# Patient Record
Sex: Female | Born: 1950 | Race: White | Hispanic: No | Smoking: Former smoker
Health system: Southern US, Community
[De-identification: ages and names within clinical notes are randomized; demographics above are authoritative.]

## PROBLEM LIST (undated history)

## (undated) DIAGNOSIS — B019 Varicella without complication: Secondary | ICD-10-CM

## (undated) DIAGNOSIS — N289 Disorder of kidney and ureter, unspecified: Secondary | ICD-10-CM

## (undated) DIAGNOSIS — F32A Depression, unspecified: Secondary | ICD-10-CM

## (undated) DIAGNOSIS — I480 Paroxysmal atrial fibrillation: Secondary | ICD-10-CM

## (undated) DIAGNOSIS — I10 Essential (primary) hypertension: Secondary | ICD-10-CM

## (undated) DIAGNOSIS — F419 Anxiety disorder, unspecified: Secondary | ICD-10-CM

## (undated) DIAGNOSIS — F319 Bipolar disorder, unspecified: Secondary | ICD-10-CM

## (undated) DIAGNOSIS — Z923 Personal history of irradiation: Secondary | ICD-10-CM

## (undated) HISTORY — DX: Varicella without complication: B01.9

## (undated) HISTORY — DX: Paroxysmal atrial fibrillation: I48.0

## (undated) HISTORY — DX: Essential (primary) hypertension: I10

## (undated) HISTORY — DX: Disorder of kidney and ureter, unspecified: N28.9

## (undated) HISTORY — DX: Bipolar disorder, unspecified: F31.9

## (undated) HISTORY — DX: Depression, unspecified: F32.A

---

## 1973-11-27 HISTORY — PX: TONSILLECTOMY AND ADENOIDECTOMY: SUR1326

## 2000-01-20 ENCOUNTER — Encounter: Payer: Self-pay | Admitting: Family Medicine

## 2000-01-20 ENCOUNTER — Encounter: Admission: RE | Admit: 2000-01-20 | Discharge: 2000-01-20 | Payer: Self-pay | Admitting: Family Medicine

## 2000-06-11 ENCOUNTER — Encounter: Payer: Self-pay | Admitting: Urology

## 2000-06-11 ENCOUNTER — Ambulatory Visit (HOSPITAL_COMMUNITY): Admission: RE | Admit: 2000-06-11 | Discharge: 2000-06-11 | Payer: Self-pay | Admitting: Urology

## 2000-07-06 ENCOUNTER — Other Ambulatory Visit: Admission: RE | Admit: 2000-07-06 | Discharge: 2000-07-06 | Payer: Self-pay | Admitting: Family Medicine

## 2000-07-16 ENCOUNTER — Encounter: Admission: RE | Admit: 2000-07-16 | Discharge: 2000-07-16 | Payer: Self-pay | Admitting: Family Medicine

## 2000-07-16 ENCOUNTER — Encounter: Payer: Self-pay | Admitting: Family Medicine

## 2001-02-11 ENCOUNTER — Encounter: Admission: RE | Admit: 2001-02-11 | Discharge: 2001-02-26 | Payer: Self-pay | Admitting: Occupational Medicine

## 2001-02-17 ENCOUNTER — Ambulatory Visit (HOSPITAL_COMMUNITY): Admission: RE | Admit: 2001-02-17 | Discharge: 2001-02-17 | Payer: Self-pay | Admitting: Neurosurgery

## 2001-02-17 ENCOUNTER — Encounter: Payer: Self-pay | Admitting: Neurosurgery

## 2001-03-11 ENCOUNTER — Encounter: Payer: Self-pay | Admitting: Neurosurgery

## 2001-03-11 ENCOUNTER — Encounter: Admission: RE | Admit: 2001-03-11 | Discharge: 2001-03-11 | Payer: Self-pay | Admitting: Neurosurgery

## 2001-03-27 ENCOUNTER — Encounter: Payer: Self-pay | Admitting: Neurosurgery

## 2001-03-27 ENCOUNTER — Encounter: Admission: RE | Admit: 2001-03-27 | Discharge: 2001-03-27 | Payer: Self-pay | Admitting: Neurosurgery

## 2001-05-21 ENCOUNTER — Encounter: Payer: Self-pay | Admitting: Neurosurgery

## 2001-05-21 ENCOUNTER — Encounter: Admission: RE | Admit: 2001-05-21 | Discharge: 2001-05-21 | Payer: Self-pay | Admitting: Neurosurgery

## 2001-09-20 ENCOUNTER — Other Ambulatory Visit: Admission: RE | Admit: 2001-09-20 | Discharge: 2001-09-20 | Payer: Self-pay | Admitting: Family Medicine

## 2001-10-01 ENCOUNTER — Encounter: Admission: RE | Admit: 2001-10-01 | Discharge: 2001-10-01 | Payer: Self-pay | Admitting: Family Medicine

## 2001-10-01 ENCOUNTER — Encounter: Payer: Self-pay | Admitting: Family Medicine

## 2003-03-03 ENCOUNTER — Encounter: Payer: Self-pay | Admitting: Neurosurgery

## 2003-03-03 ENCOUNTER — Encounter: Admission: RE | Admit: 2003-03-03 | Discharge: 2003-03-03 | Payer: Self-pay | Admitting: Neurosurgery

## 2003-11-18 ENCOUNTER — Encounter: Admission: RE | Admit: 2003-11-18 | Discharge: 2003-11-18 | Payer: Self-pay | Admitting: Neurosurgery

## 2004-05-22 ENCOUNTER — Ambulatory Visit (HOSPITAL_COMMUNITY): Admission: RE | Admit: 2004-05-22 | Discharge: 2004-05-22 | Payer: Self-pay | Admitting: Neurosurgery

## 2004-05-27 ENCOUNTER — Ambulatory Visit (HOSPITAL_COMMUNITY): Admission: RE | Admit: 2004-05-27 | Discharge: 2004-05-27 | Payer: Self-pay | Admitting: Neurosurgery

## 2004-10-31 ENCOUNTER — Ambulatory Visit (HOSPITAL_BASED_OUTPATIENT_CLINIC_OR_DEPARTMENT_OTHER): Admission: RE | Admit: 2004-10-31 | Discharge: 2004-10-31 | Payer: Self-pay | Admitting: Podiatry

## 2005-11-27 HISTORY — PX: BUNIONECTOMY: SHX129

## 2006-06-04 ENCOUNTER — Ambulatory Visit: Payer: Self-pay | Admitting: Internal Medicine

## 2006-06-21 ENCOUNTER — Ambulatory Visit: Payer: Self-pay | Admitting: Internal Medicine

## 2007-03-07 ENCOUNTER — Encounter: Admission: RE | Admit: 2007-03-07 | Discharge: 2007-03-07 | Payer: Self-pay | Admitting: Neurosurgery

## 2013-06-17 ENCOUNTER — Ambulatory Visit (INDEPENDENT_AMBULATORY_CARE_PROVIDER_SITE_OTHER): Payer: BC Managed Care – PPO | Admitting: Family Medicine

## 2013-06-17 ENCOUNTER — Encounter: Payer: Self-pay | Admitting: Family Medicine

## 2013-06-17 VITALS — BP 124/82 | Temp 98.1°F | Ht 63.75 in | Wt 164.0 lb

## 2013-06-17 DIAGNOSIS — Z7689 Persons encountering health services in other specified circumstances: Secondary | ICD-10-CM

## 2013-06-17 DIAGNOSIS — Z7189 Other specified counseling: Secondary | ICD-10-CM

## 2013-06-17 DIAGNOSIS — F319 Bipolar disorder, unspecified: Secondary | ICD-10-CM

## 2013-06-17 DIAGNOSIS — Z23 Encounter for immunization: Secondary | ICD-10-CM

## 2013-06-17 DIAGNOSIS — S83209A Unspecified tear of unspecified meniscus, current injury, unspecified knee, initial encounter: Secondary | ICD-10-CM

## 2013-06-17 DIAGNOSIS — Z1211 Encounter for screening for malignant neoplasm of colon: Secondary | ICD-10-CM

## 2013-06-17 DIAGNOSIS — R1900 Intra-abdominal and pelvic swelling, mass and lump, unspecified site: Secondary | ICD-10-CM

## 2013-06-17 DIAGNOSIS — IMO0002 Reserved for concepts with insufficient information to code with codable children: Secondary | ICD-10-CM

## 2013-06-17 NOTE — Patient Instructions (Signed)
-  We have ordered labs or studies at this visit. It can take up to 1-2 weeks for results and processing. We will contact you with instructions IF your results are abnormal. Normal results will be released to your [facility]. If you have not heard from [place] or can not find your results in [facility] in 2 weeks please contact our office.  -PLEASE SIGN UP FOR MYCHART TODAY   We recommend the following healthy lifestyle measures: - eat a healthy diet consisting of lots of vegetables, fruits, beans, nuts, seeds, healthy meats such as white chicken and fish and whole grains.  - avoid fried foods, fast food, processed foods, sodas, red meet and other fattening foods.  - get a least 150 minutes of aerobic exercise per week.   -We placed a referral for you as discussed for your colonoscopy. It usually takes about 1-2 weeks to process and schedule this referral. If you have not heard from [place] regarding this appointment in 2 weeks please contact our office.  Please call [phone] to schedule your mammogram  Follow up in: next 1-2 months for a physical exam, pap smear, breast exam and fasting labs

## 2013-06-17 NOTE — Progress Notes (Signed)
Chief Complaint  Patient presents with  . Establish Care  . mass in left lower quadrant    HPI:  Margaret Mckenzie is here to establish care. No insurance for several years so lost touch with prior PCP. She has a history of HTN but this resolved with exercise and smoking cessation. Remote hx of kidney stones requiring lithotripsy. Psych issues follow by Dr. Tomasa Rand. Last PCP and physical: 2009   Has the following chronic problems and concerns today:  Bipolar Disorder: -follow by Dr. Tomasa Rand with [facility] whom prescribes all of her pscyh medications and checks her labs -on depakote, lamictal, sonata and xanax - had recent CBC and LFT all ok on 04/11/14 -she has been very stable and is aware of potentially serious interactions regarding her medication - she take sleep medication and xanax rarely -no pscyh hospitalizations or suicidal attempts in the past  R Medial Meniscal tear: -followed by [facility] -considering surgery  Bump in LLQ: -has had this for 4-5 years -small bump in LLQ a few times a week - disappears when pushes on it -has not changed, does not hurt -denies: abd pain, weight loss, fevers, bowel changes, diarrhea, melena, hematochezia, hernia that she is aware of or abd surgeries -she is not worried about it  There are no active problems to display for this patient.  Health Maintenance: -postmenopausal, no bleeding -needs health maintenance exam -had colonoscopy 10 years ago -out of date on pap and mammos -needs tetanus today -she wants to check on shingles cost  ROS: See pertinent positives and negatives per HPI.  Past Medical History  Diagnosis Date  . Chicken pox   . Depression   . Arrhythmia   . Hypertension     high blood pressure readings   . Bipolar disorder   . Kidney disease     kidney stones and saw urologist, Dr. Dillard Cannon for lithotripsy remotely     Family History  Problem Relation Age of Onset  . Osteoporosis Mother    . Stroke Mother     TIA, [place]  . Mental illness Mother   . Hypertension Father   . Heart disease Father 58  . Stroke Father 56  . Mental illness Sister     History   Social History  . Marital Status: Married    Spouse Name: N/A    Number of Children: N/A  . Years of Education: N/A   Social History Main Topics  . Smoking status: Former Smoker    Types: Cigarettes    Quit date: 04/09/2012  . Smokeless tobacco: None  . Alcohol Use: Yes     Comment: 1 glass of wine a night   . Drug Use: None  . Sexually Active: None   Other Topics Concern  . 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 Take 1?2 to 1 tablet by mouth twice daily as needed., Disp: , Rfl: ;  divalproex (DEPAKOTE) 500 MG DR tablet, Take 500 mg by mouth. Take 4 tablets by mouth twice daily., Disp: , Rfl: ;  lamoTRIgine (LAMICTAL) 150 MG tablet, Take 150 mg  by mouth at bedtime., Disp: , Rfl:  zaleplon (SONATA) 10 MG capsule, Take 10 mg by mouth. Take 1/2 to 1 capsule by mouth daily at bedtime., Disp: , Rfl:   EXAM:  Filed Vitals:   06/17/13 1425  BP: 124/82  Temp: 98.1 F (36.7 C)    Body mass index is 28.38 kg/(m^2).  GENERAL: vitals reviewed and listed above, alert, oriented, appears well hydrated and in no acute distress  HEENT: atraumatic, conjunttiva clear, no obvious abnormalities on inspection of external nose and ears  NECK: no obvious masses on inspection  LUNGS: clear to auscultation bilaterally, no wheezes, rales or rhonchi, good air movement  CV: HRRR, no peripheral edema  ABD: BS+, soft, NTTP, no appreciable masses or hernia  MS: moves all extremities without noticeable  abnormality  PSYCH: pleasant and cooperative, no obvious depression or anxiety  ASSESSMENT AND PLAN:  Discussed the following assessment and plan:  Need for prophylactic vaccination with combined diphtheria-tetanus-pertussis (DTP) vaccine - Plan: Tdap vaccine greater than or equal to 7yo IM  Bipolar disorder, unspecified - managed by Dr. Tomasa Rand  meniscal tear - followed by [facility]  Lump in the abdomen  Encounter to establish care  Colon cancer screening - Plan: Ambulatory referral to Gastroenterology  -We reviewed the PMH, PSH, FH, SH, Meds and Allergies. -We provided refills for any medications we will prescribe as needed. -We addressed current concerns per orders and patient instructions. -We have asked for records for pertinent exams, studies, vaccines and notes from previous providers. -We have advised patient to follow up per instructions below. -tdap vaccine given today -reviewed labs and placed in scan box - will do lipids and diabetes screening at physical per pt wishes -discuss lump in abd - sounds like possible hernia but nothing found on exam, advised referral to surgery to evaluate - she prefers to hold off on this for now and will let us know if desires to pursue further, return and emergency precautions discussed -referred for colonoscopy -follow up for physical, pap, labs  45 minutes spent with this patient  -Patient advised to return or notify a doctor immediately if symptoms worsen or persist or new concerns arise.  Patient Instructions  -We have ordered labs or studies at this visit. It can take up to 1-2 weeks for results and processing. We will contact you with instructions IF your results are abnormal. Normal results will be released to your [facility]. If you have not heard from [place] or can not find your results in [facility] in 2 weeks please contact our office.  -PLEASE SIGN UP FOR MYCHART TODAY   We recommend the following healthy lifestyle  measures: - eat a healthy diet consisting of lots of vegetables, fruits, beans, nuts, seeds, healthy meats such as white chicken and fish and whole grains.  - avoid fried foods, fast food, processed foods, sodas, red meet and other fattening foods.  - get a least 150 minutes of aerobic exercise per week.   -We placed a referral for you as discussed for your colonoscopy. It usually takes about 1-2 weeks to process and schedule this referral. If you have not heard from [place] regarding this appointment in 2 weeks please contact our office.  Please call [phone] to schedule your mammogram  Follow up in: next 1-2 months for a physical exam, pap smear, breast exam and fasting labs      KIM, HANNAH R.

## 2013-06-23 ENCOUNTER — Encounter: Payer: Self-pay | Admitting: Family Medicine

## 2013-06-23 NOTE — Progress Notes (Signed)
Some records including normal colonoscopy report from 2007 received. Placed in scan box.

## 2013-07-03 ENCOUNTER — Encounter: Payer: Self-pay | Admitting: Internal Medicine

## 2013-07-09 ENCOUNTER — Other Ambulatory Visit: Payer: Self-pay

## 2013-07-09 DIAGNOSIS — Z1231 Encounter for screening mammogram for malignant neoplasm of breast: Secondary | ICD-10-CM

## 2013-07-30 ENCOUNTER — Ambulatory Visit
Admission: RE | Admit: 2013-07-30 | Discharge: 2013-07-30 | Disposition: A | Discharge status: Home / Self Care | Payer: BC Managed Care – PPO | Source: Ambulatory Visit

## 2013-07-30 DIAGNOSIS — Z1231 Encounter for screening mammogram for malignant neoplasm of breast: Secondary | ICD-10-CM

## 2013-08-04 ENCOUNTER — Other Ambulatory Visit (HOSPITAL_COMMUNITY)
Admission: RE | Admit: 2013-08-04 | Discharge: 2013-08-04 | Disposition: A | Discharge status: Home / Self Care | Payer: BC Managed Care – PPO | Source: Ambulatory Visit | Attending: Family Medicine | Admitting: Family Medicine

## 2013-08-04 ENCOUNTER — Encounter: Payer: Self-pay | Admitting: Family Medicine

## 2013-08-04 ENCOUNTER — Ambulatory Visit (INDEPENDENT_AMBULATORY_CARE_PROVIDER_SITE_OTHER): Payer: BC Managed Care – PPO | Admitting: Family Medicine

## 2013-08-04 ENCOUNTER — Other Ambulatory Visit: Payer: Self-pay | Admitting: Family Medicine

## 2013-08-04 ENCOUNTER — Ambulatory Visit (INDEPENDENT_AMBULATORY_CARE_PROVIDER_SITE_OTHER): Payer: BC Managed Care – PPO

## 2013-08-04 VITALS — BP 120/88 | Temp 98.5°F | Ht 63.75 in | Wt 163.0 lb

## 2013-08-04 DIAGNOSIS — Z1151 Encounter for screening for human papillomavirus (HPV): Secondary | ICD-10-CM | POA: Insufficient documentation

## 2013-08-04 DIAGNOSIS — Z0000 Encounter for general adult medical examination without abnormal findings: Secondary | ICD-10-CM

## 2013-08-04 DIAGNOSIS — M25512 Pain in left shoulder: Secondary | ICD-10-CM

## 2013-08-04 DIAGNOSIS — F319 Bipolar disorder, unspecified: Secondary | ICD-10-CM

## 2013-08-04 DIAGNOSIS — Z01419 Encounter for gynecological examination (general) (routine) without abnormal findings: Secondary | ICD-10-CM | POA: Insufficient documentation

## 2013-08-04 DIAGNOSIS — Z23 Encounter for immunization: Secondary | ICD-10-CM

## 2013-08-04 DIAGNOSIS — M25519 Pain in unspecified shoulder: Secondary | ICD-10-CM

## 2013-08-04 DIAGNOSIS — R928 Other abnormal and inconclusive findings on diagnostic imaging of breast: Secondary | ICD-10-CM

## 2013-08-04 LAB — LDL CHOLESTEROL, DIRECT: Direct LDL: 58.8 mg/dL

## 2013-08-04 LAB — LIPID PANEL
Cholesterol: 168 mg/dL (ref 0–200)
HDL: 70.9 mg/dL (ref >39.00)
Total CHOL/HDL Ratio: 2
Triglycerides: 343 mg/dL — ABNORMAL HIGH (ref 0.0–149.0)
VLDL: 68.6 mg/dL — ABNORMAL HIGH (ref 0.0–40.0)

## 2013-08-04 LAB — HEMOGLOBIN A1C: Hgb A1c MFr Bld: 5.4 % (ref 4.6–6.5)

## 2013-08-04 NOTE — Progress Notes (Signed)
Chief Complaint  Patient presents with  . Annual Exam    HPI:  Here for CPE:  -Concerns today:  1)PSych: bipolar -followed by pscyh - had labs in May-stable  2)lump in abd: -intermittent, likely hernia - advised surgery eval but she decided to hold off on this -has occ then disappears - again refuses referral  3)? Reaction to tetanus shot: -had quite a bit of soreness after shot in shoulder, now improved -pain in acromio-clavicular jt now - denies weakness, numbness, radiation, fevers, chills  -Diet: variety of foods, balance and well rounded, larger portion sizes  -calcium/vitamin D: no  -Exercise: no regular exercise - seeing Dr. Farris Has, doing PT  -Diabetes and Dyslipidemia Screening: fasting today for labs  -Hx of HTN: no  -Vaccines: needs flu vaccine  -pap history: has been years  - will do today, never had abnormal pap  -FDLMP: postmenopausal, no bleeding  -sexual activity: no  -wants STI testing, Hep C screening: no  -FH breast, colon or ovarian ca: see FH -referred for colonoscopy last visit - had in 2007, due in 2017 -had mammo 07/09/13 with birads 0 - recalled for additional imaging - breast center contacted patient and she is set up for additional images per her report  -Alcohol, Tobacco, drug use: see social history  Review of Systems - Review of Systems  Constitutional: Negative for fever, weight loss and malaise/fatigue.  HENT: Negative for hearing loss.   Eyes: Negative for blurred vision.  Respiratory: Negative for cough and shortness of breath.   Cardiovascular: Negative for chest pain, palpitations and leg swelling.  Skin: Negative for rash.  GU: no bleeding or dysuria GI: no change in bowels or melena or blood in stools -PSych: stable, no SI   Past Medical History  Diagnosis Date  . Chicken pox   . Depression   . Arrhythmia   . Hypertension     high blood pressure readings   . Bipolar disorder   . Kidney disease     kidney stones  and saw urologist, Dr. Dillard Cannon for lithotripsy remotely     Past Surgical History  Procedure Laterality Date  . Tonsillectomy and adenoidectomy  1975  . Bunionectomy  2007    Family History  Problem Relation Age of Onset  . Osteoporosis Mother   . Stroke Mother     TIA, [place]  . Mental illness Mother   . Hypertension Father   . Heart disease Father 61  . Stroke Father 35  . Mental illness Sister     History   Social History  . Marital Status: Married    Spouse Name: N/A    Number of Children: N/A  . Years of Education: N/A   Social History Main Topics  . Smoking status: Former Smoker    Types: Cigarettes    Quit date: 04/09/2012  . Smokeless tobacco: None  . Alcohol Use: Yes     Comment: 1 glass of wine a night   . Drug Use: None  . Sexual Activity: None   Other Topics Concern  . 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 Take 1?2 to 1 tablet by mouth twice daily as needed., Disp: , Rfl: ;  divalproex (DEPAKOTE) 500 MG DR tablet, Take 500 mg by mouth. Take 4 tablets by mouth twice daily., Disp: , Rfl: ;  lamoTRIgine (LAMICTAL) 150 MG tablet, Take 150 mg by mouth at bedtime., Disp: , Rfl:  zaleplon (SONATA) 10 MG capsule, Take 10 mg by mouth. Take 1/2 to 1 capsule by mouth daily at bedtime., Disp: , Rfl:   EXAM:  Filed Vitals:   08/04/13 0933  BP: 120/88  Temp: 98.5 F (36.9 C)    GENERAL: vitals reviewed and listed below, alert, oriented, appears well hydrated and in no acute distress  HEENT: head atraumatic, PERRLA, normal appearance of eyes, ears, nose and mouth. moist mucus membranes.  NECK: supple, no masses or  lymphadenopathy  LUNGS: clear to auscultation bilaterally, no rales, rhonchi or wheeze  CV: HRRR, no peripheral edema or cyanosis, normal pedal pulses  BREAST: refused  ABDOMEN: bowel sounds normal, soft, non tender to palpation, no masses, no rebound or guarding  GU: normal appearance of external genitalia - no lesions or masses, normal vaginal mucosa - no abnormal discharge, normal appearance of cervix - no lesions or abnormal discharge, no masses or tenderness on palpation of uterus and ovaries.  RECTAL: refused  SKIN: no rash or abnormal lesions  MS: normal gait, moves all extremities normally - TTP L AC joint, + shawl sign ortherwise normal shoulder exam  NEURO: CN II-XII grossly intact, normal muscle strength and sensation to light touch on extremities  PSYCH: normal affect, pleasant and cooperative  ASSESSMENT AND PLAN:  Discussed the following assessment and plan:  Visit for preventive health examination - Plan: Lipid panel, Hemoglobin A1c  Need for prophylactic vaccination and inoculation against influenza - Plan: Flu Vaccine QUAD 36+ mos PF IM (Fluarix)  Pain in joint, shoulder region, left -offered ortho referral, prefers conservative care since improving and will follow up if persists in 4 weeks  Bipolar disorder, unspecified   -Discussed and advised all [place] preventive services health task force level A and B recommendations for age, sex and risks.  -Advised at least 150 minutes of exercise per week and a healthy diet low in saturated fats and sweets and consisting of fresh fruits and vegetables, lean meats such as fish and white chicken and whole grains.  -labs, studies and vaccines per orders this encounter  Orders Placed This Encounter  Procedures  . Flu Vaccine QUAD 36+ mos PF IM (Fluarix)  . Lipid panel  . Hemoglobin A1c    Patient Instructions  -Vitamin D 1000 IU Vit 3 daily  -1200mg  daily total in diet and supplement  -check on cost of shingles  vaccine and set up nurse visit if you decide to do this  -get repeat mammogram   -gave you the flu vaccine today  -We have ordered labs or studies at this visit. It can take up to 1-2 weeks for results and processing. We will contact you with instructions IF your results are abnormal. Normal results will be released to your [facility]. If you have not heard from [place] or can not find your results in [facility] in 2 weeks please contact our office.  -We recommend the following healthy lifestyle measures: - eat a healthy diet consisting of lots of vegetables, fruits, beans, nuts, seeds, healthy meats such as white chicken and fish and whole grains.  - avoid fried foods, fast food, processed foods, sodas, red meet and  other fattening foods.  - get a least 150 minutes of aerobic exercise per week.   Follow up in 6 months or as needed           Patient advised to return to clinic immediately if symptoms worsen or persist or new concerns.  @LIFEPLAN @  Return in about 6 months (around 02/01/2014).  Kriste Basque R.

## 2013-08-04 NOTE — Patient Instructions (Addendum)
-  Vitamin D 1000 IU Vit 3 daily  -1200mg  daily total in diet and supplement  -check on cost of shingles vaccine and set up nurse visit if you decide to do this  -get repeat mammogram   -gave you the flu vaccine today  -We have ordered labs or studies at this visit. It can take up to 1-2 weeks for results and processing. We will contact you with instructions IF your results are abnormal. Normal results will be released to your [facility]. If you have not heard from [place] or can not find your results in [facility] in 2 weeks please contact our office.  -We recommend the following healthy lifestyle measures: - eat a healthy diet consisting of lots of vegetables, fruits, beans, nuts, seeds, healthy meats such as white chicken and fish and whole grains.  - avoid fried foods, fast food, processed foods, sodas, red meet and other fattening foods.  - get a least 150 minutes of aerobic exercise per week.   Follow up in 6 months or as needed

## 2013-08-04 NOTE — Progress Notes (Signed)
Quick Note:  Called and spoke with pt and pt is aware. ______ 

## 2013-08-04 NOTE — Progress Notes (Signed)
Quick Note:  Left a message for pt to return call. ______ 

## 2013-08-06 NOTE — Progress Notes (Signed)
Quick Note:  Left a message for pt at desiganted cell phone number. ______

## 2013-08-11 ENCOUNTER — Ambulatory Visit
Admission: RE | Admit: 2013-08-11 | Discharge: 2013-08-11 | Disposition: A | Discharge status: Home / Self Care | Payer: BC Managed Care – PPO | Source: Ambulatory Visit | Attending: Family Medicine | Admitting: Family Medicine

## 2013-08-11 DIAGNOSIS — R928 Other abnormal and inconclusive findings on diagnostic imaging of breast: Secondary | ICD-10-CM

## 2013-08-26 NOTE — Progress Notes (Signed)
Quick Note:  Left a message for return call. ______ 

## 2013-08-28 NOTE — Progress Notes (Signed)
Left a message for pt to return call 

## 2013-09-01 NOTE — Progress Notes (Signed)
Quick Note:  Called and spoke with pt and pt states she is set up for both! ______

## 2016-06-27 ENCOUNTER — Encounter: Payer: Self-pay | Admitting: Internal Medicine

## 2017-06-04 DIAGNOSIS — Z79899 Other long term (current) drug therapy: Secondary | ICD-10-CM | POA: Diagnosis not present

## 2017-06-04 LAB — BASIC METABOLIC PANEL
BUN: 15 (ref 4–21)
Creatinine: 0.7 (ref <=1.1)
Glucose: 92
Potassium: 4.4 (ref 3.4–5.3)
Sodium: 141 (ref 137–147)

## 2017-06-04 LAB — LIPID PANEL
Cholesterol: 220 — AB (ref 0–200)
HDL: 112 — AB (ref 35–70)
LDL Cholesterol: 92
Triglycerides: 82 (ref 40–160)

## 2017-06-04 LAB — HEMOGLOBIN A1C: Hemoglobin A1C: 5

## 2017-06-11 ENCOUNTER — Ambulatory Visit: Payer: Self-pay | Admitting: Family Medicine

## 2017-06-11 ENCOUNTER — Emergency Department (HOSPITAL_COMMUNITY)
Admission: EM | Admit: 2017-06-11 | Discharge: 2017-06-11 | Disposition: A | Discharge status: Home / Self Care | Payer: PPO | Attending: Emergency Medicine | Admitting: Emergency Medicine

## 2017-06-11 ENCOUNTER — Ambulatory Visit (INDEPENDENT_AMBULATORY_CARE_PROVIDER_SITE_OTHER): Payer: PPO | Admitting: Family Medicine

## 2017-06-11 ENCOUNTER — Encounter: Payer: Self-pay | Admitting: Family Medicine

## 2017-06-11 VITALS — BP 116/80 | HR 121 | Temp 98.6°F | Ht 64.0 in | Wt 138.1 lb

## 2017-06-11 DIAGNOSIS — R42 Dizziness and giddiness: Secondary | ICD-10-CM | POA: Diagnosis not present

## 2017-06-11 DIAGNOSIS — I10 Essential (primary) hypertension: Secondary | ICD-10-CM | POA: Insufficient documentation

## 2017-06-11 DIAGNOSIS — I4892 Unspecified atrial flutter: Secondary | ICD-10-CM | POA: Insufficient documentation

## 2017-06-11 DIAGNOSIS — R002 Palpitations: Secondary | ICD-10-CM | POA: Diagnosis not present

## 2017-06-11 DIAGNOSIS — Z716 Tobacco abuse counseling: Secondary | ICD-10-CM

## 2017-06-11 DIAGNOSIS — R009 Unspecified abnormalities of heart beat: Secondary | ICD-10-CM | POA: Diagnosis present

## 2017-06-11 DIAGNOSIS — F17210 Nicotine dependence, cigarettes, uncomplicated: Secondary | ICD-10-CM | POA: Diagnosis not present

## 2017-06-11 DIAGNOSIS — Z79899 Other long term (current) drug therapy: Secondary | ICD-10-CM | POA: Insufficient documentation

## 2017-06-11 DIAGNOSIS — I499 Cardiac arrhythmia, unspecified: Secondary | ICD-10-CM

## 2017-06-11 LAB — CBC
HCT: 44.6 % (ref 36.0–46.0)
Hemoglobin: 15.1 g/dL — ABNORMAL HIGH (ref 12.0–15.0)
MCH: 30.8 pg (ref 26.0–34.0)
MCHC: 33.9 g/dL (ref 30.0–36.0)
MCV: 90.8 fL (ref 78.0–100.0)
Platelets: 221 10*3/uL (ref 150–400)
RBC: 4.91 MIL/uL (ref 3.87–5.11)
RDW: 14.1 % (ref 11.5–15.5)
WBC: 7.9 10*3/uL (ref 4.0–10.5)

## 2017-06-11 LAB — BASIC METABOLIC PANEL
Anion gap: 10 (ref 5–15)
BUN: 13 mg/dL (ref 6–20)
CO2: 27 mmol/L (ref 22–32)
Calcium: 10.2 mg/dL (ref 8.9–10.3)
Chloride: 106 mmol/L (ref 101–111)
Creatinine, Ser: 0.73 mg/dL (ref 0.44–1.00)
GFR calc Af Amer: >60 mL/min (ref >60)
GFR calc non Af Amer: >60 mL/min (ref >60)
Glucose, Bld: 89 mg/dL (ref 65–99)
Potassium: 3.9 mmol/L (ref 3.5–5.1)
Sodium: 143 mmol/L (ref 135–145)

## 2017-06-11 LAB — MAGNESIUM: Magnesium: 2.2 mg/dL (ref 1.7–2.4)

## 2017-06-11 MED ORDER — APIXABAN 5 MG PO TABS: 5.0000 mg | ORAL_TABLET | Freq: Two times a day (BID) | ORAL | 0 refills | Status: DC

## 2017-06-11 NOTE — ED Provider Notes (Signed)
[facility] DEPT Provider Note   CSN: [id number] Arrival date & time: 06/11/17  1420     History   Chief Complaint Chief Complaint  Patient presents with  . Irregular Heart Beat    HPI Margaret Mckenzie is a 66 y.o. female.  HPI  66 year old female presents from her doctor's office with a new diagnosis of paroxysmal A. fib versus a flutter. Patient went to her PCP who she had not seen in 4 years for blood work and a checkup. She was noted to be currently in an irregularly irregular heart rate of around 120. Patient states that she can feel herself having these palpitations and this is been going on for about 3 months. Happens about once a week for about an hour at a time. Very rarely she has been dizzy during these episodes and otherwise has no symptoms such as chest pain, chest pressure, or shortness of breath. She otherwise has not been ill and has had no recent vomiting, diarrhea, weight loss, or neck pain or swelling. She is currently a symptomatically feels like she is out of A. Fib. She states she used to have high blood pressure but does not have anymore and has never had a history of CHF, stroke/TIA, diabetes, or vascular problems  Past Medical History:  Diagnosis Date  . Arrhythmia   . Bipolar 1 disorder ([place])   . Bipolar disorder ([place])   . Chicken pox   . Depression   . Hypertension    high blood pressure readings   . Kidney disease    kidney stones and saw urologist, Dr. Edward Qualia for lithotripsy remotely     There are no active problems to display for this patient.   Past Surgical History:  Procedure Laterality Date  . BUNIONECTOMY  2007  . TONSILLECTOMY AND ADENOIDECTOMY  1975    OB History    No data available       Home Medications    Prior to Admission medications   Medication Sig Start Date End Date Taking? Authorizing Provider  ALPRAZolam Duanne Moron) 0.25 MG tablet Take 0.25 mg by mouth daily as needed for anxiety or sleep.    Yes Rosamaria Lints, MD  lamoTRIgine (LAMICTAL) 150 MG tablet Take 300 mg by mouth at bedtime.    Yes Rosamaria Lints, MD  Magnesium 500 MG TABS Take 1 tablet by mouth at bedtime.   Yes [provider]  risperiDONE (RISPERDAL) 3 MG tablet Take 3 mg by mouth at bedtime.   Yes [provider]  zaleplon (SONATA) 10 MG capsule Take 10 mg by mouth at bedtime as needed for sleep.    Yes Rosamaria Lints, MD  apixaban (ELIQUIS) 5 MG TABS tablet Take 1 tablet (5 mg total) by mouth 2 (two) times daily. 06/11/17   Sherwood Gambler, MD    Family History Family History  Problem Relation Age of Onset  . Osteoporosis Mother   . Stroke Mother        TIA, stoke  . Mental retardation Mother   . Hypertension Father   . Heart disease Father 71  . Stroke Father 43  . Mental illness Sister     Social History Social History  Substance Use Topics  . Smoking status: Current Every Day Smoker    Packs/day: 0.50    Types: Cigarettes    Last attempt to quit: 04/09/2012  . Smokeless tobacco: Never Used     Comment: started smoking again 2 years  .  Alcohol use Yes     Comment: 1 glass of wine a night      Allergies   Patient has no known allergies.   Review of Systems Review of Systems  Constitutional: Negative for unexpected weight change.  Respiratory: Negative for shortness of breath.   Cardiovascular: Positive for palpitations. Negative for chest pain.  Gastrointestinal: Negative for diarrhea and vomiting.  Musculoskeletal: Negative for neck pain.  Neurological: Negative for dizziness.  All other systems reviewed and are negative.    Physical Exam Updated Vital Signs BP (!) 145/92 (BP Location: Left Arm)   Pulse 84   Temp 99.2 F (37.3 C) (Oral)   Resp 14   Ht 5\' 4"  (1.626 m)   Wt 62.1 kg (137 lb)   SpO2 97%   BMI 23.52 kg/m   Physical Exam  Constitutional: She is oriented to person, place, and time. She appears well-developed and well-nourished. No distress.  HENT:    Head: Normocephalic and atraumatic.  Right Ear: External ear normal.  Left Ear: External ear normal.  Nose: Nose normal.  Mouth/Throat: Oropharynx is clear and moist.  Eyes: Right eye exhibits no discharge. Left eye exhibits no discharge.  Neck: Neck supple. No thyromegaly present.  Cardiovascular: Normal rate, regular rhythm and normal heart sounds.   No murmur heard. Pulmonary/Chest: Effort normal and breath sounds normal.  Abdominal: Soft. There is no tenderness.  Neurological: She is alert and oriented to person, place, and time.  Skin: Skin is warm and dry. She is not diaphoretic.  Nursing note and vitals reviewed.    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  Procedures Procedures (including critical care time)  Medications Ordered in ED Medications - No data to display   Initial Impression / Assessment and Plan / ED Course  I have reviewed the triage vital signs and the nursing notes.  Pertinent labs & imaging results that were available during my care of the patient were reviewed by me and considered in my medical decision making (see chart for details).     Patient is currently symptomatically, no A. fib or a flutter while here. Her EKG from her PCP appears to be more a flutter. Either way, her  chadsvasc is a 2. I discussed anticoagulation and she agrees and understands risks and benefits. Otherwise she has not had any type of anginal symptom or other concerning signs/symptoms. She will be referred to cardiology and also follow-up with PCP. Discussed return precautions.  This patients CHA2DS2-VASc Score and unadjusted Ischemic Stroke Rate (% per year) is equal to 2.2 % stroke rate/year from a score of 2  Above score calculated as 1 point each if present [CHF, HTN, DM, Vascular=MI/PAD/Aortic Plaque, Age if 65-74, or Female] Above score calculated as  2 points each if present [Age > 75, or Stroke/TIA/TE]  CHA2DS2/VAS Stroke Risk Points     2 >= 2 Points: High Risk  1 - 1.99 Points: Medium Risk  0 Points: Low Risk     This score determines the patient's risk of having a stroke if the  patient has atrial fibrillation.          Final Clinical Impressions(s) / ED Diagnoses   Final diagnoses:  Paroxysmal atrial flutter (HCC)    New Prescriptions New Prescriptions   APIXABAN (ELIQUIS) 5 MG TABS TABLET    Take 1 tablet (5 mg total) by mouth 2 (two) times daily.     Sherwood Gambler, MD 06/11/17 [phone]

## 2017-06-11 NOTE — Progress Notes (Signed)
HPI:  Not seen in 4 years, here to reestablish.  On exam irr and fast heartbeat. Admits intermittent palpitations, chest discomfort and presyncopal symptoms on and off recently. Currently admits to palpitations when asked.  Bipolar disorder: -sees psychiatrist  -sees Donnal Moat, NP - [facility] -had labs last week, good except mildly elevated cholesterol  [facility] - [facility]. Health team advantage.   Due for physical, many preventive measures - she now has insurance and plans to do her colonosocpy and mammogram and agrees to schedule.   ROS: See pertinent positives and negatives per HPI.  Past Medical History:  Diagnosis Date  . Arrhythmia   . Bipolar 1 disorder ([place])   . Bipolar disorder ([place])   . Chicken pox   . Depression   . Hypertension    high blood pressure readings   . Kidney disease    kidney stones and saw urologist, Dr. Edward Qualia for lithotripsy remotely     Past Surgical History:  Procedure Laterality Date  . BUNIONECTOMY  2007  . TONSILLECTOMY AND ADENOIDECTOMY  1975    Family History  Problem Relation Age of Onset  . Osteoporosis Mother   . Stroke Mother        TIA, [place]  . Mental retardation Mother   . Hypertension Father   . Heart disease Father 41  . Stroke Father 48  . Mental illness Sister     Social History   Social History  . Marital status: Married    Spouse name: N/A  . Number of children: N/A  . Years of education: N/A   Social History Main Topics  . Smoking status: Current Every Day Smoker    Packs/day: 0.50    Types: Cigarettes    Last attempt to quit: 04/09/2012  . Smokeless tobacco: Never Used     Comment: started smoking again 2 years  . Alcohol use Yes     Comment: 1 glass of wine a night   . Drug use: Unknown  . Sexual activity: Not Asked   Other Topics Concern  . 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  ALPRAZolam (XANAX) 0.25 MG tablet, Take 0.25 mg by mouth daily as needed for anxiety or sleep. , Disp: , Rfl:  .  lamoTRIgine (LAMICTAL) 150 MG tablet, Take 300 mg by mouth at bedtime. , Disp: , Rfl:  .  risperiDONE (RISPERDAL) 3 MG tablet, Take 3 mg by mouth at bedtime., Disp: , Rfl:  .  zaleplon (SONATA) 10 MG capsule, Take 10 mg by mouth at bedtime as needed for sleep. , Disp: , Rfl:  .  apixaban (ELIQUIS) 5 MG TABS tablet, Take 1 tablet (5 mg total) by mouth 2 (two) times daily., Disp: 60 tablet, Rfl: 0 .  Magnesium 500 MG TABS, Take 1 tablet by mouth at bedtime., Disp: , Rfl:   EXAM:  Vitals:   06/11/17 1314  BP: 116/80  Pulse: (!) 121  Temp: 98.6 F (37 C)    Body mass index is 23.7 kg/m.  GENERAL: vitals reviewed and listed above, alert, oriented, appears well hydrated and in  no acute distress  HEENT: atraumatic, conjunttiva clear, no obvious abnormalities on inspection of external nose and ears  NECK: no obvious masses on inspection  LUNGS: clear to auscultation bilaterally, no wheezes, rales or rhonchi, good air movement  CV: irr irr, tachy, bilat tr LE edema  MS: moves all extremities without noticeable abnormality  PSYCH: pleasant and cooperative, no obvious depression or anxiety  ASSESSMENT AND PLAN:  Discussed the following assessment and plan:  Palpitations - Plan: EKG 12-Lead  Irregular heart beat - Plan: EKG 12-Lead  Dizziness - Plan: EKG 12-Lead  Tobacco abuse counseling  -irr rapid HR on exam -reports feeling palpitations here now, no CP, SOB, dizziness currently - admits to intermittent presyncopal symptoms recently -a. Fib/flutter on EKG, rate in 120-130s, advised eval in ER for underlying causes and management, advised [place]  -she refused EMS transport -  assistant to notify ER -will need CPE/preventive visit once acute issues controlled -tobacco cessation counseling 3-5 minutes, advised to quit, offered help  -Patient advised to return or notify a doctor immediately if symptoms worsen or persist or new concerns arise.  Patient Instructions  BEFORE YOU LEAVE: -prevnar 13 -follow up: CPE/Medicare exam with Dr. Maudie Mercury in 3 months - come fasting, we will plan pap smear as well, 45 minutes or end of morning clinic  Quit smoking! You can do it.   We recommend the following healthy lifestyle for LIFE: 1) Small portions.   Tip: eat off of a salad plate instead of a dinner plate.  Tip: It is ok to feel hungry after a meal - that likely means you ate an appropriate portion.  Tip: if you need more or a snack choose fruits, veggies and/or a handful of nuts or seeds.  2) Eat a healthy clean diet.   TRY TO EAT: -at least 5-7 servings of low sugar vegetables per day (not corn, potatoes or bananas.) -berries are the best choice if you wish to eat fruit.   -lean meets (fish, chicken or [place] breasts) -vegan proteins for some meals - beans or tofu, whole grains, nuts and seeds -Replace bad fats with good fats - good fats include: fish, nuts and seeds, canola oil, olive oil -small amounts of low fat or non fat dairy -small amounts of100 % whole grains - check the lables  AVOID: -SUGAR, sweets, anything with added sugar, corn syrup or sweeteners -if you must have a sweetener, small amounts of stevia may be best -sweetened beverages -simple starches (rice, bread, potatoes, pasta, chips, etc - small amounts of 100% whole grains are ok) -red meat, pork, butter -fried foods, fast food, processed food, excessive dairy, eggs and coconut.  3)Get at least 150 minutes of sweaty aerobic exercise per week.  4)Reduce stress - consider counseling, meditation and relaxation to balance other aspects of your life.      Colin Benton R., DO

## 2017-06-11 NOTE — Patient Instructions (Addendum)
BEFORE YOU LEAVE: -prevnar 13 -follow up: CPE/Medicare exam with Dr. Maudie Mercury in 3 months - come fasting, we will plan pap smear as well, 45 minutes or end of morning clinic  Quit smoking! You can do it.   We recommend the following healthy lifestyle for LIFE: 1) Small portions.   Tip: eat off of a salad plate instead of a dinner plate.  Tip: It is ok to feel hungry after a meal - that likely means you ate an appropriate portion.  Tip: if you need more or a snack choose fruits, veggies and/or a handful of nuts or seeds.  2) Eat a healthy clean diet.   TRY TO EAT: -at least 5-7 servings of low sugar vegetables per day (not corn, potatoes or bananas.) -berries are the best choice if you wish to eat fruit.   -lean meets (fish, chicken or [place] breasts) -vegan proteins for some meals - beans or tofu, whole grains, nuts and seeds -Replace bad fats with good fats - good fats include: fish, nuts and seeds, canola oil, olive oil -small amounts of low fat or non fat dairy -small amounts of100 % whole grains - check the lables  AVOID: -SUGAR, sweets, anything with added sugar, corn syrup or sweeteners -if you must have a sweetener, small amounts of stevia may be best -sweetened beverages -simple starches (rice, bread, potatoes, pasta, chips, etc - small amounts of 100% whole grains are ok) -red meat, pork, butter -fried foods, fast food, processed food, excessive dairy, eggs and coconut.  3)Get at least 150 minutes of sweaty aerobic exercise per week.  4)Reduce stress - consider counseling, meditation and relaxation to balance other aspects of your life.

## 2017-06-11 NOTE — ED Notes (Signed)
ED Provider at bedside. 

## 2017-06-11 NOTE — ED Triage Notes (Signed)
Pt states that x 4-5 months she has episodes where she can feel her heart beating irregular and she was sent in today after her MD found her to be in a-fib. No hx of same. NSR now. Alert and oriented.

## 2017-06-12 ENCOUNTER — Encounter: Payer: Self-pay | Admitting: Family Medicine

## 2017-06-18 ENCOUNTER — Ambulatory Visit (HOSPITAL_COMMUNITY)
Admission: RE | Admit: 2017-06-18 | Discharge: 2017-06-18 | Disposition: A | Discharge status: Home / Self Care | Payer: PPO | Source: Ambulatory Visit | Attending: Nurse Practitioner | Admitting: Nurse Practitioner

## 2017-06-18 ENCOUNTER — Encounter (HOSPITAL_COMMUNITY): Payer: Self-pay | Admitting: Nurse Practitioner

## 2017-06-18 VITALS — BP 142/86 | HR 85 | Ht 64.0 in | Wt 136.4 lb

## 2017-06-18 DIAGNOSIS — F319 Bipolar disorder, unspecified: Secondary | ICD-10-CM | POA: Insufficient documentation

## 2017-06-18 DIAGNOSIS — Z7902 Long term (current) use of antithrombotics/antiplatelets: Secondary | ICD-10-CM | POA: Insufficient documentation

## 2017-06-18 DIAGNOSIS — Z818 Family history of other mental and behavioral disorders: Secondary | ICD-10-CM | POA: Diagnosis not present

## 2017-06-18 DIAGNOSIS — R030 Elevated blood-pressure reading, without diagnosis of hypertension: Secondary | ICD-10-CM | POA: Insufficient documentation

## 2017-06-18 DIAGNOSIS — N289 Disorder of kidney and ureter, unspecified: Secondary | ICD-10-CM | POA: Insufficient documentation

## 2017-06-18 DIAGNOSIS — I429 Cardiomyopathy, unspecified: Secondary | ICD-10-CM | POA: Diagnosis not present

## 2017-06-18 DIAGNOSIS — Z823 Family history of stroke: Secondary | ICD-10-CM | POA: Diagnosis not present

## 2017-06-18 DIAGNOSIS — Z9889 Other specified postprocedural states: Secondary | ICD-10-CM | POA: Insufficient documentation

## 2017-06-18 DIAGNOSIS — Z8262 Family history of osteoporosis: Secondary | ICD-10-CM | POA: Insufficient documentation

## 2017-06-18 DIAGNOSIS — I480 Paroxysmal atrial fibrillation: Secondary | ICD-10-CM | POA: Diagnosis not present

## 2017-06-18 DIAGNOSIS — Z8249 Family history of ischemic heart disease and other diseases of the circulatory system: Secondary | ICD-10-CM | POA: Insufficient documentation

## 2017-06-18 DIAGNOSIS — F17210 Nicotine dependence, cigarettes, uncomplicated: Secondary | ICD-10-CM | POA: Insufficient documentation

## 2017-06-18 DIAGNOSIS — Z8619 Personal history of other infectious and parasitic diseases: Secondary | ICD-10-CM | POA: Insufficient documentation

## 2017-06-18 DIAGNOSIS — I4891 Unspecified atrial fibrillation: Secondary | ICD-10-CM | POA: Diagnosis not present

## 2017-06-18 DIAGNOSIS — Z87442 Personal history of urinary calculi: Secondary | ICD-10-CM | POA: Insufficient documentation

## 2017-06-18 LAB — TSH: TSH: 1.406 u[IU]/mL (ref 0.350–4.500)

## 2017-06-18 MED ORDER — APIXABAN 5 MG PO TABS: 5.0000 mg | ORAL_TABLET | Freq: Two times a day (BID) | ORAL | 6 refills | Status: DC

## 2017-06-18 MED ORDER — DILTIAZEM HCL 30 MG PO TABS: ORAL_TABLET | ORAL | 1 refills | Status: DC

## 2017-06-18 NOTE — Patient Instructions (Signed)
Your physician has recommended you make the following change in your medication:  1)Cardizem 30mg -- take 1 tablet every 4 hours AS NEEDED for AFIB heart rate >100  

## 2017-06-18 NOTE — Progress Notes (Signed)
Primary Care Physician: [name], DO Referring Physician: [facility] ER f/u   Margaret Mckenzie is a 66 y.o. female with a h/o new onset afib diagnosed when she presented at PCP for a physical. She was referred on to the ER and was in [place] there. She is also in SR in the clinic. She was aware of some palpitations, mild shortness of breath and chest discomfort at times in the past. No awareness since ER visit.   She does smoke and has cut down since afib dx. Also drinks a glass of wine at  night. Denies snoring. No excessive caffeine use. Gets some exercise and is not overweight.  Has been placed on eliquis 5 mg bid. No bleeding issues.  Today, she denies symptoms of palpitations, chest pain, shortness of breath, orthopnea, PND, lower extremity edema, dizziness, presyncope, syncope, or neurologic sequela. The patient is tolerating medications without difficulties and is otherwise without complaint today.   Past Medical History:  Diagnosis Date  . Arrhythmia   . Bipolar 1 disorder ([place])   . Bipolar disorder ([place])   . Chicken pox   . Depression   . Hypertension    high blood pressure readings   . Kidney disease    kidney stones and saw urologist, Dr. Edward Qualia for lithotripsy remotely    Past Surgical History:  Procedure Laterality Date  . BUNIONECTOMY  2007  . TONSILLECTOMY AND ADENOIDECTOMY  1975    Current Outpatient Prescriptions  Medication Sig Dispense Refill  . ALPRAZolam (XANAX) 0.25 MG tablet Take 0.25 mg by mouth daily as needed for anxiety or sleep.     Marland Kitchen apixaban (ELIQUIS) 5 MG TABS tablet Take 1 tablet (5 mg total) by mouth 2 (two) times daily. 60 tablet 6  . lamoTRIgine (LAMICTAL) 150 MG tablet Take 300 mg by mouth at bedtime.     . Magnesium 500 MG TABS Take 1 tablet by mouth at bedtime.    . risperiDONE (RISPERDAL) 3 MG tablet Take 3 mg by mouth at bedtime.    . zaleplon (SONATA) 10 MG capsule Take 10 mg by mouth at bedtime as needed for sleep.     Marland Kitchen diltiazem  (CARDIZEM) 30 MG tablet Take 1 tablet every 4 hours AS NEEDED for AFIB heart rate >100 45 tablet 1   No current facility-administered medications for this encounter.     No Known Allergies  Social History   Social History  . Marital status: Married    Spouse name: N/A  . Number of children: N/A  . Years of education: N/A   Occupational History  . Not on file.   Social History Main Topics  . Smoking status: Current Every Day Smoker    Packs/day: 0.50    Types: Cigarettes    Last attempt to quit: 04/09/2012  . Smokeless tobacco: Never Used     Comment: started smoking again 2 years  . Alcohol use Yes     Comment: 1 glass of wine a night   . Drug use: Unknown  . Sexual activity: Not on file   Other Topics Concern  . 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 Osteoporosis Mother   . Stroke Mother        TIA, [place]  . Mental retardation Mother   . Hypertension Father   . Heart disease Father 75  . Stroke Father 28  . Mental illness Sister     ROS- All systems are reviewed and negative except as per the HPI above  Physical Exam: Vitals:   06/18/17 1332  BP: (!) 142/86  Pulse: 85  Weight: 136 lb 6.4 oz (61.9 kg)  Height: 5\' 4"  (1.626 m)   Wt Readings from Last 3 Encounters:  06/18/17 136 lb 6.4 oz (61.9 kg)  06/11/17 137 lb (62.1 kg)  06/11/17 138 lb 1.6 oz (62.6 kg)    Labs: Lab Results  Component Value Date   NA 143 06/11/2017   K 3.9 06/11/2017   CL 106 06/11/2017   CO2 27 06/11/2017   GLUCOSE 89 06/11/2017   BUN 13 06/11/2017   CREATININE 0.73 06/11/2017   CALCIUM 10.2 06/11/2017   MG 2.2 06/11/2017   No results found for: INR Lab Results  Component Value Date     CHOL 220 (A) 06/04/2017   HDL 112 (A) 06/04/2017   LDLCALC 92 06/04/2017   TRIG 82 06/04/2017     GEN- The patient is well appearing, alert and oriented x 3 today.   Head- normocephalic, atraumatic Eyes-  Sclera clear, conjunctiva pink Ears- hearing intact Oropharynx- clear Neck- supple, no JVP Lymph- no cervical lymphadenopathy Lungs- Clear to ausculation bilaterally, normal work of breathing Heart- Regular rate and rhythm, no murmurs, rubs or gallops, PMI not laterally displaced GI- soft, NT, ND, + BS Extremities- no clubbing, cyanosis, or edema MS- no significant deformity or atrophy Skin- no rash or lesion Psych- euthymic mood, full affect Neuro- strength and sensation are intact  EKG- NSR at 85 bpm, Pr int 150 ms, qrs int 92 ms, qtc 437 ms Epic records reviewed    Assessment and Plan: 1. New onset afib General education re afib Bleeding precautions with eliquis also discussed, denies bleeding history Tobacco cessation encouraged Decrease alcohol intake to 2 servings a week Continue eliquis 5 mg bid for a chadsvasc score of 3(female, age, htn) Echo  Will rx cardizem 30 mg as needed for afib since afib seems quiet for now, may have to work into daily rate control if afib burden increases tsh today  F/u in one month  Butch Penny C. Avanell Banwart, [facility] [address] [place],  [address] [phone]

## 2017-06-26 ENCOUNTER — Ambulatory Visit (HOSPITAL_COMMUNITY)
Admission: RE | Admit: 2017-06-26 | Discharge: 2017-06-26 | Disposition: A | Discharge status: Home / Self Care | Payer: PPO | Source: Ambulatory Visit | Attending: Nurse Practitioner | Admitting: Nurse Practitioner

## 2017-06-26 DIAGNOSIS — I480 Paroxysmal atrial fibrillation: Secondary | ICD-10-CM | POA: Insufficient documentation

## 2017-06-26 DIAGNOSIS — F17210 Nicotine dependence, cigarettes, uncomplicated: Secondary | ICD-10-CM | POA: Insufficient documentation

## 2017-06-26 NOTE — Progress Notes (Signed)
  Echocardiogram 2D Echocardiogram has been performed.  Bobbye Charleston 06/26/2017, 3:49 PM

## 2017-07-19 ENCOUNTER — Encounter (HOSPITAL_COMMUNITY): Payer: Self-pay | Admitting: Nurse Practitioner

## 2017-07-19 ENCOUNTER — Ambulatory Visit (HOSPITAL_COMMUNITY)
Admission: RE | Admit: 2017-07-19 | Discharge: 2017-07-19 | Disposition: A | Discharge status: Home / Self Care | Payer: PPO | Source: Ambulatory Visit | Attending: Nurse Practitioner | Admitting: Nurse Practitioner

## 2017-07-19 VITALS — BP 152/90 | HR 69 | Ht 64.0 in | Wt 136.0 lb

## 2017-07-19 DIAGNOSIS — Z823 Family history of stroke: Secondary | ICD-10-CM | POA: Insufficient documentation

## 2017-07-19 DIAGNOSIS — Z87891 Personal history of nicotine dependence: Secondary | ICD-10-CM | POA: Diagnosis not present

## 2017-07-19 DIAGNOSIS — I480 Paroxysmal atrial fibrillation: Secondary | ICD-10-CM | POA: Diagnosis not present

## 2017-07-19 DIAGNOSIS — F319 Bipolar disorder, unspecified: Secondary | ICD-10-CM | POA: Diagnosis not present

## 2017-07-19 DIAGNOSIS — Z7901 Long term (current) use of anticoagulants: Secondary | ICD-10-CM | POA: Diagnosis not present

## 2017-07-19 DIAGNOSIS — I10 Essential (primary) hypertension: Secondary | ICD-10-CM | POA: Diagnosis not present

## 2017-07-19 DIAGNOSIS — Z79899 Other long term (current) drug therapy: Secondary | ICD-10-CM | POA: Insufficient documentation

## 2017-07-19 DIAGNOSIS — Z8249 Family history of ischemic heart disease and other diseases of the circulatory system: Secondary | ICD-10-CM | POA: Diagnosis not present

## 2017-07-19 DIAGNOSIS — I4891 Unspecified atrial fibrillation: Secondary | ICD-10-CM | POA: Diagnosis not present

## 2017-07-19 LAB — CBC
HEMATOCRIT: 40.5 % (ref 36.0–46.0)
Hemoglobin: 13.3 g/dL (ref 12.0–15.0)
MCH: 29.8 pg (ref 26.0–34.0)
MCHC: 32.8 g/dL (ref 30.0–36.0)
MCV: 90.8 fL (ref 78.0–100.0)
Platelets: 204 10*3/uL (ref 150–400)
RBC: 4.46 MIL/uL (ref 3.87–5.11)
RDW: 13.6 % (ref 11.5–15.5)
WBC: 7.4 10*3/uL (ref 4.0–10.5)

## 2017-07-19 NOTE — Progress Notes (Signed)
Primary Care Physician: [name], DO Referring Physician: [facility] ER f/u   Margaret Mckenzie is a 66 y.o. female with a h/o new onset afib diagnosed when she presented at PCP for a physical. She was referred on to the ER and was in [place] there. She is also in SR in the clinic. She was aware of some palpitations, mild shortness of breath and chest discomfort at times in the past. No awareness since ER visit.   She does smoke and has cut down since afib dx. Also drinks a glass of wine at  night. Denies snoring. No excessive caffeine use. Gets some exercise and is not overweight.  Has been placed on eliquis 5 mg bid. No bleeding issues.  F/u in afib clinic 8/23. She has noted a few episodes of irregular heart beat. Nothing sustained or worrisome to her. We did discuss daily BB or CCB if worsens  but she does not want to take daily meds for these episodes yet. No bleeding issues with the eliquis. She has stopped smoking and cut her alcohol way down! Echo showed mod LVH,normal systolic function.  Today, she denies symptoms of palpitations, chest pain, shortness of breath, orthopnea, PND, lower extremity edema, dizziness, presyncope, syncope, or neurologic sequela. The patient is tolerating medications without difficulties and is otherwise without complaint today.   Past Medical History:  Diagnosis Date  . Arrhythmia   . Bipolar 1 disorder ([place])   . Bipolar disorder ([place])   . Chicken pox   . Depression   . Hypertension    high blood pressure readings   . Kidney disease    kidney stones and saw urologist, Dr. Edward Qualia for lithotripsy remotely    Past Surgical History:  Procedure Laterality Date  . BUNIONECTOMY  2007  . TONSILLECTOMY AND ADENOIDECTOMY  1975    Current Outpatient Prescriptions  Medication Sig Dispense Refill  . ALPRAZolam (XANAX) 0.25 MG tablet Take 0.25 mg by mouth daily as needed for anxiety or sleep.     Marland Kitchen apixaban (ELIQUIS) 5 MG TABS tablet Take 1 tablet (5 mg  total) by mouth 2 (two) times daily. 60 tablet 6  . diltiazem (CARDIZEM) 30 MG tablet Take 1 tablet every 4 hours AS NEEDED for AFIB heart rate >100 45 tablet 1  . lamoTRIgine (LAMICTAL) 150 MG tablet Take 300 mg by mouth at bedtime.     . Magnesium 500 MG TABS Take 1 tablet by mouth at bedtime.    . risperiDONE (RISPERDAL) 3 MG tablet Take 3 mg by mouth at bedtime.    . zaleplon (SONATA) 10 MG capsule Take 10 mg by mouth at bedtime as needed for sleep.      No current facility-administered medications for this encounter.     No Known Allergies  Social History   Social History  . Marital status: Divorced    Spouse name: N/A  . Number of children: N/A  . Years of education: N/A   Occupational History  . Not on file.   Social History Main Topics  . Smoking status: Former Smoker    Packs/day: 0.50    Types: Cigarettes    Quit date: 04/09/2012  . Smokeless tobacco: Never Used     Comment: pt has quit smoking  . Alcohol use Yes     Comment: 1 glass of wine a night   . Drug use: Unknown  . Sexual activity: Not on file   Other Topics Concern  . 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 Osteoporosis Mother   . Stroke Mother        TIA, [place]  . Mental retardation Mother   . Hypertension Father   . Heart disease Father 18  . Stroke Father 60  . Mental illness Sister     ROS- All systems are reviewed and negative except as per the HPI above  Physical Exam: Vitals:   07/19/17 1358  BP: (!) 152/90  Pulse: 69  Weight: 136 lb (61.7 kg)  Height: 5\' 4"  (1.626 m)   Wt Readings from Last 3 Encounters:  07/19/17 136 lb (61.7 kg)  06/18/17 136 lb 6.4 oz (61.9 kg)  06/11/17 137 lb (62.1 kg)     Labs: Lab Results  Component Value Date   NA 143 06/11/2017   K 3.9 06/11/2017   CL 106 06/11/2017   CO2 27 06/11/2017   GLUCOSE 89 06/11/2017   BUN 13 06/11/2017   CREATININE 0.73 06/11/2017   CALCIUM 10.2 06/11/2017   MG 2.2 06/11/2017   No results found for: INR Lab Results  Component Value Date   CHOL 220 (A) 06/04/2017   HDL 112 (A) 06/04/2017   LDLCALC 92 06/04/2017   TRIG 82 06/04/2017     GEN- The patient is well appearing, alert and oriented x 3 today.   Head- normocephalic, atraumatic Eyes-  Sclera clear, conjunctiva pink Ears- hearing intact Oropharynx- clear Neck- supple, no JVP Lymph- no cervical lymphadenopathy Lungs- Clear to ausculation bilaterally, normal work of breathing Heart- Regular rate and rhythm, no murmurs, rubs or gallops, PMI not laterally displaced GI- soft, NT, ND, + BS Extremities- no clubbing, cyanosis, or edema MS- no significant deformity or atrophy Skin- no rash or lesion Psych- euthymic mood, full affect Neuro- strength and sensation are intact  EKG- NSR at 69 bpm, Pr int 128 ms, qrs int 98 ms, qtc 441 ms Epic records reviewed Echo-Study Conclusions  - Left ventricle: The cavity size was normal. Wall thickness was   increased in a pattern of moderate LVH. Systolic function was   normal. The estimated ejection fraction was in the range of 55%   to 60%. Wall motion was normal; there were no regional wall   motion abnormalities. Doppler parameters are consistent with   abnormal left ventricular relaxation (grade 1 diastolic   dysfunction). - Mitral valve: Calcified annulus.  Impressions:  - Normal LV systolic function; moderate LVH; mild diastolic   dysfunction; trace MR and TR.   Assessment and Plan: 1. New onset afib Currently with very little burden Bleeding precautions with eliquis  reciewed Continue eliquis 5 mg bid for a chadsvasc score of 3(female, age, htn) CBC today Continue to use cardizem 30 mg as  needed for afib, since afib seems quiet for now, may have to work into daily rate control if afib burden increases    F/u here as needed Establish with general cardiology in 3-4 months PCP as scheduled  Butch Penny C. Alecxis Baltzell, [facility] [address] [place], [place] [address] [phone]

## 2017-08-16 ENCOUNTER — Encounter: Payer: Self-pay | Admitting: Family Medicine

## 2017-08-17 DIAGNOSIS — F3181 Bipolar II disorder: Secondary | ICD-10-CM | POA: Diagnosis not present

## 2017-08-20 ENCOUNTER — Other Ambulatory Visit: Payer: Self-pay | Admitting: Family Medicine

## 2017-08-21 ENCOUNTER — Other Ambulatory Visit: Payer: Self-pay | Admitting: Family Medicine

## 2017-08-21 DIAGNOSIS — N6489 Other specified disorders of breast: Secondary | ICD-10-CM

## 2017-08-23 ENCOUNTER — Ambulatory Visit: Payer: PPO

## 2017-08-23 ENCOUNTER — Ambulatory Visit
Admission: RE | Admit: 2017-08-23 | Discharge: 2017-08-23 | Disposition: A | Discharge status: Home / Self Care | Payer: PPO | Source: Ambulatory Visit | Attending: Family Medicine | Admitting: Family Medicine

## 2017-08-23 DIAGNOSIS — N6489 Other specified disorders of breast: Secondary | ICD-10-CM

## 2017-08-23 DIAGNOSIS — R928 Other abnormal and inconclusive findings on diagnostic imaging of breast: Secondary | ICD-10-CM | POA: Diagnosis not present

## 2017-08-29 ENCOUNTER — Encounter: Payer: Self-pay | Admitting: Cardiology

## 2017-08-29 NOTE — Progress Notes (Signed)
Cardiology Office Note   Date:  08/31/2017   ID:  Margaret, Mckenzie 1951/09/16, MRN [id number]  PCP:  [name], DO  Cardiologist:   Minus Breeding, MD    Chief Complaint  Patient presents with  . Atrial Fibrillation      History of Present Illness: Margaret Mckenzie is a 66 y.o. female who presents for evaluation of atrial fib.  She was noted to be in this in her PCP office in July.  She as been seen a couple of times in the [facility].  She did have an echocardiogram and this demonstrated some LVH and was otherwise unremarkable.    She feels the palps rarely.  The patient denies any new symptoms such as chest discomfort, neck or arm discomfort. There has been no new shortness of breath, PND or orthopnea. There has been no presyncope or syncope.   She says when she gets the palpitations she might have to squat down or she feels a little lightheaded but this is rare. She has not needed any when necessary Cardizem.   Past Medical History:  Diagnosis Date  . Bipolar 1 disorder ([place])   . Chicken pox   . Hypertension    high blood pressure readings   . Kidney disease    kidney stones and saw urologist, Dr. Edward Qualia for lithotripsy remotely   . PAF (paroxysmal atrial fibrillation) ([place])     Past Surgical History:  Procedure Laterality Date  . BUNIONECTOMY  2007  . TONSILLECTOMY AND ADENOIDECTOMY  1975     Current Outpatient Prescriptions  Medication Sig Dispense Refill  . ALPRAZolam (XANAX) 0.25 MG tablet Take 0.25 mg by mouth daily as needed for anxiety or sleep.     Marland Kitchen apixaban (ELIQUIS) 5 MG TABS tablet Take 1 tablet (5 mg total) by mouth 2 (two) times daily. 60 tablet 6  . diltiazem (CARDIZEM) 30 MG tablet Take 1 tablet every 4 hours AS NEEDED for AFIB heart rate >100 45 tablet 1  . lamoTRIgine (LAMICTAL) 150 MG tablet Take 300 mg by mouth at bedtime.     . Magnesium 500 MG TABS Take 1 tablet by mouth at bedtime.    . risperiDONE (RISPERDAL) 3 MG  tablet Take 3 mg by mouth at bedtime.    . zaleplon (SONATA) 10 MG capsule Take 10 mg by mouth at bedtime as needed for sleep.      No current facility-administered medications for this visit.     Allergies:   Patient has no known allergies.    ROS:  Please see the history of present illness.   Otherwise, review of systems are positive for none.   All other systems are reviewed and negative.    PHYSICAL EXAM: VS:  BP (!) 166/86 (BP Location: Right Arm, Patient Position: Sitting, Cuff Size: Normal)   Pulse 70   Ht 5\' 4"  (1.626 m)   Wt 140 lb 6.4 oz (63.7 kg)   BMI 24.10 kg/m  , BMI Body mass index is 24.1 kg/m. GENERAL:  Well appearing NECK:  No jugular venous distention, waveform within normal limits, carotid upstroke brisk and symmetric, no bruits, no thyromegaly LUNGS:  Clear to auscultation bilaterally CHEST:  Unremarkable HEART:  PMI not displaced or sustained,S1 and S2 within normal limits, no S3, no S4, no clicks, no rubs, no murmurs ABD:  Flat, positive bowel sounds normal in frequency in pitch, no bruits, no rebound, no guarding, no midline pulsatile mass, no hepatomegaly,  no splenomegaly EXT:  2 plus pulses throughout, no edema, no cyanosis no clubbing    EKG:  EKG is ordered today. The ekg ordered today demonstrates sinus rhythm, rate 70, incomplete right bundle branch block, poor anterior R wave progression, no acute ST-T wave changes.   Recent Labs: 06/11/2017: BUN 13; Creatinine, Ser 0.73; Magnesium 2.2; Potassium 3.9; Sodium 143 06/18/2017: TSH 1.406 07/19/2017: Hemoglobin 13.3; Platelets 204    Lipid Panel    Component Value Date/Time   CHOL 220 (A) 06/04/2017   TRIG 82 06/04/2017   HDL 112 (A) 06/04/2017   CHOLHDL 2 08/04/2013 1102   VLDL 68.6 (H) 08/04/2013 1102   LDLCALC 92 06/04/2017   LDLDIRECT 58.8 08/04/2013 1102      Wt Readings from Last 3 Encounters:  08/30/17 140 lb 6.4 oz (63.7 kg)  07/19/17 136 lb (61.7 kg)  06/18/17 136 lb 6.4 oz (61.9  kg)      Other studies Reviewed: Additional studies/ records that were reviewed today include: [facility] Records. Review of the above records demonstrates:  Please see elsewhere in the note.     ASSESSMENT AND PLAN:   ATRIAL FIB.  [name] has a CHA2DS2 - VASc score of 3.  She has been treated with Eliquis and PRN Cardizem.   At this point the symptoms are not particularly problematic. No change in therapy is planned.  HTN:  The blood pressure is elevated today  I have asked her to get a BP monitor.  She might need to have med addition based on these readings.    .   Current medicines are reviewed at length with the patient today.  The patient does not have concerns regarding medicines.  The following changes have been made:  no change  Labs/ tests ordered today include:   Orders Placed This Encounter  Procedures  . EKG 12-Lead     Disposition:   FU with [facility] Fib Clinic.      Signed, Minus Breeding, MD  08/31/2017 8:14 AM    [place]

## 2017-08-30 ENCOUNTER — Ambulatory Visit (INDEPENDENT_AMBULATORY_CARE_PROVIDER_SITE_OTHER): Payer: PPO | Admitting: Cardiology

## 2017-08-30 ENCOUNTER — Encounter: Payer: Self-pay | Admitting: Cardiology

## 2017-08-30 VITALS — BP 166/86 | HR 70 | Ht 64.0 in | Wt 140.4 lb

## 2017-08-30 DIAGNOSIS — I480 Paroxysmal atrial fibrillation: Secondary | ICD-10-CM | POA: Diagnosis not present

## 2017-08-30 DIAGNOSIS — I10 Essential (primary) hypertension: Secondary | ICD-10-CM | POA: Diagnosis not present

## 2017-08-30 NOTE — Patient Instructions (Addendum)
Medication Instructions:  Continue current medications  If you need a refill on your cardiac medications before your next appointment, please call your pharmacy.  Labwork: None Ordered   Testing/Procedures: None Ordered  Follow-Up: Your physician wants you to follow-up in: 6 Months with Roderic Palau. You should receive a reminder letter in the mail two months in advance. If you do not receive a letter, please call our office [phone].   Thank you for choosing [facility] at [facility]!!

## 2017-08-31 ENCOUNTER — Encounter: Payer: Self-pay | Admitting: Cardiology

## 2017-08-31 DIAGNOSIS — I480 Paroxysmal atrial fibrillation: Secondary | ICD-10-CM | POA: Insufficient documentation

## 2017-08-31 DIAGNOSIS — I10 Essential (primary) hypertension: Secondary | ICD-10-CM | POA: Insufficient documentation

## 2017-09-02 NOTE — Progress Notes (Signed)
Medicare Annual Preventive Care Visit  (initial annual wellness or annual wellness exam)  Concerns and/or follow up today:  Hx bipolar d/o managed by psych and recent dx PAF (seeing cardiologist) here for CPE and wellness visit. She is doing well today. Her blood pressure is elevated on arrival, but she reports her cardiologist is monitoring this and had told her to watch it at home. She reports she has a long history of elevated blood pressure and was on blood pressure medications in the past. She does not remember which medication she took. She denies any known history of a reaction to any blood pressure medication.She is now on Elavil  for her atrial fibrillation. She was told to take diltiazem as needed. Denies chest pain, shortness of breath, headaches, vision changes, palpitations.  She had a colonoscopy about 11 years ago per her report that was normal. She reports she will call Dr. Carlean Purl and set up her colonoscopy.  See HM section in Epic for other details of completed HM. See scanned documentation under Media Tab for further documentation HPI, health risk assessment. See Media Tab and Care Teams sections in Epic for other providers.  ROS: negative for report of fevers, unintentional weight loss, vision changes, vision loss, hearing loss or change, chest pain, sob, hemoptysis, melena, hematochezia, hematuria, genital discharge or lesions, falls, bleeding or bruising, loc, thoughts of suicide or self harm, memory loss  1.) Patient-completed health risk assessment  - completed and reviewed, see scanned documentation  2.) Review of Medical History: -PMH, PSH, Family History and current specialty and care providers reviewed and updated and listed below  - see scanned in document in chart and below  Past Medical History:  Diagnosis Date  . Bipolar 1 disorder ([place])   . Chicken pox   . Hypertension    high blood pressure readings   . Kidney disease    kidney stones and saw urologist,  Dr. Edward Qualia for lithotripsy remotely   . PAF (paroxysmal atrial fibrillation) ([place])     Past Surgical History:  Procedure Laterality Date  . BUNIONECTOMY  2007  . TONSILLECTOMY AND ADENOIDECTOMY  1975    Social History   Social History  . Marital status: Divorced    Spouse name: N/A  . Number of children: N/A  . Years of education: N/A   Occupational History  . Not on file.   Social History Main Topics  . Smoking status: Former Smoker    Packs/day: 0.50    Types: Cigarettes    Quit date: 04/09/2012  . Smokeless tobacco: Never Used     Comment: pt has quit smoking  . Alcohol use Yes     Comment: 1 glass of wine a night   . Drug use: Unknown  . Sexual activity: Not on file   Other Topics Concern  . Not on file   Social History Narrative   Work or School: Educational psychologist at [employer] Situation: lives alone      Spiritual Beliefs: episcopalian      Lifestyle: had been going to Y but currently dealing with meniscal tear; diet good             Family History  Problem Relation Age of Onset  . Osteoporosis Mother   . Stroke Mother        TIA, [place]  . Mental retardation Mother   . Hypertension Father   . Heart disease Father 28  . Stroke Father 3  .  Mental illness Sister     Current Outpatient Prescriptions on File Prior to Visit  Medication Sig Dispense Refill  . ALPRAZolam (XANAX) 0.25 MG tablet Take 0.25 mg by mouth daily as needed for anxiety or sleep.     Marland Kitchen apixaban (ELIQUIS) 5 MG TABS tablet Take 1 tablet (5 mg total) by mouth 2 (two) times daily. 60 tablet 6  . diltiazem (CARDIZEM) 30 MG tablet Take 1 tablet every 4 hours AS NEEDED for AFIB heart rate >100 45 tablet 1  . lamoTRIgine (LAMICTAL) 150 MG tablet Take 300 mg by mouth at bedtime.     . Magnesium 500 MG TABS Take 1 tablet by mouth at bedtime.    . risperiDONE (RISPERDAL) 3 MG tablet Take 3 mg by mouth at bedtime.    . zaleplon (SONATA) 10 MG capsule Take 10 mg by  mouth at bedtime as needed for sleep.      No current facility-administered medications on file prior to visit.      3.) Review of functional ability and level of safety:  Any difficulty hearing?  See scanned documentation  History of falling?  See scanned documentation  Any trouble with IADLs - using a phone, using transportation, grocery shopping, preparing meals, doing housework, doing laundry, taking medications and managing money?  See scanned documentation  Advance Directives?  Discussed briefly and offered more resources and detailed discussion with our trained staff.   See summary of recommendations in Patient Instructions below.  4.) Physical Exam Vitals:   09/03/17 1213  BP: (!) 160/78  Pulse: 60   Estimated body mass index is 24.1 kg/m as calculated from the following:   Height as of 08/30/17: '5\' 4"'  (1.626 m).   Weight as of 08/30/17: 140 lb 6.4 oz (63.7 kg).  EKG (optional): deferred  General: alert, appear well hydrated and in no acute distress, normal ear canals, no sig abnormalities on inspections of oropharynx  HEENT: visual acuity grossly intact, PERRLA  CV: irr irr  Lungs: CTA bilaterally  ABD: BS+, soft, NTTP  BREAST: patient declined  GU: normal appearance of external genitalia - no lesions or masses appreciated, normal appearing vaginal mucosa - no abnormal discharge, normal appearance of cervix except stenotic - no lesions or abnormal discharge observed  RECTAL: deferred  Psych: pleasant and cooperative, no obvious depression or anxiety  Cognitive function grossly intact  See patient instructions for recommendations.  Education and counseling regarding the above review of health provided with a plan for the following: -see scanned patient completed form for further details -fall prevention strategies discussed  -healthy lifestyle discussed -importance and resources for completing advanced directives discussed -see patient instructions  below for any other recommendations provided  4)The following written screening schedule of preventive measures were reviewed with assessment and plan made per below, orders and patient instructions:      AAA screening done if applicable     Alcohol screening done     Obesity Screening and counseling done     STI screening (Hep C if born [birth year]) offered and per pt wishes     Tobacco Screening done done       Pneumococcal (PPSV23 -one dose after 64, one before if risk factors), influenza yearly and hepatitis B vaccines (if high risk - end stage renal disease, IV drugs, homosexual men, live in home for mentally retarded, hemophilia receiving factors) ASSESSMENT/PLAN: done today      Screening mammograph (yearly if >40) ASSESSMENT/PLAN: utd  Screening Pap smear/pelvic exam (q2 years) ASSESSMENT/PLAN: done today      Colorectal cancer screening (FOBT yearly or flex sig q4y or colonoscopy q10y or barium enema q4y) ASSESSMENT/PLAN: Pt reports done with Carlean Purl 11 years ago and agrees to call to schedule - reports was normal and was told 10 year repeat      Diabetes outpatient self-management training services ASSESSMENT/PLAN: utd or done      Bone mass measurements(covered q2y if indicated - estrogen def, osteoporosis, hyperparathyroid, vertebral abnormalities, osteoporosis or steroids) ASSESSMENT/PLAN: discussed and advised assistant to order      Screening for glaucoma(q1y if high risk - diabetes, FH, AA and > 50 or hispanic and > 65) ASSESSMENT/PLAN: utd or advised      Medical nutritional therapy for individuals with diabetes or renal disease ASSESSMENT/PLAN: see orders      Cardiovascular screening blood tests (lipids q5y) ASSESSMENT/PLAN: see orders and labs      Diabetes screening tests ASSESSMENT/PLAN: see orders and labs   7.) Summary:  Medicare annual wellness visit, subsequent -risk factors and conditions per above assessment were discussed and treatment,  recommendations and referrals were offered per documentation above and orders and patient instructions. -advanced directives with health coach RN Chandra Batch - see separate note)  Encounter for routine history and physical exam in female -preventive measures updated and reviewed per USPSTF -pap obtained  Paroxysmal atrial fibrillation ([place]) -seeing cardiology for management  Essential hypertension -reports elevated on several occassions and on BP meds in the past -opted to start treatment -discussed options and risks - we'll check BMP today and then consider either a diuretic or losartan -She has diltiazem and to use as needed from her cardiologist, however her rate is quite slow today so we are not sure that this would be the best first line treatment for her blood pressure  hepatitis C screening - Plan: Hepatitis C antibody  Estrogen deficiency - Plan: DG Bone Density  Patient Instructions   BEFORE YOU LEAVE: -Advanced directives folder or discussion with [place] visit paperwork -depression screening -Order bone density test at [facility] -Flu shot -follow up: yearly for annual wellness visit with Manuela Schwartz and physical with Dr. Maudie Mercury  Please call your cardiologist today and let them know your blood pressure remains elevated. See if it would be okay with them for you to take her diltiazem daily.  Please call your gastroenterologist today to schedule your colonoscopy.  We ordered her bone density test. Please let us know if you do not hear about this appointment in the next 1 week.  Please complete advanced directives.   Ms. Selke , Thank you for taking time to come for your Medicare Wellness Visit. I appreciate your ongoing commitment to your health goals. Please review the following plan we discussed and let me know if I can assist you in the future.   These are the goals we discussed: Goals    None      This is a list of the screening  recommended for you and due dates:  Health Maintenance  Topic Date Due  . Colon Cancer Screening  Patient to call and schedule  . DEXA scan (bone density measurement)  Referral placed  . HIV Screening  declined  . Pneumonia vaccines (2 of 2 - PPSV23) 09/03/2018  . Mammogram  08/24/2019  . Pap Smear  09/03/2020  . Tetanus Vaccine  06/18/2023  . Flu Shot  Completed today  .  Hepatitis C: One time  screening is recommended by Center for Disease Control  (CDC) for  adults born from [birth year] through 1964.   Completed today  *Topic was postponed. The date shown is not the original due date.    Health Maintenance for Postmenopausal Women Menopause is a normal process in which your reproductive ability comes to an end. This process happens gradually over a span of months to years, usually between the ages of 80 and 33. Menopause is complete when you have missed 12 consecutive menstrual periods. It is important to talk with your health care provider about some of the most common conditions that affect postmenopausal women, such as heart disease, cancer, and bone loss (osteoporosis). Adopting a healthy lifestyle and getting preventive care can help to promote your health and wellness. Those actions can also lower your chances of developing some of these common conditions. What should I know about menopause? During menopause, you may experience a number of symptoms, such as:  Moderate-to-severe hot flashes.  Night sweats.  Decrease in sex drive.  Mood swings.  Headaches.  Tiredness.  Irritability.  Memory problems.  Insomnia.  Choosing to treat or not to treat menopausal changes is an individual decision that you make with your health care provider. What should I know about hormone replacement therapy and supplements? Hormone therapy products are effective for treating symptoms that are associated with menopause, such as hot flashes and night sweats. Hormone replacement carries certain  risks, especially as you become older. If you are thinking about using estrogen or estrogen with progestin treatments, discuss the benefits and risks with your health care provider. What should I know about heart disease and stroke? Heart disease, heart attack, and stroke become more likely as you age. This may be due, in part, to the hormonal changes that your body experiences during menopause. These can affect how your body processes dietary fats, triglycerides, and cholesterol. Heart attack and stroke are both medical emergencies. There are many things that you can do to help prevent heart disease and stroke:  Have your blood pressure checked at least every 1-2 years. High blood pressure causes heart disease and increases the risk of stroke.  If you are 57-72 years old, ask your health care provider if you should take aspirin to prevent a heart attack or a stroke.  Do not use any tobacco products, including cigarettes, chewing tobacco, or electronic cigarettes. If you need help quitting, ask your health care provider.  It is important to eat a healthy diet and maintain a healthy weight. ? Be sure to include plenty of vegetables, fruits, low-fat dairy products, and lean protein. ? Avoid eating foods that are high in solid fats, added sugars, or salt (sodium).  Get regular exercise. This is one of the most important things that you can do for your health. ? Try to exercise for at least 150 minutes each week. The type of exercise that you do should increase your heart rate and make you sweat. This is known as moderate-intensity exercise. ? Try to do strengthening exercises at least twice each week. Do these in addition to the moderate-intensity exercise.  Know your numbers.Ask your health care provider to check your cholesterol and your blood glucose. Continue to have your blood tested as directed by your health care provider.  What should I know about cancer screening? There are several types  of cancer. Take the following steps to reduce your risk and to catch any cancer development as early as possible. Breast Cancer  Practice  breast self-awareness. ? This means understanding how your breasts normally appear and feel. ? It also means doing regular breast self-exams. Let your health care provider know about any changes, no matter how small.  If you are 80 or older, have a clinician do a breast exam (clinical breast exam or CBE) every year. Depending on your age, family history, and medical history, it may be recommended that you also have a yearly breast X-ray (mammogram).  If you have a family history of breast cancer, talk with your health care provider about genetic screening.  If you are at high risk for breast cancer, talk with your health care provider about having an MRI and a mammogram every year.  Breast cancer (BRCA) gene test is recommended for women who have family members with BRCA-related cancers. Results of the assessment will determine the need for genetic counseling and BRCA1 and for BRCA2 testing. BRCA-related cancers include these types: ? Breast. This occurs in males or females. ? Ovarian. ? Tubal. This may also be called fallopian tube cancer. ? Cancer of the abdominal or pelvic lining (peritoneal cancer). ? Prostate. ? Pancreatic.  Cervical, Uterine, and Ovarian Cancer Your health care provider may recommend that you be screened regularly for cancer of the pelvic organs. These include your ovaries, uterus, and vagina. This screening involves a pelvic exam, which includes checking for microscopic changes to the surface of your cervix (Pap test).  For women ages 21-65, health care providers may recommend a pelvic exam and a Pap test every three years. For women ages 53-65, they may recommend the Pap test and pelvic exam, combined with testing for human papilloma virus (HPV), every five years. Some types of HPV increase your risk of cervical cancer. Testing for  HPV may also be done on women of any age who have unclear Pap test results.  Other health care providers may not recommend any screening for nonpregnant women who are considered low risk for pelvic cancer and have no symptoms. Ask your health care provider if a screening pelvic exam is right for you.  If you have had past treatment for cervical cancer or a condition that could lead to cancer, you need Pap tests and screening for cancer for at least 20 years after your treatment. If Pap tests have been discontinued for you, your risk factors (such as having a new sexual partner) need to be reassessed to determine if you should start having screenings again. Some women have medical problems that increase the chance of getting cervical cancer. In these cases, your health care provider may recommend that you have screening and Pap tests more often.  If you have a family history of uterine cancer or ovarian cancer, talk with your health care provider about genetic screening.  If you have vaginal bleeding after reaching menopause, tell your health care provider.  There are currently no reliable tests available to screen for ovarian cancer.  Lung Cancer Lung cancer screening is recommended for adults 58-80 years old who are at high risk for lung cancer because of a history of smoking. A yearly low-dose CT scan of the lungs is recommended if you:  Currently smoke.  Have a history of at least 30 pack-years of smoking and you currently smoke or have quit within the past 15 years. A pack-year is smoking an average of one pack of cigarettes per day for one year.  Yearly screening should:  Continue until it has been 15 years since you quit.  Stop if  you develop a health problem that would prevent you from having lung cancer treatment.  Colorectal Cancer  This type of cancer can be detected and can often be prevented.  Routine colorectal cancer screening usually begins at age 57 and continues through  age 47.  If you have risk factors for colon cancer, your health care provider may recommend that you be screened at an earlier age.  If you have a family history of colorectal cancer, talk with your health care provider about genetic screening.  Your health care provider may also recommend using home test kits to check for hidden blood in your stool.  A small camera at the end of a tube can be used to examine your colon directly (sigmoidoscopy or colonoscopy). This is done to check for the earliest forms of colorectal cancer.  Direct examination of the colon should be repeated every 5-10 years until age 21. However, if early forms of precancerous polyps or small growths are found or if you have a family history or genetic risk for colorectal cancer, you may need to be screened more often.  Skin Cancer  Check your skin from head to toe regularly.  Monitor any moles. Be sure to tell your health care provider: ? About any new moles or changes in moles, especially if there is a change in a mole's shape or color. ? If you have a mole that is larger than the size of a pencil eraser.  If any of your family members has a history of skin cancer, especially at a young age, talk with your health care provider about genetic screening.  Always use sunscreen. Apply sunscreen liberally and repeatedly throughout the day.  Whenever you are outside, protect yourself by wearing long sleeves, pants, a wide-brimmed hat, and sunglasses.  What should I know about osteoporosis? Osteoporosis is a condition in which bone destruction happens more quickly than new bone creation. After menopause, you may be at an increased risk for osteoporosis. To help prevent osteoporosis or the bone fractures that can happen because of osteoporosis, the following is recommended:  If you are 66-26 years old, get at least 1,000 mg of calcium and at least 600 mg of vitamin D per day.  If you are older than age 77 but younger than  age 24, get at least 1,200 mg of calcium and at least 600 mg of vitamin D per day.  If you are older than age 25, get at least 1,200 mg of calcium and at least 800 mg of vitamin D per day.  Smoking and excessive alcohol intake increase the risk of osteoporosis. Eat foods that are rich in calcium and vitamin D, and do weight-bearing exercises several times each week as directed by your health care provider. What should I know about how menopause affects my mental health? Depression may occur at any age, but it is more common as you become older. Common symptoms of depression include:  Low or sad mood.  Changes in sleep patterns.  Changes in appetite or eating patterns.  Feeling an overall lack of motivation or enjoyment of activities that you previously enjoyed.  Frequent crying spells.  Talk with your health care provider if you think that you are experiencing depression. What should I know about immunizations? It is important that you get and maintain your immunizations. These include:  Tetanus, diphtheria, and pertussis (Tdap) booster vaccine.  Influenza every year before the flu season begins.  Pneumonia vaccine.  Shingles vaccine.  Your health care  provider may also recommend other immunizations. This information is not intended to replace advice given to you by your health care provider. Make sure you discuss any questions you have with your health care provider. Document Released: 01/05/2006 Document Revised: 06/02/2016 Document Reviewed: 08/17/2015 Elsevier Interactive Patient Education  2018 [employer]., DO

## 2017-09-03 ENCOUNTER — Other Ambulatory Visit (HOSPITAL_COMMUNITY)
Admission: RE | Admit: 2017-09-03 | Discharge: 2017-09-03 | Disposition: A | Discharge status: Home / Self Care | Payer: PPO | Source: Ambulatory Visit | Attending: Family Medicine | Admitting: Family Medicine

## 2017-09-03 ENCOUNTER — Ambulatory Visit (INDEPENDENT_AMBULATORY_CARE_PROVIDER_SITE_OTHER): Payer: PPO | Admitting: Family Medicine

## 2017-09-03 VITALS — BP 160/78 | HR 60

## 2017-09-03 DIAGNOSIS — I10 Essential (primary) hypertension: Secondary | ICD-10-CM | POA: Diagnosis not present

## 2017-09-03 DIAGNOSIS — Z0001 Encounter for general adult medical examination with abnormal findings: Secondary | ICD-10-CM | POA: Diagnosis not present

## 2017-09-03 DIAGNOSIS — Z0000 Encounter for general adult medical examination without abnormal findings: Secondary | ICD-10-CM | POA: Diagnosis not present

## 2017-09-03 DIAGNOSIS — Z7289 Other problems related to lifestyle: Secondary | ICD-10-CM

## 2017-09-03 DIAGNOSIS — I480 Paroxysmal atrial fibrillation: Secondary | ICD-10-CM

## 2017-09-03 DIAGNOSIS — Z124 Encounter for screening for malignant neoplasm of cervix: Secondary | ICD-10-CM

## 2017-09-03 DIAGNOSIS — Z23 Encounter for immunization: Secondary | ICD-10-CM | POA: Diagnosis not present

## 2017-09-03 DIAGNOSIS — Z01419 Encounter for gynecological examination (general) (routine) without abnormal findings: Secondary | ICD-10-CM | POA: Diagnosis not present

## 2017-09-03 DIAGNOSIS — E2839 Other primary ovarian failure: Secondary | ICD-10-CM

## 2017-09-03 LAB — BASIC METABOLIC PANEL
BUN: 12 mg/dL (ref 6–23)
CALCIUM: 9.7 mg/dL (ref 8.4–10.5)
CO2: 28 meq/L (ref 19–32)
CREATININE: 0.65 mg/dL (ref 0.40–1.20)
Chloride: 102 mEq/L (ref 96–112)
GFR: 97.12 mL/min (ref >60.00)
Glucose, Bld: 84 mg/dL (ref 70–99)
Potassium: 4.1 mEq/L (ref 3.5–5.1)
Sodium: 140 mEq/L (ref 135–145)

## 2017-09-03 NOTE — Addendum Note (Signed)
Addended by: Agnes Lawrence on: 09/03/2017 02:22 PM   Modules accepted: Orders

## 2017-09-03 NOTE — Patient Instructions (Addendum)
BEFORE YOU LEAVE: -Advanced directives folder or discussion with [place] visit paperwork -depression screening -Order bone density test at [place] [facility] -Flu shot -follow up: yearly for annual wellness visit with Margaret Mckenzie and physical with Dr. Maudie Mckenzie  Please call your cardiologist today and let them know your blood pressure remains elevated. See if it would be okay with them for you to take her diltiazem daily.  Please call your gastroenterologist today to schedule your colonoscopy.  We ordered her bone density test. Please let us know if you do not hear about this appointment in the next 1 week.  Please complete advanced directives.   [name] , Thank you for taking time to come for your Medicare Wellness Visit. I appreciate your ongoing commitment to your health goals. Please review the following plan we discussed and let me know if I can assist you in the future.   These are the goals we discussed: Goals    None      This is a list of the screening recommended for you and due dates:  Health Maintenance  Topic Date Due  . Colon Cancer Screening  Patient to call and schedule  . DEXA scan (bone density measurement)  Referral placed  . HIV Screening  declined  . Pneumonia vaccines (2 of 2 - PPSV23) 09/03/2018  . Mammogram  08/24/2019  . Pap Smear  09/03/2020  . Tetanus Vaccine  06/18/2023  . Flu Shot  Completed today  .  Hepatitis C: One time screening is recommended by Center for Disease Control  (CDC) for  adults born from [birth year] through 1964.   Completed today  *Topic was postponed. The date shown is not the original due date.    Health Maintenance for Postmenopausal Women Menopause is a normal process in which your reproductive ability comes to an end. This process happens gradually over a span of months to years, usually between the ages of 29 and 3. Menopause is complete when you have missed 12 consecutive menstrual periods. It is important to talk with  your health care provider about some of the most common conditions that affect postmenopausal women, such as heart disease, cancer, and bone loss (osteoporosis). Adopting a healthy lifestyle and getting preventive care can help to promote your health and wellness. Those actions can also lower your chances of developing some of these common conditions. What should I know about menopause? During menopause, you may experience a number of symptoms, such as:  Moderate-to-severe hot flashes.  Night sweats.  Decrease in sex drive.  Mood swings.  Headaches.  Tiredness.  Irritability.  Memory problems.  Insomnia.  Choosing to treat or not to treat menopausal changes is an individual decision that you make with your health care provider. What should I know about hormone replacement therapy and supplements? Hormone therapy products are effective for treating symptoms that are associated with menopause, such as hot flashes and night sweats. Hormone replacement carries certain risks, especially as you become older. If you are thinking about using estrogen or estrogen with progestin treatments, discuss the benefits and risks with your health care provider. What should I know about heart disease and stroke? Heart disease, heart attack, and stroke become more likely as you age. This may be due, in part, to the hormonal changes that your body experiences during menopause. These can affect how your body processes dietary fats, triglycerides, and cholesterol. Heart attack and stroke are both medical emergencies. There are many things that you can do to  help prevent heart disease and stroke:  Have your blood pressure checked at least every 1-2 years. High blood pressure causes heart disease and increases the risk of stroke.  If you are 38-54 years old, ask your health care provider if you should take aspirin to prevent a heart attack or a stroke.  Do not use any tobacco products, including cigarettes,  chewing tobacco, or electronic cigarettes. If you need help quitting, ask your health care provider.  It is important to eat a healthy diet and maintain a healthy weight. ? Be sure to include plenty of vegetables, fruits, low-fat dairy products, and lean protein. ? Avoid eating foods that are high in solid fats, added sugars, or salt (sodium).  Get regular exercise. This is one of the most important things that you can do for your health. ? Try to exercise for at least 150 minutes each week. The type of exercise that you do should increase your heart rate and make you sweat. This is known as moderate-intensity exercise. ? Try to do strengthening exercises at least twice each week. Do these in addition to the moderate-intensity exercise.  Know your numbers.Ask your health care provider to check your cholesterol and your blood glucose. Continue to have your blood tested as directed by your health care provider.  What should I know about cancer screening? There are several types of cancer. Take the following steps to reduce your risk and to catch any cancer development as early as possible. Breast Cancer  Practice breast self-awareness. ? This means understanding how your breasts normally appear and feel. ? It also means doing regular breast self-exams. Let your health care provider know about any changes, no matter how small.  If you are 82 or older, have a clinician do a breast exam (clinical breast exam or CBE) every year. Depending on your age, family history, and medical history, it may be recommended that you also have a yearly breast X-ray (mammogram).  If you have a family history of breast cancer, talk with your health care provider about genetic screening.  If you are at high risk for breast cancer, talk with your health care provider about having an MRI and a mammogram every year.  Breast cancer (BRCA) gene test is recommended for women who have family members with BRCA-related  cancers. Results of the assessment will determine the need for genetic counseling and BRCA1 and for BRCA2 testing. BRCA-related cancers include these types: ? Breast. This occurs in males or females. ? Ovarian. ? Tubal. This may also be called fallopian tube cancer. ? Cancer of the abdominal or pelvic lining (peritoneal cancer). ? Prostate. ? Pancreatic.  Cervical, Uterine, and Ovarian Cancer Your health care provider may recommend that you be screened regularly for cancer of the pelvic organs. These include your ovaries, uterus, and vagina. This screening involves a pelvic exam, which includes checking for microscopic changes to the surface of your cervix (Pap test).  For women ages 21-65, health care providers may recommend a pelvic exam and a Pap test every three years. For women ages 16-65, they may recommend the Pap test and pelvic exam, combined with testing for human papilloma virus (HPV), every five years. Some types of HPV increase your risk of cervical cancer. Testing for HPV may also be done on women of any age who have unclear Pap test results.  Other health care providers may not recommend any screening for nonpregnant women who are considered low risk for pelvic cancer and have no  symptoms. Ask your health care provider if a screening pelvic exam is right for you.  If you have had past treatment for cervical cancer or a condition that could lead to cancer, you need Pap tests and screening for cancer for at least 20 years after your treatment. If Pap tests have been discontinued for you, your risk factors (such as having a new sexual partner) need to be reassessed to determine if you should start having screenings again. Some women have medical problems that increase the chance of getting cervical cancer. In these cases, your health care provider may recommend that you have screening and Pap tests more often.  If you have a family history of uterine cancer or ovarian cancer, talk with  your health care provider about genetic screening.  If you have vaginal bleeding after reaching menopause, tell your health care provider.  There are currently no reliable tests available to screen for ovarian cancer.  Lung Cancer Lung cancer screening is recommended for adults 66-87 years old who are at high risk for lung cancer because of a history of smoking. A yearly low-dose CT scan of the lungs is recommended if you:  Currently smoke.  Have a history of at least 30 pack-years of smoking and you currently smoke or have quit within the past 15 years. A pack-year is smoking an average of one pack of cigarettes per day for one year.  Yearly screening should:  Continue until it has been 15 years since you quit.  Stop if you develop a health problem that would prevent you from having lung cancer treatment.  Colorectal Cancer  This type of cancer can be detected and can often be prevented.  Routine colorectal cancer screening usually begins at age 60 and continues through age 27.  If you have risk factors for colon cancer, your health care provider may recommend that you be screened at an earlier age.  If you have a family history of colorectal cancer, talk with your health care provider about genetic screening.  Your health care provider may also recommend using home test kits to check for hidden blood in your stool.  A small camera at the end of a tube can be used to examine your colon directly (sigmoidoscopy or colonoscopy). This is done to check for the earliest forms of colorectal cancer.  Direct examination of the colon should be repeated every 5-10 years until age 93. However, if early forms of precancerous polyps or small growths are found or if you have a family history or genetic risk for colorectal cancer, you may need to be screened more often.  Skin Cancer  Check your skin from head to toe regularly.  Monitor any moles. Be sure to tell your health care  provider: ? About any new moles or changes in moles, especially if there is a change in a mole's shape or color. ? If you have a mole that is larger than the size of a pencil eraser.  If any of your family members has a history of skin cancer, especially at a young age, talk with your health care provider about genetic screening.  Always use sunscreen. Apply sunscreen liberally and repeatedly throughout the day.  Whenever you are outside, protect yourself by wearing long sleeves, pants, a wide-brimmed hat, and sunglasses.  What should I know about osteoporosis? Osteoporosis is a condition in which bone destruction happens more quickly than new bone creation. After menopause, you may be at an increased risk for osteoporosis. To  help prevent osteoporosis or the bone fractures that can happen because of osteoporosis, the following is recommended:  If you are 46-54 years old, get at least 1,000 mg of calcium and at least 600 mg of vitamin D per day.  If you are older than age 53 but younger than age 15, get at least 1,200 mg of calcium and at least 600 mg of vitamin D per day.  If you are older than age 36, get at least 1,200 mg of calcium and at least 800 mg of vitamin D per day.  Smoking and excessive alcohol intake increase the risk of osteoporosis. Eat foods that are rich in calcium and vitamin D, and do weight-bearing exercises several times each week as directed by your health care provider. What should I know about how menopause affects my mental health? Depression may occur at any age, but it is more common as you become older. Common symptoms of depression include:  Low or sad mood.  Changes in sleep patterns.  Changes in appetite or eating patterns.  Feeling an overall lack of motivation or enjoyment of activities that you previously enjoyed.  Frequent crying spells.  Talk with your health care provider if you think that you are experiencing depression. What should I know  about immunizations? It is important that you get and maintain your immunizations. These include:  Tetanus, diphtheria, and pertussis (Tdap) booster vaccine.  Influenza every year before the flu season begins.  Pneumonia vaccine.  Shingles vaccine.  Your health care provider may also recommend other immunizations. This information is not intended to replace advice given to you by your health care provider. Make sure you discuss any questions you have with your health care provider. Document Released: 01/05/2006 Document Revised: 06/02/2016 Document Reviewed: 08/17/2015 Elsevier Interactive Patient Education  2018 [employer].

## 2017-09-04 LAB — HEPATITIS C ANTIBODY
HEP C AB: NONREACTIVE
SIGNAL TO CUT-OFF: 0.01 (ref <1.00)

## 2017-09-06 ENCOUNTER — Telehealth: Payer: Self-pay | Admitting: Cardiology

## 2017-09-06 DIAGNOSIS — Z79899 Other long term (current) drug therapy: Secondary | ICD-10-CM

## 2017-09-06 NOTE — Telephone Encounter (Signed)
Returned the call to the patient. She stated that for the last few months her blood pressure has been elevated. She had been advised at her last office visit to get a blood pressure cuff and monitor her blood pressure at home.  Today 140/82 HR 73 Yesterday 154/96 HR 67 Tuesday 146/91 HR 82  Per the patient, her PCP wanted her to contact her cardiologist to see if the patient needed to be on the Cardizem daily instead of as needed (notes in epic) She was concerned that her heart rate may be to low. Her blood pressure and heart rate at the PCP appointment on 10/8 was 160/78 HR 60. The patient is asymptomatic. Will route to the provider for his recommendation.

## 2017-09-06 NOTE — Telephone Encounter (Signed)
New message     Pt c/o BP issue: STAT if pt c/o blurred vision, one-sided weakness or slurred speech  1. What are your last 5 BP readings?  140/82 today 154/96 yesterday 144/96 tuesday  2. Are you having any other symptoms (ex. Dizziness, headache, blurred vision, passed out)? no  3. What is your BP issue?  PCP wants to know if you want to start her on something for her bp

## 2017-09-07 LAB — CYTOLOGY - PAP
Diagnosis: NEGATIVE
HPV (WINDOPATH): NOT DETECTED

## 2017-09-08 NOTE — Telephone Encounter (Signed)
I would suggest lisinopri/HCT 10/12.5 once daily po.  Disp number 30 with 11 refills.  Check BMET in two weeks after starting.

## 2017-09-11 MED ORDER — LISINOPRIL-HYDROCHLOROTHIAZIDE 10-12.5 MG PO TABS: 1.0000 | ORAL_TABLET | Freq: Every day | ORAL | 3 refills | Status: DC

## 2017-09-11 NOTE — Telephone Encounter (Signed)
Spoke with pt letting her know Dr Percival Spanish will like for her to start Lisinopril/HCtz 10/12.5 mg daily, which was send into her [place] and to get blood work in 2 weeks and monitor blood pressure, pt voice understanding Rx has been sent to the pharmacy electronically. Lab slip mail to pt

## 2017-09-12 ENCOUNTER — Encounter: Payer: Self-pay | Admitting: Family Medicine

## 2017-09-13 ENCOUNTER — Other Ambulatory Visit: Payer: Self-pay | Admitting: *Deleted

## 2017-09-13 DIAGNOSIS — E2839 Other primary ovarian failure: Secondary | ICD-10-CM

## 2017-09-17 ENCOUNTER — Ambulatory Visit (INDEPENDENT_AMBULATORY_CARE_PROVIDER_SITE_OTHER)
Admission: RE | Admit: 2017-09-17 | Discharge: 2017-09-17 | Disposition: A | Discharge status: Home / Self Care | Payer: PPO | Source: Ambulatory Visit | Attending: Family Medicine | Admitting: Family Medicine

## 2017-09-17 DIAGNOSIS — E2839 Other primary ovarian failure: Secondary | ICD-10-CM | POA: Diagnosis not present

## 2017-09-21 NOTE — Telephone Encounter (Unsigned)
Copied from [place] [id number]. Topic: Quick Communication - See Telephone Encounter >> Sep 21, 2017  1:33 PM Lennox Solders wrote: CRM for notification. See Telephone encounter for:  09/21/17. >> Sep 21, 2017  1:34 PM Lennox Solders wrote: Pt is returning joann call concerning bone density results

## 2017-09-25 DIAGNOSIS — Z79899 Other long term (current) drug therapy: Secondary | ICD-10-CM | POA: Diagnosis not present

## 2017-10-23 DIAGNOSIS — M2241 Chondromalacia patellae, right knee: Secondary | ICD-10-CM | POA: Diagnosis not present

## 2017-12-06 ENCOUNTER — Encounter: Payer: Self-pay | Admitting: Family Medicine

## 2017-12-17 ENCOUNTER — Encounter: Payer: Self-pay | Admitting: Nurse Practitioner

## 2017-12-31 ENCOUNTER — Telehealth: Payer: Self-pay

## 2017-12-31 ENCOUNTER — Encounter: Payer: Self-pay | Admitting: Nurse Practitioner

## 2017-12-31 ENCOUNTER — Ambulatory Visit (INDEPENDENT_AMBULATORY_CARE_PROVIDER_SITE_OTHER): Payer: PPO | Admitting: Nurse Practitioner

## 2017-12-31 VITALS — BP 114/72 | HR 70 | Ht 64.0 in | Wt 139.4 lb

## 2017-12-31 DIAGNOSIS — Z7901 Long term (current) use of anticoagulants: Secondary | ICD-10-CM | POA: Diagnosis not present

## 2017-12-31 DIAGNOSIS — Z1211 Encounter for screening for malignant neoplasm of colon: Secondary | ICD-10-CM | POA: Diagnosis not present

## 2017-12-31 NOTE — Patient Instructions (Addendum)
If you are age 66 or older, your body mass index should be between 23-30. Your Body mass index is 23.92 kg/m. If this is out of the aforementioned range listed, please consider follow up with your Primary Care Provider.  If you are age 63 or younger, your body mass index should be between 19-25. Your Body mass index is 23.92 kg/m. If this is out of the aformentioned range listed, please consider follow up with your Primary Care Provider.   You have been scheduled for a colonoscopy. Please follow written instructions given to you at your visit today.  Please pick up your prep supplies at the pharmacy within the next 1-3 days. If you use inhalers (even only as needed), please bring them with you on the day of your procedure. Your physician has requested that you go to www.startemmi.com and enter the access code given to you at your visit today. This web site gives a general overview about your procedure. However, you should still follow specific instructions given to you by our office regarding your preparation for the procedure.  You will be contacted by our office prior to your procedure for directions on holding your Eliquis.  If you do not hear from our office 1 week prior to your scheduled procedure, please call [phone] to discuss.   Thank you for choosing me and [facility].   Tye Savoy, NP

## 2017-12-31 NOTE — Telephone Encounter (Signed)
Patient with diagnosis of Afib on Eliquis for anticoagulation.    Procedure: Endoscopy Date of procedure: 02/21/18  CHADS2-VASc score of  3 (CHF, HTN, AGE, DM2, stroke/tia x 2, CAD, AGE, female)  CrCl 44ml/min  Per office protocol, patient can hold Eliquis for 24 hours prior to procedure.

## 2017-12-31 NOTE — Telephone Encounter (Signed)
[facility] [address] [place], [place]  [address] Phone:  [phone]   Fax:  [phone]  12/31/2017   RE:      Margaret Mckenzie DOB:   May 04, 1951 MRN:   [id number]   Dear Ms Oren Section, NP,    We have scheduled the above patient for an endoscopic procedure. Our records show that she is on anticoagulation therapy.   Please advise as to how long the patient may come off her therapy of Eliquis prior to the colonoscopy procedure, which is scheduled for 02/21/18.  Please fax back/ or route the completed form to [facility] at [phone].   Sincerely,    Thurmon Fair, RMA

## 2017-12-31 NOTE — Progress Notes (Addendum)
Chief Complaint:  Colon cancer screening  Referring Provider:      Colin Benton, DO   ASSESSMENT AND PLAN;   34. 67 yo female for colon cancer screening. Last colonoscopy Carlean Purl) was 2007. No GI complaints.  -Patient will be scheduled for a screening colonoscopy with possible polypectomy.  The risks and benefits of the procedure were discussed and the patient agrees to proceed.   2. AFIB, on Eliquis since July.  -Hold Eliquis for 2 days before procedure - will instruct when and how to resume after procedure. Patient understands that there is a low but real risk of cardiovascular event such as heart attack, stroke, embolism, thrombosis or ischemia/infarct while off Eliquis. The patient consents to proceed. Will communicate by phone or EMR with patient's prescribing provider to confirm that holding Eliquis is reasonable in this case.   Agree with Ms. Guenther's assessment and plan. Gatha Mayer, MD, [facility]    HPI:     Patient is 67 year old female known remotely to Dr. Carlean Purl from a colonoscopy in 2007. Patient is here to discuss colon cancer screening. She has no GI complaints. Specifically, no bowel changes, blood in stool, abdominal pain or weight loss. No family history colon cancer  Patient was started on Eliquis in July for AFib. Her AFib is intermittent, usually gives her palpitations. She has not had any recent shortness of breath, chest pains or palpitations. Takes Cardizem as needed.    Past Medical History:  Diagnosis Date  . Bipolar 1 disorder ([place])   . Chicken pox   . Hypertension    high blood pressure readings   . Kidney disease    kidney stones and saw urologist, Dr. Edward Qualia for lithotripsy remotely   . PAF (paroxysmal atrial fibrillation) ([place])      Past Surgical History:  Procedure Laterality Date  . BUNIONECTOMY  2007  . TONSILLECTOMY AND ADENOIDECTOMY  1975   Family History  Problem Relation Age of Onset  . Osteoporosis Mother   . Stroke  Mother        TIA, [place]  . Mental retardation Mother   . Hypertension Father   . Heart disease Father 33  . Stroke Father 24  . Mental illness Sister    Social History   Tobacco Use  . Smoking status: Former Smoker    Packs/day: 0.50    Types: Cigarettes    Last attempt to quit: 04/09/2012    Years since quitting: 5.7  . Smokeless tobacco: Never Used  . Tobacco comment: pt has quit smoking  Substance Use Topics  . Alcohol use: Yes    Comment: 1 glass of wine a night   . Drug use: No   Current Outpatient Medications  Medication Sig Dispense Refill  . ALPRAZolam (XANAX) 0.25 MG tablet Take 0.25 mg by mouth daily as needed for anxiety or sleep.     Marland Kitchen apixaban (ELIQUIS) 5 MG TABS tablet Take 1 tablet (5 mg total) by mouth 2 (two) times daily. 60 tablet 6  . cholecalciferol (VITAMIN D) 1000 units tablet Take 1,000 Units by mouth daily.    Marland Kitchen diltiazem (CARDIZEM) 30 MG tablet Take 1 tablet every 4 hours AS NEEDED for AFIB heart rate >100 45 tablet 1  . lamoTRIgine (LAMICTAL) 150 MG tablet Take 300 mg by mouth at bedtime.     Marland Kitchen lisinopril-hydrochlorothiazide (ZESTORETIC) 10-12.5 MG tablet Take 1 tablet by mouth daily. 90 tablet 3  . Magnesium 500 MG TABS Take 1 tablet  by mouth at bedtime.    . risperiDONE (RISPERDAL) 3 MG tablet Take 3 mg by mouth at bedtime.    . zaleplon (SONATA) 10 MG capsule Take 10 mg by mouth at bedtime as needed for sleep.      No current facility-administered medications for this visit.    No Known Allergies   Review of Systems: All systems reviewed and negative except where noted in HPI.    Physical Exam:    BP 114/72   Pulse 70   Ht 5\' 4"  (1.626 m)   Wt 139 lb 6 oz (63.2 kg)   BMI 23.92 kg/m  Constitutional:  Well-developed, white female in no acute distress. Psychiatric: Normal mood and affect. Behavior is normal. EENT: Pupils normal.  Conjunctivae are normal. No scleral icterus. Neck supple.  Cardiovascular: Normal rate, regular rhythm. No  edema Pulmonary/chest: Effort normal and breath sounds normal. No wheezing, rales or rhonchi. Abdominal: Soft, nondistended. Nontender. Bowel sounds active throughout. There are no masses palpable. No hepatomegaly. Neurological: Alert and oriented to person place and time. Skin: Skin is warm and dry. No rashes noted.  Tye Savoy, NP  12/31/2017, 11:15 AM  Cc: [name], DO

## 2018-01-08 NOTE — Telephone Encounter (Signed)
Spoke with patient his morning regarding holding Eliquis 24 hours prior to procedure Per Eino Farber instructions.  Patient verbalized understanding.

## 2018-01-28 ENCOUNTER — Other Ambulatory Visit (HOSPITAL_COMMUNITY): Payer: Self-pay | Admitting: Nurse Practitioner

## 2018-02-07 ENCOUNTER — Encounter: Payer: Self-pay | Admitting: Internal Medicine

## 2018-02-21 ENCOUNTER — Encounter: Payer: PPO | Admitting: Internal Medicine

## 2018-02-21 ENCOUNTER — Ambulatory Visit (AMBULATORY_SURGERY_CENTER): Payer: PPO | Admitting: Internal Medicine

## 2018-02-21 ENCOUNTER — Other Ambulatory Visit: Payer: Self-pay

## 2018-02-21 ENCOUNTER — Encounter: Payer: Self-pay | Admitting: Internal Medicine

## 2018-02-21 VITALS — BP 141/85 | HR 67 | Temp 98.4°F | Resp 18 | Ht 64.0 in | Wt 139.0 lb

## 2018-02-21 DIAGNOSIS — I10 Essential (primary) hypertension: Secondary | ICD-10-CM | POA: Diagnosis not present

## 2018-02-21 DIAGNOSIS — K621 Rectal polyp: Secondary | ICD-10-CM | POA: Diagnosis not present

## 2018-02-21 DIAGNOSIS — D128 Benign neoplasm of rectum: Secondary | ICD-10-CM

## 2018-02-21 DIAGNOSIS — Z1211 Encounter for screening for malignant neoplasm of colon: Secondary | ICD-10-CM

## 2018-02-21 DIAGNOSIS — F319 Bipolar disorder, unspecified: Secondary | ICD-10-CM | POA: Diagnosis not present

## 2018-02-21 DIAGNOSIS — I4891 Unspecified atrial fibrillation: Secondary | ICD-10-CM | POA: Diagnosis not present

## 2018-02-21 MED ORDER — SODIUM CHLORIDE 0.9 % IV SOLN: 500.0000 mL | Freq: Once | INTRAVENOUS | Status: DC

## 2018-02-21 NOTE — Progress Notes (Signed)
Pt. Reports no change her medical or surgical history since her pre-visit 12/31/2017.

## 2018-02-21 NOTE — Progress Notes (Signed)
Report to PACU, RN, vss, BBS= Clear.  

## 2018-02-21 NOTE — Op Note (Signed)
[place] Patient Name: Margaret Mckenzie Procedure Date: 02/21/2018 1:19 PM MRN: [id number] Endoscopist: Gatha Mayer , MD Age: 67 Referring MD:  Date of Birth: 1951/08/18 Gender: Female Account #: [id number] Procedure:                Colonoscopy Indications:              Screening for colorectal malignant neoplasm, Last                            colonoscopy: 2009 Medicines:                Propofol per Anesthesia, Monitored Anesthesia Care Procedure:                Pre-Anesthesia Assessment:                           - Prior to the procedure, a History and Physical                            was performed, and patient medications and                            allergies were reviewed. The patient's tolerance of                            previous anesthesia was also reviewed. The risks                            and benefits of the procedure and the sedation                            options and risks were discussed with the patient.                            All questions were answered, and informed consent                            was obtained. Prior Anticoagulants: The patient has                            taken no previous anticoagulant or antiplatelet                            agents. ASA Grade Assessment: II - A patient with                            mild systemic disease. After reviewing the risks                            and benefits, the patient was deemed in                            satisfactory condition to undergo the procedure.  After obtaining informed consent, the colonoscope                            was passed under direct vision. Throughout the                            procedure, the patient's blood pressure, pulse, and                            oxygen saturations were monitored continuously. The                            Colonoscope was introduced through the anus and                            advanced to the the  cecum, identified by                            appendiceal orifice and ileocecal valve. The                            colonoscopy was performed without difficulty. The                            patient tolerated the procedure well. The quality                            of the bowel preparation was excellent. The                            ileocecal valve, appendiceal orifice, and rectum                            were photographed. The bowel preparation used was                            Miralax. Scope In: 1:28:08 PM Scope Out: 1:42:54 PM Scope Withdrawal Time: 0 hours 11 minutes 8 seconds  Total Procedure Duration: 0 hours 14 minutes 46 seconds  Findings:                 The perianal and digital rectal examinations were                            normal.                           A 2 mm polyp was found in the rectum. The polyp was                            sessile. The polyp was removed with a cold biopsy                            forceps. Resection and retrieval were complete.  Verification of patient identification for the                            specimen was done. Estimated blood loss was minimal.                           Diverticula were found in the sigmoid colon.                           The exam was otherwise without abnormality on                            direct and retroflexion views. Complications:            No immediate complications. Estimated Blood Loss:     Estimated blood loss was minimal. Impression:               - One 2 mm polyp in the rectum, removed with a cold                            biopsy forceps. Resected and retrieved.                           - Diverticulosis in the sigmoid colon.                           - The examination was otherwise normal on direct                            and retroflexion views. Recommendation:           - Patient has a contact number available for                            emergencies.  The signs and symptoms of potential                            delayed complications were discussed with the                            patient. Return to normal activities tomorrow.                            Written discharge instructions were provided to the                            patient.                           - Resume previous diet.                           - Continue present medications.                           - Repeat colonoscopy is recommended. The  colonoscopy date will be determined after pathology                            results from today's exam become available for                            review. Gatha Mayer, MD 02/21/2018 1:51:44 PM This report has been signed electronically.

## 2018-02-21 NOTE — Progress Notes (Signed)
Called to room to assist during endoscopic procedure.  Patient ID and intended procedure confirmed with present staff. Received instructions for my participation in the procedure from the performing physician.  

## 2018-02-21 NOTE — Patient Instructions (Addendum)
   I found and removed one tiny polyp from the rectum. It looks benign.  You also have a condition called diverticulosis - common and not usually a problem. Please read the handout provided.  I appreciate the opportunity to care for you. Gatha Mayer, MD, [facility]   **Handouts given on polyps and diverticulosis**  *Start Eliquis tonight*  YOU HAD AN ENDOSCOPIC PROCEDURE TODAY: Refer to the procedure report and other information in the discharge instructions given to you for any specific questions about what was found during the examination. If this information does not answer your questions, please call [place] office at [phone] to clarify.   YOU SHOULD EXPECT: Some feelings of bloating in the abdomen. Passage of more gas than usual. Walking can help get rid of the air that was put into your GI tract during the procedure and reduce the bloating. If you had a lower endoscopy (such as a colonoscopy or flexible sigmoidoscopy) you may notice spotting of blood in your stool or on the toilet paper. Some abdominal soreness may be present for a day or two, also.  DIET: Your first meal following the procedure should be a light meal and then it is ok to progress to your normal diet. A half-sandwich or bowl of soup is an example of a good first meal. Heavy or fried foods are harder to digest and may make you feel nauseous or bloated. Drink plenty of fluids but you should avoid alcoholic beverages for 24 hours. If you had a esophageal dilation, please see attached instructions for diet.    ACTIVITY: Your care partner should take you home directly after the procedure. You should plan to take it easy, moving slowly for the rest of the day. You can resume normal activity the day after the procedure however YOU SHOULD NOT DRIVE, use power tools, machinery or perform tasks that involve climbing or major physical exertion for 24 hours (because of the sedation medicines used during the test).   SYMPTOMS  TO REPORT IMMEDIATELY: A gastroenterologist can be reached at any hour. Please call [phone]  for any of the following symptoms:  Following lower endoscopy (colonoscopy, flexible sigmoidoscopy) Excessive amounts of blood in the stool  Significant tenderness, worsening of abdominal pains  Swelling of the abdomen that is new, acute  Fever of 100 or higher    FOLLOW UP:  If any biopsies were taken you will be contacted by phone or by letter within the next 1-3 weeks. Call [phone]  if you have not heard about the biopsies in 3 weeks.  Please also call with any specific questions about appointments or follow up tests.

## 2018-02-22 ENCOUNTER — Telehealth: Payer: Self-pay

## 2018-02-22 NOTE — Telephone Encounter (Signed)
  Follow up Call-  Call back number 02/21/2018  Post procedure Call Back phone  # [phone]  Permission to leave phone message Yes  Some recent data might be hidden     Patient questions:  Do you have a fever, pain , or abdominal swelling? No. Pain Score  0 *  Have you tolerated food without any problems? Yes.    Have you been able to return to your normal activities? Yes.    Do you have any questions about your discharge instructions: Diet   No. Medications  No. Follow up visit  No.  Do you have questions or concerns about your Care? No.  Actions: * If pain score is 4 or above: No action needed, pain <4.

## 2018-03-01 ENCOUNTER — Encounter: Payer: Self-pay | Admitting: Internal Medicine

## 2018-03-01 NOTE — Progress Notes (Signed)
Not precancerous polyp Recall 2029 My Chart

## 2018-04-26 DIAGNOSIS — F3181 Bipolar II disorder: Secondary | ICD-10-CM | POA: Diagnosis not present

## 2018-04-29 ENCOUNTER — Other Ambulatory Visit (HOSPITAL_COMMUNITY): Payer: Self-pay | Admitting: *Deleted

## 2018-04-29 MED ORDER — APIXABAN 5 MG PO TABS: 5.0000 mg | ORAL_TABLET | Freq: Two times a day (BID) | ORAL | 0 refills | Status: DC

## 2018-05-27 ENCOUNTER — Other Ambulatory Visit (HOSPITAL_COMMUNITY): Payer: Self-pay | Admitting: *Deleted

## 2018-05-27 MED ORDER — APIXABAN 5 MG PO TABS: 5.0000 mg | ORAL_TABLET | Freq: Two times a day (BID) | ORAL | 6 refills | Status: DC

## 2018-06-11 ENCOUNTER — Ambulatory Visit (HOSPITAL_COMMUNITY)
Admission: RE | Admit: 2018-06-11 | Discharge: 2018-06-11 | Disposition: A | Discharge status: Home / Self Care | Payer: PPO | Source: Ambulatory Visit | Attending: Nurse Practitioner | Admitting: Nurse Practitioner

## 2018-06-11 ENCOUNTER — Encounter (HOSPITAL_COMMUNITY): Payer: Self-pay | Admitting: Nurse Practitioner

## 2018-06-11 VITALS — BP 132/70 | HR 86 | Ht 64.0 in | Wt 141.0 lb

## 2018-06-11 DIAGNOSIS — F319 Bipolar disorder, unspecified: Secondary | ICD-10-CM | POA: Insufficient documentation

## 2018-06-11 DIAGNOSIS — I10 Essential (primary) hypertension: Secondary | ICD-10-CM

## 2018-06-11 DIAGNOSIS — Z87891 Personal history of nicotine dependence: Secondary | ICD-10-CM | POA: Insufficient documentation

## 2018-06-11 DIAGNOSIS — Z79899 Other long term (current) drug therapy: Secondary | ICD-10-CM | POA: Diagnosis not present

## 2018-06-11 DIAGNOSIS — I480 Paroxysmal atrial fibrillation: Secondary | ICD-10-CM | POA: Diagnosis not present

## 2018-06-11 NOTE — Progress Notes (Signed)
Primary Care Physician: [name], DO Primary Cardiologist: Emilie Rutter Colton Engdahl is a 67 y.o. female with a history of paroxysmal atrial fibrillation who presents for follow up in the [facility].  Since last being seen in clinic, the patient reports doing very well.  She has had 3 episodes of AF over the last year.  Her episodes last less than 30 minute and resolve after taking prn Diltiazem. Today, she denies symptoms of palpitations, chest pain, shortness of breath, orthopnea, PND, lower extremity edema, dizziness, presyncope, syncope, snoring, daytime somnolence, bleeding, or neurologic sequela. The patient is tolerating medications without difficulties and is otherwise without complaint today.    Atrial Fibrillation Risk Factors:  she does not have symptoms or diagnosis of sleep apnea.  she does not have a history of rheumatic fever.  she does have a prior history of alcohol use.    she has a BMI of Body mass index is 24.2 kg/m.Marland Kitchen Filed Weights   06/11/18 1428  Weight: 141 lb (64 kg)    LA size: 22   Atrial Fibrillation Management history:  Previous antiarrhythmic drugs: none  Previous cardioversions: none  Previous ablations: none  CHADS2VASC score: 3  Anticoagulation history: Eliquis   Past Medical History:  Diagnosis Date  . Bipolar 1 disorder ([place])   . Chicken pox   . Hypertension    high blood pressure readings   . Kidney disease    kidney stones and saw urologist, Dr. Edward Qualia for lithotripsy remotely   . PAF (paroxysmal atrial fibrillation) ([place])    Past Surgical History:  Procedure Laterality Date  . BUNIONECTOMY  2007  . TONSILLECTOMY AND ADENOIDECTOMY  1975    Current Outpatient Medications  Medication Sig Dispense Refill  . ALPRAZolam (XANAX) 0.25 MG tablet Take 0.25 mg by mouth daily as needed for anxiety or sleep.     Marland Kitchen apixaban (ELIQUIS) 5 MG TABS tablet Take 1 tablet (5 mg total) by mouth 2  (two) times daily. 60 tablet 6  . cholecalciferol (VITAMIN D) 1000 units tablet Take 1,000 Units by mouth daily.    Marland Kitchen diltiazem (CARDIZEM) 30 MG tablet Take 1 tablet every 4 hours AS NEEDED for AFIB heart rate >100 45 tablet 1  . lamoTRIgine (LAMICTAL) 150 MG tablet Take 300 mg by mouth at bedtime.     Marland Kitchen lisinopril-hydrochlorothiazide (ZESTORETIC) 10-12.5 MG tablet Take 1 tablet by mouth daily. 90 tablet 3  . Magnesium 500 MG TABS Take 1 tablet by mouth at bedtime.    . risperiDONE (RISPERDAL) 3 MG tablet Take 3 mg by mouth at bedtime.    . zaleplon (SONATA) 10 MG capsule Take 10 mg by mouth at bedtime as needed for sleep.      Current Facility-Administered Medications  Medication Dose Route Frequency Provider Last Rate Last Dose  . 0.9 %  sodium chloride infusion  500 mL Intravenous Once Gatha Mayer, MD        No Known Allergies  Social History   Socioeconomic History  . Marital status: Divorced    Spouse name: Not on file  . Number of children: Not on file  . Years of education: Not on file  . Highest education level: Not on file  Occupational History  . Not on file  Social Needs  . Financial resource strain: Not on file  . Food insecurity:    Worry: Not on file    Inability: Not on file  . Transportation  needs:    Medical: Not on file    Non-medical: Not on file  Tobacco Use  . Smoking status: Former Smoker    Packs/day: 0.50    Types: Cigarettes    Last attempt to quit: 07/11/2017    Years since quitting: 0.9  . Smokeless tobacco: Never Used  . Tobacco comment: pt has quit smoking  Substance and Sexual Activity  . Alcohol use: Yes    Comment: 1 glass of wine a night   . Drug use: No  . Sexual activity: Not on file  Lifestyle  . Physical activity:    Days per week: Not on file    Minutes per session: Not on file  . Stress: Not on file  Relationships  . Social connections:    Talks on phone: Not on file    Gets together: Not on file    Attends religious  service: Not on file    Active member of club or organization: Not on file    Attends meetings of clubs or organizations: Not on file    Relationship status: Not on file  . Intimate partner violence:    Fear of current or ex partner: Not on file    Emotionally abused: Not on file    Physically abused: Not on file    Forced sexual activity: Not on file  Other Topics Concern  . 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 Osteoporosis Mother   . Stroke Mother        TIA, [place]  . Mental retardation Mother   . Hypertension Father   . Heart disease Father 67  . Stroke Father 53  . Mental illness Sister     ROS- All systems are reviewed and negative except as per the HPI above.  Physical Exam: Vitals:   06/11/18 1428  BP: 132/70  Pulse: 86  SpO2: 98%  Weight: 141 lb (64 kg)  Height: 5\' 4"  (1.626 m)    GEN- The patient is well appearing, alert and oriented x 3 today.   Head- normocephalic, atraumatic Eyes-  Sclera clear, conjunctiva pink Ears- hearing intact Oropharynx- clear Neck- supple  Lungs- Clear to ausculation bilaterally, normal work of breathing Heart- Regular rate and rhythm  GI- soft, NT, ND, + BS Extremities- no clubbing, cyanosis, or edema MS- no significant deformity or atrophy Skin- no rash or lesion Psych- euthymic mood, full affect Neuro- strength and sensation are intact  Wt Readings from Last 3 Encounters:  06/11/18 141 lb (64 kg)  02/21/18 139 lb (63 kg)  12/31/17 139 lb 6 oz (63.2 kg)    EKG today demonstrates sinus rhythm, rate 86, iRBB Echo 05/2017 demonstrated EF 55-60%, no RWMA, grade 1 diastolic dysfunction  Epic records are reviewed at length  today  Assessment and Plan:  1. Paroxysmal atrial fibrillation Doing well with only rare episodes that last less than 30 minutes. Continue prn Diltiazem Continue Eliquis for CHADS2VASC of 3 PCP following labs  2. HTN Stable No change required today   Follow up with Dr Percival Spanish as scheduled, AF clinic as needed  Chanetta Marshall, NP 06/11/2018 2:36 PM

## 2018-06-11 NOTE — Addendum Note (Signed)
Encounter addended by: Patsey Berthold, NP on: 06/11/2018 2:50 PM  Actions taken: Charge Capture section accepted

## 2018-08-20 ENCOUNTER — Other Ambulatory Visit: Payer: Self-pay | Admitting: Family Medicine

## 2018-08-20 ENCOUNTER — Telehealth: Payer: Self-pay | Admitting: Cardiology

## 2018-08-20 DIAGNOSIS — Z1231 Encounter for screening mammogram for malignant neoplasm of breast: Secondary | ICD-10-CM

## 2018-08-20 MED ORDER — LISINOPRIL-HYDROCHLOROTHIAZIDE 10-12.5 MG PO TABS: 1.0000 | ORAL_TABLET | Freq: Every day | ORAL | 1 refills | Status: DC

## 2018-08-20 NOTE — Telephone Encounter (Signed)
New Message    *STAT* If patient is at the pharmacy, call can be transferred to refill team.   1. Which medications need to be refilled? (please list name of each medication and dose if known) lisinopril-hydrochlorothiazide (ZESTORETIC) 10-12.5 MG tablet  2. Which pharmacy/location (including street and city if local pharmacy) is medication to be sent to? [facility] # [place], [place]  3. Do they need a 30 day or 90 day supply? [place]

## 2018-08-26 ENCOUNTER — Ambulatory Visit
Admission: RE | Admit: 2018-08-26 | Discharge: 2018-08-26 | Disposition: A | Discharge status: Home / Self Care | Payer: PPO | Source: Ambulatory Visit | Attending: Family Medicine | Admitting: Family Medicine

## 2018-08-26 DIAGNOSIS — Z1231 Encounter for screening mammogram for malignant neoplasm of breast: Secondary | ICD-10-CM

## 2018-08-26 DIAGNOSIS — Z79899 Other long term (current) drug therapy: Secondary | ICD-10-CM | POA: Diagnosis not present

## 2018-09-05 NOTE — Progress Notes (Signed)
Cardiology Office Note   Date:  09/06/2018   ID:  Margaret, Mckenzie 1951/10/17, MRN [id number]  PCP:  [name], DO  Cardiologist:   Minus Breeding, MD    Chief Complaint  Patient presents with  . Atrial Fibrillation      History of Present Illness: Margaret Mckenzie is a 67 y.o. female who presents for evaluation of atrial fib.  She was noted to be in this in her PCP office in July of last year.  She as been seen in the Afib Clinic.  She did have an echocardiogram and this demonstrated some LVH and was otherwise unremarkable.    She says she rarely feels the atrial fibrillation may be for 10 minutes at a time.  She rarely needs to Cardizem.  She tolerates the anticoagulation.  She walks routinely and does some other exercises.  She still works.  She is actually been getting some pain in her left foot and gets visibly swollen.  She has an appointment to get this looked at this afternoon.  Past Medical History:  Diagnosis Date  . Bipolar 1 disorder ([place])   . Chicken pox   . Hypertension    high blood pressure readings   . Kidney disease    kidney stones and saw urologist, Dr. Edward Qualia for lithotripsy remotely   . PAF (paroxysmal atrial fibrillation) ([place])     Past Surgical History:  Procedure Laterality Date  . BUNIONECTOMY  2007  . TONSILLECTOMY AND ADENOIDECTOMY  1975     Current Outpatient Medications  Medication Sig Dispense Refill  . ALPRAZolam (XANAX) 0.25 MG tablet Take 0.25 mg by mouth daily as needed for anxiety or sleep.     Marland Kitchen apixaban (ELIQUIS) 5 MG TABS tablet Take 1 tablet (5 mg total) by mouth 2 (two) times daily. 180 tablet 3  . cholecalciferol (VITAMIN D) 1000 units tablet Take 1,000 Units by mouth daily.    Marland Kitchen diltiazem (CARDIZEM) 30 MG tablet Take 1 tablet every 4 hours AS NEEDED for AFIB heart rate >100 45 tablet 1  . lamoTRIgine (LAMICTAL) 150 MG tablet Take 300 mg by mouth at bedtime.     Marland Kitchen lisinopril-hydrochlorothiazide  (ZESTORETIC) 10-12.5 MG tablet Take 1 tablet by mouth daily. 30 tablet 1  . Magnesium 500 MG TABS Take 1 tablet by mouth at bedtime.    . risperiDONE (RISPERDAL) 3 MG tablet Take 3 mg by mouth at bedtime.    . traZODone (DESYREL) 50 MG tablet Take 50 mg by mouth at bedtime.     Current Facility-Administered Medications  Medication Dose Route Frequency Provider Last Rate Last Dose  . 0.9 %  sodium chloride infusion  500 mL Intravenous Once Gatha Mayer, MD        Allergies:   Patient has no known allergies.    ROS:  Please see the history of present illness.   Otherwise, review of systems are positive for none.   All other systems are reviewed and negative.    PHYSICAL EXAM: VS:  BP 132/84   Pulse 84   Ht 5\' 4"  (1.626 m)   Wt 138 lb 12.8 oz (63 kg)   SpO2 99%   BMI 23.82 kg/m  , BMI Body mass index is 23.82 kg/m. GENERAL:  Well appearing NECK:  No jugular venous distention, waveform within normal limits, carotid upstroke brisk and symmetric, no bruits, no thyromegaly LUNGS:  Clear to auscultation bilaterally CHEST:  Unremarkable HEART:  PMI not  displaced or sustained,S1 and S2 within normal limits, no S3, no S4, no clicks, no rubs, no murmurs ABD:  Flat, positive bowel sounds normal in frequency in pitch, no bruits, no rebound, no guarding, no midline pulsatile mass, no hepatomegaly, no splenomegaly EXT:  2 plus pulses throughout, no edema, no cyanosis no clubbing, swelling of the dorsum of her left foot   EKG:  EKG is not ordered today.   Recent Labs: No results found for requested labs within last 8760 hours.    Lipid Panel    Component Value Date/Time   CHOL 220 (A) 06/04/2017   TRIG 82 06/04/2017   HDL 112 (A) 06/04/2017   CHOLHDL 2 08/04/2013 1102   VLDL 68.6 (H) 08/04/2013 1102   LDLCALC 92 06/04/2017   LDLDIRECT 58.8 08/04/2013 1102      Wt Readings from Last 3 Encounters:  09/06/18 138 lb 12.8 oz (63 kg)  06/11/18 141 lb (64 kg)  02/21/18 139 lb (63  kg)      Other studies Reviewed: Additional studies/ records that were reviewed today include:  None Review of the above records demonstrates:  None   ASSESSMENT AND PLAN:   ATRIAL FIB.  Ms. [name] has a CHA2DS2 - VASc score of 3.  No change in therapy.  She is going to have bloodwork to include a CBC next week with [name], DO.    HTN:  The blood pressure is at target. No change in medications is indicated. We will continue with therapeutic lifestyle changes (TLC). .   Current medicines are reviewed at length with the patient today.  The patient does not have concerns regarding medicines.  The following changes have been made:  None  Labs/ tests ordered today include: None  No orders of the defined types were placed in this encounter.    Disposition:   FU with 12 months.    Signed, Minus Breeding, MD  09/06/2018 9:30 AM    [place]

## 2018-09-06 ENCOUNTER — Encounter: Payer: Self-pay | Admitting: Adult Health

## 2018-09-06 ENCOUNTER — Ambulatory Visit (INDEPENDENT_AMBULATORY_CARE_PROVIDER_SITE_OTHER): Payer: PPO | Admitting: Adult Health

## 2018-09-06 ENCOUNTER — Ambulatory Visit (INDEPENDENT_AMBULATORY_CARE_PROVIDER_SITE_OTHER): Payer: PPO | Admitting: Cardiology

## 2018-09-06 ENCOUNTER — Encounter: Payer: Self-pay | Admitting: Cardiology

## 2018-09-06 ENCOUNTER — Ambulatory Visit (INDEPENDENT_AMBULATORY_CARE_PROVIDER_SITE_OTHER)
Admission: RE | Admit: 2018-09-06 | Discharge: 2018-09-06 | Disposition: A | Discharge status: Home / Self Care | Payer: PPO | Source: Ambulatory Visit | Attending: Adult Health | Admitting: Adult Health

## 2018-09-06 VITALS — BP 132/84 | HR 84 | Ht 64.0 in | Wt 138.8 lb

## 2018-09-06 VITALS — BP 148/82 | HR 73 | Temp 98.3°F | Wt 139.8 lb

## 2018-09-06 DIAGNOSIS — M19072 Primary osteoarthritis, left ankle and foot: Secondary | ICD-10-CM | POA: Diagnosis not present

## 2018-09-06 DIAGNOSIS — I10 Essential (primary) hypertension: Secondary | ICD-10-CM

## 2018-09-06 DIAGNOSIS — I480 Paroxysmal atrial fibrillation: Secondary | ICD-10-CM | POA: Diagnosis not present

## 2018-09-06 DIAGNOSIS — M79672 Pain in left foot: Secondary | ICD-10-CM

## 2018-09-06 MED ORDER — DILTIAZEM HCL 30 MG PO TABS: ORAL_TABLET | ORAL | 1 refills | Status: DC

## 2018-09-06 MED ORDER — APIXABAN 5 MG PO TABS: 5.0000 mg | ORAL_TABLET | Freq: Two times a day (BID) | ORAL | 3 refills | Status: DC

## 2018-09-06 NOTE — Progress Notes (Signed)
Subjective:    Patient ID: Margaret Mckenzie, female    DOB: 1951-10-06, 67 y.o.   MRN: [id number]  HPI 67 year old female who  has a past medical history of Bipolar 1 disorder ([place]), Chicken pox, Hypertension, Kidney disease, and PAF (paroxysmal atrial fibrillation) ([place]).  She presents to the office today for the acute complaint of left foot pain x 2 weeks. She does not recall injury. Works as a Educational psychologist. Pain is worse with ambulation and weightbearing. She has noticed swelling across the top of her foot and lateral side. Bruising noted that she first noticed today across toes.   No redness or warmth noted   She has been using Tylenol which helps relieve some of the pain    Review of Systems See HPI   Past Medical History:  Diagnosis Date  . Bipolar 1 disorder ([place])   . Chicken pox   . Hypertension    high blood pressure readings   . Kidney disease    kidney stones and saw urologist, Dr. Edward Qualia for lithotripsy remotely   . PAF (paroxysmal atrial fibrillation) ([facility])     Social History   Socioeconomic History  . Marital status: Divorced    Spouse name: Not on file  . Number of children: Not on file  . Years of education: Not on file  . Highest education level: Not on file  Occupational History  . Not on file  Social Needs  . Financial resource strain: Not on file  . Food insecurity:    Worry: Not on file    Inability: Not on file  . Transportation needs:    Medical: Not on file    Non-medical: Not on file  Tobacco Use  . Smoking status: Former Smoker    Packs/day: 0.50    Types: Cigarettes    Last attempt to quit: 07/11/2017    Years since quitting: 1.1  . Smokeless tobacco: Never Used  . Tobacco comment: pt has quit smoking  Substance and Sexual Activity  . Alcohol use: Yes    Comment: 1 glass of wine a night   . Drug use: No  . Sexual activity: Not on file  Lifestyle  . Physical activity:    Days per week: Not on file    Minutes per session: Not  on file  . Stress: Not on file  Relationships  . Social connections:    Talks on phone: Not on file    Gets together: Not on file    Attends religious service: Not on file    Active member of club or organization: Not on file    Attends meetings of clubs or organizations: Not on file    Relationship status: Not on file  . Intimate partner violence:    Fear of current or ex partner: Not on file    Emotionally abused: Not on file    Physically abused: Not on file    Forced sexual activity: Not on file  Other Topics Concern  . Not on file  Social History Narrative   Work or School: Educational psychologist at [employer] Situation: lives alone      Spiritual Beliefs: episcopalian      Lifestyle: had been going to Y but currently dealing with meniscal tear; diet good             Past Surgical History:  Procedure Laterality Date  . BUNIONECTOMY  2007  . TONSILLECTOMY AND ADENOIDECTOMY  1975    Family History  Problem Relation Age of Onset  . Osteoporosis Mother   . Stroke Mother        TIA, [place]  . Mental retardation Mother   . Hypertension Father   . Heart disease Father 54  . Stroke Father 9  . Mental illness Sister   . Breast cancer Neg Hx     No Known Allergies  Current Outpatient Medications on File Prior to Visit  Medication Sig Dispense Refill  . ALPRAZolam (XANAX) 0.25 MG tablet Take 0.25 mg by mouth daily as needed for anxiety or sleep.     Marland Kitchen apixaban (ELIQUIS) 5 MG TABS tablet Take 1 tablet (5 mg total) by mouth 2 (two) times daily. 180 tablet 3  . cholecalciferol (VITAMIN D) 1000 units tablet Take 1,000 Units by mouth daily.    Marland Kitchen diltiazem (CARDIZEM) 30 MG tablet Take 1 tablet every 4 hours AS NEEDED for AFIB heart rate >100 45 tablet 1  . lamoTRIgine (LAMICTAL) 150 MG tablet Take 300 mg by mouth at bedtime.     Marland Kitchen lisinopril-hydrochlorothiazide (ZESTORETIC) 10-12.5 MG tablet Take 1 tablet by mouth daily. 30 tablet 1  . Magnesium 500 MG  TABS Take 1 tablet by mouth at bedtime.    . risperiDONE (RISPERDAL) 3 MG tablet Take 3 mg by mouth at bedtime.    . traZODone (DESYREL) 50 MG tablet Take 50 mg by mouth at bedtime.     Current Facility-Administered Medications on File Prior to Visit  Medication Dose Route Frequency Provider Last Rate Last Dose  . 0.9 %  sodium chloride infusion  500 mL Intravenous Once Gatha Mayer, MD        BP (!) 148/82 (BP Location: Left Arm, Patient Position: Sitting, Cuff Size: Normal)   Pulse 73   Temp 98.3 F (36.8 C) (Oral)   Wt 139 lb 12.8 oz (63.4 kg)   SpO2 97%   BMI 24.00 kg/m       Objective:   Physical Exam  Constitutional: She is oriented to person, place, and time. She appears well-developed and well-nourished. No distress.  Musculoskeletal: Normal range of motion. She exhibits edema (trace edema noted across top of left foot. ).  Able to move all digits of left foot   Neurological: She is alert and oriented to person, place, and time.  Skin: Skin is warm and dry. She is not diaphoretic.  Bruising noted along metatarsals and proximal phalanges.   Psychiatric: She has a normal mood and affect. Her behavior is normal. Judgment and thought content normal.  Nursing note and vitals reviewed.     Assessment & Plan:  1. Left foot pain - Concern for stress fracture.  - Placed in hard sole surgical shoe - Will get xray.  - Continue with tylenol, ice, and elevation  - DG Foot Complete Left; Future   Dorothyann Peng, NP

## 2018-09-06 NOTE — Patient Instructions (Signed)
Medication Instructions:  Continue current medications  If you need a refill on your cardiac medications before your next appointment, please call your pharmacy.  Labwork: None Ordered   If you have labs (blood work) drawn today and your tests are completely normal, you will receive your results only by: Marland Kitchen MyChart Message (if you have MyChart) OR . A paper copy in the mail If you have any lab test that is abnormal or we need to change your treatment, we will call you to review the results.  Testing/Procedures: None Ordered  Follow-Up: You will need a follow up appointment in 1 Year.  Please call our office 2 months in [phone]) to schedule the appointment.  You may see  DR Percival Spanish or one of the following Advanced Practice Providers on your designated Care Team:   . Rosaria Ferries, PA-C . Jory Sims, DNP, ANP  At [facility], you and your health needs are our priority.  As part of our continuing mission to provide you with exceptional heart care, we have created designated Provider Care Teams.  These Care Teams include your primary Cardiologist (physician) and Advanced Practice Providers (APPs -  Physician Assistants and Nurse Practitioners) who all work together to provide you with the care you need, when you need it.    Thank you for choosing [facility] at [facility]!!

## 2018-09-09 ENCOUNTER — Telehealth: Payer: Self-pay | Admitting: Adult Health

## 2018-09-09 NOTE — Telephone Encounter (Signed)
Patient is calling to get her results of X-ray

## 2018-09-09 NOTE — Telephone Encounter (Signed)
I do not see that Dr. Maudie Mercury has resulted the x-ray.

## 2018-09-09 NOTE — Progress Notes (Signed)
Subjective:   Margaret Mckenzie is a 67 y.o. female who presents for Medicare Annual (Subsequent) preventive examination.  Reports health as very good  A fib under good control  Sees cardiology one time a year  Lives alone   Diet Eat fish and vegetables and occasional  Sweet  Red meat; 3 times a month  Does not eat fruits   Exercise Walk a mile a day Work out on the [employer] Maintenance Due  Topic Date Due  . PNA vac Low Risk Adult (2 of 2 - PPSV23) 09/03/2018   Colonoscopy 01/2018  Mammogram 07/2017 - just had last month;   Dexa 08/2017 -1.9  Mother had osteoporosis   shingrix 04/2017   Will take Flu vaccine , high dose today  will take the PSV 23 today    Cardiac Risk Factors include: advanced age (>25men, >1 women);hypertension;family history of premature cardiovascular disease     Objective:     Vitals: BP 118/80   Pulse 77   Ht 5\' 4"  (1.626 m)   Wt 140 lb (63.5 kg)   SpO2 97%   BMI 24.03 kg/m   Body mass index is 24.03 kg/m.  Advanced Directives 09/10/2018 06/11/2017  Does Patient Have a Medical Advance Directive? No No   Has the paper work currently   Tobacco Social History   Tobacco Use  Smoking Status Former Smoker  . Packs/day: 0.50  . Types: Cigarettes  . Last attempt to quit: 07/11/2017  . Years since quitting: 1.1  Smokeless Tobacco Never Used  Tobacco Comment   pt has quit smoking     Counseling given: Not Answered Comment: pt has quit smoking to note, stated she no longer smoked Would not meet 30 pack hx per information given   Clinical Intake:     Past Medical History:  Diagnosis Date  . Bipolar 1 disorder ([place])   . Chicken pox   . Hypertension    high blood pressure readings   . Kidney disease    kidney stones and saw urologist, Dr. Edward Mckenzie for lithotripsy remotely   . PAF (paroxysmal atrial fibrillation) ([place])    Past Surgical History:  Procedure Laterality Date  . BUNIONECTOMY  2007  .  TONSILLECTOMY AND ADENOIDECTOMY  1975   Family History  Problem Relation Age of Onset  . Osteoporosis Mother   . Stroke Mother        TIA, [place]  . Mental retardation Mother   . Hypertension Father   . Heart disease Father 21  . Stroke Father 61  . Mental illness Sister   . Breast cancer Neg Hx    Social History   Socioeconomic History  . Marital status: Divorced    Spouse name: Not on file  . Number of children: Not on file  . Years of education: Not on file  . Highest education level: Not on file  Occupational History  . Not on file  Social Needs  . Financial resource strain: Not on file  . Food insecurity:    Worry: Not on file    Inability: Not on file  . Transportation needs:    Medical: Not on file    Non-medical: Not on file  Tobacco Use  . Smoking status: Former Smoker    Packs/day: 0.50    Types: Cigarettes    Last attempt to quit: 07/11/2017    Years since quitting: 1.1  . Smokeless tobacco: Never Used  . Tobacco comment: pt has  quit smoking  Substance and Sexual Activity  . Alcohol use: Yes    Comment: 1 glass of wine a night   . Drug use: No  . Sexual activity: Not on file  Lifestyle  . Physical activity:    Days per week: Not on file    Minutes per session: Not on file  . Stress: Not on file  Relationships  . Social connections:    Talks on phone: Not on file    Gets together: Not on file    Attends religious service: Not on file    Active member of club or organization: Not on file    Attends meetings of clubs or organizations: Not on file    Relationship status: Not on file  Other Topics Concern  . 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 ALPRAZolam (XANAX) 0.25 MG tablet Take 0.25 mg by mouth daily as needed for anxiety or sleep.   Marland Kitchen apixaban (ELIQUIS) 5 MG TABS tablet Take 1 tablet (5 mg total) by mouth 2 (two) times daily.  . Calcium Carbonate-Vit D-Min (CALCIUM 1200 PO) Take by mouth.  . cholecalciferol (VITAMIN D) 1000 units tablet Take 1,000 Units by mouth daily.  Marland Kitchen diltiazem (CARDIZEM) 30 MG tablet Take 1 tablet every 4 hours AS NEEDED for AFIB heart rate >100  . lamoTRIgine (LAMICTAL) 150 MG tablet Take 300 mg by mouth at bedtime.   Marland Kitchen lisinopril-hydrochlorothiazide (ZESTORETIC) 10-12.5 MG tablet Take 1 tablet by mouth daily.  . Magnesium 500 MG TABS Take 1 tablet by mouth at bedtime.  . risperiDONE (RISPERDAL) 3 MG tablet Take 3 mg by mouth at bedtime.  . traZODone (DESYREL) 50 MG tablet Take 50 mg by mouth at bedtime.   Facility-Administered Encounter Medications as of 09/10/2018  Medication  . 0.9 %  sodium chloride infusion    Activities of Daily Living In your present state of health, do you have any difficulty performing the following activities: 09/10/2018  Hearing? N  Vision? N  Difficulty concentrating or making decisions? N  Walking or climbing stairs? N  Dressing or bathing? N  Doing errands, shopping? N  Preparing Food and eating ? N  Using the Toilet? N  In the past six months, have you accidently leaked urine? N  Do you have problems with loss of bowel control? N  Managing your Medications? N  Managing your Finances? N  Housekeeping or managing your Housekeeping? N  Some recent data might be hidden    Patient Care Team: Margaret Kern, DO as PCP - General (Family Medicine) Group, [facility] ([place]) Margaret Breeding, MD as Consulting Physician (Cardiology)    Assessment:   This is a routine wellness examination for [name].  Exercise Activities and Dietary recommendations Current Exercise Habits: Structured exercise class, Time (Minutes): 60, Frequency (Times/Week):  4, Weekly Exercise (Minutes/Week): 240, Intensity: Moderate  Goals    . Patient Stated     Will keep working; and keep exercising        Fall Risk Fall Risk  09/10/2018 09/03/2017 08/04/2013  Falls in the past year?  No No No     Depression Screen PHQ 2/9 Scores 09/10/2018 09/03/2017 08/04/2013  PHQ - 2 Score 0 0 0     Cognitive Function MMSE - Mini Mental State Exam 09/10/2018  Not completed: (No Data)   Ad8 score reviewed for issues:  Issues making decisions:  Less interest in hobbies / activities:  Repeats questions, stories (family complaining):  Trouble using ordinary gadgets (microwave, computer, phone):  Forgets the month or year:   Mismanaging finances:   Remembering appts:  Daily problems with thinking and/or memory: Ad8 score is=0          Immunization History  Administered Date(s) Administered  . Influenza, High Dose Seasonal PF 09/03/2017, 09/10/2018  . Influenza,inj,Quad PF,6+ Mos 08/04/2013  . Pneumococcal Conjugate-13 09/03/2017  . Pneumococcal Polysaccharide-23 09/10/2018  . Tdap 06/17/2013  . Zoster 08/14/2017  . Zoster Recombinat (Shingrix) 04/27/2017, 11/27/2017    Screening Tests Health Maintenance  Topic Date Due  . PNA vac Low Risk Adult (2 of 2 - PPSV23) 09/03/2018  . INFLUENZA VACCINE  02/26/2019 (Originally 06/27/2018)  . MAMMOGRAM  08/26/2020  . TETANUS/TDAP  06/18/2023  . COLONOSCOPY  02/22/2028  . DEXA SCAN  Completed  . Hepatitis C Screening  Completed        Plan:      PCP Notes   Health Maintenance Colonoscopy 01/2018  Mammogram 07/2017 - just had last month;   Dexa 08/2017 -1.9  Mother had osteoporosis   shingrix 04/2017   Will take Flu vaccine , high dose today  will take the PSV 23 today     Abnormal Screens  none  Referrals  none  Patient concerns; Recently seen [facility] for pain in left foot , metartarsal. Cory did xray, no fx and wearing boot. Continue to ice and elevate when you can. Work on  her feet x 5 hour 5 days a week. Will make an apt for Thursday afternoon if still hurting or fup evaluation   Nurse Concerns; As noted  Next PCP apt Will make an apt with Dr. Maudie Mercury to evaluate foot. Will schedule her medical / labs fup  Will schedule Dr. Maudie Mercury and AWV 2020 after her visit with Dr. Maudie Mercury this year       I have personally reviewed and noted the following in the patient's chart:   . Medical and social history . Use of alcohol, tobacco or illicit drugs  . Current medications and supplements . Functional ability and status . Nutritional status . Physical activity . Advanced directives . List of other physicians . Hospitalizations, surgeries, and ER visits in previous 12 months . Vitals . Screenings to include cognitive, depression, and falls . Referrals and appointments  In addition, I have reviewed and discussed with patient certain preventive protocols, quality metrics, and best practice recommendations. A written personalized care plan for preventive services as well as general preventive health recommendations were provided to patient.     Wynetta Fines, RN  09/10/2018

## 2018-09-09 NOTE — Telephone Encounter (Signed)
Copied from [place] [id number]. Topic: Quick Communication - See Telephone Encounter >> Sep 09, 2018 12:30 PM Sheran Luz wrote: CRM for notification. See Telephone encounter for: 09/09/18.  Pt called to check status of imaging results from 10/11.

## 2018-09-10 ENCOUNTER — Ambulatory Visit (INDEPENDENT_AMBULATORY_CARE_PROVIDER_SITE_OTHER): Payer: PPO

## 2018-09-10 DIAGNOSIS — Z0000 Encounter for general adult medical examination without abnormal findings: Secondary | ICD-10-CM

## 2018-09-10 DIAGNOSIS — Z23 Encounter for immunization: Secondary | ICD-10-CM

## 2018-09-10 NOTE — Patient Instructions (Addendum)
Margaret Mckenzie , Thank you for taking time to come for your Medicare Wellness Visit. I appreciate your ongoing commitment to your health goals. Please review the following plan we discussed and let me know if I can assist you in the future.   Advised to make an apt on Thursday with Dr Maudie Mercury of foot is still painful or unable to walk.  You will also need to schedule annual exam or medical fup   Remember to schedule your eye exam   You took your last pneumonia vaccine today as well as your high does flu vaccine  Recommendations for Dexa Scan Female over the age of 81 Man age 33 or older If you broke a bone past the age of 47 Women menopausal age with risk factors (thin frame; smoker; hx of fx ) Post menopausal women under the age of 19 with risk factors A man age 51 to 14 with risk factors Other: Spine xray that is showing break of bone loss Back pain with possible break Height loss of 1/2 inch or more within one year Total loss in height of 1.5 inches from your original height  Calcium 1232m with Vit D 800u per day; more as directed by physician Strength building exercises discussed; can include walking; housework; small weights or stretch bands; silver sneakers [facility]  Please visit the osteoporosis foundation.org for up to date recommendations     These are the goals we discussed: Goals    . Patient Stated     Will keep working; and keep exercising        This is a list of the screening recommended for you and due dates:  Health Maintenance  Topic Date Due  . Pneumonia vaccines (2 of 2 - PPSV23) 09/03/2018  . Flu Shot  02/26/2019*  . Mammogram  08/26/2020  . Tetanus Vaccine  06/18/2023  . Colon Cancer Screening  02/22/2028  . DEXA scan (bone density measurement)  Completed  .  Hepatitis C: One time screening is recommended by Center for Disease Control  (CDC) for  adults born from [birth year]through 1964.   Completed  *Topic was postponed. The date shown is not  the original due date.     Bone Densitometry Bone densitometry is an imaging test that uses a special X-ray to measure the amount of calcium and other minerals in your bones (bone density). This test is also known as a bone mineral density test or dual-energy X-ray absorptiometry (DXA). The test can measure bone density at your hip and your spine. It is similar to having a regular X-ray. You may have this test to:  Diagnose a condition that causes weak or thin bones (osteoporosis).  Predict your risk of a broken bone (fracture).  Determine how well osteoporosis treatment is working.  Tell a health care provider about:  Any allergies you have.  All medicines you are taking, including vitamins, herbs, eye drops, creams, and over-the-counter medicines.  Any problems you or family members have had with anesthetic medicines.  Any blood disorders you have.  Any surgeries you have had.  Any medical conditions you have.  Possibility of pregnancy.  Any other medical test you had within the previous 14 days that used contrast material. What are the risks? Generally, this is a safe procedure. However, problems can occur and may include the following:  This test exposes you to a very small amount of radiation.  The risks of radiation exposure may be greater to unborn  children.  What happens before the procedure?  Do not take any calcium supplements for 24 hours before having the test. You can otherwise eat and drink what you usually do.  Take off all metal jewelry, eyeglasses, dental appliances, and any other metal objects. What happens during the procedure?  You may lie on an exam table. There will be an X-ray generator below you and an imaging device above you.  Other devices, such as boxes or braces, may be used to position your body properly for the scan.  You will need to lie still while the machine slowly scans your body.  The images will show up on a computer  monitor. What happens after the procedure? You may need more testing at a later time. This information is not intended to replace advice given to you by your health care provider. Make sure you discuss any questions you have with your health care provider. Document Released: 12/05/2004 Document Revised: 04/20/2016 Document Reviewed: 04/23/2014 Elsevier Interactive Patient Education  2018 [place] in the Home Falls can cause injuries. They can happen to people of all ages. There are many things you can do to make your home safe and to help prevent falls. What can I do on the outside of my home?  Regularly fix the edges of walkways and driveways and fix any cracks.  Remove anything that might make you trip as you walk through a door, such as a raised step or threshold.  Trim any bushes or trees on the path to your home.  Use bright outdoor lighting.  Clear any walking paths of anything that might make someone trip, such as rocks or tools.  Regularly check to see if handrails are loose or broken. Make sure that both sides of any steps have handrails.  Any raised decks and porches should have guardrails on the edges.  Have any leaves, snow, or ice cleared regularly.  Use sand or salt on walking paths during winter.  Clean up any spills in your garage right away. This includes oil or grease spills. What can I do in the bathroom?  Use night lights.  Install grab bars by the toilet and in the tub and shower. Do not use towel bars as grab bars.  Use non-skid mats or decals in the tub or shower.  If you need to sit down in the shower, use a plastic, non-slip stool.  Keep the floor dry. Clean up any water that spills on the floor as soon as it happens.  Remove soap buildup in the tub or shower regularly.  Attach bath mats securely with double-sided non-slip rug tape.  Do not have throw rugs and other things on the floor that can make you trip. What can I  do in the bedroom?  Use night lights.  Make sure that you have a light by your bed that is easy to reach.  Do not use any sheets or blankets that are too big for your bed. They should not hang down onto the floor.  Have a firm chair that has side arms. You can use this for support while you get dressed.  Do not have throw rugs and other things on the floor that can make you trip. What can I do in the kitchen?  Clean up any spills right away.  Avoid walking on wet floors.  Keep items that you use a lot in easy-to-reach places.  If you need to reach something above you, use  a strong step stool that has a grab bar.  Keep electrical cords out of the way.  Do not use floor polish or wax that makes floors slippery. If you must use wax, use non-skid floor wax.  Do not have throw rugs and other things on the floor that can make you trip. What can I do with my stairs?  Do not leave any items on the stairs.  Make sure that there are handrails on both sides of the stairs and use them. Fix handrails that are broken or loose. Make sure that handrails are as long as the stairways.  Check any carpeting to make sure that it is firmly attached to the stairs. Fix any carpet that is loose or worn.  Avoid having throw rugs at the top or bottom of the stairs. If you do have throw rugs, attach them to the floor with carpet tape.  Make sure that you have a light switch at the top of the stairs and the bottom of the stairs. If you do not have them, ask someone to add them for you. What else can I do to help prevent falls?  Wear shoes that: ? Do not have high heels. ? Have rubber bottoms. ? Are comfortable and fit you well. ? Are closed at the toe. Do not wear sandals.  If you use a stepladder: ? Make sure that it is fully opened. Do not climb a closed stepladder. ? Make sure that both sides of the stepladder are locked into place. ? Ask someone to hold it for you, if possible.  Clearly mark  and make sure that you can see: ? Any grab bars or handrails. ? First and last steps. ? Where the edge of each step is.  Use tools that help you move around (mobility aids) if they are needed. These include: ? Canes. ? Walkers. ? Scooters. ? Crutches.  Turn on the lights when you go into a dark area. Replace any light bulbs as soon as they burn out.  Set up your furniture so you have a clear path. Avoid moving your furniture around.  If any of your floors are uneven, fix them.  If there are any pets around you, be aware of where they are.  Review your medicines with your doctor. Some medicines can make you feel dizzy. This can increase your chance of falling. Ask your doctor what other things that you can do to help prevent falls. This information is not intended to replace advice given to you by your health care provider. Make sure you discuss any questions you have with your health care provider. Document Released: 09/09/2009 Document Revised: 04/20/2016 Document Reviewed: 12/18/2014 Elsevier Interactive Patient Education  2018 [place] Maintenance, Female Adopting a healthy lifestyle and getting preventive care can go a long way to promote health and wellness. Talk with your health care provider about what schedule of regular examinations is right for you. This is a good chance for you to check in with your provider about disease prevention and staying healthy. In between checkups, there are plenty of things you can do on your own. Experts have done a lot of research about which lifestyle changes and preventive measures are most likely to keep you healthy. Ask your health care provider for more information. Weight and diet Eat a healthy diet  Be sure to include plenty of vegetables, fruits, low-fat dairy products, and lean protein.  Do not eat a lot of foods high in  solid fats, added sugars, or salt.  Get regular exercise. This is one of the most important things  you can do for your health. ? Most adults should exercise for at least 150 minutes each week. The exercise should increase your heart rate and make you sweat (moderate-intensity exercise). ? Most adults should also do strengthening exercises at least twice a week. This is in addition to the moderate-intensity exercise.  Maintain a healthy weight  Body mass index (BMI) is a measurement that can be used to identify possible weight problems. It estimates body fat based on height and weight. Your health care provider can help determine your BMI and help you achieve or maintain a healthy weight.  For females 65 years of age and older: ? A BMI below 18.5 is considered underweight. ? A BMI of 18.5 to 24.9 is normal. ? A BMI of 25 to 29.9 is considered overweight. ? A BMI of 30 and above is considered obese.  Watch levels of cholesterol and blood lipids  You should start having your blood tested for lipids and cholesterol at 66 years of age, then have this test every 5 years.  You may need to have your cholesterol levels checked more often if: ? Your lipid or cholesterol levels are high. ? You are older than 66 years of age. ? You are at high risk for heart disease.  Cancer screening Lung Cancer  Lung cancer screening is recommended for adults 65-67 years old who are at high risk for lung cancer because of a history of smoking.  A yearly low-dose CT scan of the lungs is recommended for people who: ? Currently smoke. ? Have quit within the past 15 years. ? Have at least a 30-pack-year history of smoking. A pack year is smoking an average of one pack of cigarettes a day for 1 year.  Yearly screening should continue until it has been 15 years since you quit.  Yearly screening should stop if you develop a health problem that would prevent you from having lung cancer treatment.  Breast Cancer  Practice breast self-awareness. This means understanding how your breasts normally appear and  feel.  It also means doing regular breast self-exams. Let your health care provider know about any changes, no matter how small.  If you are in your 20s or 30s, you should have a clinical breast exam (CBE) by a health care provider every 1-3 years as part of a regular health exam.  If you are 25 or older, have a CBE every year. Also consider having a breast X-ray (mammogram) every year.  If you have a family history of breast cancer, talk to your health care provider about genetic screening.  If you are at high risk for breast cancer, talk to your health care provider about having an MRI and a mammogram every year.  Breast cancer gene (BRCA) assessment is recommended for women who have family members with BRCA-related cancers. BRCA-related cancers include: ? Breast. ? Ovarian. ? Tubal. ? Peritoneal cancers.  Results of the assessment will determine the need for genetic counseling and BRCA1 and BRCA2 testing.  Cervical Cancer Your health care provider may recommend that you be screened regularly for cancer of the pelvic organs (ovaries, uterus, and vagina). This screening involves a pelvic examination, including checking for microscopic changes to the surface of your cervix (Pap test). You may be encouraged to have this screening done every 3 years, beginning at age 34.  For women ages 60-65, health care  providers may recommend pelvic exams and Pap testing every 3 years, or they may recommend the Pap and pelvic exam, combined with testing for human papilloma virus (HPV), every 5 years. Some types of HPV increase your risk of cervical cancer. Testing for HPV may also be done on women of any age with unclear Pap test results.  Other health care providers may not recommend any screening for nonpregnant women who are considered low risk for pelvic cancer and who do not have symptoms. Ask your health care provider if a screening pelvic exam is right for you.  If you have had past treatment for  cervical cancer or a condition that could lead to cancer, you need Pap tests and screening for cancer for at least 20 years after your treatment. If Pap tests have been discontinued, your risk factors (such as having a new sexual partner) need to be reassessed to determine if screening should resume. Some women have medical problems that increase the chance of getting cervical cancer. In these cases, your health care provider may recommend more frequent screening and Pap tests.  Colorectal Cancer  This type of cancer can be detected and often prevented.  Routine colorectal cancer screening usually begins at 66 years of age and continues through 66 years of age.  Your health care provider may recommend screening at an earlier age if you have risk factors for colon cancer.  Your health care provider may also recommend using home test kits to check for hidden blood in the stool.  A small camera at the end of a tube can be used to examine your colon directly (sigmoidoscopy or colonoscopy). This is done to check for the earliest forms of colorectal cancer.  Routine screening usually begins at age 88.  Direct examination of the colon should be repeated every 5-10 years through 66 years of age. However, you may need to be screened more often if early forms of precancerous polyps or small growths are found.  Skin Cancer  Check your skin from head to toe regularly.  Tell your health care provider about any new moles or changes in moles, especially if there is a change in a mole's shape or color.  Also tell your health care provider if you have a mole that is larger than the size of a pencil eraser.  Always use sunscreen. Apply sunscreen liberally and repeatedly throughout the day.  Protect yourself by wearing long sleeves, pants, a wide-brimmed hat, and sunglasses whenever you are outside.  Heart disease, diabetes, and high blood pressure  High blood pressure causes heart disease and increases  the risk of stroke. High blood pressure is more likely to develop in: ? People who have blood pressure in the high end of the normal range (130-139/85-89 mm Hg). ? People who are overweight or obese. ? People who are African American.  If you are 27-48 years of age, have your blood pressure checked every 3-5 years. If you are 28 years of age or older, have your blood pressure checked every year. You should have your blood pressure measured twice-once when you are at a hospital or clinic, and once when you are not at a hospital or clinic. Record the average of the two measurements. To check your blood pressure when you are not at a hospital or clinic, you can use: ? An automated blood pressure machine at a pharmacy. ? A home blood pressure monitor.  If you are between 42 years and 63 years old, ask your  health care provider if you should take aspirin to prevent strokes.  Have regular diabetes screenings. This involves taking a blood sample to check your fasting blood sugar level. ? If you are at a normal weight and have a low risk for diabetes, have this test once every three years after 66 years of age. ? If you are overweight and have a high risk for diabetes, consider being tested at a younger age or more often. Preventing infection Hepatitis B  If you have a higher risk for hepatitis B, you should be screened for this virus. You are considered at high risk for hepatitis B if: ? You were born in a country where hepatitis B is common. Ask your health care provider which countries are considered high risk. ? Your parents were born in a high-risk country, and you have not been immunized against hepatitis B (hepatitis B vaccine). ? You have HIV or AIDS. ? You use needles to inject street drugs. ? You live with someone who has hepatitis B. ? You have had sex with someone who has hepatitis B. ? You get hemodialysis treatment. ? You take certain medicines for conditions, including cancer, organ  transplantation, and autoimmune conditions.  Hepatitis C  Blood testing is recommended for: ? Everyone born from [birth year] through 1964. ? Anyone with known risk factors for hepatitis C.  Sexually transmitted infections (STIs)  You should be screened for sexually transmitted infections (STIs) including gonorrhea and chlamydia if: ? You are sexually active and are younger than 66 years of age. ? You are older than 66 years of age and your health care provider tells you that you are at risk for this type of infection. ? Your sexual activity has changed since you were last screened and you are at an increased risk for chlamydia or gonorrhea. Ask your health care provider if you are at risk.  If you do not have HIV, but are at risk, it may be recommended that you take a prescription medicine daily to prevent HIV infection. This is called pre-exposure prophylaxis (PrEP). You are considered at risk if: ? You are sexually active and do not regularly use condoms or know the HIV status of your partner(s). ? You take drugs by injection. ? You are sexually active with a partner who has HIV.  Talk with your health care provider about whether you are at high risk of being infected with HIV. If you choose to begin PrEP, you should first be tested for HIV. You should then be tested every 3 months for as long as you are taking PrEP. Pregnancy  If you are premenopausal and you may become pregnant, ask your health care provider about preconception counseling.  If you may become pregnant, take 400 to 800 micrograms (mcg) of folic acid every day.  If you want to prevent pregnancy, talk to your health care provider about birth control (contraception). Osteoporosis and menopause  Osteoporosis is a disease in which the bones lose minerals and strength with aging. This can result in serious bone fractures. Your risk for osteoporosis can be identified using a bone density scan.  If you are 60 years of age or  older, or if you are at risk for osteoporosis and fractures, ask your health care provider if you should be screened.  Ask your health care provider whether you should take a calcium or vitamin D supplement to lower your risk for osteoporosis.  Menopause may have certain physical symptoms and risks.  Hormone replacement  therapy may reduce some of these symptoms and risks. Talk to your health care provider about whether hormone replacement therapy is right for you. Follow these instructions at home:  Schedule regular health, dental, and eye exams.  Stay current with your immunizations.  Do not use any tobacco products including cigarettes, chewing tobacco, or electronic cigarettes.  If you are pregnant, do not drink alcohol.  If you are breastfeeding, limit how much and how often you drink alcohol.  Limit alcohol intake to no more than 1 drink per day for nonpregnant women. One drink equals 12 ounces of beer, 5 ounces of wine, or 1 ounces of hard liquor.  Do not use street drugs.  Do not share needles.  Ask your health care provider for help if you need support or information about quitting drugs.  Tell your health care provider if you often feel depressed.  Tell your health care provider if you have ever been abused or do not feel safe at home. This information is not intended to replace advice given to you by your health care provider. Make sure you discuss any questions you have with your health care provider. Document Released: 05/29/2011 Document Revised: 04/20/2016 Document Reviewed: 08/17/2015 Elsevier Interactive Patient Education  [employer].

## 2018-09-10 NOTE — Progress Notes (Signed)
Kaityln Kallstrom R Yeiren Whitecotton, DO  

## 2018-09-12 ENCOUNTER — Ambulatory Visit: Payer: PPO | Admitting: Family Medicine

## 2018-10-28 ENCOUNTER — Other Ambulatory Visit: Payer: Self-pay | Admitting: Cardiology

## 2018-11-19 ENCOUNTER — Encounter (HOSPITAL_COMMUNITY): Payer: Self-pay | Admitting: Emergency Medicine

## 2018-11-19 ENCOUNTER — Emergency Department (HOSPITAL_COMMUNITY)
Admission: EM | Admit: 2018-11-19 | Discharge: 2018-11-19 | Disposition: A | Discharge status: Home / Self Care | Payer: PPO | Attending: Emergency Medicine | Admitting: Emergency Medicine

## 2018-11-19 ENCOUNTER — Emergency Department (HOSPITAL_COMMUNITY): Payer: PPO

## 2018-11-19 DIAGNOSIS — R000 Tachycardia, unspecified: Secondary | ICD-10-CM | POA: Diagnosis not present

## 2018-11-19 DIAGNOSIS — R002 Palpitations: Secondary | ICD-10-CM | POA: Diagnosis not present

## 2018-11-19 DIAGNOSIS — Z87891 Personal history of nicotine dependence: Secondary | ICD-10-CM | POA: Diagnosis not present

## 2018-11-19 DIAGNOSIS — I10 Essential (primary) hypertension: Secondary | ICD-10-CM | POA: Diagnosis not present

## 2018-11-19 DIAGNOSIS — Z79899 Other long term (current) drug therapy: Secondary | ICD-10-CM | POA: Diagnosis not present

## 2018-11-19 DIAGNOSIS — Z7901 Long term (current) use of anticoagulants: Secondary | ICD-10-CM | POA: Diagnosis not present

## 2018-11-19 LAB — BASIC METABOLIC PANEL
Anion gap: 13 (ref 5–15)
BUN: 11 mg/dL (ref 8–23)
CHLORIDE: 92 mmol/L — AB (ref 98–111)
CO2: 25 mmol/L (ref 22–32)
Calcium: 9.6 mg/dL (ref 8.9–10.3)
Creatinine, Ser: 0.84 mg/dL (ref 0.44–1.00)
GFR calc Af Amer: >60 mL/min (ref >60)
Glucose, Bld: 144 mg/dL — ABNORMAL HIGH (ref 70–99)
POTASSIUM: 3.9 mmol/L (ref 3.5–5.1)
SODIUM: 130 mmol/L — AB (ref 135–145)

## 2018-11-19 LAB — CBC
HEMATOCRIT: 40 % (ref 36.0–46.0)
Hemoglobin: 13 g/dL (ref 12.0–15.0)
MCH: 30 pg (ref 26.0–34.0)
MCHC: 32.5 g/dL (ref 30.0–36.0)
MCV: 92.4 fL (ref 80.0–100.0)
Platelets: 282 10*3/uL (ref 150–400)
RBC: 4.33 MIL/uL (ref 3.87–5.11)
RDW: 12.2 % (ref 11.5–15.5)
WBC: 7.2 10*3/uL (ref 4.0–10.5)
nRBC: 0 % (ref 0.0–0.2)

## 2018-11-19 LAB — I-STAT TROPONIN, ED: Troponin i, poc: 0.01 ng/mL (ref 0.00–0.08)

## 2018-11-19 NOTE — ED Provider Notes (Signed)
[facility] DEPT Provider Note   CSN: [id number] Arrival date & time: 11/19/18  0700     History   Chief Complaint Chief Complaint  Patient presents with  . Tachycardia    HPI Margaret Mckenzie is a 67 y.o. female.  The history is provided by the patient.  Illness  This is a recurrent problem. The current episode started 3 to 5 hours ago. The problem occurs constantly. The problem has not changed since onset.Pertinent negatives include no chest pain, no abdominal pain, no headaches and no shortness of breath. Nothing aggravates the symptoms. Relieved by: diltazem. She has tried rest (diltazem) for the symptoms. The treatment provided significant relief.    Past Medical History:  Diagnosis Date  . Bipolar 1 disorder ([place])   . Chicken pox   . Hypertension    high blood pressure readings   . Kidney disease    kidney stones and saw urologist, Dr. Edward Qualia for lithotripsy remotely   . PAF (paroxysmal atrial fibrillation) [facility])     Patient Active Problem List   Diagnosis Date Noted  . Paroxysmal atrial fibrillation ([place]) 08/31/2017  . Essential hypertension 08/31/2017    Past Surgical History:  Procedure Laterality Date  . BUNIONECTOMY  2007  . TONSILLECTOMY AND ADENOIDECTOMY  1975     OB History   No obstetric history on file.      Home Medications    Prior to Admission medications   Medication Sig Start Date End Date Taking? Authorizing Provider  ALPRAZolam Duanne Moron) 0.25 MG tablet Take 0.25 mg by mouth daily as needed for anxiety or sleep.    Yes Rosamaria Lints, MD  apixaban (ELIQUIS) 5 MG TABS tablet Take 1 tablet (5 mg total) by mouth 2 (two) times daily. 09/06/18  Yes Minus Breeding, MD  Calcium Carbonate-Vit D-Min (CALCIUM 1200 PO) Take by mouth.   Yes [provider]  cholecalciferol (VITAMIN D) 1000 units tablet Take 1,000 Units by mouth daily.   Yes [provider]  diltiazem (CARDIZEM) 30 MG  tablet Take 1 tablet every 4 hours AS NEEDED for AFIB heart rate >100 09/06/18  Yes Minus Breeding, MD  lamoTRIgine (LAMICTAL) 150 MG tablet Take 300 mg by mouth at bedtime.    Yes Rosamaria Lints, MD  lisinopril-hydrochlorothiazide (PRINZIDE,ZESTORETIC) 10-12.5 MG tablet TAKE ONE TABLET BY MOUTH ONE TIME DAILY  10/28/18  Yes Minus Breeding, MD  Magnesium 500 MG TABS Take 1 tablet by mouth at bedtime.   Yes [provider]  risperiDONE (RISPERDAL) 3 MG tablet Take 3 mg by mouth at bedtime.   Yes [provider]  traZODone (DESYREL) 50 MG tablet Take 50 mg by mouth at bedtime.   Yes [provider]    Family History Family History  Problem Relation Age of Onset  . Osteoporosis Mother   . Stroke Mother        TIA, [place]  . Mental retardation Mother   . Hypertension Father   . Heart disease Father 45  . Stroke Father 52  . Mental illness Sister   . Breast cancer Neg Hx     Social History Social History   Tobacco Use  . Smoking status: Former Smoker    Packs/day: 0.50    Types: Cigarettes    Last attempt to quit: 07/11/2017    Years since quitting: 1.3  . Smokeless tobacco: Never Used  . Tobacco comment: pt has quit smoking  Substance Use Topics  . Alcohol  use: Yes    Comment: 1 glass of wine a night   . Drug use: No     Allergies   Patient has no known allergies.   Review of Systems Review of Systems  Constitutional: Negative for chills and fever.  HENT: Negative for ear pain and sore throat.   Eyes: Negative for pain and visual disturbance.  Respiratory: Negative for cough and shortness of breath.   Cardiovascular: Positive for palpitations. Negative for chest pain.  Gastrointestinal: Negative for abdominal pain and vomiting.  Genitourinary: Negative for dysuria and hematuria.  Musculoskeletal: Negative for arthralgias and back pain.  Skin: Negative for color change and rash.  Neurological: Negative for seizures, syncope and  headaches.  All other systems reviewed and are negative.    Physical Exam Updated Vital Signs  ED Triage Vitals [11/19/18 0718]  Enc Vitals Group     BP 125/87     Pulse Rate (!) 145     Resp (!) 22     Temp (!) 97.4 F (36.3 C)     Temp Source Oral     SpO2 100 %     Weight      Height      Head Circumference      Peak Flow      Pain Score 0     Pain Loc      Pain Edu?      Excl. in [place]?     Physical Exam Vitals signs and nursing note reviewed.  Constitutional:      General: She is not in acute distress.    Appearance: She is well-developed.  HENT:     Head: Normocephalic and atraumatic.     Nose: Nose normal.     Mouth/Throat:     Mouth: Mucous membranes are moist.  Eyes:     Extraocular Movements: Extraocular movements intact.     Conjunctiva/sclera: Conjunctivae normal.     Pupils: Pupils are equal, round, and reactive to light.  Neck:     Musculoskeletal: Normal range of motion and neck supple.  Cardiovascular:     Rate and Rhythm: Normal rate and regular rhythm.     Pulses: Normal pulses.     Heart sounds: Normal heart sounds. No murmur.  Pulmonary:     Effort: Pulmonary effort is normal. No respiratory distress.     Breath sounds: Normal breath sounds.  Abdominal:     Palpations: Abdomen is soft.     Tenderness: There is no abdominal tenderness.  Musculoskeletal: Normal range of motion.  Skin:    General: Skin is warm and dry.     Capillary Refill: Capillary refill takes less than 2 seconds.  Neurological:     General: No focal deficit present.     Mental Status: She is alert.  Psychiatric:        Mood and Affect: Mood normal.      ED Treatments / Results  Labs (all labs ordered are listed, but only abnormal results are displayed) Labs Reviewed  BASIC METABOLIC PANEL - Abnormal; Notable for the following components:      Result Value   Sodium 130 (*)    Chloride 92 (*)    Glucose, Bld 144 (*)    All other components within normal limits   CBC  I-STAT TROPONIN, ED    EKG EKG Interpretation  Date/Time:  Tuesday November 19 2018 09:03:06 EST Ventricular Rate:  83 PR Interval:    QRS Duration: 98 QT Interval:  356 QTC Calculation: 419 R Axis:   -15 Text Interpretation:  Sinus rhythm Borderline left axis deviation Confirmed by Lennice Sites [phone]) on 11/19/2018 9:14:12 AM   Radiology Dg Chest 2 View  Result Date: 11/19/2018 CLINICAL DATA:  Tachycardia EXAM: CHEST - 2 VIEW COMPARISON:  None. FINDINGS: Heart and mediastinal contours are within normal limits. No focal opacities or effusions. No acute bony abnormality. IMPRESSION: No active cardiopulmonary disease. Electronically Signed   By: Rolm Baptise M.D.   On: 11/19/2018 08:16    Procedures Procedures (including critical care time)  Medications Ordered in ED Medications - No data to display   Initial Impression / Assessment and Plan / ED Course  I have reviewed the triage vital signs and the nursing notes.  Pertinent labs & imaging results that were available during my care of the patient were reviewed by me and considered in my medical decision making (see chart for details).     Kersten Salmons is a 67 year old female with history of paroxysmal atrial fibrillation who presents to the ED with tachycardia.  Patient with atrial fibrillation upon arrival, otherwise normal vitals.  Patient took her diltiazem just prior to arrival.  Patient has history of paroxysmal atrial fibrillation.  She is on blood thinner.  She is supposed to take 30 mg of diltiazem when her heart rate gets over 100.  We will get basic labs and reevaluate.  Labs overall unremarkable.  No significant anemia, electrolyte abnormality.  Upon recheck patient appears to be in normal sinus rhythm.  EKG confirmed normal sinus rhythm at 80.  Recommend that she continue diltiazem as needed and recommend follow-up with cardiology if she continues to need increasing doses.  Discharged in good  condition.  Understands return precautions.  This chart was dictated using voice recognition software.  Despite best efforts to proofread,  errors can occur which can change the documentation meaning.   Final Clinical Impressions(s) / ED Diagnoses   Final diagnoses:  Tachycardia    ED Discharge Orders    None       Lennice Sites, DO 11/19/18 1004

## 2018-11-19 NOTE — ED Triage Notes (Signed)
Pt reports hx a fib around 545am started feeling heart racing. Pt reports that she took Cardizem that she has PRN but didn't seem to help.

## 2018-12-01 NOTE — Progress Notes (Signed)
Cardiology Office Note   Date:  12/02/2018   ID:  Margaret, Mckenzie 02-17-1951, MRN [id number]  PCP:  [name], DO  Cardiologist:  [facility]   Chief Complaint  Patient presents with  . Palpitations  . Hypertension     History of Present Illness: Margaret Mckenzie is a 68 y.o. female who presents for ongoing assessment and management of paroxysmal atrial fib on diltiazem and Eliquis with a CHADS VASC Score of 3. She is unaware of irregular HR except for rare occasions. Other history includes HTN, BiPolar 1 disorder.   She comes today reporting two occasions when she experienced atrial fibrillation. On episode led her to seek medical treatment in the ER.  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 She took 30 mg of diltiazem and waited about an hour. The rapid HR did not subside. She presented to the ER. In ER, telemetry confirmed rapid atrial fib but follow up EKG did not record this. She states she was in the ER for about 3 hours going in and out of atrial fib. They did not treat her with any additional diltiazem. Labs were normal. She was released and asked to follow up with cardiology.   On New Years Eve when she went to bed, she again felt rapid irregular HR. She took diltiazem 30 mg and went to sleep. She states that when she awoke in the am, she was feeling normal. She has not had any recurrent episodes of this since then.   Past Medical History:  Diagnosis Date  . Bipolar 1 disorder ([place])   . Chicken pox   . Hypertension    high blood pressure readings   . Kidney disease    kidney stones and saw urologist, Dr. Edward Qualia for lithotripsy remotely   . PAF (paroxysmal atrial fibrillation) ([place])     Past Surgical History:  Procedure Laterality Date  . BUNIONECTOMY  2007  . TONSILLECTOMY AND ADENOIDECTOMY  1975     Current Outpatient Medications    Medication Sig Dispense Refill  . ALPRAZolam (XANAX) 0.25 MG tablet Take 0.25 mg by mouth daily as needed for anxiety or sleep.     Marland Kitchen apixaban (ELIQUIS) 5 MG TABS tablet Take 1 tablet (5 mg total) by mouth 2 (two) times daily. 180 tablet 3  . Calcium Carbonate-Vit D-Min (CALCIUM 1200 PO) Take by mouth.    . cholecalciferol (VITAMIN D) 1000 units tablet Take 1,000 Units by mouth daily.    Marland Kitchen diltiazem (CARDIZEM) 30 MG tablet Take 1 tablet every 4 hours AS NEEDED for AFIB heart rate >100 45 tablet 1  . lamoTRIgine (LAMICTAL) 150 MG tablet Take 300 mg by mouth at bedtime.     Marland Kitchen lisinopril-hydrochlorothiazide (PRINZIDE,ZESTORETIC) 10-12.5 MG tablet TAKE ONE TABLET BY MOUTH ONE TIME DAILY  90 tablet 3  . Magnesium 500 MG TABS Take 1 tablet by mouth at bedtime.    . risperiDONE (RISPERDAL) 3 MG tablet Take 3 mg by mouth at bedtime.    . traZODone (DESYREL) 50 MG tablet Take 50 mg by mouth at bedtime.     Current Facility-Administered Medications  Medication Dose Route Frequency Provider Last Rate Last Dose  . 0.9 %  sodium chloride infusion  500 mL Intravenous Once Gatha Mayer, MD        Allergies:   Patient has  no known allergies.    Social History:  The patient  reports that she quit smoking about 16 months ago. Her smoking use included cigarettes. She smoked 0.50 packs per day. She has never used smokeless tobacco. She reports current alcohol use. She reports that she does not use drugs.   Family History:  The patient's family history includes Heart disease (age of onset: 65) in her father; Hypertension in her father; Mental illness in her sister; Mental retardation in her mother; Osteoporosis in her mother; Stroke in her mother; Stroke (age of onset: 42) in her father.    ROS: All other systems are reviewed and negative. Unless otherwise mentioned in H&P    PHYSICAL EXAM: VS:  BP (!) 146/86   Pulse 64   Ht 5\' 4"  (1.626 m)   Wt 141 lb (64 kg)   BMI 24.20 kg/m  , BMI Body mass  index is 24.2 kg/m. GEN: Well nourished, well developed, in no acute distress HEENT: normal Neck: no JVD, carotid bruits, or masses Cardiac: RRR; no murmurs, rubs, or gallops,no edema  Respiratory:  Clear to auscultation bilaterally, normal work of breathing GI: soft, nontender, nondistended, + BS MS: no deformity or atrophy Skin: warm and dry, no rash Neuro:  Strength and sensation are intact Psych: euthymic mood, full affect   EKG:  NSR rate of 64 bpm. Incomplete RBBB.   Recent Labs: 11/19/2018: BUN 11; Creatinine, Ser 0.84; Hemoglobin 13.0; Platelets 282; Potassium 3.9; Sodium 130    Lipid Panel    Component Value Date/Time   CHOL 220 (A) 06/04/2017   TRIG 82 06/04/2017   HDL 112 (A) 06/04/2017   CHOLHDL 2 08/04/2013 1102   VLDL 68.6 (H) 08/04/2013 1102   LDLCALC 92 06/04/2017   LDLDIRECT 58.8 08/04/2013 1102      Wt Readings from Last 3 Encounters:  12/02/18 141 lb (64 kg)  09/10/18 140 lb (63.5 kg)  09/06/18 139 lb 12.8 oz (63.4 kg)      Other studies Reviewed:   Echocardiogram 06/26/2017 Left ventricle: The cavity size was normal. Wall thickness was   increased in a pattern of moderate LVH. Systolic function was   normal. The estimated ejection fraction was in the range of 55%   to 60%. Wall motion was normal; there were no regional wall   motion abnormalities. Doppler parameters are consistent with   abnormal left ventricular relaxation (grade 1 diastolic   dysfunction). - Mitral valve: Calcified annulus.  Impressions:  - Normal LV systolic function; moderate LVH; mild diastolic   dysfunction; trace MR and TR.   ASSESSMENT AND PLAN:  1.  PAF: She reports two episodes of rapid irregular HR that required her to take 30 mg of diltiazem. Once she was seen in the ER.   Due to separation of time between these events, I will not change her to daily dose of diltiazem at this time, instead, she is to continue to use diltiazem prn. However, if she has daily  occurrence of atrial fib with rapid rate she is to call us. At that time will consider adding daily dose of diltiazem. For now watch and wait.  I have explained this to her. She verbalizes understanding. She is to continue to take Eliquis as directed.   2. Tobacco abuse: She has quit smoking.   Current medicines are reviewed at length with the patient today.    Labs/ tests ordered today include: None    Phill Myron. West Pugh, ANP, AACC  12/02/2018 2:41 PM    [address] Office [phone] Fax [phone]

## 2018-12-02 ENCOUNTER — Ambulatory Visit (INDEPENDENT_AMBULATORY_CARE_PROVIDER_SITE_OTHER): Payer: PPO | Admitting: Adult Health

## 2018-12-02 ENCOUNTER — Encounter: Payer: Self-pay | Admitting: Adult Health

## 2018-12-02 VITALS — BP 146/86 | HR 64 | Ht 64.0 in | Wt 141.0 lb

## 2018-12-02 DIAGNOSIS — I480 Paroxysmal atrial fibrillation: Secondary | ICD-10-CM

## 2018-12-02 NOTE — Patient Instructions (Signed)
MAKE SURE THAT YOU CALL AND SCHEDULE A SOONER APPT IF AFIB IS HAPPENING MORE FREQUENTLY.  Follow-Up: You will need a follow up appointment in 9 months.  Please call our office 2 months in advance (AUGUST 2020)  to schedule this appointment (OCT 2020).  You may see Minus Breeding, MD Jory Sims, DNP, [place]  or one of the following Advanced Practice Providers on your designated Care Team:      Jory Sims, DNP, AACC  Rosaria Ferries, PA-C   Medication Instructions:  NO CHANGES- Your physician recommends that you continue on your current medications as directed. Please refer to the Current Medication list given to you today. If you need a refill on your cardiac medications before your next appointment, please call your pharmacy.  Labwork: When you have labs (blood work) and your tests are completely normal, you will receive your results ONLY by [place] (if you have MyChart) -OR- A paper copy in the mail.  At [facility], you and your health needs are our priority.  As part of our continuing mission to provide you with exceptional heart care, we have created designated Provider Care Teams.  These Care Teams include your primary Cardiologist (physician) and Advanced Practice Providers (APPs -  Physician Assistants and Nurse Practitioners) who all work together to provide you with the care you need, when you need it.  Thank you for choosing [facility] at [facility]!!

## 2018-12-11 ENCOUNTER — Telehealth: Payer: Self-pay | Admitting: Physician Assistant

## 2018-12-11 NOTE — Telephone Encounter (Signed)
Need paper chart to verify medication, not seen in epic

## 2018-12-11 NOTE — Telephone Encounter (Signed)
Pt called for refill of trazadone 50 mg 1 q hs #60 at [facility].  appt 01/10/19

## 2018-12-12 NOTE — Telephone Encounter (Signed)
Called into her pharmacy  

## 2019-01-10 ENCOUNTER — Ambulatory Visit (INDEPENDENT_AMBULATORY_CARE_PROVIDER_SITE_OTHER): Payer: PPO | Admitting: Physician Assistant

## 2019-01-10 ENCOUNTER — Encounter: Payer: Self-pay | Admitting: Physician Assistant

## 2019-01-10 DIAGNOSIS — F319 Bipolar disorder, unspecified: Secondary | ICD-10-CM

## 2019-01-10 DIAGNOSIS — Z79899 Other long term (current) drug therapy: Secondary | ICD-10-CM

## 2019-01-10 DIAGNOSIS — G4700 Insomnia, unspecified: Secondary | ICD-10-CM

## 2019-01-10 MED ORDER — ALPRAZOLAM 0.25 MG PO TABS: 0.2500 mg | ORAL_TABLET | Freq: Two times a day (BID) | ORAL | 5 refills | Status: DC | PRN

## 2019-01-10 MED ORDER — TRAZODONE HCL 50 MG PO TABS: 50.0000 mg | ORAL_TABLET | Freq: Every evening | ORAL | 8 refills | Status: DC | PRN

## 2019-01-10 MED ORDER — LAMOTRIGINE 150 MG PO TABS: 300.0000 mg | ORAL_TABLET | Freq: Every day | ORAL | 8 refills | Status: DC

## 2019-01-10 MED ORDER — RISPERIDONE 3 MG PO TABS: 3.0000 mg | ORAL_TABLET | Freq: Every day | ORAL | 8 refills | Status: DC

## 2019-01-10 NOTE — Progress Notes (Signed)
[facility] Med Check  Patient ID: Margaret Mckenzie,  MRN: [id number]  PCP: [name], DO  Date of Evaluation: 01/10/2019 Time spent:15 minutes  Chief Complaint:  Chief Complaint    Follow-up      HISTORY/CURRENT STATUS: HPI here for routine med check.  States she is doing great!  "My meds are working well.  I feel like a normal person."  She continues to work at the [employer] and enjoys working there.  She sleeps well.  The trazodone is really helped with that.  She does not feel any hangover the next morning.  Anxiety is well controlled.  Patient denies loss of interest in usual activities and is able to enjoy things.  Denies decreased energy or motivation.  Appetite has not changed.  No extreme sadness, tearfulness, or feelings of hopelessness.  Denies any changes in concentration, making decisions or remembering things.  Denies suicidal or homicidal thoughts.  Patient denies increased energy with decreased need for sleep, no increased talkativeness, no racing thoughts, no impulsivity or risky behaviors, no increased spending, no increased libido, no grandiosity.  Denies muscle or joint pain, stiffness, or dystonia.  Denies dizziness, syncope, seizures, numbness, tingling, tremor, tics, unsteady gait, slurred speech, confusion.   Individual Medical History/ Review of Systems: Changes? :Yes  She had to go to the emergency room on Christmas eve because of an atrial fibrillation flare.  They were able to get it under control and she was allowed to go home.  She has been feeling well since then and has had no further problems.  Past medications for mental health diagnoses include: Depakote, Lamictal, Prozac, Lexapro, questionable Ambien, Xanax, Sonata, Risperdal  Allergies: Patient has no known allergies.  Current Medications:  Current Outpatient Medications:  .  ALPRAZolam (XANAX) 0.25 MG tablet, Take 1 tablet (0.25 mg total) by mouth 2 (two) times daily as  needed for anxiety or sleep., Disp: 60 tablet, Rfl: 5 .  apixaban (ELIQUIS) 5 MG TABS tablet, Take 1 tablet (5 mg total) by mouth 2 (two) times daily., Disp: 180 tablet, Rfl: 3 .  Calcium Carbonate-Vit D-Min (CALCIUM 1200 PO), Take by mouth., Disp: , Rfl:  .  cholecalciferol (VITAMIN D) 1000 units tablet, Take 1,000 Units by mouth daily., Disp: , Rfl:  .  diltiazem (CARDIZEM) 30 MG tablet, Take 1 tablet every 4 hours AS NEEDED for AFIB heart rate >100, Disp: 45 tablet, Rfl: 1 .  lamoTRIgine (LAMICTAL) 150 MG tablet, Take 2 tablets (300 mg total) by mouth at bedtime., Disp: 60 tablet, Rfl: 8 .  lisinopril-hydrochlorothiazide (PRINZIDE,ZESTORETIC) 10-12.5 MG tablet, TAKE ONE TABLET BY MOUTH ONE TIME DAILY , Disp: 90 tablet, Rfl: 3 .  Magnesium 500 MG TABS, Take 1 tablet by mouth at bedtime., Disp: , Rfl:  .  risperiDONE (RISPERDAL) 3 MG tablet, Take 1 tablet (3 mg total) by mouth at bedtime., Disp: 30 tablet, Rfl: 8 .  traZODone (DESYREL) 50 MG tablet, Take 1-2 tablets (50-100 mg total) by mouth at bedtime as needed for sleep., Disp: 60 tablet, Rfl: 8  Current Facility-Administered Medications:  .  0.9 %  sodium chloride infusion, 500 mL, Intravenous, Once, Carlean Purl Ofilia Neas, MD Medication Side Effects: none  Family Medical/ Social History: Changes? No  MENTAL HEALTH EXAM:  There were no vitals taken for this visit.There is no height or weight on file to calculate BMI.  General Appearance: Casual and Well Groomed  Eye Contact:  Good  Speech:  Clear and Coherent  Volume:  Normal  Mood:  Euthymic  Affect:  Appropriate  Thought Process:  Goal Directed  Orientation:  Full (Time, Place, and Person)  Thought Content: Logical   Suicidal Thoughts:  No  Homicidal Thoughts:  No  Memory:  WNL  Judgement:  Good  Insight:  Good  Psychomotor Activity:  Normal  Concentration:  Concentration: Good  Recall:  Good  Fund of Knowledge: Good  Language: Good  Assets:  Desire for Improvement  ADL's:   Intact  Cognition: WNL  Prognosis:  Good  Labs 08/26/2018 show glucose of 87, hemoglobin A1c is 5.2 lipid panel completely normal except total cholesterol 202  DIAGNOSES:    ICD-10-CM   1. Bipolar I disorder ([place]) F31.9   2. Insomnia, unspecified type G47.00   3. Encounter for long-term (current) use of medications Z79.899 Hemoglobin A1c    Lipid panel    Basic metabolic panel    Receiving Psychotherapy: No    RECOMMENDATIONS: Continue Xanax 0.25 mg twice daily as needed. Continue Lamictal 300 mg nightly. Continue Risperdal 3 mg nightly. Continue trazodone 50 mg 1-2 nightly as needed. I have given her a lab order for BMP, hemoglobin A1c, and fasting lipid profile to be done sometime in October before our appointment. Return in 9 months.  Donnal Moat, PA-C

## 2019-02-13 ENCOUNTER — Telehealth: Payer: Self-pay | Admitting: Physician Assistant

## 2019-02-13 NOTE — Telephone Encounter (Signed)
Last fill 01/10/2019 Has 5 refills left okay to transfer those?

## 2019-02-13 NOTE — Telephone Encounter (Signed)
Patient in the process of having all meds transferred to [facility] on [address] at the corner of [address] patient stated script for the xanax was not transferred please move the xanax to the [facility] on [address]

## 2019-02-14 ENCOUNTER — Other Ambulatory Visit: Payer: Self-pay

## 2019-02-14 MED ORDER — ALPRAZOLAM 0.25 MG PO TABS: 0.2500 mg | ORAL_TABLET | Freq: Two times a day (BID) | ORAL | 5 refills | Status: DC | PRN

## 2019-02-14 NOTE — Telephone Encounter (Signed)
Yes. Thanks 

## 2019-02-14 NOTE — Progress Notes (Signed)
Pt asking to transfer her Alprazolam to [place] from [place], provider agreed and rx called into [facility] per request. Rx's at [facility] canceled.

## 2019-02-14 NOTE — Telephone Encounter (Signed)
rx's at [facility] canceled and called into [place]

## 2019-03-30 ENCOUNTER — Other Ambulatory Visit: Payer: Self-pay | Admitting: Physician Assistant

## 2019-07-22 ENCOUNTER — Telehealth: Payer: PPO | Admitting: Family Medicine

## 2019-07-24 ENCOUNTER — Telehealth: Payer: PPO | Admitting: Family Medicine

## 2019-07-28 ENCOUNTER — Other Ambulatory Visit: Payer: Self-pay

## 2019-07-28 ENCOUNTER — Other Ambulatory Visit: Payer: Self-pay | Admitting: Pharmacist

## 2019-07-28 MED ORDER — FOSINOPRIL SODIUM-HCTZ 10-12.5 MG PO TABS: 1.0000 | ORAL_TABLET | Freq: Every day | ORAL | 1 refills | Status: DC

## 2019-07-28 NOTE — Telephone Encounter (Signed)
Refill

## 2019-07-29 MED ORDER — LISINOPRIL-HYDROCHLOROTHIAZIDE 20-25 MG PO TABS: 0.5000 | ORAL_TABLET | Freq: Every day | ORAL | 1 refills | Status: DC

## 2019-08-03 ENCOUNTER — Encounter: Payer: Self-pay | Admitting: Family Medicine

## 2019-08-05 ENCOUNTER — Encounter: Payer: Self-pay | Admitting: Family Medicine

## 2019-08-05 ENCOUNTER — Other Ambulatory Visit: Payer: Self-pay

## 2019-08-05 ENCOUNTER — Telehealth (INDEPENDENT_AMBULATORY_CARE_PROVIDER_SITE_OTHER): Payer: PPO | Admitting: Family Medicine

## 2019-08-05 ENCOUNTER — Telehealth: Payer: Self-pay | Admitting: *Deleted

## 2019-08-05 VITALS — Temp 97.4°F

## 2019-08-05 DIAGNOSIS — R21 Rash and other nonspecific skin eruption: Secondary | ICD-10-CM | POA: Diagnosis not present

## 2019-08-05 MED ORDER — TRIAMCINOLONE ACETONIDE 0.5 % EX OINT: 1.0000 "application " | TOPICAL_OINTMENT | Freq: Two times a day (BID) | CUTANEOUS | 0 refills | Status: DC

## 2019-08-05 MED ORDER — CEPHALEXIN 500 MG PO CAPS: 500.0000 mg | ORAL_CAPSULE | Freq: Two times a day (BID) | ORAL | 0 refills | Status: DC

## 2019-08-05 NOTE — Patient Instructions (Signed)
-  I sent the medication(s) we discussed to your pharmacy:  Meds ordered this encounter  Medications  . cephALEXin (KEFLEX) 500 MG capsule    Sig: Take 1 capsule (500 mg total) by mouth 2 (two) times daily.    Dispense:  10 capsule    Refill:  0  . triamcinolone ointment (KENALOG) 0.5 %    Sig: Apply 1 application topically 2 (two) times daily.    Dispense:  30 g    Refill:  0   Also use Aquaphor twice daily and after each handwashing  Use hypoallergenic gentle soap only and avoid excessive tie in water, dirt, etc  Avoid latex gloves  Protect hands when cleaning or in water  Please let us know if you have any questions or concerns regarding this prescription.  Follow up in 1 - 2 weeks. Call our office for an appointment if the front desk does not contact you in the next 2 days.  I hope you are feeling better soon! Seek care promptly if your symptoms worsen, new concerns arise or you are not improving with treatment.

## 2019-08-05 NOTE — Telephone Encounter (Signed)
Copied from [place] [id number]. Topic: Appointment Scheduling - Scheduling Inquiry for Clinic >> Aug 05, 2019  2:30 PM Scherrie Gerlach wrote: Reason for CRM: pt called to schedule the virtual with Dr Maudie Mercury this afternoon. Just checking if anything you need to do.  Thank you joanne!

## 2019-08-05 NOTE — Telephone Encounter (Signed)
Appt was scheduled by the [facility].

## 2019-08-05 NOTE — Progress Notes (Signed)
Virtual Visit via Video Note  I connected with Margaret Mckenzie  on 08/05/19 at  3:20 PM EDT by a video enabled telemedicine application and verified that I am speaking with the correct person using two identifiers.  Location patient: home Location provider:work or home office Persons participating in the virtual visit: patient, provider  I discussed the limitations of evaluation and management by telemedicine and the availability of in person appointments. The patient expressed understanding and agreed to proceed.   HPI:  Acute visit for rash on the hands: -she has been using a lot of handwashing and alcohol sanitizer -she had some dry skin, but has gotten a lot worse the last few weeks -itchy, peeling, weeping at times -using vinyl gloves -no new exposures that she is aware of -the rash is only on the hands -no purulent discharge, malaise, fevers, rash elsewhere -she has tried O'keefes   ROS: See pertinent positives and negatives per HPI.  Past Medical History:  Diagnosis Date  . Bipolar 1 disorder ([place])   . Chicken pox   . Hypertension    high blood pressure readings   . Kidney disease    kidney stones and saw urologist, Dr. Edward Qualia for lithotripsy remotely   . PAF (paroxysmal atrial fibrillation) ([place])     Past Surgical History:  Procedure Laterality Date  . BUNIONECTOMY  2007  . TONSILLECTOMY AND ADENOIDECTOMY  1975    Family History  Problem Relation Age of Onset  . Osteoporosis Mother   . Stroke Mother        TIA, [place]  . Mental retardation Mother   . Hypertension Father   . Heart disease Father 91  . Stroke Father 73  . Mental illness Sister   . Breast cancer Neg Hx     SOCIAL HX: see hpi   Current Outpatient Medications:  .  ALPRAZolam (XANAX) 0.25 MG tablet, Take 1 tablet (0.25 mg total) by mouth 2 (two) times daily as needed for anxiety or sleep., Disp: 60 tablet, Rfl: 5 .  apixaban (ELIQUIS) 5 MG TABS tablet, Take 1 tablet (5 mg total) by mouth 2 (two)  times daily., Disp: 180 tablet, Rfl: 3 .  Calcium Carbonate-Vit D-Min (CALCIUM 1200 PO), Take by mouth., Disp: , Rfl:  .  cholecalciferol (VITAMIN D) 1000 units tablet, Take 1,000 Units by mouth daily., Disp: , Rfl:  .  diltiazem (CARDIZEM) 30 MG tablet, Take 1 tablet every 4 hours AS NEEDED for AFIB heart rate >100, Disp: 45 tablet, Rfl: 1 .  lamoTRIgine (LAMICTAL) 150 MG tablet, Take 2 tablets (300 mg total) by mouth at bedtime., Disp: 60 tablet, Rfl: 8 .  lisinopril-hydrochlorothiazide (ZESTORETIC) 20-25 MG tablet, Take 0.5 tablets by mouth daily., Disp: 45 tablet, Rfl: 1 .  Magnesium 500 MG TABS, Take 1 tablet by mouth at bedtime., Disp: , Rfl:  .  risperiDONE (RISPERDAL) 3 MG tablet, TAKE 1 TABLET BY MOUTH EVERY NIGHT AT BEDTIME, Disp: 30 tablet, Rfl: 8 .  traZODone (DESYREL) 50 MG tablet, Take 1-2 tablets (50-100 mg total) by mouth at bedtime as needed for sleep., Disp: 60 tablet, Rfl: 8 .  cephALEXin (KEFLEX) 500 MG capsule, Take 1 capsule (500 mg total) by mouth 2 (two) times daily., Disp: 10 capsule, Rfl: 0 .  triamcinolone ointment (KENALOG) 0.5 %, Apply 1 application topically 2 (two) times daily., Disp: 30 g, Rfl: 0  Current Facility-Administered Medications:  .  0.9 %  sodium chloride infusion, 500 mL, Intravenous, Once, Carlean Purl Ofilia Neas, MD  EXAM:  VITALS per patient if applicable:  GENERAL: alert, oriented, appears well and in no acute distress  HEENT: atraumatic, conjunttiva clear, no obvious abnormalities on inspection of external nose and ears  NECK: normal movements of the head and neck  LUNGS: on inspection no signs of respiratory distress, breathing rate appears normal, no obvious gross SOB, gasping or wheezing  CV: no obvious cyanosis  MS: moves all visible extremities without noticeable abnormality  SKIN: patches of erythema and pealing skin with ? Mild edema in some locations on both hands  PSYCH/NEURO: pleasant and cooperative, no obvious depression or  anxiety, speech and thought processing grossly intact  ASSESSMENT AND PLAN:  Discussed the following assessment and plan:  Rash  -we discussed possible serious and likely etiologies, options for evaluation and workup, limitations of telemedicine visit vs in person visit, treatment, treatment risks and precautions. Pt prefers to treat via telemedicine empirically rather then risking or undertaking an in person visit at this moment. Patient agrees to seek prompt in person care if worsening, new symptoms arise, or if is not improving with treatment. -query hand eczema and she wants to try treatment with the following with close follow up: a good emollient (Aquaphor bid and after handwashing), gentle hypoallergenic soap, topical steroid - triam oint sent to pharmacy, keflex 500 bid x 5 days for possible 2ndary infection R hand.  -advised follow up in 1 week, needs new PCP as well in office - sent message to admin staffe   I discussed the assessment and treatment plan with the patient. The patient was provided an opportunity to ask questions and all were answered. The patient agreed with the plan and demonstrated an understanding of the instructions.   The patient was advised to call back or seek an in-person evaluation if the symptoms worsen or if the condition fails to improve as anticipated.   [name], DO   Patient Instructions   -I sent the medication(s) we discussed to your pharmacy:  Meds ordered this encounter  Medications  . cephALEXin (KEFLEX) 500 MG capsule    Sig: Take 1 capsule (500 mg total) by mouth 2 (two) times daily.    Dispense:  10 capsule    Refill:  0  . triamcinolone ointment (KENALOG) 0.5 %    Sig: Apply 1 application topically 2 (two) times daily.    Dispense:  30 g    Refill:  0   Also use Aquaphor twice daily and after each handwashing  Use hypoallergenic gentle soap only and avoid excessive tie in water, dirt, etc  Avoid latex gloves  Protect hands  when cleaning or in water  Please let us know if you have any questions or concerns regarding this prescription.  Follow up in 1 - 2 weeks. Call our office for an appointment if the front desk does not contact you in the next 2 days.  I hope you are feeling better soon! Seek care promptly if your symptoms worsen, new concerns arise or you are not improving with treatment.

## 2019-08-12 ENCOUNTER — Other Ambulatory Visit: Payer: Self-pay

## 2019-08-12 ENCOUNTER — Encounter: Payer: Self-pay | Admitting: Family Medicine

## 2019-08-12 ENCOUNTER — Telehealth (INDEPENDENT_AMBULATORY_CARE_PROVIDER_SITE_OTHER): Payer: PPO | Admitting: Family Medicine

## 2019-08-12 VITALS — Temp 98.0°F | Wt 140.0 lb

## 2019-08-12 DIAGNOSIS — L309 Dermatitis, unspecified: Secondary | ICD-10-CM

## 2019-08-12 NOTE — Progress Notes (Signed)
Virtual Visit via Video Note  I connected with Margaret Mckenzie  on 08/12/19 at  3:20 PM EDT by a video enabled telemedicine application and verified that I am speaking with the correct person using two identifiers.  Location patient: home Location provider:work or home office Persons participating in the virtual visit: patient, provider  I discussed the limitations of evaluation and management by telemedicine and the availability of in person appointments. The patient expressed understanding and agreed to proceed.   HPI:  Follow up Rsh on hands: -presumed eczema w/ possible secondary bacterial infection treated with keflex, topical steroid, emollient and avoidance irritants -today reports doing "great" with resolution of rash -she is using the triam oint still, the Aquaphor, Aveeno soap at work and home and is following all instructions -no pain, itchy or rash remains  ROS: See pertinent positives and negatives per HPI.  Past Medical History:  Diagnosis Date  . Bipolar 1 disorder ([place])   . Chicken pox   . Hypertension    high blood pressure readings   . Kidney disease    kidney stones and saw urologist, Dr. Edward Qualia for lithotripsy remotely   . PAF (paroxysmal atrial fibrillation) ([place])     Past Surgical History:  Procedure Laterality Date  . BUNIONECTOMY  2007  . TONSILLECTOMY AND ADENOIDECTOMY  1975    Family History  Problem Relation Age of Onset  . Osteoporosis Mother   . Stroke Mother        TIA, [place]  . Mental retardation Mother   . Hypertension Father   . Heart disease Father 36  . Stroke Father 19  . Mental illness Sister   . Breast cancer Neg Hx     SOCIAL HX: see hpi   Current Outpatient Medications:  .  ALPRAZolam (XANAX) 0.25 MG tablet, Take 1 tablet (0.25 mg total) by mouth 2 (two) times daily as needed for anxiety or sleep., Disp: 60 tablet, Rfl: 5 .  apixaban (ELIQUIS) 5 MG TABS tablet, Take 1 tablet (5 mg total) by mouth 2 (two) times daily., Disp: 180  tablet, Rfl: 3 .  Ascorbic Acid (VITAMIN C) 1000 MG tablet, Take 1,000 mg by mouth daily., Disp: , Rfl:  .  Calcium Carbonate-Vit D-Min (CALCIUM 1200 PO), Take by mouth., Disp: , Rfl:  .  cholecalciferol (VITAMIN D) 1000 units tablet, Take 1,000 Units by mouth daily., Disp: , Rfl:  .  diltiazem (CARDIZEM) 30 MG tablet, Take 1 tablet every 4 hours AS NEEDED for AFIB heart rate >100, Disp: 45 tablet, Rfl: 1 .  lamoTRIgine (LAMICTAL) 150 MG tablet, Take 2 tablets (300 mg total) by mouth at bedtime., Disp: 60 tablet, Rfl: 8 .  lisinopril-hydrochlorothiazide (ZESTORETIC) 20-25 MG tablet, Take 0.5 tablets by mouth daily., Disp: 45 tablet, Rfl: 1 .  Magnesium 500 MG TABS, Take 1 tablet by mouth at bedtime., Disp: , Rfl:  .  risperiDONE (RISPERDAL) 3 MG tablet, TAKE 1 TABLET BY MOUTH EVERY NIGHT AT BEDTIME, Disp: 30 tablet, Rfl: 8 .  traZODone (DESYREL) 50 MG tablet, Take 1-2 tablets (50-100 mg total) by mouth at bedtime as needed for sleep., Disp: 60 tablet, Rfl: 8 .  triamcinolone ointment (KENALOG) 0.5 %, Apply 1 application topically 2 (two) times daily., Disp: 30 g, Rfl: 0  Current Facility-Administered Medications:  .  0.9 %  sodium chloride infusion, 500 mL, Intravenous, Once, Carlean Purl Ofilia Neas, MD  EXAM:  VITALS per patient if applicable:none  GENERAL: alert, oriented, appears well and in no acute distress  HEENT: atraumatic, conjunttiva clear, no obvious abnormalities on inspection of external nose and ears  NECK: normal movements of the head and neck  LUNGS: on inspection no signs of respiratory distress, breathing rate appears normal, no obvious gross SOB, gasping or wheezing  CV: no obvious cyanosis  SKIN: on video exam of hands, remarkable resolution of rash with very mild hyperpigmentation of skin where lesions were present at last visit, o/w normal  MS: moves all visible extremities without noticeable abnormality  PSYCH/NEURO: pleasant and cooperative, no obvious depression or  anxiety, speech and thought processing grossly intact  ASSESSMENT AND PLAN:  Discussed the following assessment and plan:  Hand eczema  -so pleased with the resolution of symptoms, she is grateful -discussed prevention of recurrence with hypoallergenic soaps, gentle skin care and continuation of good emollient after hand washing and at least twice daily -advised can stop the steroid and use again if recurs -follow up as needed for this is recurs  I discussed the assessment and treatment plan with the patient. The patient was provided an opportunity to ask questions and all were answered. The patient agreed with the plan and demonstrated an understanding of the instructions.   The patient was advised to call back or seek an in-person evaluation if the symptoms worsen or if the condition fails to improve as anticipated.   Margaret Kern, DO

## 2019-08-28 NOTE — Progress Notes (Signed)
Cardiology Office Note   Date:  08/29/2019   ID:  Margaret, Mckenzie 02/15/1951, MRN [id number]  PCP:  Margaret Kern, DO  Cardiologist:   Minus Breeding, MD    Chief Complaint  Patient presents with  . Atrial Fibrillation      History of Present Illness: Margaret Mckenzie is a 68 y.o. female who presents for evaluation of atrial fib.  She was noted to be in this in her PCP office in July of 2018.  She as been seen in the Afib Clinic.  She did have an echocardiogram and this demonstrated some LVH and was otherwise unremarkable.  She was in the ED in Dec for PAF.    The patient has had a few episodes of tachypalpitations since she was in the emergency room in December probably taking about 3 Cardizem last 10 months.  The episode in December was lasting a couple of hours which is unusual.  Is otherwise been okay.  She is anxious today which is why she thinks her blood pressure is elevated.  She denies any presyncope or syncope.  She has had no chest pressure, neck or arm discomfort.  He has had no weight gain or edema.  He remains active walking.   Past Medical History:  Diagnosis Date  . Bipolar 1 disorder ([place])   . Chicken pox   . Hypertension    high blood pressure readings   . Kidney disease    kidney stones and saw urologist, Dr. Edward Mckenzie for lithotripsy remotely   . PAF (paroxysmal atrial fibrillation) ([place])     Past Surgical History:  Procedure Laterality Date  . BUNIONECTOMY  2007  . TONSILLECTOMY AND ADENOIDECTOMY  1975     Current Outpatient Medications  Medication Sig Dispense Refill  . ALPRAZolam (XANAX) 0.25 MG tablet Take 1 tablet (0.25 mg total) by mouth 2 (two) times daily as needed for anxiety or sleep. 60 tablet 5  . apixaban (ELIQUIS) 5 MG TABS tablet Take 1 tablet (5 mg total) by mouth 2 (two) times daily. 180 tablet 3  . Ascorbic Acid (VITAMIN C) 1000 MG tablet Take 1,000 mg by mouth daily.    . Calcium Carbonate-Vit D-Min (CALCIUM 1200  PO) Take by mouth.    . cholecalciferol (VITAMIN D) 1000 units tablet Take 1,000 Units by mouth daily.    Marland Kitchen diltiazem (CARDIZEM) 30 MG tablet Take 1 tablet every 4 hours AS NEEDED for AFIB heart rate >100 45 tablet 1  . lamoTRIgine (LAMICTAL) 150 MG tablet Take 2 tablets (300 mg total) by mouth at bedtime. 60 tablet 8  . lisinopril-hydrochlorothiazide (ZESTORETIC) 20-25 MG tablet Take 1 tablet by mouth daily. 90 tablet 3  . Magnesium 500 MG TABS Take 1 tablet by mouth at bedtime.    . risperiDONE (RISPERDAL) 3 MG tablet TAKE 1 TABLET BY MOUTH EVERY NIGHT AT BEDTIME 30 tablet 8  . traZODone (DESYREL) 50 MG tablet Take 1-2 tablets (50-100 mg total) by mouth at bedtime as needed for sleep. 60 tablet 8  . triamcinolone ointment (KENALOG) 0.5 % Apply 1 application topically 2 (two) times daily. 30 g 0   Current Facility-Administered Medications  Medication Dose Route Frequency Provider Last Rate Last Dose  . 0.9 %  sodium chloride infusion  500 mL Intravenous Once Gatha Mayer, MD        Allergies:   Patient has no known allergies.    ROS:  Please see the history of present illness.  Otherwise, review of systems are positive for none.   All other systems are reviewed and negative.    PHYSICAL EXAM: VS:  BP (!) 156/87   Pulse 68   Temp (!) 96.9 F (36.1 C)   Ht 5\' 4"  (1.626 m)   Wt 138 lb (62.6 kg)   SpO2 98%   BMI 23.69 kg/m  , BMI Body mass index is 23.69 kg/m. GENERAL:  Well appearing NECK:  No jugular venous distention, waveform within normal limits, carotid upstroke brisk and symmetric, no bruits, no thyromegaly LUNGS:  Clear to auscultation bilaterally CHEST:  Unremarkable HEART:  PMI not displaced or sustained,S1 and S2 within normal limits, no S3, no S4, no clicks, no rubs, no murmurs ABD:  Flat, positive bowel sounds normal in frequency in pitch, no bruits, no rebound, no guarding, no midline pulsatile mass, no hepatomegaly, no splenomegaly EXT:  2 plus pulses throughout,  no edema, no cyanosis no clubbing   EKG:  EKG is not ordered today.   Recent Labs: 11/19/2018: BUN 11; Creatinine, Ser 0.84; Hemoglobin 13.0; Platelets 282; Potassium 3.9; Sodium 130    Lipid Panel    Component Value Date/Time   CHOL 220 (A) 06/04/2017   TRIG 82 06/04/2017   HDL 112 (A) 06/04/2017   CHOLHDL 2 08/04/2013 1102   VLDL 68.6 (H) 08/04/2013 1102   LDLCALC 92 06/04/2017   LDLDIRECT 58.8 08/04/2013 1102      Wt Readings from Last 3 Encounters:  08/29/19 138 lb (62.6 kg)  08/12/19 140 lb (63.5 kg)  12/02/18 141 lb (64 kg)      Other studies Reviewed: Additional studies/ records that were reviewed today include:  ED records Review of the above records demonstrates:  See above   ASSESSMENT AND PLAN:   ATRIAL FIB.  [name] has a CHA2DS2 - VASc score of 3.  She says she is not particularly symptomatic with her atrial fibrillation and so she prefers not to change therapy at this point.  She will continue with Cardizem pill in pocket.  HTN:  The blood pressure is now at target.  Again and increase the lisinopril HCT to 1 full pill daily.   Current medicines are reviewed at length with the patient today.  The patient does not have concerns regarding medicines.  The following changes have been made:  As above  Labs/ tests ordered today include:   No orders of the defined types were placed in this encounter.    Disposition:   FU with 12 months.    Signed, Minus Breeding, MD  08/29/2019 3:19 PM    [facility]

## 2019-08-29 ENCOUNTER — Other Ambulatory Visit: Payer: Self-pay

## 2019-08-29 ENCOUNTER — Encounter: Payer: Self-pay | Admitting: Cardiology

## 2019-08-29 ENCOUNTER — Ambulatory Visit: Payer: PPO | Admitting: Cardiology

## 2019-08-29 VITALS — BP 156/87 | HR 68 | Temp 96.9°F | Ht 64.0 in | Wt 138.0 lb

## 2019-08-29 DIAGNOSIS — I10 Essential (primary) hypertension: Secondary | ICD-10-CM | POA: Diagnosis not present

## 2019-08-29 DIAGNOSIS — I480 Paroxysmal atrial fibrillation: Secondary | ICD-10-CM

## 2019-08-29 MED ORDER — APIXABAN 5 MG PO TABS: 5.0000 mg | ORAL_TABLET | Freq: Two times a day (BID) | ORAL | 3 refills | Status: DC

## 2019-08-29 MED ORDER — LISINOPRIL-HYDROCHLOROTHIAZIDE 20-25 MG PO TABS: 1.0000 | ORAL_TABLET | Freq: Every day | ORAL | 3 refills | Status: DC

## 2019-08-29 NOTE — Patient Instructions (Signed)
Medication Instructions:  Take 1 whole tablet of Lisinopril.  If you need a refill on your cardiac medications before your next appointment, please call your pharmacy.   Lab work: BMET and Primary Care Providers labs in one week. If you have labs (blood work) drawn today and your tests are completely normal, you will receive your results only by: [place] (if you have MyChart) OR A paper copy in the mail If you have any lab test that is abnormal or we need to change your treatment, we will call you to review the results.  Testing/Procedures: NONE  Follow-Up: At [facility], you and your health needs are our priority.  As part of our continuing mission to provide you with exceptional heart care, we have created designated Provider Care Teams.  These Care Teams include your primary Cardiologist (physician) and Advanced Practice Providers (APPs -  Physician Assistants and Nurse Practitioners) who all work together to provide you with the care you need, when you need it. You will need a follow up appointment in 12 months.  Please call our office 2 months in advance to schedule this appointment.  You may see Minus Breeding, MD or one of the following Advanced Practice Providers on your designated Care Team:   Rosaria Ferries, PA-C Jory Sims, DNP, ANP  Any Other Special Instructions Will Be Listed Below (If Applicable). Come back in one week and get labs drawn.

## 2019-09-08 ENCOUNTER — Telehealth: Payer: Self-pay | Admitting: Physician Assistant

## 2019-09-08 ENCOUNTER — Other Ambulatory Visit: Payer: Self-pay

## 2019-09-08 DIAGNOSIS — Z79899 Other long term (current) drug therapy: Secondary | ICD-10-CM | POA: Diagnosis not present

## 2019-09-08 LAB — LIPID PANEL
Chol/HDL Ratio: 1.6 ratio (ref 0.0–4.4)
Cholesterol, Total: 226 mg/dL — ABNORMAL HIGH (ref 100–199)
HDL: 145 mg/dL (ref >39)
LDL Chol Calc (NIH): 71 mg/dL (ref 0–99)
Triglycerides: 58 mg/dL (ref 0–149)
VLDL Cholesterol Cal: 10 mg/dL (ref 5–40)

## 2019-09-08 LAB — BASIC METABOLIC PANEL
BUN/Creatinine Ratio: 16 (ref 12–28)
BUN: 12 mg/dL (ref 8–27)
CO2: 27 mmol/L (ref 20–29)
Calcium: 9.6 mg/dL (ref 8.7–10.3)
Chloride: 95 mmol/L — ABNORMAL LOW (ref 96–106)
Creatinine, Ser: 0.73 mg/dL (ref 0.57–1.00)
GFR calc Af Amer: 99 mL/min/{1.73_m2} (ref >59)
GFR calc non Af Amer: 85 mL/min/{1.73_m2} (ref >59)
Glucose: 80 mg/dL (ref 65–99)
Potassium: 5.1 mmol/L (ref 3.5–5.2)
Sodium: 136 mmol/L (ref 134–144)

## 2019-09-08 LAB — HEMOGLOBIN A1C
Est. average glucose Bld gHb Est-mCnc: 100 mg/dL
Hgb A1c MFr Bld: 5.1 % (ref 4.8–5.6)

## 2019-09-08 NOTE — Telephone Encounter (Signed)
Pt called need new Rx for Xanax sent to [facility] on [address]. Don't use [facility] anymore. Need 30 day only appt in Nov.

## 2019-09-08 NOTE — Telephone Encounter (Signed)
Refill pended for approval, last refill 07/15/2019

## 2019-09-09 MED ORDER — ALPRAZOLAM 0.25 MG PO TABS: 0.2500 mg | ORAL_TABLET | Freq: Two times a day (BID) | ORAL | 5 refills | Status: DC | PRN

## 2019-09-09 NOTE — Progress Notes (Signed)
Pt. Made aware and verbalized understanding.

## 2019-09-09 NOTE — Progress Notes (Signed)
Please let her know the total chol is a little high but the rest of the lipid panel is great.  HgbA1C is good.  Kidney function is good. No change in meds.

## 2019-10-09 ENCOUNTER — Ambulatory Visit: Payer: PPO | Admitting: Physician Assistant

## 2019-10-24 ENCOUNTER — Other Ambulatory Visit: Payer: Self-pay | Admitting: Physician Assistant

## 2019-11-06 ENCOUNTER — Ambulatory Visit (INDEPENDENT_AMBULATORY_CARE_PROVIDER_SITE_OTHER): Payer: PPO | Admitting: Physician Assistant

## 2019-11-06 ENCOUNTER — Encounter: Payer: Self-pay | Admitting: Physician Assistant

## 2019-11-06 ENCOUNTER — Other Ambulatory Visit: Payer: Self-pay

## 2019-11-06 DIAGNOSIS — F319 Bipolar disorder, unspecified: Secondary | ICD-10-CM

## 2019-11-06 DIAGNOSIS — G4700 Insomnia, unspecified: Secondary | ICD-10-CM | POA: Diagnosis not present

## 2019-11-06 MED ORDER — LAMOTRIGINE 150 MG PO TABS: ORAL_TABLET | ORAL | 2 refills | Status: DC

## 2019-11-06 MED ORDER — TRAZODONE HCL 50 MG PO TABS: 50.0000 mg | ORAL_TABLET | Freq: Every evening | ORAL | 2 refills | Status: DC | PRN

## 2019-11-06 MED ORDER — RISPERIDONE 3 MG PO TABS: 3.0000 mg | ORAL_TABLET | Freq: Every day | ORAL | 2 refills | Status: DC

## 2019-11-06 NOTE — Progress Notes (Signed)
[facility] Med Check  Patient ID: Margaret Mckenzie,  MRN: [id number]  PCP: [name], DO  Date of Evaluation: 11/06/2019 Time spent:15 minutes  Chief Complaint:  Chief Complaint    Medication Refill; Follow-up      HISTORY/CURRENT STATUS: HPI For routine med check.  Doing really well.  Due to the coronavirus pandemic, she was out of work for 3 months.  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  They had closed the hotel.  She was able to manage financially but it was difficult.  She is now back at work and enjoying that a lot.  She sleeps well.  She feels like her meds are working well. Patient denies loss of interest in usual activities and is able to enjoy things.  Denies decreased energy or motivation.  Appetite has not changed.  No extreme sadness, tearfulness, or feelings of hopelessness.  Denies any changes in concentration, making decisions or remembering things.  Denies suicidal or homicidal thoughts.  Patient denies increased energy with decreased need for sleep, no increased talkativeness, no racing thoughts, no impulsivity or risky behaviors, no increased spending, no increased libido, no grandiosity.  Anxiety is well controlled with the Xanax.  Her other medications help to prevent the anxiety.  No panic attacks are reported.  Denies dizziness, syncope, seizures, numbness, tingling, tremor, tics, unsteady gait, slurred speech, confusion. Denies muscle or joint pain, stiffness, or dystonia.  Individual Medical History/ Review of Systems: Changes? :No   Allergies: Patient has no known allergies.  Current Medications:  Current Outpatient Medications:  .  ALPRAZolam (XANAX) 0.25 MG tablet, Take 1 tablet (0.25 mg total) by mouth 2 (two) times daily as needed for anxiety or sleep., Disp: 60 tablet, Rfl: 5 .  apixaban (ELIQUIS) 5 MG TABS tablet, Take 1 tablet (5 mg total) by mouth 2 (two) times daily., Disp: 180 tablet, Rfl: 3 .  Ascorbic  Acid (VITAMIN C) 1000 MG tablet, Take 1,000 mg by mouth daily., Disp: , Rfl:  .  Calcium Carbonate-Vit D-Min (CALCIUM 1200 PO), Take by mouth., Disp: , Rfl:  .  cholecalciferol (VITAMIN D) 1000 units tablet, Take 1,000 Units by mouth daily., Disp: , Rfl:  .  diltiazem (CARDIZEM) 30 MG tablet, Take 1 tablet every 4 hours AS NEEDED for AFIB heart rate >100, Disp: 45 tablet, Rfl: 1 .  lamoTRIgine (LAMICTAL) 150 MG tablet, TAKE 2 TABLETS BY MOUTH EVERY DAY AT BEDTIME, Disp: 180 tablet, Rfl: 2 .  lisinopril-hydrochlorothiazide (ZESTORETIC) 20-25 MG tablet, Take 1 tablet by mouth daily., Disp: 90 tablet, Rfl: 3 .  Magnesium 500 MG TABS, Take 1 tablet by mouth at bedtime., Disp: , Rfl:  .  risperiDONE (RISPERDAL) 3 MG tablet, Take 1 tablet (3 mg total) by mouth at bedtime., Disp: 90 tablet, Rfl: 2 .  traZODone (DESYREL) 50 MG tablet, Take 1-2 tablets (50-100 mg total) by mouth at bedtime as needed for sleep., Disp: 180 tablet, Rfl: 2 .  triamcinolone ointment (KENALOG) 0.5 %, Apply 1 application topically 2 (two) times daily. (Patient not taking: Reported on 11/06/2019), Disp: 30 g, Rfl: 0  Current Facility-Administered Medications:  .  0.9 %  sodium chloride infusion, 500 mL, Intravenous, Once, Carlean Purl Ofilia Neas, MD Medication Side Effects: none  Family Medical/ Social History: Changes? No  MENTAL HEALTH EXAM:  There were no vitals taken for this visit.There is no height or weight on file to calculate BMI.  General Appearance: Unable to assess  Eye  Contact:  Unable to assess  Speech:  Clear and Coherent  Volume:  Normal  Mood:  Euthymic  Affect:  Unable to assess  Thought Process:  Goal Directed and Descriptions of Associations: Intact  Orientation:  Full (Time, Place, and Person)  Thought Content: Logical   Suicidal Thoughts:  No  Homicidal Thoughts:  No  Memory:  WNL  Judgement:  Good  Insight:  Good  Psychomotor Activity:  Unable to assess  Concentration:  Concentration: Good  Recall:   Good  Fund of Knowledge: Good  Language: Good  Assets:  Desire for Improvement  ADL's:  Intact  Cognition: WNL  Prognosis:  Good  09/08/2019 hemoglobin A1c was 5.1, total cholesterol 226, triglycerides 58, HDL 145, LDL 71, BMP was completely normal  DIAGNOSES:    ICD-10-CM   1. Bipolar I disorder ([place])  F31.9   2. Insomnia, unspecified type  G47.00     Receiving Psychotherapy: No    RECOMMENDATIONS:  PDMP was reviewed. I am glad to see her doing so well! Continue Xanax 0.25 mg 1 twice daily as needed. Continue Lamictal 150 mg 2 nightly. Continue Risperdal 3 mg nightly. Continue trazodone 50 mg, 1-2 nightly as needed sleep. Return in 9 months.  Donnal Moat, PA-C

## 2020-01-12 ENCOUNTER — Other Ambulatory Visit: Payer: Self-pay

## 2020-01-13 ENCOUNTER — Other Ambulatory Visit: Payer: Self-pay

## 2020-01-13 ENCOUNTER — Ambulatory Visit (INDEPENDENT_AMBULATORY_CARE_PROVIDER_SITE_OTHER): Payer: PPO | Admitting: Family Medicine

## 2020-01-13 ENCOUNTER — Encounter: Payer: Self-pay | Admitting: Family Medicine

## 2020-01-13 VITALS — BP 120/84 | HR 74 | Temp 97.3°F | Ht 64.0 in | Wt 140.3 lb

## 2020-01-13 DIAGNOSIS — M7631 Iliotibial band syndrome, right leg: Secondary | ICD-10-CM

## 2020-01-13 MED ORDER — DICLOFENAC SODIUM 1 % EX GEL: 2.0000 g | Freq: Four times a day (QID) | CUTANEOUS | 1 refills | Status: DC

## 2020-01-13 NOTE — Progress Notes (Signed)
Acute Office Visit  Subjective:    Patient ID: Margaret Mckenzie, female    DOB: [birth year], 69 y.o.   MRN: [id number]  Chief Complaint  Patient presents with  . Knee Pain    back and side of right knee, pain radiates up into back of thigh    HPI Patient is in today for pain in lateral right leg below the knee x 2 weeks, worse since Friday. Denies recent injury or history of pain in this area. States hx of right knee pain treated with steroid injection but that pain is always medial. States she was active at time her pain became most severe but did not lose balance. Pain radiates up to the posterior region of hamstring and down her calf approximately 3 inches. Admits leg feels weak when she wakes up and gets out of bed, some tingling. Denies temperature change or loss of sensation. Taking Tylenol regularly with some relief. Denies regular exercise. 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  Past Medical History:  Diagnosis Date  . Bipolar 1 disorder ([place])   . Chicken pox   . Hypertension    high blood pressure readings   . Kidney disease    kidney stones and saw urologist, Dr. Edward Qualia for lithotripsy remotely   . PAF (paroxysmal atrial fibrillation) ([place])     Past Surgical History:  Procedure Laterality Date  . BUNIONECTOMY  2007  . TONSILLECTOMY AND ADENOIDECTOMY  1975    Family History  Problem Relation Age of Onset  . Osteoporosis Mother   . Stroke Mother        TIA, [place]  . Mental retardation Mother   . Hypertension Father   . Heart disease Father 48  . Stroke Father 45  . Mental illness Sister   . Breast cancer Neg Hx     Social History   Socioeconomic History  . Marital status: Divorced    Spouse name: Not on file  . Number of children: Not on file  . Years of education: Not on file  . Highest education level: Not on file  Occupational History  . Not on file  Tobacco Use  . Smoking status: Former Smoker    Packs/day: 0.50      Types: Cigarettes    Quit date: 07/11/2017    Years since quitting: 2.5  . Smokeless tobacco: Never Used  . Tobacco comment: pt has quit smoking  Substance and Sexual Activity  . Alcohol use: Yes    Comment: 1 glass of wine a night   . Drug use: No  . Sexual activity: Not on file  Other Topics Concern  . Not on file  Social History Narrative   Work or School: Educational psychologist at [employer] Situation: lives alone      Spiritual Beliefs: episcopalian      Lifestyle: had been going to Y but currently dealing with meniscal tear; diet good            Social Determinants of Health   Financial Resource Strain:   . Difficulty of Paying Living Expenses: Not on file  Food Insecurity:   . Worried About Charity fundraiser in the Last Year: Not on file  . Ran Out of Food in the Last Year: Not on file  Transportation Needs:   . Lack of Transportation (Medical): Not on file  . Lack of Transportation (Non-Medical): Not on file  Physical Activity:   . Days of Exercise per Week: Not on file  . Minutes of Exercise per Session: Not on file  Stress:   . Feeling of Stress : Not on file  Social Connections:   . Frequency of Communication with Friends and Family: Not on file  . Frequency of Social Gatherings with Friends and Family: Not on file  . Attends Religious Services: Not on file  . Active Member of Clubs or Organizations: Not on file  . Attends Archivist Meetings: Not on file  . Marital Status: Not on file  Intimate Partner Violence:   . Fear of Current or Ex-Partner: Not on file  . Emotionally Abused: Not on file  . Physically Abused: Not on file  . Sexually Abused: Not on file    Outpatient Medications Prior to Visit  Medication Sig Dispense Refill  . ALPRAZolam (XANAX) 0.25 MG tablet Take 1 tablet (0.25 mg total) by mouth 2 (two) times daily as needed for anxiety or sleep. 60 tablet 5  . apixaban (ELIQUIS) 5 MG TABS tablet Take 1 tablet  (5 mg total) by mouth 2 (two) times daily. 180 tablet 3  . Ascorbic Acid (VITAMIN C) 1000 MG tablet Take 1,000 mg by mouth daily.    . Calcium Carbonate-Vit D-Min (CALCIUM 1200 PO) Take by mouth.    . cholecalciferol (VITAMIN D) 1000 units tablet Take 1,000 Units by mouth daily.    Marland Kitchen diltiazem (CARDIZEM) 30 MG tablet Take 1 tablet every 4 hours AS NEEDED for AFIB heart rate >100 45 tablet 1  . lamoTRIgine (LAMICTAL) 150 MG tablet TAKE 2 TABLETS BY MOUTH EVERY DAY AT BEDTIME 180 tablet 2  . lisinopril-hydrochlorothiazide (ZESTORETIC) 20-25 MG tablet Take 1 tablet by mouth daily. 90 tablet 3  . Magnesium 500 MG TABS Take 1 tablet by mouth at bedtime.    . risperiDONE (RISPERDAL) 3 MG tablet Take 1 tablet (3 mg total) by mouth at bedtime. 90 tablet 2  . traZODone (DESYREL) 50 MG tablet Take 1-2 tablets (50-100 mg total) by mouth at bedtime as needed for sleep. 180 tablet 2  . triamcinolone ointment (KENALOG) 0.5 % Apply 1 application topically 2 (two) times daily. 30 g 0   Facility-Administered Medications Prior to Visit  Medication Dose Route Frequency Provider Last Rate Last Admin  . 0.9 %  sodium chloride infusion  500 mL Intravenous Once Gatha Mayer, MD        No Known Allergies  Review of Systems  GEN: Well nourished, well developed, in no acute distress, Alert and interactive HEENT: normocephalic, atraumatic. PERRL, EOMI, good conjugate gaze. Nares symmetric, patent. Moist mucous membranes Neck: Supple, no JVD or masses. Normal ROM Cardiac: rate irregular @ 70 bpm, hx a fib; no murmurs, rubs, or gallops, no edema  Respiratory:  clear to auscultation bilaterally, normal work of breathing GI: soft, nontender, nondistended MS: Gait normal. Normal tone. Normal ROM bilaterally. Strength and sensation intact and equal bilaterally, cap refill <2sec, 2+ distal pulses bilaterally, no deformity or atrophy. Small bruise in advanced stage of healing noted near the area patient identifies as  most painful but appeared prior to onset of pain. Denies hearing a clicking noise with ambulation.  Skin: Intact, warm and dry, no rashes, no lesions, no erythema Neuro:  Alert and Oriented x 3, Cranial nerves II to XII intact, Reflex symmetric, Sensation intact, follows commands, gait normal Psych: euthymic mood, full affect     Objective:    Physical  Exam  BP 120/84   Pulse 74   Temp (!) 97.3 F (36.3 C) (Temporal)   Ht 5\' 4"  (1.626 m)   Wt 140 lb 4.8 oz (63.6 kg)   SpO2 99%   BMI 24.08 kg/m  Wt Readings from Last 3 Encounters:  01/13/20 140 lb 4.8 oz (63.6 kg)  08/29/19 138 lb (62.6 kg)  08/12/19 140 lb (63.5 kg)    There are no preventive care reminders to display for this patient.  There are no preventive care reminders to display for this patient.   Lab Results  Component Value Date   TSH 1.406 06/18/2017   Lab Results  Component Value Date   WBC 7.2 11/19/2018   HGB 13.0 11/19/2018   HCT 40.0 11/19/2018   MCV 92.4 11/19/2018   PLT 282 11/19/2018   Lab Results  Component Value Date   NA 136 09/08/2019   K 5.1 09/08/2019   CO2 27 09/08/2019   GLUCOSE 80 09/08/2019   BUN 12 09/08/2019   CREATININE 0.73 09/08/2019   CALCIUM 9.6 09/08/2019   ANIONGAP 13 11/19/2018   GFR 97.12 09/03/2017   Lab Results  Component Value Date   CHOL 226 (H) 09/08/2019   Lab Results  Component Value Date   HDL 145 09/08/2019   Lab Results  Component Value Date   LDLCALC 71 09/08/2019   Lab Results  Component Value Date   TRIG 58 09/08/2019   Lab Results  Component Value Date   CHOLHDL 1.6 09/08/2019   Lab Results  Component Value Date   HGBA1C 5.1 09/08/2019       Assessment & Plan:   Problem List Items Addressed This Visit    None     ASSESSMENT: 1. Right lateral knee pain. Physical exam with focused knee exam did not reveal any acute abnormalities. No deformity seen. Tenderness at distal insertion site of the IT band at the proximal fibula.  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    PLAN: 1. Information given on using ice at the point of tenderness for 15-20 minutes up to 3 times per day. Advised her to use Velcro bands for pain relief especially when working or on feet for long periods. Will prescribe Diclofenac gel for topical application. Encouraged patient to call back if symptoms do not improve in 2 weeks.    No orders of the defined types were placed in this encounter.    Emmaline Life, RN  AGPCNP Student, [employer]  Agree with above.  Suspect right ITB inflammation at distal insertion to proximal fibula.  She will try the icing and topical Dicofenac gel and velcro band and be in touch if  Not improving over next 2-3 weeks  Eulas Post MD [facility]

## 2020-01-13 NOTE — Patient Instructions (Signed)
Diclofenac skin gel What is this medicine? DICLOFENAC (dye KLOE fen ak) is a non-steroidal anti-inflammatory drug (NSAID). The 1% skin gel is used to treat osteoarthritis of the hands or knees. The 3% skin gel is used to treat actinic keratosis. This medicine may be used for other purposes; ask your health care provider or pharmacist if you have questions. COMMON BRAND NAME(S): DSG Pak, Omeca, Solaravix, Solaraze, Voltaren Arthritis, Voltaren Gel What should I tell my health care provider before I take this medicine? They need to know if you have any of these conditions:  asthma  bleeding problems  coronary artery bypass graft (CABG) surgery within the past 2 weeks  heart disease  high blood pressure  if you frequently drink alcohol containing drinks  kidney disease  liver disease  open or infected skin  stomach problems  an unusual or allergic reaction to diclofenac, aspirin, other NSAIDs, other medicines, benzyl alcohol (3% gel only), foods, dyes, or preservatives  pregnant or trying to get pregnant  breast-feeding How should I use this medicine? This medicine is for external use only. Follow the directions on the prescription label. Wash hands before and after use. Do not get this medicine in your eyes. If you do, rinse out with plenty of cool tap water. Use your doses at regular intervals. Do not use your medicine more often than directed. A special MedGuide will be given to you by the pharmacist with each prescription and refill of the 1% gel. Be sure to read this information carefully each time. Talk to your pediatrician regarding the use of this medicine in children. Special care may be needed. The 3% gel is not approved for use in children. Overdosage: If you think you have taken too much of this medicine contact a poison control center or emergency room at once. NOTE: This medicine is only for you. Do not share this medicine with others. What if I miss a dose? If you  miss a dose, use it as soon as you can. If it is almost time for your next dose, use only that dose. Do not use double or extra doses. What may interact with this medicine?  aspirin  NSAIDs, medicines for pain and inflammation, like ibuprofen or naproxen Do not use any other skin products without telling your doctor or health care professional. This list may not describe all possible interactions. Give your health care provider a list of all the medicines, herbs, non-prescription drugs, or dietary supplements you use. Also tell them if you smoke, drink alcohol, or use illegal drugs. Some items may interact with your medicine. What should I watch for while using this medicine? Tell your doctor or healthcare provider if your symptoms do not start to get better or if they get worse. You will need to follow up with your healthcare provider to monitor your progress. You may need to be treated for up to 3 months if you are using the 3% gel, but the full effect may not occur until 1 month after stopping treatment. If you develop a severe skin reaction, contact your doctor or healthcare provider immediately. This medicine may cause serious skin reactions. They can happen weeks to months after starting the medicine. Contact your healthcare provider right away if you notice fevers or flu-like symptoms with a rash. The rash may be red or purple and then turn into blisters or peeling of the skin. Or, you might notice a red rash with swelling of the face, lips or lymph nodes in  your neck or under your arms. This medicine can make you more sensitive to the sun. Keep out of the sun. If you cannot avoid being in the sun, wear protective clothing and use sunscreen. Do not use sun lamps or tanning beds/booths. Do not take medicines such as ibuprofen and naproxen with this medicine. Side effects such as stomach upset, nausea, or ulcers may be more likely to occur. Many medicines available without a prescription should not  be taken with this medicine. This medicine does not prevent heart attack or stroke. In fact, this medicine may increase the chance of a heart attack or stroke. The chance may increase with longer use of this medicine and in people who have heart disease. If you take aspirin to prevent heart attack or stroke, talk with your doctor or healthcare provider. This medicine can cause ulcers and bleeding in the stomach and intestines at any time during treatment. Do not smoke cigarettes or drink alcohol. These increase irritation to your stomach and can make it more susceptible to damage from this medicine. Ulcers and bleeding can happen without warning symptoms and can cause death. You may get drowsy or dizzy. Do not drive, use machinery, or do anything that needs mental alertness until you know how this medicine affects you. Do not stand or sit up quickly, especially if you are an older patient. This reduces the risk of dizzy or fainting spells. This medicine can cause you to bleed more easily. Try to avoid damage to your teeth and gums when you brush or floss your teeth. What side effects may I notice from receiving this medicine? Side effects that you should report to your doctor or health care professional as soon as possible:  allergic reactions like skin rash, itching or hives, swelling of the face, lips, or tongue  black or bloody stools, blood in the urine or vomit  blurred vision  chest pain  difficulty breathing or wheezing  nausea or vomiting  rash, fever, and swollen lymph nodes  redness, blistering, peeling or loosening of the skin, including inside the mouth  slurred speech or weakness on one side of the body  trouble passing urine or change in the amount of urine  unexplained weight gain or swelling  unusually weak or tired  yellowing of eyes or skin Side effects that usually do not require medical attention (report to your doctor or health care professional if they continue  or are bothersome):  dizziness  dry skin  headache  heartburn  increased sensitivity to the sun  stomach pain  tingling at the application site This list may not describe all possible side effects. Call your doctor for medical advice about side effects. You may report side effects to FDA at 1-800-FDA-1088. Where should I keep my medicine? Keep out of the reach of children. Store the 1% gel at room temperature between 15 and 30 degrees C (59 and 86 degrees F). Store the 3% gel at room temperature between 20 and 25 degrees C (68 and 77 degrees F). Protect from light. Throw away any unused medicine after the expiration date. NOTE: This sheet is a summary. It may not cover all possible information. If you have questions about this medicine, talk to your doctor, pharmacist, or health care provider.  2020 Elsevier/Gold Standard (2019-01-29 13:05:18)   Iliotibial Band Syndrome  Iliotibial band syndrome (ITBS) is a condition that often causes knee pain. It can also cause pain in the outside of your hip, thigh, and knee. The  iliotibial band is a strip of tissue that runs from the outside of your hip and down your thigh to the outside of your knee. Repeatedly bending and straightening your knee can irritate the iliotibial band. What are the causes? This condition is caused by inflammation and irritation from the friction of the iliotibial band moving over the thigh bone (femur) when you repeatedly bend and straighten your knee. What increases the risk? This condition is more likely to develop in people who:  Frequently change elevation during their workouts.  Run very long distances.  Recently increased the length or intensity of their workouts.  Run downhill often, or just started running downhill.  Ride a bike very far or often. You may also be at greater risk if you start a new workout routine without first warming up or if you have a job that requires you to bend, squat, or climb  frequently. What are the signs or symptoms? Symptoms of this condition include:  Pain along the outside of your knee that may be worse with activity, especially running or going up and down stairs.  A "snapping" sensation over your knee.  Swelling on the outside of your knee.  Pain or a feeling of tightness in your hip. How is this diagnosed? This condition is diagnosed based on your symptoms, medical history, and physical exam. You may also see a health care provider who specializes in reducing pain and increasing mobility (physical therapist). A physical therapist may do an exam to check your balance, movement, and way of walking or running (gait) to see whether the way you move could contribute to your injury. You may also have tests to measure your strength, flexibility, and range of motion. How is this treated? Treatment for this condition includes:  Resting and limiting exercise.  Returning to activities gradually.  Doing range-of-motion and strengthening exercises (physical therapy) as told by your health care provider.  Including low-impact activities, such as swimming, in your exercise routine. Follow these instructions at home:  If directed, apply ice to the injured area. ? Put ice in a plastic bag. ? Place a towel between your skin and the bag. ? Leave the ice on for 20 minutes, 2-3 times per day.  Return to your normal activities as told by your health care provider. Ask your health care provider what activities are safe for you.  Keep all follow-up visits with your health care provider. This is important. Contact a health care provider if:  Your pain does not improve or gets worse despite treatment. This information is not intended to replace advice given to you by your health care provider. Make sure you discuss any questions you have with your health care provider. Document Revised: 10/26/2017 Document Reviewed: 12/15/2016 Elsevier Patient Education  [place].

## 2020-01-19 ENCOUNTER — Other Ambulatory Visit: Payer: Self-pay | Admitting: Cardiology

## 2020-01-20 ENCOUNTER — Telehealth: Payer: Self-pay

## 2020-01-20 NOTE — Telephone Encounter (Signed)
   [facility] Pre-operative Risk Assessment    Request for surgical clearance:  1. What type of surgery is being performed? EXTRACTION TOOTH #19 (LOWER LEFT MOLAR)D/T ABCESS  2. When is this surgery scheduled? TBD (ASAP)   3. What type of clearance is required (medical clearance vs. Pharmacy clearance to hold med vs. Both)? MEDICATION  4. Are there any medications that need to be held prior to surgery and how long?  ELIQUIS   5. Practice name and name of physician performing surgery? Meliton Rattan, DDS   6. What is your office phone number  [phone]    7.   What is your office fax number  [phone]  8.   Anesthesia type (None, local, MAC, general) ?  NOT LISTED  PER NOTE FROM DDS: ROUTINE EXTRACTION USING AN ELEVATOR AND FORCEPS, DO NOT ANTICIPATE MUCH BLEEDING, SOFT TISSUE WILL BE MANIPULATED. COULD/SHOULD SHE BE OFF ELIQUIS?

## 2020-01-20 NOTE — Telephone Encounter (Signed)
For single tooth extraction we do not recommend stopping Eliquis.    Pt does not need antibiotics from cardiac perspective.

## 2020-02-18 ENCOUNTER — Ambulatory Visit: Payer: PPO | Admitting: Physician Assistant

## 2020-03-02 ENCOUNTER — Encounter: Payer: Self-pay | Admitting: Family Medicine

## 2020-03-02 ENCOUNTER — Other Ambulatory Visit: Payer: Self-pay

## 2020-03-02 ENCOUNTER — Ambulatory Visit (INDEPENDENT_AMBULATORY_CARE_PROVIDER_SITE_OTHER): Payer: PPO | Admitting: Family Medicine

## 2020-03-02 VITALS — BP 120/76 | HR 84 | Temp 97.9°F | Resp 16 | Ht 64.0 in | Wt 136.0 lb

## 2020-03-02 DIAGNOSIS — H9312 Tinnitus, left ear: Secondary | ICD-10-CM | POA: Diagnosis not present

## 2020-03-02 DIAGNOSIS — R251 Tremor, unspecified: Secondary | ICD-10-CM | POA: Diagnosis not present

## 2020-03-02 DIAGNOSIS — R42 Dizziness and giddiness: Secondary | ICD-10-CM | POA: Diagnosis not present

## 2020-03-02 LAB — CBC
HCT: 38.2 % (ref 36.0–46.0)
Hemoglobin: 12.7 g/dL (ref 12.0–15.0)
MCHC: 33.3 g/dL (ref 30.0–36.0)
MCV: 91.2 fl (ref 78.0–100.0)
Platelets: 244 10*3/uL (ref 150.0–400.0)
RBC: 4.18 Mil/uL (ref 3.87–5.11)
RDW: 13 % (ref 11.5–15.5)
WBC: 4.4 10*3/uL (ref 4.0–10.5)

## 2020-03-02 LAB — COMPREHENSIVE METABOLIC PANEL
ALT: 11 U/L (ref 0–35)
AST: 16 U/L (ref 0–37)
Albumin: 4.4 g/dL (ref 3.5–5.2)
Alkaline Phosphatase: 57 U/L (ref 39–117)
BUN: 8 mg/dL (ref 6–23)
CO2: 30 mEq/L (ref 19–32)
Calcium: 9.5 mg/dL (ref 8.4–10.5)
Chloride: 94 mEq/L — ABNORMAL LOW (ref 96–112)
Creatinine, Ser: 0.69 mg/dL (ref 0.40–1.20)
GFR: 84.65 mL/min (ref >60.00)
Glucose, Bld: 91 mg/dL (ref 70–99)
Potassium: 4.1 mEq/L (ref 3.5–5.1)
Sodium: 133 mEq/L — ABNORMAL LOW (ref 135–145)
Total Bilirubin: 0.3 mg/dL (ref 0.2–1.2)
Total Protein: 6.8 g/dL (ref 6.0–8.3)

## 2020-03-02 MED ORDER — MECLIZINE HCL 25 MG PO TABS: 25.0000 mg | ORAL_TABLET | Freq: Two times a day (BID) | ORAL | 0 refills | Status: DC | PRN

## 2020-03-02 NOTE — Progress Notes (Signed)
ACUTE VISIT  Chief Complaint  Patient presents with  . Dizziness    started yesterday   HPI: Margaret Mckenzie is a 69 y.o. female with history of A. fib on chronic anticoagulation, bipolar disorder, and hypertension who is here today with her friend complaining of sudden new onset of dizziness yesterday afternoon.    The day before dizziness started she noticed some "roaring" like noice in left ear and constant decreased hearing. Lightheaded and sometimes spinning-like sensation that has happened while she is in bed, when getting up, or been seated. Intermittent episodes the last several minutes. She feels her eyes "rolling around."   Alleviated by being still.  Negative for fever, chills, changes in appetite, visual changes, sore throat, dysphagia, CP, palpitation, dyspnea, N/V, unusual headache, or focal weakness.  She denies any prior history of vertigo.  Negative for recent URI or travel. She has not tried OTC medications. It seems to be stable.  She drinks wine daily, around 6 ounces. Noted on examination mild head and hand tremor, she is reporting this is a new problem that she did not notice before today. She has not noted focal deficit.  Review of Systems  Constitutional: Positive for activity change and fatigue.  HENT: Negative for ear discharge, ear pain, mouth sores, nosebleeds and trouble swallowing.   Respiratory: Negative for cough and wheezing.   Cardiovascular: Negative for leg swelling.  Gastrointestinal: Negative for abdominal pain.       Negative for changes in bowel habits.  Endocrine: Negative for polydipsia and polyphagia.  Genitourinary: Negative for decreased urine volume, dysuria and hematuria.  Musculoskeletal: Positive for gait problem. Negative for myalgias.  Skin: Negative for pallor and rash.  Neurological: Negative for syncope and facial asymmetry.  Psychiatric/Behavioral: Negative for confusion.  Rest see pertinent positives  and negatives per HPI.  Current Outpatient Medications on File Prior to Visit  Medication Sig Dispense Refill  . ALPRAZolam (XANAX) 0.25 MG tablet Take 1 tablet (0.25 mg total) by mouth 2 (two) times daily as needed for anxiety or sleep. 60 tablet 5  . apixaban (ELIQUIS) 5 MG TABS tablet Take 1 tablet (5 mg total) by mouth 2 (two) times daily. 180 tablet 3  . Ascorbic Acid (VITAMIN C) 1000 MG tablet Take 1,000 mg by mouth daily.    . Calcium Carbonate-Vit D-Min (CALCIUM 1200 PO) Take by mouth.    . cholecalciferol (VITAMIN D) 1000 units tablet Take 1,000 Units by mouth daily.    Marland Kitchen diltiazem (CARDIZEM) 30 MG tablet Take 1 tablet every 4 hours AS NEEDED for AFIB heart rate >100 45 tablet 1  . lamoTRIgine (LAMICTAL) 150 MG tablet TAKE 2 TABLETS BY MOUTH EVERY DAY AT BEDTIME 180 tablet 2  . lisinopril-hydrochlorothiazide (ZESTORETIC) 20-25 MG tablet TAKE 1/2 TABLET BY MOUTH EVERY DAY 45 tablet 1  . Magnesium 500 MG TABS Take 1 tablet by mouth at bedtime.    . risperiDONE (RISPERDAL) 3 MG tablet Take 1 tablet (3 mg total) by mouth at bedtime. 90 tablet 2  . traZODone (DESYREL) 50 MG tablet Take 1-2 tablets (50-100 mg total) by mouth at bedtime as needed for sleep. 180 tablet 2   Current Facility-Administered Medications on File Prior to Visit  Medication Dose Route Frequency Provider Last Rate Last Admin  . 0.9 %  sodium chloride infusion  500 mL Intravenous Once Gatha Mayer, MD       Past Medical History:  Diagnosis Date  . Bipolar 1  disorder ([place])   . Chicken pox   . Hypertension    high blood pressure readings   . Kidney disease    kidney stones and saw urologist, Dr. Edward Qualia for lithotripsy remotely   . PAF (paroxysmal atrial fibrillation) ([facility])    No Known Allergies  Social History   Socioeconomic History  . Marital status: Divorced    Spouse name: Not on file  . Number of children: Not on file  . Years of education: Not on file  . Highest education level: Not on file   Occupational History  . Not on file  Tobacco Use  . Smoking status: Former Smoker    Packs/day: 0.50    Types: Cigarettes    Quit date: 07/11/2017    Years since quitting: 2.6  . Smokeless tobacco: Never Used  . Tobacco comment: pt has quit smoking  Substance and Sexual Activity  . Alcohol use: Yes    Comment: 1 glass of wine a night   . Drug use: No  . Sexual activity: Not on file  Other Topics Concern  . Not on file  Social History Narrative   Work or School: Educational psychologist at [employer] Situation: lives alone      Spiritual Beliefs: episcopalian      Lifestyle: had been going to Y but currently dealing with meniscal tear; diet good            Social Determinants of Health   Financial Resource Strain:   . Difficulty of Paying Living Expenses:   Food Insecurity:   . Worried About Charity fundraiser in the Last Year:   . Arboriculturist in the Last Year:   Transportation Needs:   . Film/video editor (Medical):   Marland Kitchen Lack of Transportation (Non-Medical):   Physical Activity:   . Days of Exercise per Week:   . Minutes of Exercise per Session:   Stress:   . Feeling of Stress :   Social Connections:   . Frequency of Communication with Friends and Family:   . Frequency of Social Gatherings with Friends and Family:   . Attends Religious Services:   . Active Member of Clubs or Organizations:   . Attends Archivist Meetings:   Marland Kitchen Marital Status:     Vitals:   03/02/20 0705  BP: 120/76  Pulse: 84  Resp: 16  Temp: 97.9 F (36.6 C)  SpO2: 97%   Body mass index is 23.34 kg/m.   Physical Exam  Nursing note and vitals reviewed. Constitutional: She is oriented to person, place, and time. She appears well-developed and well-nourished. She does not appear ill. No distress.  HENT:  Head: Normocephalic and atraumatic.  Right Ear: Hearing, tympanic membrane, external ear and ear canal normal.  Left Ear: Hearing, external ear and  ear canal normal. Tympanic membrane is scarred.  Mouth/Throat: Oropharynx is clear and moist and mucous membranes are normal.  Apley maneuver elicit dizziness, R>L  Eyes: Pupils are equal, round, and reactive to light. Conjunctivae are normal. Right eye exhibits nystagmus. Left eye exhibits nystagmus. Pupils are equal.  Neck: Carotid bruit is not present.  Cardiovascular: Normal rate and regular rhythm.  No murmur heard. Respiratory: Effort normal and breath sounds normal. No respiratory distress.  GI: Soft. She exhibits no mass. There is no hepatomegaly. There is no abdominal tenderness.  Musculoskeletal:        General: No tenderness or edema.  Lymphadenopathy:  She has no cervical adenopathy.  Neurological: She is alert and oriented to person, place, and time. She has normal strength. She displays tremor (Mainly head and milder in hands.). No cranial nerve deficit. She displays a negative Romberg sign. Gait (wide base and short steps at the time) abnormal. Coordination normal.  Pronator drift negative. Unstable gait. DTR's (biceps and patellar) 1+ to 2+ bilateral, symmetric.  Skin: Skin is warm. No rash noted. No erythema.  Psychiatric: She has a normal mood and affect.  Appropriately groomed, good eye contact.   ASSESSMENT AND PLAN:  Margaret Mckenzie was seen today for dizziness.  Diagnoses and all orders for this visit:  Orders Placed This Encounter  Procedures  . MR Brain W Wo Contrast  . CBC  . Comprehensive metabolic panel   Lab Results  Component Value Date   CREATININE 0.69 03/02/2020   BUN 8 03/02/2020   NA 133 (L) 03/02/2020   K 4.1 03/02/2020   CL 94 (L) 03/02/2020   CO2 30 03/02/2020   Lab Results  Component Value Date   ALT 11 03/02/2020   AST 16 03/02/2020   ALKPHOS 57 03/02/2020   BILITOT 0.3 03/02/2020   Lab Results  Component Value Date   WBC 4.4 03/02/2020   HGB 12.7 03/02/2020   HCT 38.2 03/02/2020   MCV 91.2 03/02/2020   PLT 244.0 03/02/2020     Tremor Noted during examination and reported by pt as a new problem. We discussed possible etiologies. Essential tremor,parkinson's, cerebellar syndrome, and medications among some to consider.  Dizziness We discussed possible causes, including more serious process as CVA given the sudden onset of symptoms. Apley maneuver elicited dizziness.  Nystagmus noted during examination but more noticeable with maneuver. Part of the hx suggest positional vertigo/peripheral vertigo but the associated tremor and gait abnormality warrant brain imaging. For now we will hold on vestibular exercises. Meclizine 25 mg twice daily as needed may help, some side effects discussed. Fall precautions.  Further recommendation will be given according to lab/imaging results She was clearly instructed about warning signs.  Tinnitus, left ear New onset. With associated dizziness and mild hearing loss, beginning of meniere disease and vestibulitis are some to consider. Gross hearing left ear intact, decreased when compared with right ear. Rinner test bone conduction > air conduction left ear, weber test lateralized to right ear.   Return in about 1 week (around 03/09/2020).    Laycee Fitzsimmons G. [place], MD  [facility]. [place] office.    A few things to remember from today's visit:   Vertigo Vertigo is the feeling that you or the things around you are moving when they are not. This feeling can come and go at any time. Vertigo often goes away on its own. This condition can be dangerous if it happens when you are doing activities like driving or working with machines. Your doctor will do tests to find the cause of your vertigo. These tests will also help your doctor decide on the best treatment for you. Follow these instructions at home: Eating and drinking      Drink enough fluid to keep your pee (urine) pale yellow.  Do not drink alcohol. Activity  Return to your normal activities as told by  your doctor. Ask your doctor what activities are safe for you.  In the morning, first sit up on the side of the bed. When you feel okay, stand slowly while you hold onto something until you know that your balance is  fine.  Move slowly. Avoid sudden body or head movements or certain positions, as told by your doctor.  Use a cane if you have trouble standing or walking.  Sit down right away if you feel dizzy.  Avoid doing any tasks or activities that can cause danger to you or others if you get dizzy.  Avoid bending down if you feel dizzy. Place items in your home so that they are easy for you to reach without leaning over.  Do not drive or use heavy machinery if you feel dizzy. General instructions  Take over-the-counter and prescription medicines only as told by your doctor.  Keep all follow-up visits as told by your doctor. This is important. Contact a doctor if:  Your medicine does not help your vertigo.  You have a fever.  Your problems get worse or you have new symptoms.  Your family or friends see changes in your behavior.  The feeling of being sick to your stomach gets worse.  Your vomiting gets worse.  You lose feeling (have numbness) in part of your body.  You feel prickling and tingling in a part of your body. Get help right away if:  You have trouble moving or talking.  You are always dizzy.  You pass out (faint).  You get very bad headaches.  You feel weak in your hands, arms, or legs.  You have changes in your hearing.  You have changes in how you see (vision).  You get a stiff neck.  Bright light starts to bother you. Summary  Vertigo is the feeling that you or the things around you are moving when they are not.  Your doctor will do tests to find the cause of your vertigo.  You may be told to avoid some tasks, positions, or movements.  Contact a doctor if your medicine is not helping, or if you have a fever, new symptoms, or a change in  behavior.  Get help right away if you get very bad headaches, or if you have changes in how you speak, hear, or see. This information is not intended to replace advice given to you by your health care provider. Make sure you discuss any questions you have with your health care provider. Document Revised: 10/07/2018 Document Reviewed: 10/07/2018 Elsevier Patient Education  [place].   Please be sure medication list is accurate. If a new problem present, please set up appointment sooner than planned today.

## 2020-03-02 NOTE — Patient Instructions (Signed)
A few things to remember from today's visit:   Vertigo Vertigo is the feeling that you or the things around you are moving when they are not. This feeling can come and go at any time. Vertigo often goes away on its own. This condition can be dangerous if it happens when you are doing activities like driving or working with machines. Your doctor will do tests to find the cause of your vertigo. These tests will also help your doctor decide on the best treatment for you. Follow these instructions at home: Eating and drinking      Drink enough fluid to keep your pee (urine) pale yellow.  Do not drink alcohol. Activity  Return to your normal activities as told by your doctor. Ask your doctor what activities are safe for you.  In the morning, first sit up on the side of the bed. When you feel okay, stand slowly while you hold onto something until you know that your balance is fine.  Move slowly. Avoid sudden body or head movements or certain positions, as told by your doctor.  Use a cane if you have trouble standing or walking.  Sit down right away if you feel dizzy.  Avoid doing any tasks or activities that can cause danger to you or others if you get dizzy.  Avoid bending down if you feel dizzy. Place items in your home so that they are easy for you to reach without leaning over.  Do not drive or use heavy machinery if you feel dizzy. General instructions  Take over-the-counter and prescription medicines only as told by your doctor.  Keep all follow-up visits as told by your doctor. This is important. Contact a doctor if:  Your medicine does not help your vertigo.  You have a fever.  Your problems get worse or you have new symptoms.  Your family or friends see changes in your behavior.  The feeling of being sick to your stomach gets worse.  Your vomiting gets worse.  You lose feeling (have numbness) in part of your body.  You feel prickling and tingling in a part of  your body. Get help right away if:  You have trouble moving or talking.  You are always dizzy.  You pass out (faint).  You get very bad headaches.  You feel weak in your hands, arms, or legs.  You have changes in your hearing.  You have changes in how you see (vision).  You get a stiff neck.  Bright light starts to bother you. Summary  Vertigo is the feeling that you or the things around you are moving when they are not.  Your doctor will do tests to find the cause of your vertigo.  You may be told to avoid some tasks, positions, or movements.  Contact a doctor if your medicine is not helping, or if you have a fever, new symptoms, or a change in behavior.  Get help right away if you get very bad headaches, or if you have changes in how you speak, hear, or see. This information is not intended to replace advice given to you by your health care provider. Make sure you discuss any questions you have with your health care provider. Document Revised: 10/07/2018 Document Reviewed: 10/07/2018 Elsevier Patient Education  [place].   Please be sure medication list is accurate. If a new problem present, please set up appointment sooner than planned today.

## 2020-03-03 ENCOUNTER — Ambulatory Visit (INDEPENDENT_AMBULATORY_CARE_PROVIDER_SITE_OTHER): Payer: PPO | Admitting: Physician Assistant

## 2020-03-03 ENCOUNTER — Encounter: Payer: Self-pay | Admitting: Physician Assistant

## 2020-03-03 DIAGNOSIS — F410 Panic disorder [episodic paroxysmal anxiety] without agoraphobia: Secondary | ICD-10-CM | POA: Diagnosis not present

## 2020-03-03 DIAGNOSIS — G4700 Insomnia, unspecified: Secondary | ICD-10-CM | POA: Diagnosis not present

## 2020-03-03 DIAGNOSIS — F319 Bipolar disorder, unspecified: Secondary | ICD-10-CM | POA: Diagnosis not present

## 2020-03-03 MED ORDER — BUSPIRONE HCL 15 MG PO TABS: ORAL_TABLET | ORAL | 1 refills | Status: DC

## 2020-03-03 MED ORDER — ALPRAZOLAM 0.5 MG PO TABS: 0.5000 mg | ORAL_TABLET | Freq: Three times a day (TID) | ORAL | 1 refills | Status: DC | PRN

## 2020-03-03 NOTE — Progress Notes (Signed)
[facility] Check  Patient ID: Margaret Mckenzie,  MRN: [id number]  PCP: [name], DO  Date of Evaluation: 03/03/2020 Time spent:20 minutes  Chief Complaint:  Chief Complaint    Anxiety; Depression; Insomnia     Virtual Visit via Telephone Note  I connected with patient by a video enabled telemedicine application or telephone, with their informed consent, and verified patient privacy and that I am speaking with the correct person using two identifiers.  I am private, in my office and the patient is home.  I discussed the limitations, risks, security and privacy concerns of performing an evaluation and management service by telephone and the availability of in person appointments. I also discussed with the patient that there may be a patient responsible charge related to this service. The patient expressed understanding and agreed to proceed.   I discussed the assessment and treatment plan with the patient. The patient was provided an opportunity to ask questions and all were answered. The patient agreed with the plan and demonstrated an understanding of the instructions.   The patient was advised to call back or seek an in-person evaluation if the symptoms worsen or if the condition fails to improve as anticipated.  I provided 20 minutes of non-face-to-face time during this encounter.  HISTORY/CURRENT STATUS: HPI Not doing well.  More anxious. Xanax doesn't help. Has gotten worse over past 6 months since a lot has gone on. Had her roof replaced, needed a new car b/c her old one was 68 years old, refinanced her home, her position at work has changed several times in the past 9 months or so, plus all the restrictions that have occurred due to [place].  Has some panic attacks out of the blue.  She does have some shortness of breath and palpitations and sweaty palms at times if the anxiety is too bad.  Has racing thoughts in the evening.  When she wakes up in the middle the  night to go to the restroom, she has trouble falling back asleep because of the racing thoughts.  The Xanax will help her get drowsy to doze off back to sleep.  She does have vivid dreams and sometimes nightmares but they are no worse than normal.  It is just not effective during the day.  Patient denies loss of interest in usual activities and is able to enjoy things.  Denies decreased energy or motivation.  Appetite has not changed.  No extreme sadness, tearfulness, or feelings of hopelessness.  Denies any changes in concentration, making decisions or remembering things.  Denies suicidal or homicidal thoughts.  Patient denies increased energy with decreased need for sleep, no increased talkativeness, no racing thoughts, no impulsivity or risky behaviors, no increased spending, no increased libido, no grandiosity, no increased irritability or anger, and no hallucinations.  Denies dizziness, syncope, seizures, numbness, tingling, tremor, tics, unsteady gait, slurred speech, confusion. Denies muscle or joint pain, stiffness, or dystonia.  Individual Medical History/ Review of Systems: Changes? :No    Past medications for mental health diagnoses include: Depakote, Lamictal, Prozac, Lexapro, questionable Ambien, Xanax, Sonata, Risperdal  Allergies: Patient has no known allergies.  Current Medications:  Current Outpatient Medications:  .  apixaban (ELIQUIS) 5 MG TABS tablet, Take 1 tablet (5 mg total) by mouth 2 (two) times daily., Disp: 180 tablet, Rfl: 3 .  Ascorbic Acid (VITAMIN C) 1000 MG tablet, Take 1,000 mg by mouth daily., Disp: , Rfl:  .  Calcium Carbonate-Vit D-Min (CALCIUM 1200 PO), Take  by mouth., Disp: , Rfl:  .  cholecalciferol (VITAMIN D) 1000 units tablet, Take 1,000 Units by mouth daily., Disp: , Rfl:  .  diltiazem (CARDIZEM) 30 MG tablet, Take 1 tablet every 4 hours AS NEEDED for AFIB heart rate >100, Disp: 45 tablet, Rfl: 1 .  lamoTRIgine (LAMICTAL) 150 MG tablet, TAKE 2 TABLETS  BY MOUTH EVERY DAY AT BEDTIME, Disp: 180 tablet, Rfl: 2 .  lisinopril-hydrochlorothiazide (ZESTORETIC) 20-25 MG tablet, TAKE 1/2 TABLET BY MOUTH EVERY DAY, Disp: 45 tablet, Rfl: 1 .  Magnesium 500 MG TABS, Take 1 tablet by mouth at bedtime., Disp: , Rfl:  .  meclizine (ANTIVERT) 25 MG tablet, Take 1 tablet (25 mg total) by mouth 2 (two) times daily as needed for dizziness., Disp: 30 tablet, Rfl: 0 .  risperiDONE (RISPERDAL) 3 MG tablet, Take 1 tablet (3 mg total) by mouth at bedtime., Disp: 90 tablet, Rfl: 2 .  traZODone (DESYREL) 50 MG tablet, Take 1-2 tablets (50-100 mg total) by mouth at bedtime as needed for sleep., Disp: 180 tablet, Rfl: 2 .  ALPRAZolam (XANAX) 0.5 MG tablet, Take 1 tablet (0.5 mg total) by mouth 3 (three) times daily as needed for anxiety., Disp: 90 tablet, Rfl: 1 .  busPIRone (BUSPAR) 15 MG tablet, 1/3 po bid for 1 week, then 2/3 po bid for 1 week, then 1 po bid, Disp: 60 tablet, Rfl: 1  Current Facility-Administered Medications:  .  0.9 %  sodium chloride infusion, 500 mL, Intravenous, Once, Carlean Purl Ofilia Neas, MD Medication Side Effects: none  Family Medical/ Social History: Changes? No  MENTAL HEALTH EXAM:  There were no vitals taken for this visit.There is no height or weight on file to calculate BMI.  General Appearance: Unable to assess  Eye Contact:  Unable to assess  Speech:  Clear and Coherent and Normal Rate  Volume:  Normal  Mood:  Anxious  Affect:  Unable to assess  Thought Process:  Goal Directed and Descriptions of Associations: Intact  Orientation:  Full (Time, Place, and Person)  Thought Content: Logical   Suicidal Thoughts:  No  Homicidal Thoughts:  No  Memory:  WNL  Judgement:  Good  Insight:  Good  Psychomotor Activity:  Unable to assess  Concentration:  Concentration: Good  Recall:  Good  Fund of Knowledge: Good  Language: Good  Assets:  Desire for Improvement  ADL's:  Intact  Cognition: WNL  Prognosis:  Good    DIAGNOSES:     ICD-10-CM   1. Panic disorder  F41.0   2. Bipolar I disorder ([place])  F31.9   3. Insomnia, unspecified type  G47.00     Receiving Psychotherapy: No    RECOMMENDATIONS:  PDMP reviewed. I spent 30 minutes with her. We went over a few options.  One would be to increase the Risperdal which can help with the nightmares, sleep and anxiety.  Another option would be to add BuSpar.  And we could increase the Xanax for a rescue medicine.  After discussing the pros and cons of all these options we agreed to add BuSpar and increase the Xanax. Benefits, risks, side effects of BuSpar were given and she accepts. Start BuSpar 15 mg 1/3 tablet twice daily for 1 week, then increase to 2/3 tablet twice daily for 1 week, then increase to 1 tablet twice daily for anxiety. Increase Xanax to 0.5 mg, 1 p.o. 3 times daily as needed. Continue Lamictal 150 mg, 2 p.o. nightly. Continue Risperdal 3 mg, 1 p.o. nightly.  Continue trazodone 50 mg, 1-2 nightly as needed sleep. Return in 4 to 6 weeks.  Donnal Moat, PA-C

## 2020-03-09 ENCOUNTER — Encounter: Payer: Self-pay | Admitting: Family Medicine

## 2020-03-09 ENCOUNTER — Telehealth (INDEPENDENT_AMBULATORY_CARE_PROVIDER_SITE_OTHER): Payer: PPO | Admitting: Family Medicine

## 2020-03-09 VITALS — Temp 98.7°F | Wt 136.0 lb

## 2020-03-09 DIAGNOSIS — H9192 Unspecified hearing loss, left ear: Secondary | ICD-10-CM | POA: Diagnosis not present

## 2020-03-09 DIAGNOSIS — H9312 Tinnitus, left ear: Secondary | ICD-10-CM

## 2020-03-09 DIAGNOSIS — R42 Dizziness and giddiness: Secondary | ICD-10-CM | POA: Diagnosis not present

## 2020-03-09 NOTE — Progress Notes (Signed)
Virtual Visit via Video Note  I connected with Margaret Mckenzie  on 03/09/20 at  3:00 PM EDT by a video enabled telemedicine application and verified that I am speaking with the correct person using two identifiers.  Location patient: home, [place] Location provider:work or home office Persons participating in the virtual visit: patient, provider, sister Rod Holler  I discussed the limitations of evaluation and management by telemedicine and the availability of in person appointments. The patient expressed understanding and agreed to proceed.   HPI:  Acute visit for hearing issues: -this started last week -symptoms: L ear w/ muffled hearing - a little clearer today -also has had some dizziness and balance issues, better than last week -she saw Dr. Martinique last week, meclizine helped a little -she had CBC and CMP and has an MRI scheduled for next week -denies HA, fevers, vision changes or any other neuroodeficits    ROS: See pertinent positives and negatives per HPI.  Past Medical History:  Diagnosis Date  . Bipolar 1 disorder ([place])   . Chicken pox   . Hypertension    high blood pressure readings   . Kidney disease    kidney stones and saw urologist, Dr. Edward Qualia for lithotripsy remotely   . PAF (paroxysmal atrial fibrillation) ([place])     Past Surgical History:  Procedure Laterality Date  . BUNIONECTOMY  2007  . TONSILLECTOMY AND ADENOIDECTOMY  1975    Family History  Problem Relation Age of Onset  . Osteoporosis Mother   . Stroke Mother        TIA, [place]  . Mental retardation Mother   . Hypertension Father   . Heart disease Father 34  . Stroke Father 34  . Mental illness Sister   . Breast cancer Neg Hx     SOCIAL HX: see hpi   Current Outpatient Medications:  .  ALPRAZolam (XANAX) 0.5 MG tablet, Take 1 tablet (0.5 mg total) by mouth 3 (three) times daily as needed for anxiety., Disp: 90 tablet, Rfl: 1 .  apixaban (ELIQUIS) 5 MG TABS tablet, Take 1 tablet (5 mg total) by mouth 2  (two) times daily., Disp: 180 tablet, Rfl: 3 .  Ascorbic Acid (VITAMIN C) 1000 MG tablet, Take 1,000 mg by mouth daily., Disp: , Rfl:  .  busPIRone (BUSPAR) 15 MG tablet, 1/3 po bid for 1 week, then 2/3 po bid for 1 week, then 1 po bid, Disp: 60 tablet, Rfl: 1 .  Calcium Carbonate-Vit D-Min (CALCIUM 1200 PO), Take by mouth., Disp: , Rfl:  .  cholecalciferol (VITAMIN D) 1000 units tablet, Take 1,000 Units by mouth daily., Disp: , Rfl:  .  diltiazem (CARDIZEM) 30 MG tablet, Take 1 tablet every 4 hours AS NEEDED for AFIB heart rate >100, Disp: 45 tablet, Rfl: 1 .  lamoTRIgine (LAMICTAL) 150 MG tablet, TAKE 2 TABLETS BY MOUTH EVERY DAY AT BEDTIME, Disp: 180 tablet, Rfl: 2 .  lisinopril-hydrochlorothiazide (ZESTORETIC) 20-25 MG tablet, TAKE 1/2 TABLET BY MOUTH EVERY DAY, Disp: 45 tablet, Rfl: 1 .  Magnesium 500 MG TABS, Take 1 tablet by mouth at bedtime., Disp: , Rfl:  .  meclizine (ANTIVERT) 25 MG tablet, Take 1 tablet (25 mg total) by mouth 2 (two) times daily as needed for dizziness., Disp: 30 tablet, Rfl: 0 .  risperiDONE (RISPERDAL) 3 MG tablet, Take 1 tablet (3 mg total) by mouth at bedtime., Disp: 90 tablet, Rfl: 2 .  traZODone (DESYREL) 50 MG tablet, Take 1-2 tablets (50-100 mg total) by mouth at  bedtime as needed for sleep., Disp: 180 tablet, Rfl: 2  Current Facility-Administered Medications:  .  0.9 %  sodium chloride infusion, 500 mL, Intravenous, Once, Gatha Mayer, MD  EXAM:  VITALS per patient if applicable:  GENERAL: alert, oriented, appears well and in no acute distress  HEENT: atraumatic, conjunttiva clear, no obvious abnormalities on inspection of external nose and ears  NECK: normal movements of the head and neck  LUNGS: on inspection no signs of respiratory distress, breathing rate appears normal, no obvious gross SOB, gasping or wheezing  CV: no obvious cyanosis  MS: moves all visible extremities without noticeable abnormality  PSYCH/NEURO: pleasant and cooperative,  no obvious depression or anxiety, speech and thought processing grossly intact  ASSESSMENT AND PLAN:  Discussed the following assessment and plan:  Hearing loss of left ear, unspecified hearing loss type  Tinnitus of left ear  Vertigo  -we discussed possible serious and likely etiologies, options for evaluation and workup, limitations of telemedicine visit vs in person visit, treatment, treatment risks and precautions. Pt prefers to treat via telemedicine empirically rather then risking or undertaking an in person visit at this moment. Meniere's or vestibulitis high on differential, and placed urgent referral to ENT. Also, discussed more ominous potential etiologies (vascular, mass, etc), and options for further workup. She could go to the ER and certainly would advise if any worsening or not continuing to improve for imaging and neuro vs ENT consult. I will send message to schedulers to see if they can bump her MRI that Dr. Martinique ordered up in case she can not be seem promptly by ENT. No driving in interim. Patient agrees to seek prompt in person care if worsening, new symptoms arise, or if is not improving with treatment. She needs PCP and has appt scheduled in a few weeks for her TOC visit.   I discussed the assessment and treatment plan with the patient. The patient was provided an opportunity to ask questions and all were answered. The patient agreed with the plan and demonstrated an understanding of the instructions.   The patient was advised to call back or seek an in-person evaluation if the symptoms worsen or if the condition fails to improve as anticipated.   [name], DO

## 2020-03-09 NOTE — Patient Instructions (Signed)
Please call the Ear, Nose and Throat offices as we discussed. I also sent an urgent referral.  I sent a message to schedulers to see if they can bump your MRI up to a sooner date.  Do not drive with vertigo.  Seek emergency care if any worsening, new symptoms or severe symptoms or you are not continuing to improve.  Keep your scheduled appointment with Dr. Jerilee Hoh.

## 2020-03-17 ENCOUNTER — Telehealth: Payer: Self-pay | Admitting: Physician Assistant

## 2020-03-17 NOTE — Telephone Encounter (Signed)
Margaret Mckenzie called to report that she is not taking the Buspar.  She has vertigo and the Buspar makes it worse.  Should she try something else or what advise do you have.  Her next appt is 5/19

## 2020-03-18 NOTE — Telephone Encounter (Signed)
Patient called back and said that the xanax is helping

## 2020-03-18 NOTE — Telephone Encounter (Signed)
I'd like to see her before adding any other med. Has the increased Xanax helped?  If so, keep it up, but if not, make an appt w/ me sooner.

## 2020-03-18 NOTE — Telephone Encounter (Signed)
Left message for her to call back

## 2020-03-18 NOTE — Telephone Encounter (Signed)
Noted thank you she can continue until follow up

## 2020-03-26 ENCOUNTER — Other Ambulatory Visit: Payer: Self-pay

## 2020-03-26 ENCOUNTER — Ambulatory Visit
Admission: RE | Admit: 2020-03-26 | Discharge: 2020-03-26 | Disposition: A | Discharge status: Home / Self Care | Payer: PPO | Source: Ambulatory Visit | Attending: Family Medicine | Admitting: Family Medicine

## 2020-03-26 ENCOUNTER — Other Ambulatory Visit: Payer: Self-pay | Admitting: Physician Assistant

## 2020-03-26 DIAGNOSIS — R251 Tremor, unspecified: Secondary | ICD-10-CM

## 2020-03-26 DIAGNOSIS — R42 Dizziness and giddiness: Secondary | ICD-10-CM | POA: Diagnosis not present

## 2020-03-26 MED ORDER — GADOBENATE DIMEGLUMINE 529 MG/ML IV SOLN
13.0000 mL | Freq: Once | INTRAVENOUS | Status: AC | PRN
  Administered 2020-03-26: 13 mL via INTRAVENOUS

## 2020-03-31 ENCOUNTER — Other Ambulatory Visit: Payer: Self-pay

## 2020-04-01 ENCOUNTER — Encounter: Payer: Self-pay | Admitting: Internal Medicine

## 2020-04-01 ENCOUNTER — Ambulatory Visit (INDEPENDENT_AMBULATORY_CARE_PROVIDER_SITE_OTHER): Payer: PPO | Admitting: Internal Medicine

## 2020-04-01 VITALS — BP 130/83 | HR 74 | Temp 97.4°F | Ht 64.0 in | Wt 140.5 lb

## 2020-04-01 DIAGNOSIS — I10 Essential (primary) hypertension: Secondary | ICD-10-CM | POA: Diagnosis not present

## 2020-04-01 DIAGNOSIS — I480 Paroxysmal atrial fibrillation: Secondary | ICD-10-CM | POA: Diagnosis not present

## 2020-04-01 DIAGNOSIS — R42 Dizziness and giddiness: Secondary | ICD-10-CM

## 2020-04-01 DIAGNOSIS — F319 Bipolar disorder, unspecified: Secondary | ICD-10-CM | POA: Diagnosis not present

## 2020-04-01 NOTE — Progress Notes (Signed)
Established Patient Office Visit     This visit occurred during the SARS-CoV-2 public health emergency.  Safety protocols were in place, including screening questions prior to the visit, additional usage of staff PPE, and extensive cleaning of exam room while observing appropriate contact time as indicated for disinfecting solutions.    CC/Reason for Visit: Establish care, discuss chronic conditions  HPI: Margaret Mckenzie is a 69 y.o. female who is coming in today for the above mentioned reasons. Past Medical History is significant for: Paroxysmal atrial fibrillation followed by Dr. Percival Spanish anticoagulated on Eliquis and on as needed diltiazem, history of hypertension followed by cardiology and history of bipolar disorder followed by psychiatry on multiple psychotropic medications.  She has been dealing with some dizziness, she had a negative MRI, meclizine only helps intermittently.  She has a follow-up scheduled with ENT soon.  She has had both of her Covid vaccines.  She was a previous smoker but quit 3 years ago, used to smoke 1.5 packs/day, she drinks 2 glasses of wine every night.  She has no known drug allergies no prior surgical history.   Past Medical/Surgical History: Past Medical History:  Diagnosis Date  . Bipolar 1 disorder ([place])   . Chicken pox   . Hypertension    high blood pressure readings   . Kidney disease    kidney stones and saw urologist, Dr. Edward Qualia for lithotripsy remotely   . PAF (paroxysmal atrial fibrillation) ([place])     Past Surgical History:  Procedure Laterality Date  . BUNIONECTOMY  2007  . TONSILLECTOMY AND ADENOIDECTOMY  1975    Social History:  reports that she quit smoking about 2 years ago. Her smoking use included cigarettes. She smoked 0.50 packs per day. She has never used smokeless tobacco. She reports previous alcohol use. She reports that she does not use drugs.  Allergies: No Known Allergies  Family History:  Family  History  Problem Relation Age of Onset  . Osteoporosis Mother   . Stroke Mother        TIA, [place]  . Mental retardation Mother   . Hypertension Father   . Heart disease Father 29  . Stroke Father 64  . Mental illness Sister   . Breast cancer Neg Hx      Current Outpatient Medications:  .  ALPRAZolam (XANAX) 0.5 MG tablet, Take 1 tablet (0.5 mg total) by mouth 3 (three) times daily as needed for anxiety., Disp: 90 tablet, Rfl: 1 .  apixaban (ELIQUIS) 5 MG TABS tablet, Take 1 tablet (5 mg total) by mouth 2 (two) times daily., Disp: 180 tablet, Rfl: 3 .  Ascorbic Acid (VITAMIN C) 1000 MG tablet, Take 1,000 mg by mouth daily., Disp: , Rfl:  .  Calcium Carbonate-Vit D-Min (CALCIUM 1200 PO), Take by mouth., Disp: , Rfl:  .  cholecalciferol (VITAMIN D) 1000 units tablet, Take 1,000 Units by mouth daily., Disp: , Rfl:  .  diltiazem (CARDIZEM) 30 MG tablet, Take 1 tablet every 4 hours AS NEEDED for AFIB heart rate >100, Disp: 45 tablet, Rfl: 1 .  lamoTRIgine (LAMICTAL) 150 MG tablet, TAKE 2 TABLETS BY MOUTH EVERY DAY AT BEDTIME, Disp: 180 tablet, Rfl: 2 .  lisinopril-hydrochlorothiazide (ZESTORETIC) 20-25 MG tablet, TAKE 1/2 TABLET BY MOUTH EVERY DAY, Disp: 45 tablet, Rfl: 1 .  Magnesium 500 MG TABS, Take 1 tablet by mouth at bedtime., Disp: , Rfl:  .  risperiDONE (RISPERDAL) 3 MG tablet, Take 1 tablet (3 mg total)  by mouth at bedtime., Disp: 90 tablet, Rfl: 2 .  traZODone (DESYREL) 50 MG tablet, Take 1-2 tablets (50-100 mg total) by mouth at bedtime as needed for sleep., Disp: 180 tablet, Rfl: 2 .  busPIRone (BUSPAR) 15 MG tablet, TAKE 1/3 TABLET BY MOUTH TWICE A DAY FOR 1 WEEK, THEN 2/3 TWICE A DAY FOR 1 WEEK, THEN 1 TABLET DAILY THEREAFTER (Patient not taking: Reported on 04/01/2020), Disp: 180 tablet, Rfl: 0  Current Facility-Administered Medications:  .  0.9 %  sodium chloride infusion, 500 mL, Intravenous, Once, Gatha Mayer, MD  Review of Systems:  Constitutional: Denies fever, chills,  diaphoresis, appetite change and fatigue.  HEENT: Denies photophobia, eye pain, redness, hearing loss, ear pain, congestion, sore throat, rhinorrhea, sneezing, mouth sores, trouble swallowing, neck pain, neck stiffness and tinnitus.   Respiratory: Denies SOB, DOE, cough, chest tightness,  and wheezing.   Cardiovascular: Denies chest pain, palpitations and leg swelling.  Gastrointestinal: Denies nausea, vomiting, abdominal pain, diarrhea, constipation, blood in stool and abdominal distention.  Genitourinary: Denies dysuria, urgency, frequency, hematuria, flank pain and difficulty urinating.  Endocrine: Denies: hot or cold intolerance, sweats, changes in hair or nails, polyuria, polydipsia. Musculoskeletal: Denies myalgias, back pain, joint swelling, arthralgias and gait problem.  Skin: Denies pallor, rash and wound.  Neurological: Denies dizziness, seizures, syncope, weakness, light-headedness, numbness and headaches.  Hematological: Denies adenopathy. Easy bruising, personal or family bleeding history  Psychiatric/Behavioral: Denies suicidal ideation, mood changes, confusion, nervousness, sleep disturbance and agitation    Physical Exam: Vitals:   04/01/20 1458  BP: 130/83  Pulse: 74  Temp: (!) 97.4 F (36.3 C)  TempSrc: Temporal  SpO2: 97%  Weight: 140 lb 8 oz (63.7 kg)  Height: 5\' 4"  (1.626 m)    Body mass index is 24.12 kg/m.   Constitutional: NAD, calm, comfortable Eyes: PERRL, lids and conjunctivae normal, wears corrective lenses ENMT: Mucous membranes are moist. Respiratory: clear to auscultation bilaterally, no wheezing, no crackles. Normal respiratory effort. No accessory muscle use.  Cardiovascular: Regular rate and rhythm, no murmurs / rubs / gallops. No extremity edema. Neurologic: Grossly intact and nonfocal Psychiatric: Normal judgment and insight. Alert and oriented x 3. Normal mood.    Impression and Plan:  Paroxysmal atrial fibrillation ([facility]) -Appears to be  in sinus rhythm today, followed by cardiology.  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 -She has follow-up with ENT soon. -I believe she would benefit from vestibular PT, will order today.    Patient Instructions  -Nice seeing you today!!  -Schedule follow up at your convenience for a physical. Come in fasting that day.  -We will schedule you for vestibular PT.     [name], MD [facility]

## 2020-04-01 NOTE — Patient Instructions (Signed)
-  Nice seeing you today!!  -Schedule follow up at your convenience for a physical. Come in fasting that day.  -We will schedule you for vestibular PT.

## 2020-04-12 DIAGNOSIS — H8302 Labyrinthitis, left ear: Secondary | ICD-10-CM | POA: Diagnosis not present

## 2020-04-12 DIAGNOSIS — H9120 Sudden idiopathic hearing loss, unspecified ear: Secondary | ICD-10-CM | POA: Diagnosis not present

## 2020-04-12 DIAGNOSIS — H9042 Sensorineural hearing loss, unilateral, left ear, with unrestricted hearing on the contralateral side: Secondary | ICD-10-CM | POA: Diagnosis not present

## 2020-04-12 DIAGNOSIS — R42 Dizziness and giddiness: Secondary | ICD-10-CM | POA: Diagnosis not present

## 2020-04-14 ENCOUNTER — Telehealth: Payer: PPO | Admitting: Physician Assistant

## 2020-04-19 DIAGNOSIS — R42 Dizziness and giddiness: Secondary | ICD-10-CM | POA: Diagnosis not present

## 2020-04-19 DIAGNOSIS — H8302 Labyrinthitis, left ear: Secondary | ICD-10-CM | POA: Diagnosis not present

## 2020-04-22 ENCOUNTER — Other Ambulatory Visit: Payer: Self-pay

## 2020-04-23 ENCOUNTER — Encounter: Payer: Self-pay | Admitting: Internal Medicine

## 2020-04-23 ENCOUNTER — Ambulatory Visit (INDEPENDENT_AMBULATORY_CARE_PROVIDER_SITE_OTHER): Payer: PPO | Admitting: Internal Medicine

## 2020-04-23 VITALS — BP 110/70 | HR 85 | Temp 97.3°F | Ht 64.5 in | Wt 141.4 lb

## 2020-04-23 DIAGNOSIS — E538 Deficiency of other specified B group vitamins: Secondary | ICD-10-CM | POA: Insufficient documentation

## 2020-04-23 DIAGNOSIS — I480 Paroxysmal atrial fibrillation: Secondary | ICD-10-CM

## 2020-04-23 DIAGNOSIS — Z0000 Encounter for general adult medical examination without abnormal findings: Secondary | ICD-10-CM | POA: Diagnosis not present

## 2020-04-23 DIAGNOSIS — I10 Essential (primary) hypertension: Secondary | ICD-10-CM

## 2020-04-23 DIAGNOSIS — Z1239 Encounter for other screening for malignant neoplasm of breast: Secondary | ICD-10-CM | POA: Diagnosis not present

## 2020-04-23 DIAGNOSIS — H8309 Labyrinthitis, unspecified ear: Secondary | ICD-10-CM

## 2020-04-23 LAB — COMPREHENSIVE METABOLIC PANEL
ALT: 14 U/L (ref 0–35)
AST: 14 U/L (ref 0–37)
Albumin: 4.4 g/dL (ref 3.5–5.2)
Alkaline Phosphatase: 45 U/L (ref 39–117)
BUN: 11 mg/dL (ref 6–23)
CO2: 32 mEq/L (ref 19–32)
Calcium: 9.8 mg/dL (ref 8.4–10.5)
Chloride: 92 mEq/L — ABNORMAL LOW (ref 96–112)
Creatinine, Ser: 0.76 mg/dL (ref 0.40–1.20)
GFR: 75.69 mL/min (ref >60.00)
Glucose, Bld: 79 mg/dL (ref 70–99)
Potassium: 3.9 mEq/L (ref 3.5–5.1)
Sodium: 131 mEq/L — ABNORMAL LOW (ref 135–145)
Total Bilirubin: 0.7 mg/dL (ref 0.2–1.2)
Total Protein: 6.6 g/dL (ref 6.0–8.3)

## 2020-04-23 LAB — CBC WITH DIFFERENTIAL/PLATELET
Basophils Absolute: 0 10*3/uL (ref 0.0–0.1)
Basophils Relative: 0.6 % (ref 0.0–3.0)
Eosinophils Absolute: 0.1 10*3/uL (ref 0.0–0.7)
Eosinophils Relative: 1.3 % (ref 0.0–5.0)
HCT: 37.1 % (ref 36.0–46.0)
Hemoglobin: 12.4 g/dL (ref 12.0–15.0)
Lymphocytes Relative: 37.4 % (ref 12.0–46.0)
Lymphs Abs: 2.7 10*3/uL (ref 0.7–4.0)
MCHC: 33.3 g/dL (ref 30.0–36.0)
MCV: 90.9 fl (ref 78.0–100.0)
Monocytes Absolute: 0.6 10*3/uL (ref 0.1–1.0)
Monocytes Relative: 8.5 % (ref 3.0–12.0)
Neutro Abs: 3.8 10*3/uL (ref 1.4–7.7)
Neutrophils Relative %: 52.2 % (ref 43.0–77.0)
Platelets: 284 10*3/uL (ref 150.0–400.0)
RBC: 4.08 Mil/uL (ref 3.87–5.11)
RDW: 13.4 % (ref 11.5–15.5)
WBC: 7.3 10*3/uL (ref 4.0–10.5)

## 2020-04-23 LAB — LIPID PANEL
Cholesterol: 279 mg/dL — ABNORMAL HIGH (ref 0–200)
HDL: 165.1 mg/dL (ref >39.00)
LDL Cholesterol: 102 mg/dL — ABNORMAL HIGH (ref 0–99)
NonHDL: 113.95
Total CHOL/HDL Ratio: 2
Triglycerides: 62 mg/dL (ref 0.0–149.0)
VLDL: 12.4 mg/dL (ref 0.0–40.0)

## 2020-04-23 LAB — HEMOGLOBIN A1C: Hgb A1c MFr Bld: 5.2 % (ref 4.6–6.5)

## 2020-04-23 LAB — TSH: TSH: 1.67 u[IU]/mL (ref 0.35–4.50)

## 2020-04-23 LAB — VITAMIN B12: Vitamin B-12: 167 pg/mL — ABNORMAL LOW (ref 211–911)

## 2020-04-23 LAB — VITAMIN D 25 HYDROXY (VIT D DEFICIENCY, FRACTURES): VITD: 54.57 ng/mL (ref 30.00–100.00)

## 2020-04-23 NOTE — Patient Instructions (Signed)
-Nice seeing you today!!  -Lab work today; will notify you once results are available.  -Mammogram and DEXA scan will be requested today.  -See you back in 6 months.   Preventive Care 68 Years and Older, Female Preventive care refers to lifestyle choices and visits with your health care provider that can promote health and wellness. This includes:  A yearly physical exam. This is also called an annual well check.  Regular dental and eye exams.  Immunizations.  Screening for certain conditions.  Healthy lifestyle choices, such as diet and exercise. What can I expect for my preventive care visit? Physical exam Your health care provider will check:  Height and weight. These may be used to calculate body mass index (BMI), which is a measurement that tells if you are at a healthy weight.  Heart rate and blood pressure.  Your skin for abnormal spots. Counseling Your health care provider may ask you questions about:  Alcohol, tobacco, and drug use.  Emotional well-being.  Home and relationship well-being.  Sexual activity.  Eating habits.  History of falls.  Memory and ability to understand (cognition).  Work and work Statistician.  Pregnancy and menstrual history. What immunizations do I need?  Influenza (flu) vaccine  This is recommended every year. Tetanus, diphtheria, and pertussis (Tdap) vaccine  You may need a Td booster every 10 years. Varicella (chickenpox) vaccine  You may need this vaccine if you have not already been vaccinated. Zoster (shingles) vaccine  You may need this after age 70. Pneumococcal conjugate (PCV13) vaccine  One dose is recommended after age 68. Pneumococcal polysaccharide (PPSV23) vaccine  One dose is recommended after age 64. Measles, mumps, and rubella (MMR) vaccine  You may need at least one dose of MMR if you were born in 1957 or later. You may also need a second dose. Meningococcal conjugate (MenACWY) vaccine  You  may need this if you have certain conditions. Hepatitis A vaccine  You may need this if you have certain conditions or if you travel or work in places where you may be exposed to hepatitis A. Hepatitis B vaccine  You may need this if you have certain conditions or if you travel or work in places where you may be exposed to hepatitis B. Haemophilus influenzae type b (Hib) vaccine  You may need this if you have certain conditions. You may receive vaccines as individual doses or as more than one vaccine together in one shot (combination vaccines). Talk with your health care provider about the risks and benefits of combination vaccines. What tests do I need? Blood tests  Lipid and cholesterol levels. These may be checked every 5 years, or more frequently depending on your overall health.  Hepatitis C test.  Hepatitis B test. Screening  Lung cancer screening. You may have this screening every year starting at age 68 if you have a 30-pack-year history of smoking and currently smoke or have quit within the past 15 years.  Colorectal cancer screening. All adults should have this screening starting at age 68 and continuing until age 3. Your health care provider may recommend screening at age 68 if you are at increased risk. You will have tests every 1-10 years, depending on your results and the type of screening test.  Diabetes screening. This is done by checking your blood sugar (glucose) after you have not eaten for a while (fasting). You may have this done every 1-3 years.  Mammogram. This may be done every 1-2 years. Talk with  your health care provider about how often you should have regular mammograms.  BRCA-related cancer screening. This may be done if you have a family history of breast, ovarian, tubal, or peritoneal cancers. Other tests  Sexually transmitted disease (STD) testing.  Bone density scan. This is done to screen for osteoporosis. You may have this done starting at age  68. Follow these instructions at home: Eating and drinking  Eat a diet that includes fresh fruits and vegetables, whole grains, lean protein, and low-fat dairy products. Limit your intake of foods with high amounts of sugar, saturated fats, and salt.  Take vitamin and mineral supplements as recommended by your health care provider.  Do not drink alcohol if your health care provider tells you not to drink.  If you drink alcohol: ? Limit how much you have to 0-1 drink a day. ? Be aware of how much alcohol is in your drink. In the [place] one drink equals one 12 oz bottle of beer (355 mL), one 5 oz glass of wine (148 mL), or one 1 oz glass of hard liquor (44 mL). Lifestyle  Take daily care of your teeth and gums.  Stay active. Exercise for at least 30 minutes on 5 or more days each week.  Do not use any products that contain nicotine or tobacco, such as cigarettes, e-cigarettes, and chewing tobacco. If you need help quitting, ask your health care provider.  If you are sexually active, practice safe sex. Use a condom or other form of protection in order to prevent STIs (sexually transmitted infections).  Talk with your health care provider about taking a low-dose aspirin or statin. What's next?  Go to your health care provider once a year for a well check visit.  Ask your health care provider how often you should have your eyes and teeth checked.  Stay up to date on all vaccines. This information is not intended to replace advice given to you by your health care provider. Make sure you discuss any questions you have with your health care provider. Document Revised: 11/07/2018 Document Reviewed: 11/07/2018 Elsevier Patient Education  2020 [employer].

## 2020-04-23 NOTE — Progress Notes (Signed)
Established Patient Office Visit     This visit occurred during the SARS-CoV-2 public health emergency.  Safety protocols were in place, including screening questions prior to the visit, additional usage of staff PPE, and extensive cleaning of exam room while observing appropriate contact time as indicated for disinfecting solutions.    CC/Reason for Visit: Annual preventive exam and subsequent Medicare wellness visit  HPI: Margaret Mckenzie is a 69 y.o. female who is coming in today for the above mentioned reasons. Past Medical History is significant for: Paroxysmal atrial fibrillation followed by cardiology anticoagulated on Eliquis and on diltiazem for rate controlled, she also has a history of hypertension that has been well controlled and a history of bipolar disorder followed by psychiatry on multiple psychotropic medications.  She is doing well and has no acute complaints today.  Since I last saw her she has been dealing with some dizziness, saw ENT and was diagnosed with labyrinthitis and is currently on a steroid taper and undergoing vestibular therapy.  She has routine eye and dental care, she walks every day.  She is overdue for mammogram and DEXA scan, she had a colonoscopy in 2019.  She had a Pap smear in 2019.   Past Medical/Surgical History: Past Medical History:  Diagnosis Date  . Bipolar 1 disorder ([place])   . Chicken pox   . Hypertension    high blood pressure readings   . Kidney disease    kidney stones and saw urologist, Dr. Edward Qualia for lithotripsy remotely   . PAF (paroxysmal atrial fibrillation) ([place])     Past Surgical History:  Procedure Laterality Date  . BUNIONECTOMY  2007  . TONSILLECTOMY AND ADENOIDECTOMY  1975    Social History:  reports that she quit smoking about 2 years ago. Her smoking use included cigarettes. She smoked 0.50 packs per day. She has never used smokeless tobacco. She reports previous alcohol use. She reports that she does not  use drugs.  Allergies: No Known Allergies  Family History:  Family History  Problem Relation Age of Onset  . Osteoporosis Mother   . Stroke Mother        TIA, [place]  . Mental retardation Mother   . Hypertension Father   . Heart disease Father 37  . Stroke Father 61  . Mental illness Sister   . Breast cancer Neg Hx      Current Outpatient Medications:  .  ALPRAZolam (XANAX) 0.5 MG tablet, Take 1 tablet (0.5 mg total) by mouth 3 (three) times daily as needed for anxiety., Disp: 90 tablet, Rfl: 1 .  apixaban (ELIQUIS) 5 MG TABS tablet, Take 1 tablet (5 mg total) by mouth 2 (two) times daily., Disp: 180 tablet, Rfl: 3 .  Ascorbic Acid (VITAMIN C) 1000 MG tablet, Take 1,000 mg by mouth daily., Disp: , Rfl:  .  busPIRone (BUSPAR) 15 MG tablet, TAKE 1/3 TABLET BY MOUTH TWICE A DAY FOR 1 WEEK, THEN 2/3 TWICE A DAY FOR 1 WEEK, THEN 1 TABLET DAILY THEREAFTER, Disp: 180 tablet, Rfl: 0 .  Calcium Carbonate-Vit D-Min (CALCIUM 1200 PO), Take by mouth., Disp: , Rfl:  .  cholecalciferol (VITAMIN D) 1000 units tablet, Take 1,000 Units by mouth daily., Disp: , Rfl:  .  diltiazem (CARDIZEM) 30 MG tablet, Take 1 tablet every 4 hours AS NEEDED for AFIB heart rate >100, Disp: 45 tablet, Rfl: 1 .  lamoTRIgine (LAMICTAL) 150 MG tablet, TAKE 2 TABLETS BY MOUTH EVERY DAY AT BEDTIME, Disp:  180 tablet, Rfl: 2 .  lisinopril-hydrochlorothiazide (ZESTORETIC) 20-25 MG tablet, TAKE 1/2 TABLET BY MOUTH EVERY DAY, Disp: 45 tablet, Rfl: 1 .  Magnesium 500 MG TABS, Take 1 tablet by mouth at bedtime., Disp: , Rfl:  .  risperiDONE (RISPERDAL) 3 MG tablet, Take 1 tablet (3 mg total) by mouth at bedtime., Disp: 90 tablet, Rfl: 2 .  traZODone (DESYREL) 50 MG tablet, Take 1-2 tablets (50-100 mg total) by mouth at bedtime as needed for sleep., Disp: 180 tablet, Rfl: 2 .  predniSONE (DELTASONE) 10 MG tablet, Take 40 mg by mouth daily., Disp: , Rfl:   Current Facility-Administered Medications:  .  0.9 %  sodium chloride  infusion, 500 mL, Intravenous, Once, Gatha Mayer, MD  Review of Systems:  Constitutional: Denies fever, chills, diaphoresis, appetite change and fatigue.  HEENT: Denies photophobia, eye pain, redness, hearing loss, ear pain, congestion, sore throat, rhinorrhea, sneezing, mouth sores, trouble swallowing, neck pain, neck stiffness and tinnitus.   Respiratory: Denies SOB, DOE, cough, chest tightness,  and wheezing.   Cardiovascular: Denies chest pain, palpitations and leg swelling.  Gastrointestinal: Denies nausea, vomiting, abdominal pain, diarrhea, constipation, blood in stool and abdominal distention.  Genitourinary: Denies dysuria, urgency, frequency, hematuria, flank pain and difficulty urinating.  Endocrine: Denies: hot or cold intolerance, sweats, changes in hair or nails, polyuria, polydipsia. Musculoskeletal: Denies myalgias, back pain, joint swelling, arthralgias and gait problem.  Skin: Denies pallor, rash and wound.  Neurological: Denies dizziness, seizures, syncope, weakness, light-headedness, numbness and headaches.  Hematological: Denies adenopathy. Easy bruising, personal or family bleeding history  Psychiatric/Behavioral: Denies suicidal ideation, mood changes, confusion, nervousness, sleep disturbance and agitation    Physical Exam: Vitals:   04/23/20 0758  BP: 110/70  Pulse: 85  Temp: (!) 97.3 F (36.3 C)  TempSrc: Temporal  SpO2: 97%  Weight: 141 lb 6.4 oz (64.1 kg)  Height: 5' 4.5" (1.638 m)    Body mass index is 23.9 kg/m.   Constitutional: NAD, calm, comfortable Eyes: PERRL, lids and conjunctivae normal, wears corrective lenses ENMT: Mucous membranes are moist.Tympanic membrane is pearly white, no erythema or bulging. Neck: normal, supple, no masses, no thyromegaly Respiratory: clear to auscultation bilaterally, no wheezing, no crackles. Normal respiratory effort. No accessory muscle use.  Cardiovascular: Regular rate and rhythm, no murmurs / rubs /  gallops. No extremity edema. 2+ pedal pulses. No carotid bruits.  Abdomen: no tenderness, no masses palpated. No hepatosplenomegaly. Bowel sounds positive.  Musculoskeletal: no clubbing / cyanosis. No joint deformity upper and lower extremities. Good ROM, no contractures. Normal muscle tone.  Skin: no rashes, lesions, ulcers. No induration Neurologic: CN 2-12 grossly intact. Sensation intact, DTR normal. Strength 5/5 in all 4.  Psychiatric: Normal judgment and insight. Alert and oriented x 3. Normal mood.    Subsequent Medicare wellness visit   1. Risk factors, based on past  M,S,F -cardiovascular disease risk factors include age, history of hypertension   2.  Physical activities: She walks for 30 minutes every day   3.  Depression/mood:  Stable, not depressed   4.  Hearing:  No perceived issues   5.  ADL's: Independent in all ADLs   6.  Fall risk:  Low fall risk   7.  Home safety: No problems identified   8.  Height weight, and visual acuity: Height and weight as above, visual acuity is 20/32 with the right eye, 20/25 with the left eye, 20/25 with eyes together   9.  Counseling:  Advised to  obtain mammogram and DEXA scan   10. Lab orders based on risk factors: Laboratory update will be reviewed   11. Referral :  None today   12. Care plan:  Follow-up with me in 6 months   13. Cognitive assessment:  No cognitive impairment   14. Screening: Patient provided with a written and personalized 5-10 year screening schedule in the AVS.   he has   15. Provider List Update:   PCP, cardiology, psychiatry  16. Advance Directives: Full code     Office Visit from 04/23/2020 in [place] at [facility]  PHQ-9 Total Score  0      Fall Risk  04/01/2020 09/10/2018 09/03/2017 08/04/2013  Falls in the past year? 0 No No No  Number falls in past yr: 0 - - -  Injury with Fall? 0 - - -     Impression and Plan:  Encounter for preventive health examination  -She has routine eye and  dental care. -Immunizations are up-to-date including Covid and shingles. -Screening labs today. -Healthy lifestyle discussed in detail. -She had a colonoscopy in 2019 and is a 10-year callback. -Her last mammogram was in 2019, we have ordered another one for this year. -She had a normal Pap smear in 2019, repeat in 2022. -DEXA scan today.  Screening breast examination  - Plan: MM Digital Screening  Essential hypertension -Well-controlled on current regimen.  Paroxysmal atrial fibrillation (HCC) -Rate controlled, anticoagulated on Eliquis, followed by cardiology.  Labyrinthitis, unspecified laterality -Currently under the care of ENT going through prednisone taper and vestibular therapy.   Patient Instructions  -Nice seeing you today!!  -Lab work today; will notify you once results are available.  -Mammogram and DEXA scan will be requested today.  -See you back in 6 months.   Preventive Care 58 Years and Older, Female Preventive care refers to lifestyle choices and visits with your health care provider that can promote health and wellness. This includes:  A yearly physical exam. This is also called an annual well check.  Regular dental and eye exams.  Immunizations.  Screening for certain conditions.  Healthy lifestyle choices, such as diet and exercise. What can I expect for my preventive care visit? Physical exam Your health care provider will check:  Height and weight. These may be used to calculate body mass index (BMI), which is a measurement that tells if you are at a healthy weight.  Heart rate and blood pressure.  Your skin for abnormal spots. Counseling Your health care provider may ask you questions about:  Alcohol, tobacco, and drug use.  Emotional well-being.  Home and relationship well-being.  Sexual activity.  Eating habits.  History of falls.  Memory and ability to understand (cognition).  Work and work Statistician.  Pregnancy and  menstrual history. What immunizations do I need?  Influenza (flu) vaccine  This is recommended every year. Tetanus, diphtheria, and pertussis (Tdap) vaccine  You may need a Td booster every 10 years. Varicella (chickenpox) vaccine  You may need this vaccine if you have not already been vaccinated. Zoster (shingles) vaccine  You may need this after age 59. Pneumococcal conjugate (PCV13) vaccine  One dose is recommended after age 52. Pneumococcal polysaccharide (PPSV23) vaccine  One dose is recommended after age 66. Measles, mumps, and rubella (MMR) vaccine  You may need at least one dose of MMR if you were born in 1957 or later. You may also need a second dose. Meningococcal conjugate (MenACWY) vaccine  You may need this  if you have certain conditions. Hepatitis A vaccine  You may need this if you have certain conditions or if you travel or work in places where you may be exposed to hepatitis A. Hepatitis B vaccine  You may need this if you have certain conditions or if you travel or work in places where you may be exposed to hepatitis B. Haemophilus influenzae type b (Hib) vaccine  You may need this if you have certain conditions. You may receive vaccines as individual doses or as more than one vaccine together in one shot (combination vaccines). Talk with your health care provider about the risks and benefits of combination vaccines. What tests do I need? Blood tests  Lipid and cholesterol levels. These may be checked every 5 years, or more frequently depending on your overall health.  Hepatitis C test.  Hepatitis B test. Screening  Lung cancer screening. You may have this screening every year starting at age 13 if you have a 30-pack-year history of smoking and currently smoke or have quit within the past 15 years.  Colorectal cancer screening. All adults should have this screening starting at age 77 and continuing until age 107. Your health care provider may  recommend screening at age 45 if you are at increased risk. You will have tests every 1-10 years, depending on your results and the type of screening test.  Diabetes screening. This is done by checking your blood sugar (glucose) after you have not eaten for a while (fasting). You may have this done every 1-3 years.  Mammogram. This may be done every 1-2 years. Talk with your health care provider about how often you should have regular mammograms.  BRCA-related cancer screening. This may be done if you have a family history of breast, ovarian, tubal, or peritoneal cancers. Other tests  Sexually transmitted disease (STD) testing.  Bone density scan. This is done to screen for osteoporosis. You may have this done starting at age 47. Follow these instructions at home: Eating and drinking  Eat a diet that includes fresh fruits and vegetables, whole grains, lean protein, and low-fat dairy products. Limit your intake of foods with high amounts of sugar, saturated fats, and salt.  Take vitamin and mineral supplements as recommended by your health care provider.  Do not drink alcohol if your health care provider tells you not to drink.  If you drink alcohol: ? Limit how much you have to 0-1 drink a day. ? Be aware of how much alcohol is in your drink. In the [place] one drink equals one 12 oz bottle of beer (355 mL), one 5 oz glass of wine (148 mL), or one 1 oz glass of hard liquor (44 mL). Lifestyle  Take daily care of your teeth and gums.  Stay active. Exercise for at least 30 minutes on 5 or more days each week.  Do not use any products that contain nicotine or tobacco, such as cigarettes, e-cigarettes, and chewing tobacco. If you need help quitting, ask your health care provider.  If you are sexually active, practice safe sex. Use a condom or other form of protection in order to prevent STIs (sexually transmitted infections).  Talk with your health care provider about taking a low-dose  aspirin or statin. What's next?  Go to your health care provider once a year for a well check visit.  Ask your health care provider how often you should have your eyes and teeth checked.  Stay up to date on all vaccines. This information  is not intended to replace advice given to you by your health care provider. Make sure you discuss any questions you have with your health care provider. Document Revised: 11/07/2018 Document Reviewed: 11/07/2018 Elsevier Patient Education  2020 [place], MD [facility]

## 2020-04-27 MED ORDER — "BD ECLIPSE SYRINGE 25G X 5/8"" 3 ML MISC": 0 refills | Status: DC

## 2020-04-27 MED ORDER — CYANOCOBALAMIN 1000 MCG/ML IJ SOLN
INTRAMUSCULAR | 0 refills | Status: DC
Start: 2020-04-27 — End: 2020-05-19

## 2020-04-27 NOTE — Addendum Note (Signed)
Addended by: Agnes Lawrence on: 04/27/2020 08:18 AM   Modules accepted: Orders

## 2020-04-28 DIAGNOSIS — H8302 Labyrinthitis, left ear: Secondary | ICD-10-CM | POA: Diagnosis not present

## 2020-04-28 DIAGNOSIS — R42 Dizziness and giddiness: Secondary | ICD-10-CM | POA: Diagnosis not present

## 2020-05-03 DIAGNOSIS — R42 Dizziness and giddiness: Secondary | ICD-10-CM | POA: Diagnosis not present

## 2020-05-03 DIAGNOSIS — H8302 Labyrinthitis, left ear: Secondary | ICD-10-CM | POA: Diagnosis not present

## 2020-05-05 DIAGNOSIS — R42 Dizziness and giddiness: Secondary | ICD-10-CM | POA: Diagnosis not present

## 2020-05-05 DIAGNOSIS — H8302 Labyrinthitis, left ear: Secondary | ICD-10-CM | POA: Diagnosis not present

## 2020-05-06 ENCOUNTER — Telehealth (INDEPENDENT_AMBULATORY_CARE_PROVIDER_SITE_OTHER): Payer: PPO | Admitting: Physician Assistant

## 2020-05-06 ENCOUNTER — Encounter: Payer: Self-pay | Admitting: Physician Assistant

## 2020-05-06 DIAGNOSIS — F319 Bipolar disorder, unspecified: Secondary | ICD-10-CM

## 2020-05-06 DIAGNOSIS — F410 Panic disorder [episodic paroxysmal anxiety] without agoraphobia: Secondary | ICD-10-CM | POA: Diagnosis not present

## 2020-05-06 MED ORDER — ALPRAZOLAM 0.5 MG PO TABS: 0.5000 mg | ORAL_TABLET | Freq: Three times a day (TID) | ORAL | 2 refills | Status: DC | PRN

## 2020-05-06 NOTE — Progress Notes (Signed)
[facility] Med Check  Patient ID: Margaret Mckenzie,  MRN: [id number]  PCP: Isaac Bliss, Rayford Halsted, MD  Date of Evaluation: 05/06/2020 Time spent:20 minutes  Chief Complaint:  Chief Complaint    Anxiety     Virtual Visit via Telephone or Video Note  I connected with patient by a video enabled telemedicine application or telephone, with their informed consent, and verified patient privacy and that I am speaking with the correct person using two identifiers.  I am private, in my office and the patient is home work.  I discussed the limitations, risks, security and privacy concerns of performing an evaluation and management service by  video and the availability of in person appointments. I also discussed with the patient that there may be a patient responsible charge related to this service. The patient expressed understanding and agreed to proceed.   I discussed the assessment and treatment plan with the patient. The patient was provided an opportunity to ask questions and all were answered. The patient agreed with the plan and demonstrated an understanding of the instructions.   The patient was advised to call back or seek an in-person evaluation if the symptoms worsen or if the condition fails to improve as anticipated.  I provided 20  minutes of non-face-to-face time during this encounter.    HISTORY/CURRENT STATUS: HPI for routine med check after starting BuSpar.  Margaret Mckenzie states she is feeling much much better.  She is not nearly as anxious as she was.  And when she does have heightened anxiety is much more manageable now than it was.  The Xanax is still very helpful in those situations.  She has only been taking 1 pill of the BuSpar not 1 twice daily.  See review of systems below.  Being sick has been difficult on her.  She was unable to drive for a while.  She has had relearn how to walk.  She is still out of work.  She is in physical therapy 2 days a week and does  exercises at home every day.  "I am a lot better than I was."  Patient denies loss of interest in usual activities and is able to enjoy things.  Denies decreased energy or motivation.  Appetite has not changed.  No extreme sadness, tearfulness, or feelings of hopelessness.  Denies any changes in concentration, making decisions or remembering things.  Denies suicidal or homicidal thoughts.  Patient denies increased energy with decreased need for sleep, no increased talkativeness, no racing thoughts, no impulsivity or risky behaviors, no increased spending, no increased libido, no grandiosity, no increased irritability or anger, and no hallucinations.  Denies dizziness, syncope, seizures, numbness, tingling, tremor, tics, unsteady gait, slurred speech, confusion. Denies muscle or joint pain, stiffness, or dystonia.  Individual Medical History/ Review of Systems: Changes? :Yes acute labyrynthitis, out of work since 4//5/21 Is  Has new dx of B12 def.  Past medications for mental health diagnoses include: Depakote, Lamictal, Prozac, Lexapro, questionable Ambien, Xanax, Sonata, Risperdal  Allergies: Patient has no known allergies.  Current Medications:  Current Outpatient Medications:  .  ALPRAZolam (XANAX) 0.5 MG tablet, Take 1 tablet (0.5 mg total) by mouth 3 (three) times daily as needed for anxiety., Disp: 90 tablet, Rfl: 2 .  apixaban (ELIQUIS) 5 MG TABS tablet, Take 1 tablet (5 mg total) by mouth 2 (two) times daily., Disp: 180 tablet, Rfl: 3 .  Ascorbic Acid (VITAMIN C) 1000 MG tablet, Take 1,000 mg by mouth daily., Disp: , Rfl:  .  busPIRone (BUSPAR) 15 MG tablet, TAKE 1/3 TABLET BY MOUTH TWICE A DAY FOR 1 WEEK, THEN 2/3 TWICE A DAY FOR 1 WEEK, THEN 1 TABLET DAILY THEREAFTER (Patient taking differently: 15 mg daily. TAKE 1/3 TABLET BY MOUTH TWICE A DAY FOR 1 WEEK, THEN 2/3 TWICE A DAY FOR 1 WEEK, THEN 1 TABLET DAILY THEREAFTER), Disp: 180 tablet, Rfl: 0 .  Calcium Carbonate-Vit D-Min (CALCIUM  1200 PO), Take by mouth., Disp: , Rfl:  .  cholecalciferol (VITAMIN D) 1000 units tablet, Take 1,000 Units by mouth daily., Disp: , Rfl:  .  cyanocobalamin (,VITAMIN B-12,) 1000 MCG/ML injection, Inject 19ml intramuscularly once a week for 4 weeks, then once a month thereafter, Disp: 10 mL, Rfl: 0 .  diltiazem (CARDIZEM) 30 MG tablet, Take 1 tablet every 4 hours AS NEEDED for AFIB heart rate >100, Disp: 45 tablet, Rfl: 1 .  lamoTRIgine (LAMICTAL) 150 MG tablet, TAKE 2 TABLETS BY MOUTH EVERY DAY AT BEDTIME, Disp: 180 tablet, Rfl: 2 .  lisinopril-hydrochlorothiazide (ZESTORETIC) 20-25 MG tablet, TAKE 1/2 TABLET BY MOUTH EVERY DAY, Disp: 45 tablet, Rfl: 1 .  Magnesium 500 MG TABS, Take 1 tablet by mouth at bedtime., Disp: , Rfl:  .  risperiDONE (RISPERDAL) 3 MG tablet, Take 1 tablet (3 mg total) by mouth at bedtime., Disp: 90 tablet, Rfl: 2 .  SYRINGE-NEEDLE, DISP, 3 ML (BD ECLIPSE SYRINGE) 25G X 5/8" 3 ML MISC, Use as directed, Disp: 50 each, Rfl: 0 .  traZODone (DESYREL) 50 MG tablet, Take 1-2 tablets (50-100 mg total) by mouth at bedtime as needed for sleep., Disp: 180 tablet, Rfl: 2 .  predniSONE (DELTASONE) 10 MG tablet, Take 40 mg by mouth daily. (Patient not taking: Reported on 05/06/2020), Disp: , Rfl:   Current Facility-Administered Medications:  .  0.9 %  sodium chloride infusion, 500 mL, Intravenous, Once, Carlean Purl Ofilia Neas, MD Medication Side Effects: none  Family Medical/ Social History: Changes? No  MENTAL HEALTH EXAM:  There were no vitals taken for this visit.There is no height or weight on file to calculate BMI.  General Appearance: Casual, Neat and Well Groomed  Eye Contact:  Good  Speech:  Clear and Coherent and Normal Rate  Volume:  Normal  Mood:  Euthymic  Affect:  Appropriate  Thought Process:  Goal Directed and Descriptions of Associations: Intact  Orientation:  Full (Time, Place, and Person)  Thought Content: Logical   Suicidal Thoughts:  No  Homicidal Thoughts:  No   Memory:  WNL  Judgement:  Good  Insight:  Good  Psychomotor Activity:  From the shoulders up she appears normal  Concentration:  Concentration: Good  Recall:  Good  Fund of Knowledge: Good  Language: Good  Assets:  Desire for Improvement  ADL's:  Intact  Cognition: WNL  Prognosis:  Good   Pertinent labs reviewed.  04/23/2020 CBC with differential was normal.  CMP glucose was normal, hemoglobin A1c was 5.2, total cholesterol was 279, LDL was 102, the rest of the lipid panel was normal.  DIAGNOSES:    ICD-10-CM   1. Panic disorder  F41.0   2. Bipolar I disorder ([place])  F31.9     Receiving Psychotherapy: No    RECOMMENDATIONS:  PDMP reviewed. I am glad to see her doing so much better with the anxiety.  I am sorry she is having to deal with the labyrinthitis but I am glad she is improving with that as well. Continue BuSpar 15 mg, 1 p.o. daily.  Since she  is doing so well, there is no reason to increase the dose.  However, if the anxiety recurs, she can increase the dose to 1-1/3 pill daily for a week and then increase to 1-2/3 pill daily for a week and then increase to 1 p.o. twice daily.  She verbalizes understanding.  Continue Xanax to 0.5 mg, 1 p.o. 3 times daily as needed. Continue Lamictal 150 mg, 2 p.o. nightly. Continue Risperdal 3 mg, 1 p.o. nightly. Continue trazodone 50 mg, 1-2 nightly as needed sleep. Return in 3 months.  Donnal Moat, PA-C

## 2020-05-11 DIAGNOSIS — H8302 Labyrinthitis, left ear: Secondary | ICD-10-CM | POA: Diagnosis not present

## 2020-05-11 DIAGNOSIS — R42 Dizziness and giddiness: Secondary | ICD-10-CM | POA: Diagnosis not present

## 2020-05-13 DIAGNOSIS — R42 Dizziness and giddiness: Secondary | ICD-10-CM | POA: Diagnosis not present

## 2020-05-13 DIAGNOSIS — H8302 Labyrinthitis, left ear: Secondary | ICD-10-CM | POA: Diagnosis not present

## 2020-05-14 DIAGNOSIS — H9122 Sudden idiopathic hearing loss, left ear: Secondary | ICD-10-CM | POA: Diagnosis not present

## 2020-05-14 DIAGNOSIS — H8302 Labyrinthitis, left ear: Secondary | ICD-10-CM | POA: Diagnosis not present

## 2020-05-14 DIAGNOSIS — H9042 Sensorineural hearing loss, unilateral, left ear, with unrestricted hearing on the contralateral side: Secondary | ICD-10-CM | POA: Diagnosis not present

## 2020-05-14 DIAGNOSIS — R42 Dizziness and giddiness: Secondary | ICD-10-CM | POA: Diagnosis not present

## 2020-05-17 ENCOUNTER — Other Ambulatory Visit: Payer: Self-pay | Admitting: Internal Medicine

## 2020-05-17 DIAGNOSIS — Z1239 Encounter for other screening for malignant neoplasm of breast: Secondary | ICD-10-CM

## 2020-05-19 ENCOUNTER — Other Ambulatory Visit: Payer: Self-pay

## 2020-05-19 ENCOUNTER — Other Ambulatory Visit: Payer: Self-pay | Admitting: Internal Medicine

## 2020-05-19 ENCOUNTER — Ambulatory Visit
Admission: RE | Admit: 2020-05-19 | Discharge: 2020-05-19 | Disposition: A | Discharge status: Home / Self Care | Payer: PPO | Source: Ambulatory Visit | Attending: Internal Medicine | Admitting: Internal Medicine

## 2020-05-19 DIAGNOSIS — R42 Dizziness and giddiness: Secondary | ICD-10-CM | POA: Diagnosis not present

## 2020-05-19 DIAGNOSIS — Z1239 Encounter for other screening for malignant neoplasm of breast: Secondary | ICD-10-CM

## 2020-05-19 DIAGNOSIS — H8302 Labyrinthitis, left ear: Secondary | ICD-10-CM | POA: Diagnosis not present

## 2020-05-19 DIAGNOSIS — Z1231 Encounter for screening mammogram for malignant neoplasm of breast: Secondary | ICD-10-CM | POA: Diagnosis not present

## 2020-05-26 DIAGNOSIS — H8302 Labyrinthitis, left ear: Secondary | ICD-10-CM | POA: Diagnosis not present

## 2020-05-26 DIAGNOSIS — R42 Dizziness and giddiness: Secondary | ICD-10-CM | POA: Diagnosis not present

## 2020-05-28 DIAGNOSIS — H8302 Labyrinthitis, left ear: Secondary | ICD-10-CM | POA: Diagnosis not present

## 2020-05-28 DIAGNOSIS — R42 Dizziness and giddiness: Secondary | ICD-10-CM | POA: Diagnosis not present

## 2020-06-01 DIAGNOSIS — H8302 Labyrinthitis, left ear: Secondary | ICD-10-CM | POA: Diagnosis not present

## 2020-06-01 DIAGNOSIS — R42 Dizziness and giddiness: Secondary | ICD-10-CM | POA: Diagnosis not present

## 2020-06-03 DIAGNOSIS — H8302 Labyrinthitis, left ear: Secondary | ICD-10-CM | POA: Diagnosis not present

## 2020-06-03 DIAGNOSIS — R42 Dizziness and giddiness: Secondary | ICD-10-CM | POA: Diagnosis not present

## 2020-06-07 DIAGNOSIS — H8302 Labyrinthitis, left ear: Secondary | ICD-10-CM | POA: Diagnosis not present

## 2020-06-07 DIAGNOSIS — R42 Dizziness and giddiness: Secondary | ICD-10-CM | POA: Diagnosis not present

## 2020-06-15 DIAGNOSIS — H8302 Labyrinthitis, left ear: Secondary | ICD-10-CM | POA: Diagnosis not present

## 2020-06-15 DIAGNOSIS — R42 Dizziness and giddiness: Secondary | ICD-10-CM | POA: Diagnosis not present

## 2020-07-11 ENCOUNTER — Ambulatory Visit (HOSPITAL_COMMUNITY)
Admission: EM | Admit: 2020-07-11 | Discharge: 2020-07-11 | Disposition: A | Discharge status: Home / Self Care | Payer: PPO | Attending: Physician Assistant | Admitting: Physician Assistant

## 2020-07-11 ENCOUNTER — Encounter (HOSPITAL_COMMUNITY): Payer: Self-pay | Admitting: Emergency Medicine

## 2020-07-11 ENCOUNTER — Other Ambulatory Visit: Payer: Self-pay

## 2020-07-11 ENCOUNTER — Ambulatory Visit (INDEPENDENT_AMBULATORY_CARE_PROVIDER_SITE_OTHER): Payer: PPO

## 2020-07-11 DIAGNOSIS — S62311A Displaced fracture of base of second metacarpal bone. left hand, initial encounter for closed fracture: Secondary | ICD-10-CM | POA: Diagnosis not present

## 2020-07-11 DIAGNOSIS — S6722XA Crushing injury of left hand, initial encounter: Secondary | ICD-10-CM | POA: Diagnosis not present

## 2020-07-11 DIAGNOSIS — Z7901 Long term (current) use of anticoagulants: Secondary | ICD-10-CM | POA: Diagnosis not present

## 2020-07-11 DIAGNOSIS — S62331A Displaced fracture of neck of second metacarpal bone, left hand, initial encounter for closed fracture: Secondary | ICD-10-CM | POA: Diagnosis not present

## 2020-07-11 MED ORDER — ACETAMINOPHEN 500 MG PO TABS
1000.0000 mg | ORAL_TABLET | Freq: Three times a day (TID) | ORAL | 0 refills | Status: DC | PRN
Start: 2020-07-11 — End: 2021-07-16

## 2020-07-11 NOTE — Progress Notes (Signed)
Orthopedic Tech Progress Note Patient Details:  Christella App 1950/12/16 [id number]  Ortho Devices Type of Ortho Device: Volar splint, Arm sling Ortho Device/Splint Location: ULE Ortho Device/Splint Interventions: Application, Ordered   Post Interventions Patient Tolerated: Well Instructions Provided: Care of device, Poper ambulation with device   Jaevion Goto A Easten Maceachern 07/11/2020, 3:33 PM

## 2020-07-11 NOTE — ED Notes (Signed)
Notified ortho tech, coming

## 2020-07-11 NOTE — ED Provider Notes (Signed)
[place]    CSN: [id number] Arrival date & time: 07/11/20  1016      History   Chief Complaint Chief Complaint  Patient presents with   Hand Injury    HPI Margaret Mckenzie is a 69 y.o. female.   Patient reports for left hand injury.  Patient is on Eliquis therapy for approximately fibrillation.  She reports yesterday evening she slammed her hand in a car door.  She reports lots of pain and swelling since then.  She tried compression bandage and elevating the hand however there is been continued swelling.  She reports pain has been well controlled with Tylenol.  Reports she can move all her digits and feel them well.  Denies numbness or tingling.  Denies other injuries.     Past Medical History:  Diagnosis Date   Bipolar 1 disorder ([place])    Chicken pox    Hypertension    high blood pressure readings    Kidney disease    kidney stones and saw urologist, Dr. Edward Qualia for lithotripsy remotely    PAF (paroxysmal atrial fibrillation) [facility])     Patient Active Problem List   Diagnosis Date Noted   Vitamin B12 deficiency 04/23/2020   Paroxysmal atrial fibrillation ([place]) 08/31/2017   Essential hypertension 08/31/2017    Past Surgical History:  Procedure Laterality Date   BUNIONECTOMY  2007   TONSILLECTOMY AND ADENOIDECTOMY  1975    OB History   No obstetric history on file.      Home Medications    Prior to Admission medications   Medication Sig Start Date End Date Taking? Authorizing Provider  ALPRAZolam Duanne Moron) 0.5 MG tablet Take 1 tablet (0.5 mg total) by mouth 3 (three) times daily as needed for anxiety. 05/06/20  Yes Donnal Moat T, PA-C  apixaban (ELIQUIS) 5 MG TABS tablet Take 1 tablet (5 mg total) by mouth 2 (two) times daily. 08/29/19  Yes Hochrein, Jeneen Rinks, MD  busPIRone (BUSPAR) 15 MG tablet TAKE 1/3 TABLET BY MOUTH TWICE A DAY FOR 1 WEEK, THEN 2/3 TWICE A DAY FOR 1 WEEK, THEN 1 TABLET DAILY THEREAFTER Patient taking  differently: 15 mg daily. TAKE 1/3 TABLET BY MOUTH TWICE A DAY FOR 1 WEEK, THEN 2/3 TWICE A DAY FOR 1 WEEK, THEN 1 TABLET DAILY THEREAFTER 03/26/20  Yes Hurst, Teresa T, PA-C  Calcium Carbonate-Vit D-Min (CALCIUM 1200 PO) Take by mouth.   Yes [provider]  cholecalciferol (VITAMIN D) 1000 units tablet Take 1,000 Units by mouth daily.   Yes [provider]  diltiazem (CARDIZEM) 30 MG tablet Take 1 tablet every 4 hours AS NEEDED for AFIB heart rate >100 09/06/18  Yes Hochrein, Jeneen Rinks, MD  lamoTRIgine (LAMICTAL) 150 MG tablet TAKE 2 TABLETS BY MOUTH EVERY DAY AT BEDTIME 11/06/19  Yes Hurst, Teresa T, PA-C  lisinopril-hydrochlorothiazide (ZESTORETIC) 20-25 MG tablet TAKE 1/2 TABLET BY MOUTH EVERY DAY 01/20/20  Yes Minus Breeding, MD  Magnesium 500 MG TABS Take 1 tablet by mouth at bedtime.   Yes [provider]  risperiDONE (RISPERDAL) 3 MG tablet Take 1 tablet (3 mg total) by mouth at bedtime. 11/06/19  Yes Donnal Moat T, PA-C  traZODone (DESYREL) 50 MG tablet Take 1-2 tablets (50-100 mg total) by mouth at bedtime as needed for sleep. 11/06/19  Yes Hurst, Teresa T, PA-C  acetaminophen (TYLENOL) 500 MG tablet Take 2 tablets (1,000 mg total) by mouth every 8 (eight) hours as needed. 07/11/20   Bianca Raneri, Marguerita Beards, PA-C  Ascorbic Acid (VITAMIN C) 1000 MG tablet Take 1,000 mg by mouth daily.    [provider]  cyanocobalamin (,VITAMIN B-12,) 1000 MCG/ML injection Inject 1 mL (1,000 mcg total) into the muscle every 30 (thirty) days. 05/19/20   Isaac Bliss, Rayford Halsted, MD  predniSONE (DELTASONE) 10 MG tablet Take 40 mg by mouth daily. Patient not taking: Reported on 05/06/2020 04/12/20   [provider]  SYRINGE-NEEDLE, DISP, 3 ML (BD ECLIPSE SYRINGE) 25G X 5/8" 3 ML MISC Use as directed 04/27/20   Isaac Bliss, Rayford Halsted, MD    Family History Family History  Problem Relation Age of Onset   Osteoporosis Mother    Stroke Mother        TIA, stoke   Mental  retardation Mother    Hypertension Father    Heart disease Father 73   Stroke Father 35   Mental illness Sister    Breast cancer Neg Hx     Social History Social History   Tobacco Use   Smoking status: Former Smoker    Packs/day: 0.50    Types: Cigarettes    Quit date: 07/11/2017    Years since quitting: 3.0   Smokeless tobacco: Never Used   Tobacco comment: pt has quit smoking  Vaping Use   Vaping Use: Every day   Substances: Nicotine  Substance Use Topics   Alcohol use: Yes   Drug use: No     Allergies   Patient has no known allergies.   Review of Systems Review of Systems   Physical Exam Triage Vital Signs ED Triage Vitals  Enc Vitals Group     BP 07/11/20 1127 (!) 159/85     Pulse Rate 07/11/20 1127 71     Resp 07/11/20 1127 16     Temp 07/11/20 1127 97.8 F (36.6 C)     Temp Source 07/11/20 1127 Oral     SpO2 07/11/20 1127 98 %     Weight --      Height --      Head Circumference --      Peak Flow --      Pain Score 07/11/20 1122 8     Pain Loc --      Pain Edu? --      Excl. in [place]? --    No data found.  Updated Vital Signs BP (!) 159/85    Pulse 71    Temp 97.8 F (36.6 C) (Oral)    Resp 16    SpO2 98%   Visual Acuity Right Eye Distance:   Left Eye Distance:   Bilateral Distance:    Right Eye Near:   Left Eye Near:    Bilateral Near:     Physical Exam Vitals and nursing note reviewed.  Constitutional:      Appearance: Normal appearance.  Cardiovascular:     Rate and Rhythm: Normal rate and regular rhythm.  Musculoskeletal:     Comments: Left hand with significant dorsal and volar swelling.  Ecchymosis present.  Tenderness to palpation over the second metacarpal area.  Patient is able to move digits of the left hand without issue.  Cap refill less than 2 seconds, sensation intact in all digits.  Good radial pulse.  Neurological:     Mental Status: She is alert.          UC Treatments / Results  Labs (all labs  ordered are listed, but only abnormal results are displayed) Labs Reviewed - No data to  display  EKG   Radiology DG Hand Complete Left  Addendum Date: 07/11/2020   ADDENDUM REPORT: 07/11/2020 12:32 ADDENDUM: Addendum for correction of findings and impression. The comminuted impacted displaced fracture is of the distal second metacarpal. There is no fracture involving the first metacarpal. Electronically Signed   By: Lovey Newcomer M.D.   On: 07/11/2020 12:32   Result Date: 07/11/2020 CLINICAL DATA:  Slammed hand into car door. EXAM: LEFT HAND - COMPLETE 3+ VIEW COMPARISON:  None. FINDINGS: There is a comminuted impacted displaced fracture of the distal first metacarpal. No evidence for associated acute fractures. Marked swelling over the dorsum of the hand. IMPRESSION: Comminuted impacted displaced fracture of the distal first metacarpal. Electronically Signed: By: Lovey Newcomer M.D. On: 07/11/2020 12:09    Procedures Procedures (including critical care time)  Medications Ordered in UC Medications - No data to display  Initial Impression / Assessment and Plan / UC Course  I have reviewed the triage vital signs and the nursing notes.  Pertinent labs & imaging results that were available during my care of the patient were reviewed by me and considered in my medical decision making (see chart for details).     #Closed comminuted displaced fracture of the second metacarpal neck. Patient is a 69 year old on Eliquis therapy for approximately atrial fibrillation presenting with comminuted displaced fracture of the second metacarpal neck.  Neurovascularly intact.  Called and discussed with Dr. Doreatha Martin, recommend being seen in the next 1 to 2 days.  Also recommended stopping Eliquis.  Consulted with Dr. Harrell Gave the on-call cardiologist for patient's cardiology group [facility], she agrees it would be okay to hold Eliquis until she has orthopedic follow-up as patient has a very low risk of  stroke.  Will place patient in volar resting splint and sling per Dr. Doreatha Martin.  Will hold Eliquis.  Patient does not tolerate opiate pain medicines and states she is doing well with just Tylenol.  Will recommend continued elevation and icing.  Strict emergency department precautions were discussed.  Instructed patient to call Dr. Sherle Poe office in the morning.  Patient verbalized understanding plan of care. Final Clinical Impressions(s) / UC Diagnoses   Final diagnoses:  Closed displaced fracture of neck of second metacarpal bone of left hand, initial encounter     Discharge Instructions     I have spoken with Dr. Doreatha Martin about your hand, Call his office tomorrow and request to be seen tomorrow  I have also spoken with Dr. Harrell Gave the on call Cardiologist with your Cardiology group, she is OK with hold your Eliquis until you see the orthopedist - hold this  Take tylenol every 8 hours for pain  Wear the splint, keep the hand above your heart, apply ice over the top of splint  If you develop worsening swelling to the point you lose feeling in your fingers, you fingers turn completely white, have severely worsening pain, go to the Emergency Department        ED Prescriptions    Medication Sig Dispense Auth. Provider   acetaminophen (TYLENOL) 500 MG tablet Take 2 tablets (1,000 mg total) by mouth every 8 (eight) hours as needed. 30 tablet Aryaan Persichetti, Marguerita Beards, PA-C     PDMP not reviewed this encounter.   Purnell Shoemaker, PA-C 07/11/20 1316

## 2020-07-11 NOTE — ED Triage Notes (Addendum)
Pt is on blood thinners, ice bag given to pt.

## 2020-07-11 NOTE — ED Triage Notes (Signed)
Pt c/o of left hand pain after slamming it into the car door last night. Obvious swelling and bruising noted to left hand. Pt able to move all digits, and can feel this CMA touch her finger tips. Capillary refill present.   Pt used compression dressing throughout the night as well as ice and elevation.

## 2020-07-11 NOTE — Discharge Instructions (Addendum)
I have spoken with Dr. Doreatha Martin about your hand, Call his office tomorrow and request to be seen tomorrow  I have also spoken with Dr. Harrell Gave the on call Cardiologist with your Cardiology group, she is OK with hold your Eliquis until you see the orthopedist - hold this  Take tylenol every 8 hours for pain  Wear the splint, keep the hand above your heart, apply ice over the top of splint  If you develop worsening swelling to the point you lose feeling in your fingers, you fingers turn completely white, have severely worsening pain, go to the Emergency Department

## 2020-07-12 DIAGNOSIS — S62301A Unspecified fracture of second metacarpal bone, left hand, initial encounter for closed fracture: Secondary | ICD-10-CM | POA: Diagnosis not present

## 2020-07-19 DIAGNOSIS — S62301A Unspecified fracture of second metacarpal bone, left hand, initial encounter for closed fracture: Secondary | ICD-10-CM | POA: Diagnosis not present

## 2020-07-27 ENCOUNTER — Other Ambulatory Visit: Payer: Self-pay | Admitting: Cardiology

## 2020-08-03 DIAGNOSIS — S62301A Unspecified fracture of second metacarpal bone, left hand, initial encounter for closed fracture: Secondary | ICD-10-CM | POA: Diagnosis not present

## 2020-08-06 ENCOUNTER — Telehealth: Payer: PPO | Admitting: Physician Assistant

## 2020-08-10 ENCOUNTER — Other Ambulatory Visit: Payer: Self-pay | Admitting: Physician Assistant

## 2020-08-10 NOTE — Telephone Encounter (Signed)
Please review

## 2020-08-12 ENCOUNTER — Encounter: Payer: Self-pay | Admitting: Internal Medicine

## 2020-08-30 ENCOUNTER — Other Ambulatory Visit: Payer: Self-pay | Admitting: Physician Assistant

## 2020-08-31 NOTE — Telephone Encounter (Signed)
Apt 10/18

## 2020-09-02 DIAGNOSIS — S62301D Unspecified fracture of second metacarpal bone, left hand, subsequent encounter for fracture with routine healing: Secondary | ICD-10-CM | POA: Diagnosis not present

## 2020-09-08 DIAGNOSIS — S62391D Other fracture of second metacarpal bone, left hand, subsequent encounter for fracture with routine healing: Secondary | ICD-10-CM | POA: Diagnosis not present

## 2020-09-08 DIAGNOSIS — M79642 Pain in left hand: Secondary | ICD-10-CM | POA: Diagnosis not present

## 2020-09-08 DIAGNOSIS — M6281 Muscle weakness (generalized): Secondary | ICD-10-CM | POA: Diagnosis not present

## 2020-09-08 DIAGNOSIS — M25642 Stiffness of left hand, not elsewhere classified: Secondary | ICD-10-CM | POA: Diagnosis not present

## 2020-09-13 ENCOUNTER — Encounter: Payer: Self-pay | Admitting: Physician Assistant

## 2020-09-13 ENCOUNTER — Telehealth (INDEPENDENT_AMBULATORY_CARE_PROVIDER_SITE_OTHER): Payer: PPO | Admitting: Physician Assistant

## 2020-09-13 ENCOUNTER — Other Ambulatory Visit: Payer: Self-pay | Admitting: Physician Assistant

## 2020-09-13 ENCOUNTER — Telehealth: Payer: Self-pay | Admitting: Physician Assistant

## 2020-09-13 DIAGNOSIS — G4700 Insomnia, unspecified: Secondary | ICD-10-CM | POA: Diagnosis not present

## 2020-09-13 DIAGNOSIS — F411 Generalized anxiety disorder: Secondary | ICD-10-CM | POA: Diagnosis not present

## 2020-09-13 DIAGNOSIS — F319 Bipolar disorder, unspecified: Secondary | ICD-10-CM

## 2020-09-13 MED ORDER — ALPRAZOLAM 1 MG PO TABS
1.0000 mg | ORAL_TABLET | Freq: Two times a day (BID) | ORAL | 1 refills | Status: DC | PRN
Start: 2020-09-13 — End: 2020-10-25

## 2020-09-13 MED ORDER — LAMOTRIGINE 150 MG PO TABS: ORAL_TABLET | ORAL | 2 refills | Status: DC

## 2020-09-13 NOTE — Telephone Encounter (Signed)
[name], alderfer are scheduled for a virtual visit with your provider today.    Just as we do with appointments in the office, we must obtain your consent to participate.  Your consent will be active for this visit and any virtual visit you may have with one of our providers in the next 365 days.    If you have a MyChart account, I can also send a copy of this consent to you electronically.  All virtual visits are billed to your insurance company just like a traditional visit in the office.  As this is a virtual visit, video technology does not allow for your provider to perform a traditional examination.  This may limit your provider's ability to fully assess your condition.  If your provider identifies any concerns that need to be evaluated in person or the need to arrange testing such as labs, EKG, etc, we will make arrangements to do so.    Although advances in technology are sophisticated, we cannot ensure that it will always work on either your end or our end.  If the connection with a video visit is poor, we may have to switch to a telephone visit.  With either a video or telephone visit, we are not always able to ensure that we have a secure connection.   I need to obtain your verbal consent now.   Are you willing to proceed with your visit today?   Margaret Mckenzie has provided verbal consent on 09/13/2020 for a virtual visit (video or telephone).   Donnal Moat, PA-C 09/13/2020  2:57 PM

## 2020-09-13 NOTE — Telephone Encounter (Signed)
Please review

## 2020-09-13 NOTE — Progress Notes (Signed)
[facility]  Patient ID: Margaret Mckenzie,  MRN: [id number]  PCP: Isaac Bliss, Rayford Halsted, MD  Date of Evaluation: 09/13/2020 Time spent:20 minutes  Chief Complaint:  Chief Complaint    Anxiety; Depression; Insomnia     Virtual Visit via Telehealth  I connected with patient by a video enabled telemedicine application with their informed consent, and verified patient privacy and that I am speaking with the correct person using two identifiers.  I am private, in my office and the patient is at home.  I discussed the limitations, risks, security and privacy concerns of performing an evaluation and management service by video and the availability of in person appointments. I also discussed with the patient that there may be a patient responsible charge related to this service. The patient expressed understanding and agreed to proceed.   I discussed the assessment and treatment plan with the patient. The patient was provided an opportunity to ask questions and all were answered. The patient agreed with the plan and demonstrated an understanding of the instructions.   The patient was advised to call back or seek an in-person evaluation if the symptoms worsen or if the condition fails to improve as anticipated.  I provided 20 minutes of non-face-to-face time during this encounter.  HISTORY/CURRENT STATUS: HPI for routine med check   Since our last visit, Norine broke her left wrist. Was out of work 2 months with that, but finally back at work. It's healed well. Has been out of work for a total of 6 months this year d/t illness.  "It's hard. My job has been great about it all." Works at [employer].   The anxiety does seem to be a bit worse and the Xanax is not really helping as much as it used to, or at least last as long.  She is taking a total of 1.5 mg/day.  Since she is back at work now and the pace at [employer] is really quick right now due to the  [employer] in [place], she gets more anxious.  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  She is wondering if counseling could help that.  Patient denies loss of interest in usual activities and is able to enjoy things when she has the opportunity.  Due to her illnesses this year, she has not been able to do much.  Denies decreased energy or motivation.  Appetite has not changed.  No extreme sadness, tearfulness, or feelings of hopelessness.  Denies any changes in concentration, making decisions or remembering things.  Sleeps well with the trazodone.  Denies suicidal or homicidal thoughts.  Patient denies increased energy with decreased need for sleep, no increased talkativeness, no racing thoughts, no impulsivity or risky behaviors, no increased spending, no increased libido, no grandiosity, no increased irritability or anger, no paranoia, and no hallucinations.  Denies dizziness, syncope, seizures, numbness, tingling, tremor, tics, unsteady gait, slurred speech, confusion. Denies muscle or joint pain, stiffness, or dystonia.  Individual Medical History/ Review of Systems: Changes? :Yes Fractured left wrist.  Almost completely healed now.  Past medications for mental health diagnoses include: Depakote, Lamictal, Prozac, Lexapro, questionable Ambien, Xanax, Sonata, Risperdal  Allergies: Patient has no known allergies.  Current Medications:  Current Outpatient Medications:  .  acetaminophen (TYLENOL) 500 MG tablet, Take 2 tablets (1,000 mg total) by mouth every 8 (eight) hours as needed., Disp:  30 tablet, Rfl: 0 .  apixaban (ELIQUIS) 5 MG TABS tablet, Take 1 tablet (5 mg total) by mouth 2 (two) times daily., Disp: 180 tablet, Rfl: 3 .  busPIRone (BUSPAR) 15 MG tablet, TAKE 1/3 TABLET BY MOUTH TWICE A DAY FOR 1 WEEK, THEN 2/3 TWICE A DAY FOR 1 WEEK, THEN 1 TABLET DAILY THEREAFTER (Patient taking differently:  15 mg daily. TAKE 1/3 TABLET BY MOUTH TWICE A DAY FOR 1 WEEK, THEN 2/3 TWICE A DAY FOR 1 WEEK, THEN 1 TABLET DAILY THEREAFTER), Disp: 180 tablet, Rfl: 0 .  Calcium Carbonate-Vit D-Min (CALCIUM 1200 PO), Take by mouth., Disp: , Rfl:  .  cholecalciferol (VITAMIN D) 1000 units tablet, Take 1,000 Units by mouth daily., Disp: , Rfl:  .  cyanocobalamin (,VITAMIN B-12,) 1000 MCG/ML injection, Inject 1 mL (1,000 mcg total) into the muscle every 30 (thirty) days., Disp: 4 mL, Rfl: 2 .  diltiazem (CARDIZEM) 30 MG tablet, Take 1 tablet every 4 hours AS NEEDED for AFIB heart rate >100, Disp: 45 tablet, Rfl: 1 .  lamoTRIgine (LAMICTAL) 150 MG tablet, TAKE 2 TABLETS BY MOUTH EVERY DAY AT BEDTIME, Disp: 180 tablet, Rfl: 2 .  lisinopril-hydrochlorothiazide (ZESTORETIC) 20-25 MG tablet, TAKE 1 TABLET BY MOUTH EVERY DAY, Disp: 90 tablet, Rfl: 1 .  Magnesium 500 MG TABS, Take 1 tablet by mouth at bedtime., Disp: , Rfl:  .  risperiDONE (RISPERDAL) 3 MG tablet, TAKE 1 TABLET BY MOUTH AT BEDTIME, Disp: 90 tablet, Rfl: 2 .  SYRINGE-NEEDLE, DISP, 3 ML (BD ECLIPSE SYRINGE) 25G X 5/8" 3 ML MISC, Use as directed, Disp: 50 each, Rfl: 0 .  traZODone (DESYREL) 50 MG tablet, TAKE 1-2 TABLETS BY MOUTH AT BEDTIME AS NEEDED FOR SLEEP, Disp: 180 tablet, Rfl: 2 .  ALPRAZolam (XANAX) 1 MG tablet, Take 1 tablet (1 mg total) by mouth 2 (two) times daily as needed for anxiety., Disp: 60 tablet, Rfl: 1 .  Ascorbic Acid (VITAMIN C) 1000 MG tablet, Take 1,000 mg by mouth daily. (Patient not taking: Reported on 09/13/2020), Disp: , Rfl:   Current Facility-Administered Medications:  .  0.9 %  sodium chloride infusion, 500 mL, Intravenous, Once, Carlean Purl Ofilia Neas, MD Medication Side Effects: none  Family Medical/ Social History: Changes? No  MENTAL HEALTH EXAM:  There were no vitals taken for this visit.There is no height or weight on file to calculate BMI.  General Appearance: Casual, Neat and Well Groomed  Eye Contact:  Good  Speech:   Clear and Coherent and Normal Rate  Volume:  Normal  Mood:  Euthymic  Affect:  Appropriate  Thought Process:  Goal Directed and Descriptions of Associations: Intact  Orientation:  Full (Time, Place, and Person)  Thought Content: Logical   Suicidal Thoughts:  No  Homicidal Thoughts:  No  Memory:  WNL  Judgement:  Good  Insight:  Good  Psychomotor Activity:  From the shoulders up she appears normal  Concentration:  Concentration: Good  Recall:  Good  Fund of Knowledge: Good  Language: Good  Assets:  Desire for Improvement  ADL's:  Intact  Cognition: WNL  Prognosis:  Good   Most recent labs 04/23/2020 CBC with differential was normal.  CMP glucose was normal, hemoglobin A1c was 5.2, total cholesterol was 279, LDL was 102, the rest of the lipid panel was normal.  DIAGNOSES:    ICD-10-CM   1. Generalized anxiety disorder  F41.1   2. Bipolar I disorder ([place])  F31.9   3. Insomnia, unspecified type  G47.00     Receiving Psychotherapy: No    RECOMMENDATIONS:  PDMP reviewed. I provided 20 minutes of nonface-to-face time during this encounter. We discussed the anxiety.  I think it would help to increase the Xanax to 1 mg and she can take that for a total of twice a day.  Or she can take one half a pill if she feels like that will be enough to get her through.  So we are increasing the dose from 1.5 mg/day to 2 mg/day.  If the anxiety continues we may need to increase the BuSpar. Increase  Xanax to to 1 mg, 1 p.o. twice daily as needed.   Continue BuSpar 15 mg, 1 p.o. daily. Continue Lamictal 150 mg, 2 p.o. nightly. Continue Risperdal 3 mg, 1 p.o. nightly. Continue trazodone 50 mg, 1-2 nightly as needed sleep. Referred to [facility] for counseling. Return in 6 weeks.  Donnal Moat, PA-C

## 2020-09-14 DIAGNOSIS — M25562 Pain in left knee: Secondary | ICD-10-CM | POA: Diagnosis not present

## 2020-09-14 DIAGNOSIS — M6281 Muscle weakness (generalized): Secondary | ICD-10-CM | POA: Diagnosis not present

## 2020-09-14 DIAGNOSIS — M25662 Stiffness of left knee, not elsewhere classified: Secondary | ICD-10-CM | POA: Diagnosis not present

## 2020-09-14 DIAGNOSIS — S83015D Lateral dislocation of left patella, subsequent encounter: Secondary | ICD-10-CM | POA: Diagnosis not present

## 2020-09-16 DIAGNOSIS — M6281 Muscle weakness (generalized): Secondary | ICD-10-CM | POA: Diagnosis not present

## 2020-09-16 DIAGNOSIS — M25642 Stiffness of left hand, not elsewhere classified: Secondary | ICD-10-CM | POA: Diagnosis not present

## 2020-09-16 DIAGNOSIS — M79642 Pain in left hand: Secondary | ICD-10-CM | POA: Diagnosis not present

## 2020-09-16 DIAGNOSIS — S62391D Other fracture of second metacarpal bone, left hand, subsequent encounter for fracture with routine healing: Secondary | ICD-10-CM | POA: Diagnosis not present

## 2020-09-21 DIAGNOSIS — S62391D Other fracture of second metacarpal bone, left hand, subsequent encounter for fracture with routine healing: Secondary | ICD-10-CM | POA: Diagnosis not present

## 2020-09-21 DIAGNOSIS — M79642 Pain in left hand: Secondary | ICD-10-CM | POA: Diagnosis not present

## 2020-09-21 DIAGNOSIS — M25642 Stiffness of left hand, not elsewhere classified: Secondary | ICD-10-CM | POA: Diagnosis not present

## 2020-09-21 DIAGNOSIS — M6281 Muscle weakness (generalized): Secondary | ICD-10-CM | POA: Diagnosis not present

## 2020-09-22 ENCOUNTER — Ambulatory Visit (INDEPENDENT_AMBULATORY_CARE_PROVIDER_SITE_OTHER): Payer: PPO | Admitting: Psychologist

## 2020-09-22 DIAGNOSIS — F411 Generalized anxiety disorder: Secondary | ICD-10-CM | POA: Diagnosis not present

## 2020-09-23 DIAGNOSIS — M79642 Pain in left hand: Secondary | ICD-10-CM | POA: Diagnosis not present

## 2020-09-23 DIAGNOSIS — S62391D Other fracture of second metacarpal bone, left hand, subsequent encounter for fracture with routine healing: Secondary | ICD-10-CM | POA: Diagnosis not present

## 2020-09-23 DIAGNOSIS — M6281 Muscle weakness (generalized): Secondary | ICD-10-CM | POA: Diagnosis not present

## 2020-09-23 DIAGNOSIS — M25642 Stiffness of left hand, not elsewhere classified: Secondary | ICD-10-CM | POA: Diagnosis not present

## 2020-09-28 DIAGNOSIS — S62391D Other fracture of second metacarpal bone, left hand, subsequent encounter for fracture with routine healing: Secondary | ICD-10-CM | POA: Diagnosis not present

## 2020-09-28 DIAGNOSIS — M79642 Pain in left hand: Secondary | ICD-10-CM | POA: Diagnosis not present

## 2020-09-28 DIAGNOSIS — M25642 Stiffness of left hand, not elsewhere classified: Secondary | ICD-10-CM | POA: Diagnosis not present

## 2020-09-28 DIAGNOSIS — M6281 Muscle weakness (generalized): Secondary | ICD-10-CM | POA: Diagnosis not present

## 2020-10-04 DIAGNOSIS — S62391D Other fracture of second metacarpal bone, left hand, subsequent encounter for fracture with routine healing: Secondary | ICD-10-CM | POA: Diagnosis not present

## 2020-10-04 DIAGNOSIS — M25642 Stiffness of left hand, not elsewhere classified: Secondary | ICD-10-CM | POA: Diagnosis not present

## 2020-10-04 DIAGNOSIS — M79642 Pain in left hand: Secondary | ICD-10-CM | POA: Diagnosis not present

## 2020-10-04 DIAGNOSIS — M6281 Muscle weakness (generalized): Secondary | ICD-10-CM | POA: Diagnosis not present

## 2020-10-05 ENCOUNTER — Ambulatory Visit (INDEPENDENT_AMBULATORY_CARE_PROVIDER_SITE_OTHER): Payer: PPO | Admitting: Psychologist

## 2020-10-05 DIAGNOSIS — F411 Generalized anxiety disorder: Secondary | ICD-10-CM | POA: Diagnosis not present

## 2020-10-10 NOTE — Progress Notes (Signed)
Cardiology Office Note   Date:  10/12/2020   ID:  Margaret Mckenzie, Margaret Mckenzie [birth year], MRN [id number]  PCP:  Margaret Mckenzie, Margaret Halsted, MD  Cardiologist:   Margaret Breeding, MD    Chief Complaint  Patient presents with  . Atrial Fibrillation      History of Present Illness: Margaret Mckenzie is a 69 y.o. female who presents for evaluation of atrial fib.  She was noted to be in this in her PCP office in July of 2018.  She as been seen in the Afib Clinic.  She did have an echocardiogram and this demonstrated some LVH and was otherwise unremarkable.  She was in the ED in Dec 2019 for PAF.    Since I last saw her she has done well.  She had a severe bout of labyrinthitis that she aches is related to [employer] vaccine.  However, she otherwise has done well. The patient denies any new symptoms such as chest discomfort, neck or arm discomfort. There has been no new shortness of breath, PND or orthopnea. There have been no reported palpitations, presyncope or syncope.   Of note she does get some episodes and she cannot tell whether it is panic.  Because of these she is taken Cardizem about 3 times in the past year.  She feels anxious and her heart is beating fast.   Past Medical History:  Diagnosis Date  . Bipolar 1 disorder ([place])   . Chicken pox   . Hypertension    high blood pressure readings   . Kidney disease    kidney stones and saw urologist, Dr. Edward Mckenzie for lithotripsy remotely   . PAF (paroxysmal atrial fibrillation) ([place])     Past Surgical History:  Procedure Laterality Date  . BUNIONECTOMY  2007  . TONSILLECTOMY AND ADENOIDECTOMY  1975     Current Outpatient Medications  Medication Sig Dispense Refill  . acetaminophen (TYLENOL) 500 MG tablet Take 2 tablets (1,000 mg total) by mouth every 8 (eight) hours as needed. 30 tablet 0  . ALPRAZolam (XANAX) 1 MG tablet Take 1 tablet (1 mg total) by mouth 2 (two) times daily as needed for anxiety. 60 tablet 1  .  apixaban (ELIQUIS) 5 MG TABS tablet Take 1 tablet (5 mg total) by mouth 2 (two) times daily. 180 tablet 3  . Ascorbic Acid (VITAMIN C) 1000 MG tablet Take 1,000 mg by mouth daily.     . busPIRone (BUSPAR) 15 MG tablet Take 1 tablet (15 mg) by mouth daily 90 tablet 0  . Calcium Carbonate-Vit D-Min (CALCIUM 1200 PO) Take by mouth.    . cholecalciferol (VITAMIN D) 1000 units tablet Take 1,000 Units by mouth daily.    . cyanocobalamin (,VITAMIN B-12,) 1000 MCG/ML injection Inject 1 mL (1,000 mcg total) into the muscle every 30 (thirty) days. 4 mL 2  . diltiazem (CARDIZEM) 30 MG tablet Take 1 tablet every 4 hours AS NEEDED for AFIB heart rate >100 45 tablet 1  . lamoTRIgine (LAMICTAL) 150 MG tablet TAKE 2 TABLETS BY MOUTH EVERY DAY AT BEDTIME 180 tablet 2  . lisinopril-hydrochlorothiazide (ZESTORETIC) 20-25 MG tablet TAKE 1 TABLET BY MOUTH EVERY DAY 90 tablet 1  . Magnesium 500 MG TABS Take 1 tablet by mouth at bedtime.    . risperiDONE (RISPERDAL) 3 MG tablet TAKE 1 TABLET BY MOUTH AT BEDTIME 90 tablet 2  . SYRINGE-NEEDLE, DISP, 3 ML (BD ECLIPSE SYRINGE) 25G X 5/8" 3 ML MISC Use as directed 50  each 0  . traZODone (DESYREL) 50 MG tablet TAKE 1-2 TABLETS BY MOUTH AT BEDTIME AS NEEDED FOR SLEEP 180 tablet 2   Current Facility-Administered Medications  Medication Dose Route Frequency Provider Last Rate Last Admin  . 0.9 %  sodium chloride infusion  500 mL Intravenous Once Margaret Mayer, MD        Allergies:   Patient has no known allergies.    ROS:  Please see the history of present illness.   Otherwise, review of systems are positive for none.   All other systems are reviewed and negative.    PHYSICAL EXAM: VS:  BP 132/78   Pulse 82   Temp 97.9 F (36.6 C)   Ht 5\' 4"  (1.626 m)   Wt 155 lb (70.3 kg)   SpO2 98%   BMI 26.61 kg/m  , BMI Body mass index is 26.61 kg/m.  GENERAL:  Well appearing NECK:  No jugular venous distention, waveform within normal limits, carotid upstroke brisk and  symmetric, no bruits, no thyromegaly LUNGS:  Clear to auscultation bilaterally CHEST:  Unremarkable HEART:  PMI not displaced or sustained,S1 and S2 within normal limits, no S3, no S4, no clicks, no rubs, no murmurs ABD:  Flat, positive bowel sounds normal in frequency in pitch, no bruits, no rebound, no guarding, no midline pulsatile mass, no hepatomegaly, no splenomegaly EXT:  2 plus pulses throughout, no edema, no cyanosis no clubbing   EKG:  EKG is  ordered today. Sinus rhythm, rate 82, axis within normal limits, intervals within normal limits, poor anterior R wave progression, premature ventricular contraction.  No acute ST-T wave changes.  Recent Labs: 04/23/2020: ALT 14; BUN 11; Creatinine, Ser 0.76; Hemoglobin 12.4; Platelets 284.0; Potassium 3.9; Sodium 131; TSH 1.67    Lipid Panel    Component Value Date/Time   CHOL 279 (H) 04/23/2020 0824   CHOL 226 (H) 09/08/2019 1118   TRIG 62.0 04/23/2020 0824   HDL 165.10 04/23/2020 0824   HDL 145 09/08/2019 1118   CHOLHDL 2 04/23/2020 0824   VLDL 12.4 04/23/2020 0824   LDLCALC 102 (H) 04/23/2020 0824   LDLCALC 71 09/08/2019 1118   LDLDIRECT 58.8 08/04/2013 1102      Wt Readings from Last 3 Encounters:  10/12/20 155 lb (70.3 kg)  04/23/20 141 lb 6.4 oz (64.1 kg)  04/01/20 140 lb 8 oz (63.7 kg)      Other studies Reviewed: Additional studies/ records that were reviewed today include:  Labs Review of the above records demonstrates:   See elsewhere     ASSESSMENT AND PLAN:   ATRIAL FIB.  [name] has a CHA2DS2 - VASc score of 3.   She tolerates anticoagulation.  She has had follow-up CBC which was normal earlier this year.  No change in therapy.  She has had no symptomatic paroxysms.   Not that it would change management but we did talk about getting an Alive Cor to see if her infrequent "panic" episodes are actually atrial fibrillation.  HTN:  The blood pressure is at target.  No change in therapy.    Current medicines are reviewed at length with the patient today.  The patient does not have concerns regarding medicines.  The following changes have been made: None  Labs/ tests ordered today include: None  Orders Placed This Encounter  Procedures  . EKG 12-Lead     Disposition:   FU with 12 months.    Signed, Margaret Breeding, MD  10/12/2020  2:05 PM    [facility]

## 2020-10-12 ENCOUNTER — Ambulatory Visit: Payer: PPO | Admitting: Cardiology

## 2020-10-12 ENCOUNTER — Other Ambulatory Visit: Payer: Self-pay

## 2020-10-12 ENCOUNTER — Encounter: Payer: Self-pay | Admitting: Cardiology

## 2020-10-12 VITALS — BP 132/78 | HR 82 | Temp 97.9°F | Ht 64.0 in | Wt 155.0 lb

## 2020-10-12 DIAGNOSIS — I10 Essential (primary) hypertension: Secondary | ICD-10-CM | POA: Diagnosis not present

## 2020-10-12 DIAGNOSIS — I480 Paroxysmal atrial fibrillation: Secondary | ICD-10-CM | POA: Diagnosis not present

## 2020-10-12 MED ORDER — APIXABAN 5 MG PO TABS: 5.0000 mg | ORAL_TABLET | Freq: Two times a day (BID) | ORAL | 3 refills | Status: DC

## 2020-10-12 NOTE — Patient Instructions (Signed)
Medication Instructions:  No changes *If you need a refill on your cardiac medications before your next appointment, please call your pharmacy*   Lab Work: None ordered If you have labs (blood work) drawn today and your tests are completely normal, you will receive your results only by: Marland Kitchen MyChart Message (if you have MyChart) OR . A paper copy in the mail If you have any lab test that is abnormal or we need to change your treatment, we will call you to review the results.   Testing/Procedures: None ordered   Follow-Up: At [facility], you and your health needs are our priority.  As part of our continuing mission to provide you with exceptional heart care, we have created designated Provider Care Teams.  These Care Teams include your primary Cardiologist (physician) and Advanced Practice Providers (APPs -  Physician Assistants and Nurse Practitioners) who all work together to provide you with the care you need, when you need it.  We recommend signing up for the patient portal called "MyChart".  Sign up information is provided on this After Visit Summary.  MyChart is used to connect with patients for Virtual Visits (Telemedicine).  Patients are able to view lab/test results, encounter notes, upcoming appointments, etc.  Non-urgent messages can be sent to your provider as well.   To learn more about what you can do with MyChart, go to [email or web address].    Your next appointment:   12 month(s)  The format for your next appointment:   In Person  Provider:   Minus Breeding, MD   Other Instructions AliveCor by Jodelle Red is the name of a home EKG monitoring device that Dr. Percival Spanish recommends.

## 2020-10-18 DIAGNOSIS — M25642 Stiffness of left hand, not elsewhere classified: Secondary | ICD-10-CM | POA: Diagnosis not present

## 2020-10-18 DIAGNOSIS — M79642 Pain in left hand: Secondary | ICD-10-CM | POA: Diagnosis not present

## 2020-10-18 DIAGNOSIS — M6281 Muscle weakness (generalized): Secondary | ICD-10-CM | POA: Diagnosis not present

## 2020-10-18 DIAGNOSIS — S62391D Other fracture of second metacarpal bone, left hand, subsequent encounter for fracture with routine healing: Secondary | ICD-10-CM | POA: Diagnosis not present

## 2020-10-25 ENCOUNTER — Encounter: Payer: Self-pay | Admitting: Physician Assistant

## 2020-10-25 ENCOUNTER — Telehealth (INDEPENDENT_AMBULATORY_CARE_PROVIDER_SITE_OTHER): Payer: PPO | Admitting: Physician Assistant

## 2020-10-25 DIAGNOSIS — F411 Generalized anxiety disorder: Secondary | ICD-10-CM | POA: Insufficient documentation

## 2020-10-25 DIAGNOSIS — F410 Panic disorder [episodic paroxysmal anxiety] without agoraphobia: Secondary | ICD-10-CM

## 2020-10-25 DIAGNOSIS — G4700 Insomnia, unspecified: Secondary | ICD-10-CM

## 2020-10-25 DIAGNOSIS — F319 Bipolar disorder, unspecified: Secondary | ICD-10-CM

## 2020-10-25 MED ORDER — ALPRAZOLAM 1 MG PO TABS: 1.0000 mg | ORAL_TABLET | Freq: Two times a day (BID) | ORAL | 5 refills | Status: DC | PRN

## 2020-10-25 NOTE — Progress Notes (Signed)
[facility] Med Check  Patient ID: Margaret Mckenzie,  MRN: [id number]  PCP: Isaac Bliss, Rayford Halsted, MD  Date of Evaluation: 10/25/2020 Time spent:20 minutes  Chief Complaint:  Chief Complaint    Anxiety; Depression; Insomnia     Virtual Visit via Telehealth  I connected with patient by a video enabled telemedicine application with their informed consent, and verified patient privacy and that I am speaking with the correct person using two identifiers.  I am private, in my office and the patient is at home.  I discussed the limitations, risks, security and privacy concerns of performing an evaluation and management service by video and the availability of in person appointments. I also discussed with the patient that there may be a patient responsible charge related to this service. The patient expressed understanding and agreed to proceed.   I discussed the assessment and treatment plan with the patient. The patient was provided an opportunity to ask questions and all were answered. The patient agreed with the plan and demonstrated an understanding of the instructions.   The patient was advised to call back or seek an in-person evaluation if the symptoms worsen or if the condition fails to improve as anticipated.  I provided 20 minutes of non-face-to-face time during this encounter.  HISTORY/CURRENT STATUS: HPI for routine med check   Margaret Mckenzie states she is doing great!  The anxiety is much much better.  She has been to 2 sessions of counseling since our last appointment and that has been super helpful.  She meditates daily and uses other techniques that she has learned through therapy.  6 weeks ago we increased the dose of Xanax and that has been very helpful.  She takes it twice a day almost every day.  It is helpful in the morning when she is her busiest at work and then in the evening, to wind down and go to sleep.  Patient denies loss of interest in usual activities and  is able to enjoy things. Denies decreased energy or motivation.  Appetite has not changed.  No extreme sadness, tearfulness, or feelings of hopelessness. Sleeps well with the trazodone.  Denies suicidal or homicidal thoughts.  Patient denies increased energy with decreased need for sleep, no increased talkativeness, no racing thoughts, no impulsivity or risky behaviors, no increased spending, no increased libido, no grandiosity, no increased irritability or anger, no paranoia, and no hallucinations.  Denies dizziness, syncope, seizures, numbness, tingling, tremor, tics, unsteady gait, slurred speech, confusion. Denies muscle or joint pain, stiffness, or dystonia.  Individual Medical History/ Review of Systems: Changes? :No    Past medications for mental health diagnoses include: Depakote, Lamictal, Prozac, Lexapro, questionable Ambien, Xanax, Sonata, Risperdal  Allergies: Patient has no known allergies.  Current Medications:  Current Outpatient Medications:  .  acetaminophen (TYLENOL) 500 MG tablet, Take 2 tablets (1,000 mg total) by mouth every 8 (eight) hours as needed., Disp: 30 tablet, Rfl: 0 .  ALPRAZolam (XANAX) 1 MG tablet, Take 1 tablet (1 mg total) by mouth 2 (two) times daily as needed for anxiety., Disp: 60 tablet, Rfl: 5 .  apixaban (ELIQUIS) 5 MG TABS tablet, Take 1 tablet (5 mg total) by mouth 2 (two) times daily., Disp: 180 tablet, Rfl: 3 .  Ascorbic Acid (VITAMIN C) 1000 MG tablet, Take 1,000 mg by mouth daily. , Disp: , Rfl:  .  busPIRone (BUSPAR) 15 MG tablet, Take 1 tablet (15 mg) by mouth daily, Disp: 90 tablet, Rfl: 0 .  Calcium Carbonate-Vit  D-Min (CALCIUM 1200 PO), Take by mouth., Disp: , Rfl:  .  cholecalciferol (VITAMIN D) 1000 units tablet, Take 1,000 Units by mouth daily., Disp: , Rfl:  .  cyanocobalamin (,VITAMIN B-12,) 1000 MCG/ML injection, Inject 1 mL (1,000 mcg total) into the muscle every 30 (thirty) days., Disp: 4 mL, Rfl: 2 .  diltiazem (CARDIZEM) 30 MG  tablet, Take 1 tablet every 4 hours AS NEEDED for AFIB heart rate >100, Disp: 45 tablet, Rfl: 1 .  lamoTRIgine (LAMICTAL) 150 MG tablet, TAKE 2 TABLETS BY MOUTH EVERY DAY AT BEDTIME, Disp: 180 tablet, Rfl: 2 .  lisinopril-hydrochlorothiazide (ZESTORETIC) 20-25 MG tablet, TAKE 1 TABLET BY MOUTH EVERY DAY, Disp: 90 tablet, Rfl: 1 .  Magnesium 500 MG TABS, Take 1 tablet by mouth at bedtime., Disp: , Rfl:  .  risperiDONE (RISPERDAL) 3 MG tablet, TAKE 1 TABLET BY MOUTH AT BEDTIME, Disp: 90 tablet, Rfl: 2 .  SYRINGE-NEEDLE, DISP, 3 ML (BD ECLIPSE SYRINGE) 25G X 5/8" 3 ML MISC, Use as directed, Disp: 50 each, Rfl: 0 .  traZODone (DESYREL) 50 MG tablet, TAKE 1-2 TABLETS BY MOUTH AT BEDTIME AS NEEDED FOR SLEEP, Disp: 180 tablet, Rfl: 2  Current Facility-Administered Medications:  .  0.9 %  sodium chloride infusion, 500 mL, Intravenous, Once, Carlean Purl Ofilia Neas, MD Medication Side Effects: none  Family Medical/ Social History: Changes? No  MENTAL HEALTH EXAM:  There were no vitals taken for this visit.There is no height or weight on file to calculate BMI.  General Appearance: Casual, Neat and Well Groomed  Eye Contact:  Good  Speech:  Clear and Coherent and Normal Rate  Volume:  Normal  Mood:  Euthymic  Affect:  Appropriate  Thought Process:  Goal Directed and Descriptions of Associations: Intact  Orientation:  Full (Time, Place, and Person)  Thought Content: Logical   Suicidal Thoughts:  No  Homicidal Thoughts:  No  Memory:  WNL  Judgement:  Good  Insight:  Good  Psychomotor Activity:  From the shoulders up she appears normal  Concentration:  Concentration: Good  Recall:  Good  Fund of Knowledge: Good  Language: Good  Assets:  Desire for Improvement  ADL's:  Intact  Cognition: WNL  Prognosis:  Good   Most recent pertinent labs:  04/23/2020 CBC with differential was normal.  CMP glucose was normal, hemoglobin A1c was 5.2, total cholesterol was 279, LDL was 102, the rest of the lipid panel  was normal.  DIAGNOSES:    ICD-10-CM   1. Generalized anxiety disorder  F41.1   2. Panic disorder  F41.0   3. Bipolar I disorder ([place])  F31.9   4. Insomnia, unspecified type  G47.00     Receiving Psychotherapy: Yes Matt Schooler    RECOMMENDATIONS:  PDMP reviewed. I provided 20 minutes of nonface-to-face time during this encounter. I am glad to see her doing so well! Continue Xanax 1 mg, 1 p.o. twice daily as needed.   Continue BuSpar 15 mg, 1 p.o. daily. Continue Lamictal 150 mg, 2 p.o. nightly. Continue Risperdal 3 mg, 1 p.o. nightly. Continue trazodone 50 mg, 1-2 nightly as needed sleep. Continue therapy with Elias Else  Return in 6 months.  Donnal Moat, PA-C

## 2020-10-26 ENCOUNTER — Telehealth (INDEPENDENT_AMBULATORY_CARE_PROVIDER_SITE_OTHER): Payer: PPO | Admitting: Internal Medicine

## 2020-10-26 DIAGNOSIS — H8309 Labyrinthitis, unspecified ear: Secondary | ICD-10-CM

## 2020-10-26 NOTE — Progress Notes (Signed)
Virtual Visit via Video Note  I connected with Margaret Mckenzie on 10/26/20 at  3:30 PM EST by a video enabled telemedicine application and verified that I am speaking with the correct person using two identifiers.  Location patient: home Location provider: work office Persons participating in the virtual visit: patient, provider  I discussed the limitations of evaluation and management by telemedicine and the availability of in person appointments. The patient expressed understanding and agreed to proceed.   HPI:  Margaret Mckenzie is a 69 y.o. female who is coming in today for the above mentioned reasons.  She completed her initial [place] vaccination series in April.  3 days after her second dose she had a severe case of vertigo that was ultimately diagnosed as acute labyrinthitis by ENT and treated with steroids and meclizine.  This was of much concern to her, caused her to take days off of work, she had to go through vestibular therapy.  She is not due for her Covid booster and has concerns about it.    ROS: Constitutional: Denies fever, chills, diaphoresis, appetite change and fatigue.  HEENT: Denies photophobia, eye pain, redness, hearing loss, ear pain, congestion, sore throat, rhinorrhea, sneezing, mouth sores, trouble swallowing, neck pain, neck stiffness and tinnitus.   Respiratory: Denies SOB, DOE, cough, chest tightness,  and wheezing.   Cardiovascular: Denies chest pain, palpitations and leg swelling.  Gastrointestinal: Denies nausea, vomiting, abdominal pain, diarrhea, constipation, blood in stool and abdominal distention.  Genitourinary: Denies dysuria, urgency, frequency, hematuria, flank pain and difficulty urinating.  Endocrine: Denies: hot or cold intolerance, sweats, changes in hair or nails, polyuria, polydipsia. Musculoskeletal: Denies myalgias, back pain, joint swelling, arthralgias and gait problem.  Skin: Denies pallor, rash and wound.    Neurological: Denies dizziness, seizures, syncope, weakness, light-headedness, numbness and headaches.  Hematological: Denies adenopathy. Easy bruising, personal or family bleeding history  Psychiatric/Behavioral: Denies suicidal ideation, mood changes, confusion, nervousness, sleep disturbance and agitation   Past Medical History:  Diagnosis Date  . Bipolar 1 disorder ([place])   . Chicken pox   . Hypertension    high blood pressure readings   . Kidney disease    kidney stones and saw urologist, Dr. Edward Qualia for lithotripsy remotely   . PAF (paroxysmal atrial fibrillation) ([place])     Past Surgical History:  Procedure Laterality Date  . BUNIONECTOMY  2007  . TONSILLECTOMY AND ADENOIDECTOMY  1975    Family History  Problem Relation Age of Onset  . Osteoporosis Mother   . Stroke Mother        TIA, [place]  . Mental retardation Mother   . Hypertension Father   . Heart disease Father 79  . Stroke Father 68  . Mental illness Sister   . Breast cancer Neg Hx     SOCIAL HX:   reports that she quit smoking about 3 years ago. Her smoking use included cigarettes. She smoked 0.50 packs per day. She has never used smokeless tobacco. She reports current alcohol use of about 7.0 standard drinks of alcohol per week. She reports that she does not use drugs.   Current Outpatient Medications:  .  acetaminophen (TYLENOL) 500 MG tablet, Take 2 tablets (1,000 mg total) by mouth every 8 (eight) hours as needed., Disp: 30 tablet, Rfl: 0 .  ALPRAZolam (XANAX) 1 MG tablet, Take 1 tablet (1 mg total) by mouth 2 (two) times daily as needed for anxiety., Disp: 60 tablet, Rfl: 5 .  apixaban (  ELIQUIS) 5 MG TABS tablet, Take 1 tablet (5 mg total) by mouth 2 (two) times daily., Disp: 180 tablet, Rfl: 3 .  Ascorbic Acid (VITAMIN C) 1000 MG tablet, Take 1,000 mg by mouth daily. , Disp: , Rfl:  .  busPIRone (BUSPAR) 15 MG tablet, Take 1 tablet (15 mg) by mouth daily, Disp: 90 tablet, Rfl: 0 .  Calcium  Carbonate-Vit D-Min (CALCIUM 1200 PO), Take by mouth., Disp: , Rfl:  .  cholecalciferol (VITAMIN D) 1000 units tablet, Take 1,000 Units by mouth daily., Disp: , Rfl:  .  cyanocobalamin (,VITAMIN B-12,) 1000 MCG/ML injection, Inject 1 mL (1,000 mcg total) into the muscle every 30 (thirty) days., Disp: 4 mL, Rfl: 2 .  diltiazem (CARDIZEM) 30 MG tablet, Take 1 tablet every 4 hours AS NEEDED for AFIB heart rate >100, Disp: 45 tablet, Rfl: 1 .  lamoTRIgine (LAMICTAL) 150 MG tablet, TAKE 2 TABLETS BY MOUTH EVERY DAY AT BEDTIME, Disp: 180 tablet, Rfl: 2 .  lisinopril-hydrochlorothiazide (ZESTORETIC) 20-25 MG tablet, TAKE 1 TABLET BY MOUTH EVERY DAY, Disp: 90 tablet, Rfl: 1 .  Magnesium 500 MG TABS, Take 1 tablet by mouth at bedtime., Disp: , Rfl:  .  risperiDONE (RISPERDAL) 3 MG tablet, TAKE 1 TABLET BY MOUTH AT BEDTIME, Disp: 90 tablet, Rfl: 2 .  SYRINGE-NEEDLE, DISP, 3 ML (BD ECLIPSE SYRINGE) 25G X 5/8" 3 ML MISC, Use as directed, Disp: 50 each, Rfl: 0 .  traZODone (DESYREL) 50 MG tablet, TAKE 1-2 TABLETS BY MOUTH AT BEDTIME AS NEEDED FOR SLEEP, Disp: 180 tablet, Rfl: 2  Current Facility-Administered Medications:  .  0.9 %  sodium chloride infusion, 500 mL, Intravenous, Once, Gatha Mayer, MD  EXAM:   VITALS per patient if applicable: None reported  GENERAL: alert, oriented, appears well and in no acute distress  HEENT: atraumatic, conjunttiva clear, no obvious abnormalities on inspection of external nose and ears, wears corrective lenses  NECK: normal movements of the head and neck  LUNGS: on inspection no signs of respiratory distress, breathing rate appears normal, no obvious gross increased work of breathing, gasping or wheezing  CV: no obvious cyanosis  MS: moves all visible extremities without noticeable abnormality  PSYCH/NEURO: pleasant and cooperative, no obvious depression or anxiety, speech and thought processing grossly intact  ASSESSMENT AND PLAN:   Labyrinthitis,  unspecified laterality -After literature review, there have been some case reports of dizziness and vertigo following COVID-19 vaccination but no causal relationship between it and the COVID-19 vaccination have been found. -At this point I believe that the benefits of having her Covid booster outweighs the risk of recurrent labyrinthitis.  Time spent: 22 minutes discussing history with patient and performing literature review.   I discussed the assessment and treatment plan with the patient. The patient was provided an opportunity to ask questions and all were answered. The patient agreed with the plan and demonstrated an understanding of the instructions.   The patient was advised to call back or seek an in-person evaluation if the symptoms worsen or if the condition fails to improve as anticipated.    [name], MD  [facility]

## 2020-10-27 ENCOUNTER — Ambulatory Visit: Payer: PPO | Admitting: Psychologist

## 2020-10-29 ENCOUNTER — Ambulatory Visit: Payer: PPO | Attending: Internal Medicine

## 2020-10-29 DIAGNOSIS — Z23 Encounter for immunization: Secondary | ICD-10-CM

## 2020-10-29 NOTE — Progress Notes (Signed)
   Covid-19 Vaccination Clinic  Name:  Margaret Mckenzie    MRN: [id number] DOB: 1951-03-12  10/29/2020  [name] was observed post Covid-19 immunization for 15 minutes without incident. She was provided with Vaccine Information Sheet and instruction to access the V-Safe system.   [name] was instructed to call 911 with any severe reactions post vaccine: Marland Kitchen Difficulty breathing  . Swelling of face and throat  . A fast heartbeat  . A bad rash all over body  . Dizziness and weakness   Immunizations Administered    Name Date Dose VIS Date [place] COVID-19 Vaccine 10/29/2020  2:29 PM 0.3 mL 09/15/2020 Intramuscular   Manufacturer: [employer]   Lot: [id number]   NDC: [address]-1

## 2020-12-07 ENCOUNTER — Encounter: Payer: Self-pay | Admitting: Internal Medicine

## 2020-12-07 ENCOUNTER — Other Ambulatory Visit: Payer: Self-pay | Admitting: Physician Assistant

## 2020-12-08 MED ORDER — TRIAMCINOLONE ACETONIDE 0.1 % EX CREA
1.0000 | TOPICAL_CREAM | Freq: Two times a day (BID) | CUTANEOUS | 0 refills | Status: DC
Start: 2020-12-08 — End: 2021-07-16

## 2020-12-21 MED ORDER — DILTIAZEM HCL 30 MG PO TABS: ORAL_TABLET | ORAL | 1 refills | Status: DC

## 2020-12-29 DIAGNOSIS — N959 Unspecified menopausal and perimenopausal disorder: Secondary | ICD-10-CM | POA: Diagnosis not present

## 2021-01-14 ENCOUNTER — Other Ambulatory Visit: Payer: Self-pay | Admitting: Cardiology

## 2021-04-26 ENCOUNTER — Telehealth (INDEPENDENT_AMBULATORY_CARE_PROVIDER_SITE_OTHER): Payer: PPO | Admitting: Physician Assistant

## 2021-04-26 ENCOUNTER — Encounter: Payer: Self-pay | Admitting: Physician Assistant

## 2021-04-26 DIAGNOSIS — F411 Generalized anxiety disorder: Secondary | ICD-10-CM

## 2021-04-26 DIAGNOSIS — F410 Panic disorder [episodic paroxysmal anxiety] without agoraphobia: Secondary | ICD-10-CM

## 2021-04-26 DIAGNOSIS — F319 Bipolar disorder, unspecified: Secondary | ICD-10-CM

## 2021-04-26 MED ORDER — RISPERIDONE 3 MG PO TABS: 3.0000 mg | ORAL_TABLET | Freq: Every day | ORAL | 2 refills | Status: DC

## 2021-04-26 MED ORDER — ALPRAZOLAM 1 MG PO TABS: 1.0000 mg | ORAL_TABLET | Freq: Two times a day (BID) | ORAL | 5 refills | Status: DC | PRN

## 2021-04-26 MED ORDER — LAMOTRIGINE 150 MG PO TABS: ORAL_TABLET | ORAL | 2 refills | Status: DC

## 2021-04-26 NOTE — Progress Notes (Signed)
[facility] Check  Patient ID: Margaret Mckenzie,  MRN: [id number]  PCP: Isaac Bliss, Rayford Halsted, MD  Date of Evaluation: 04/26/2021 Time spent:25 minutes  Chief Complaint:  Chief Complaint    Anxiety; Insomnia; Depression; Follow-up     Virtual Visit via Telehealth  I connected with patient by telephone, with their informed consent, and verified patient privacy and that I am speaking with the correct person using two identifiers.  I am private, in my office and the patient is at home.  We attempted to have a video visit but weren't able to connect. Phone visit was done.   I discussed the limitations, risks, security and privacy concerns of performing an evaluation and management service by telephone and the availability of in person appointments. I also discussed with the patient that there may be a patient responsible charge related to this service. The patient expressed understanding and agreed to proceed.   I discussed the assessment and treatment plan with the patient. The patient was provided an opportunity to ask questions and all were answered. The patient agreed with the plan and demonstrated an understanding of the instructions.   The patient was advised to call back or seek an in-person evaluation if the symptoms worsen or if the condition fails to improve as anticipated.  I provided 25 minutes of non-face-to-face time during this encounter.  HISTORY/CURRENT STATUS: HPI For routine med check.  Doing great! "I feel really good." Work is very busy but going well.  She is a Educational psychologist at [employer].  She is sleeping well and does not need the trazodone now at all.  She has it on hand though if she does need it.  She does need the Xanax every day.  Has more generalized anxiety than panic attacks.  It is still effective.  Patient denies loss of interest in usual activities and is able to enjoy things.  Denies decreased energy or motivation.  Appetite has not  changed.  No extreme sadness, tearfulness, or feelings of hopelessness.  Denies any changes in concentration, making decisions or remembering things.  Denies suicidal or homicidal thoughts.  Patient denies increased energy with decreased need for sleep, no increased talkativeness, no racing thoughts, no impulsivity or risky behaviors, no increased spending, no increased libido, no grandiosity, no increased irritability or anger, and no hallucinations.  Denies dizziness, syncope, seizures, numbness, tingling, tremor, tics, unsteady gait, slurred speech, confusion. Denies muscle or joint pain, stiffness, or dystonia.  Individual Medical History/ Review of Systems: Changes? :No   Past medications for mental health diagnoses include: Depakote, Lamictal, Prozac, Lexapro, questionable Ambien, Xanax, Sonata, Risperdal  Allergies: Patient has no known allergies.  Current Medications:  Current Outpatient Medications:  .  acetaminophen (TYLENOL) 500 MG tablet, Take 2 tablets (1,000 mg total) by mouth every 8 (eight) hours as needed., Disp: 30 tablet, Rfl: 0 .  apixaban (ELIQUIS) 5 MG TABS tablet, Take 1 tablet (5 mg total) by mouth 2 (two) times daily., Disp: 180 tablet, Rfl: 3 .  busPIRone (BUSPAR) 15 MG tablet, TAKE 1 TABLET BY MOUTH EVERY DAY, Disp: 90 tablet, Rfl: 3 .  Calcium Carbonate-Vit D-Min (CALCIUM 1200 PO), Take by mouth., Disp: , Rfl:  .  cholecalciferol (VITAMIN D) 1000 units tablet, Take 1,000 Units by mouth daily., Disp: , Rfl:  .  cyanocobalamin (,VITAMIN B-12,) 1000 MCG/ML injection, Inject 1 mL (1,000 mcg total) into the muscle every 30 (thirty) days., Disp: 4 mL, Rfl: 2 .  diltiazem (CARDIZEM) 30 MG  tablet, Take 1 tablet every 4 hours AS NEEDED for AFIB heart rate >100, Disp: 45 tablet, Rfl: 1 .  lisinopril-hydrochlorothiazide (ZESTORETIC) 20-25 MG tablet, TAKE 1 TABLET BY MOUTH EVERY DAY, Disp: 90 tablet, Rfl: 1 .  Magnesium 500 MG TABS, Take 1 tablet by mouth at bedtime., Disp: ,  Rfl:  .  SYRINGE-NEEDLE, DISP, 3 ML (BD ECLIPSE SYRINGE) 25G X 5/8" 3 ML MISC, Use as directed, Disp: 50 each, Rfl: 0 .  traZODone (DESYREL) 50 MG tablet, TAKE 1-2 TABLETS BY MOUTH AT BEDTIME AS NEEDED FOR SLEEP, Disp: 180 tablet, Rfl: 2 .  ALPRAZolam (XANAX) 1 MG tablet, Take 1 tablet (1 mg total) by mouth 2 (two) times daily as needed for anxiety., Disp: 60 tablet, Rfl: 5 .  Ascorbic Acid (VITAMIN C) 1000 MG tablet, Take 1,000 mg by mouth daily.  (Patient not taking: Reported on 04/26/2021), Disp: , Rfl:  .  lamoTRIgine (LAMICTAL) 150 MG tablet, TAKE 2 TABLETS BY MOUTH EVERY DAY AT BEDTIME, Disp: 180 tablet, Rfl: 2 .  risperiDONE (RISPERDAL) 3 MG tablet, Take 1 tablet (3 mg total) by mouth at bedtime., Disp: 90 tablet, Rfl: 2 .  triamcinolone (KENALOG) 0.1 %, Apply 1 application topically 2 (two) times daily. (Patient not taking: Reported on 04/26/2021), Disp: 30 g, Rfl: 0  Current Facility-Administered Medications:  .  0.9 %  sodium chloride infusion, 500 mL, Intravenous, Once, Carlean Purl Ofilia Neas, MD Medication Side Effects: none  Family Medical/ Social History: Changes?  No  MENTAL HEALTH EXAM:  There were no vitals taken for this visit.There is no height or weight on file to calculate BMI.  General Appearance: Unable to assess  Eye Contact:  Unable to assess  Speech:  Clear and Coherent and Normal Rate  Volume:  Normal  Mood:  Euthymic  Affect:  Unable to assess  Thought Process:  Goal Directed and Descriptions of Associations: Circumstantial  Orientation:  Full (Time, Place, and Person)  Thought Content: Logical   Suicidal Thoughts:  No  Homicidal Thoughts:  No  Memory:  WNL  Judgement:  Good  Insight:  Good  Psychomotor Activity:  Normal  Concentration:  Concentration: Good  Recall:  Good  Fund of Knowledge: Good  Language: Good  Assets:  Desire for Improvement  ADL's:  Intact  Cognition: WNL  Prognosis:  Good    DIAGNOSES:    ICD-10-CM   1. Generalized anxiety disorder   F41.1   2. Bipolar I disorder ([place])  F31.9   3. Panic disorder  F41.0     Receiving Psychotherapy: No    RECOMMENDATIONS:  PDMP was reviewed. I provided 25 minutes of non-face-to-face time during this encounter, including time spent before and after the visit in records review, medical decision making, and charting.  I am glad to see that she is doing so well!  No changes in meds are necessary. She has an upcoming physical and will have her PCP send labs to me. Continue Xanax 1 mg, 1 p.o. twice daily as needed. Continue BuSpar 15 mg, 1 p.o. daily. Continue Lamictal 150 mg, 2 p.o. nightly. Continue Risperdal 3 mg, 1 p.o. nightly. Continue trazodone 50 mg, 1-2 p.o. nightly as needed. Return in 6 months.  Donnal Moat, PA-C

## 2021-05-01 ENCOUNTER — Other Ambulatory Visit: Payer: Self-pay | Admitting: Physician Assistant

## 2021-05-04 ENCOUNTER — Other Ambulatory Visit: Payer: Self-pay | Admitting: Internal Medicine

## 2021-05-04 DIAGNOSIS — Z1231 Encounter for screening mammogram for malignant neoplasm of breast: Secondary | ICD-10-CM

## 2021-05-07 ENCOUNTER — Other Ambulatory Visit: Payer: Self-pay | Admitting: Internal Medicine

## 2021-06-29 DIAGNOSIS — M1711 Unilateral primary osteoarthritis, right knee: Secondary | ICD-10-CM | POA: Diagnosis not present

## 2021-07-01 ENCOUNTER — Other Ambulatory Visit: Payer: Self-pay

## 2021-07-01 ENCOUNTER — Ambulatory Visit
Admission: RE | Admit: 2021-07-01 | Discharge: 2021-07-01 | Disposition: A | Discharge status: Home / Self Care | Payer: PPO | Source: Ambulatory Visit | Attending: Internal Medicine | Admitting: Internal Medicine

## 2021-07-01 DIAGNOSIS — Z1231 Encounter for screening mammogram for malignant neoplasm of breast: Secondary | ICD-10-CM

## 2021-07-07 ENCOUNTER — Other Ambulatory Visit: Payer: Self-pay | Admitting: Cardiology

## 2021-07-14 ENCOUNTER — Other Ambulatory Visit: Payer: Self-pay

## 2021-07-15 ENCOUNTER — Ambulatory Visit (INDEPENDENT_AMBULATORY_CARE_PROVIDER_SITE_OTHER): Payer: PPO | Admitting: Internal Medicine

## 2021-07-15 ENCOUNTER — Other Ambulatory Visit (HOSPITAL_COMMUNITY)
Admission: RE | Admit: 2021-07-15 | Discharge: 2021-07-15 | Disposition: A | Discharge status: Home / Self Care | Payer: PPO | Source: Ambulatory Visit | Attending: Internal Medicine | Admitting: Internal Medicine

## 2021-07-15 ENCOUNTER — Encounter: Payer: Self-pay | Admitting: Internal Medicine

## 2021-07-15 ENCOUNTER — Other Ambulatory Visit: Payer: Self-pay

## 2021-07-15 ENCOUNTER — Observation Stay (HOSPITAL_COMMUNITY)
Admission: EM | Admit: 2021-07-15 | Discharge: 2021-07-16 | Disposition: A | Discharge status: Home / Self Care | Payer: PPO | Attending: Family Medicine | Admitting: Family Medicine

## 2021-07-15 ENCOUNTER — Encounter (HOSPITAL_COMMUNITY): Payer: Self-pay

## 2021-07-15 VITALS — BP 120/80 | HR 105 | Temp 97.6°F | Ht 64.0 in | Wt 166.4 lb

## 2021-07-15 DIAGNOSIS — E538 Deficiency of other specified B group vitamins: Secondary | ICD-10-CM

## 2021-07-15 DIAGNOSIS — E871 Hypo-osmolality and hyponatremia: Principal | ICD-10-CM | POA: Diagnosis present

## 2021-07-15 DIAGNOSIS — Z0000 Encounter for general adult medical examination without abnormal findings: Secondary | ICD-10-CM

## 2021-07-15 DIAGNOSIS — F319 Bipolar disorder, unspecified: Secondary | ICD-10-CM

## 2021-07-15 DIAGNOSIS — Z7901 Long term (current) use of anticoagulants: Secondary | ICD-10-CM | POA: Diagnosis not present

## 2021-07-15 DIAGNOSIS — Z124 Encounter for screening for malignant neoplasm of cervix: Secondary | ICD-10-CM

## 2021-07-15 DIAGNOSIS — Z13820 Encounter for screening for osteoporosis: Secondary | ICD-10-CM

## 2021-07-15 DIAGNOSIS — Z79899 Other long term (current) drug therapy: Secondary | ICD-10-CM | POA: Diagnosis not present

## 2021-07-15 DIAGNOSIS — Z01419 Encounter for gynecological examination (general) (routine) without abnormal findings: Secondary | ICD-10-CM | POA: Insufficient documentation

## 2021-07-15 DIAGNOSIS — I480 Paroxysmal atrial fibrillation: Secondary | ICD-10-CM | POA: Diagnosis not present

## 2021-07-15 DIAGNOSIS — I10 Essential (primary) hypertension: Secondary | ICD-10-CM | POA: Diagnosis not present

## 2021-07-15 DIAGNOSIS — Z1151 Encounter for screening for human papillomavirus (HPV): Secondary | ICD-10-CM | POA: Diagnosis not present

## 2021-07-15 DIAGNOSIS — Z20822 Contact with and (suspected) exposure to covid-19: Secondary | ICD-10-CM | POA: Insufficient documentation

## 2021-07-15 DIAGNOSIS — Z780 Asymptomatic menopausal state: Secondary | ICD-10-CM | POA: Diagnosis not present

## 2021-07-15 DIAGNOSIS — Z87891 Personal history of nicotine dependence: Secondary | ICD-10-CM | POA: Insufficient documentation

## 2021-07-15 HISTORY — DX: Anxiety disorder, unspecified: F41.9

## 2021-07-15 LAB — COMPREHENSIVE METABOLIC PANEL
ALT: 27 U/L (ref 0–35)
AST: 35 U/L (ref 0–37)
Albumin: 4.5 g/dL (ref 3.5–5.2)
Alkaline Phosphatase: 80 U/L (ref 39–117)
BUN: 12 mg/dL (ref 6–23)
CO2: 28 mEq/L (ref 19–32)
Calcium: 9.8 mg/dL (ref 8.4–10.5)
Chloride: 79 mEq/L — ABNORMAL LOW (ref 96–112)
Creatinine, Ser: 0.78 mg/dL (ref 0.40–1.20)
GFR: 77.62 mL/min (ref >60.00)
Glucose, Bld: 92 mg/dL (ref 70–99)
Potassium: 3.5 mEq/L (ref 3.5–5.1)
Sodium: 117 mEq/L — CL (ref 135–145)
Total Bilirubin: 1.5 mg/dL — ABNORMAL HIGH (ref 0.2–1.2)
Total Protein: 7.4 g/dL (ref 6.0–8.3)

## 2021-07-15 LAB — BASIC METABOLIC PANEL
Anion gap: 11 (ref 5–15)
Anion gap: 13 (ref 5–15)
BUN: 11 mg/dL (ref 8–23)
BUN: 10 mg/dL (ref 8–23)
CO2: 26 mmol/L (ref 22–32)
CO2: 23 mmol/L (ref 22–32)
Calcium: 10 mg/dL (ref 8.9–10.3)
Calcium: 9.8 mg/dL (ref 8.9–10.3)
Chloride: 80 mmol/L — ABNORMAL LOW (ref 98–111)
Chloride: 84 mmol/L — ABNORMAL LOW (ref 98–111)
Creatinine, Ser: 0.52 mg/dL (ref 0.44–1.00)
Creatinine, Ser: 0.53 mg/dL (ref 0.44–1.00)
GFR, Estimated: >60 mL/min (ref >60)
GFR, Estimated: >60 mL/min (ref >60)
Glucose, Bld: 90 mg/dL (ref 70–99)
Glucose, Bld: 95 mg/dL (ref 70–99)
Potassium: 3.4 mmol/L — ABNORMAL LOW (ref 3.5–5.1)
Potassium: 3.7 mmol/L (ref 3.5–5.1)
Sodium: 117 mmol/L — CL (ref 135–145)
Sodium: 120 mmol/L — ABNORMAL LOW (ref 135–145)

## 2021-07-15 LAB — LIPID PANEL
Cholesterol: 217 mg/dL — ABNORMAL HIGH (ref 0–200)
HDL: 152.1 mg/dL (ref >39.00)
LDL Cholesterol: 48 mg/dL (ref 0–99)
NonHDL: 64.45
Total CHOL/HDL Ratio: 1
Triglycerides: 82 mg/dL (ref 0.0–149.0)
VLDL: 16.4 mg/dL (ref 0.0–40.0)

## 2021-07-15 LAB — CBC WITH DIFFERENTIAL/PLATELET
Basophils Absolute: 0 10*3/uL (ref 0.0–0.1)
Basophils Relative: 0.8 % (ref 0.0–3.0)
Eosinophils Absolute: 0.1 10*3/uL (ref 0.0–0.7)
Eosinophils Relative: 0.9 % (ref 0.0–5.0)
HCT: 39.5 % (ref 36.0–46.0)
Hemoglobin: 13.5 g/dL (ref 12.0–15.0)
Lymphocytes Relative: 21.2 % (ref 12.0–46.0)
Lymphs Abs: 1.4 10*3/uL (ref 0.7–4.0)
MCHC: 34.1 g/dL (ref 30.0–36.0)
MCV: 95.4 fl (ref 78.0–100.0)
Monocytes Absolute: 0.8 10*3/uL (ref 0.1–1.0)
Monocytes Relative: 12.9 % — ABNORMAL HIGH (ref 3.0–12.0)
Neutro Abs: 4.1 10*3/uL (ref 1.4–7.7)
Neutrophils Relative %: 64.2 % (ref 43.0–77.0)
Platelets: 250 10*3/uL (ref 150.0–400.0)
RBC: 4.14 Mil/uL (ref 3.87–5.11)
RDW: 13.3 % (ref 11.5–15.5)
WBC: 6.4 10*3/uL (ref 4.0–10.5)

## 2021-07-15 LAB — URINALYSIS, ROUTINE W REFLEX MICROSCOPIC
Bilirubin Urine: NEGATIVE
Glucose, UA: NEGATIVE mg/dL
Ketones, ur: NEGATIVE mg/dL
Nitrite: NEGATIVE
Protein, ur: NEGATIVE mg/dL
Specific Gravity, Urine: 1.002 — ABNORMAL LOW (ref 1.005–1.030)
pH: 7 (ref 5.0–8.0)

## 2021-07-15 LAB — MAGNESIUM: Magnesium: 2 mg/dL (ref 1.7–2.4)

## 2021-07-15 LAB — HEMOGLOBIN A1C: Hgb A1c MFr Bld: 5.1 % (ref 4.6–6.5)

## 2021-07-15 LAB — RESP PANEL BY RT-PCR (FLU A&B, COVID) ARPGX2
Influenza A by PCR: NEGATIVE
Influenza B by PCR: NEGATIVE
SARS Coronavirus 2 by RT PCR: NEGATIVE

## 2021-07-15 LAB — SODIUM, URINE, RANDOM: Sodium, Ur: 17 mmol/L

## 2021-07-15 LAB — TSH
TSH: 2.46 u[IU]/mL (ref 0.35–5.50)
TSH: 1.998 u[IU]/mL (ref 0.350–4.500)

## 2021-07-15 LAB — PHOSPHORUS: Phosphorus: 3.9 mg/dL (ref 2.5–4.6)

## 2021-07-15 LAB — VITAMIN D 25 HYDROXY (VIT D DEFICIENCY, FRACTURES): VITD: 55.77 ng/mL (ref 30.00–100.00)

## 2021-07-15 LAB — VITAMIN B12: Vitamin B-12: 503 pg/mL (ref 211–911)

## 2021-07-15 MED ORDER — CALCIUM CARBONATE 1250 (500 CA) MG PO TABS
1200.0000 mg | ORAL_TABLET | Freq: Every day | ORAL | Status: DC
  Administered 2021-07-15: 1250 mg via ORAL
  Filled 2021-07-15 (×2): qty 1

## 2021-07-15 MED ORDER — APIXABAN 5 MG PO TABS
5.0000 mg | ORAL_TABLET | Freq: Two times a day (BID) | ORAL | Status: DC
  Administered 2021-07-15 – 2021-07-16 (×2): 5 mg via ORAL
  Filled 2021-07-15 (×2): qty 1

## 2021-07-15 MED ORDER — VITAMIN D 25 MCG (1000 UNIT) PO TABS
1000.0000 [IU] | ORAL_TABLET | Freq: Every day | ORAL | Status: DC
  Administered 2021-07-15: 1000 [IU] via ORAL
  Filled 2021-07-15: qty 1

## 2021-07-15 MED ORDER — ALPRAZOLAM 1 MG PO TABS
1.0000 mg | ORAL_TABLET | Freq: Every day | ORAL | Status: DC
  Administered 2021-07-16: 1 mg via ORAL
  Filled 2021-07-15 (×2): qty 1

## 2021-07-15 MED ORDER — BUSPIRONE HCL 5 MG PO TABS
15.0000 mg | ORAL_TABLET | Freq: Every day | ORAL | Status: DC
  Administered 2021-07-16: 15 mg via ORAL
  Filled 2021-07-15: qty 3

## 2021-07-15 MED ORDER — FOLIC ACID 1 MG PO TABS
1.0000 mg | ORAL_TABLET | Freq: Every day | ORAL | Status: DC
  Administered 2021-07-15 – 2021-07-16 (×2): 1 mg via ORAL
  Filled 2021-07-15 (×2): qty 1

## 2021-07-15 MED ORDER — ASCORBIC ACID 500 MG PO TABS
1000.0000 mg | ORAL_TABLET | Freq: Every day | ORAL | Status: DC
  Administered 2021-07-15: 1000 mg via ORAL
  Filled 2021-07-15: qty 2

## 2021-07-15 MED ORDER — POTASSIUM CHLORIDE CRYS ER 20 MEQ PO TBCR
40.0000 meq | EXTENDED_RELEASE_TABLET | Freq: Every day | ORAL | Status: AC
  Administered 2021-07-15 – 2021-07-16 (×2): 40 meq via ORAL
  Filled 2021-07-15 (×2): qty 2

## 2021-07-15 MED ORDER — LORAZEPAM 1 MG PO TABS: 1.0000 mg | ORAL_TABLET | ORAL | Status: DC | PRN

## 2021-07-15 MED ORDER — ACETAMINOPHEN 325 MG PO TABS
650.0000 mg | ORAL_TABLET | Freq: Four times a day (QID) | ORAL | Status: DC | PRN
  Administered 2021-07-16: 650 mg via ORAL
  Filled 2021-07-15: qty 2

## 2021-07-15 MED ORDER — THIAMINE HCL 100 MG PO TABS
100.0000 mg | ORAL_TABLET | Freq: Every day | ORAL | Status: DC
  Administered 2021-07-15 – 2021-07-16 (×2): 100 mg via ORAL
  Filled 2021-07-15 (×2): qty 1

## 2021-07-15 MED ORDER — DILTIAZEM HCL ER 90 MG PO CP12
90.0000 mg | ORAL_CAPSULE | Freq: Two times a day (BID) | ORAL | Status: DC
  Administered 2021-07-15 – 2021-07-16 (×2): 90 mg via ORAL
  Filled 2021-07-15 (×2): qty 1

## 2021-07-15 MED ORDER — LORAZEPAM 2 MG/ML IJ SOLN: 1.0000 mg | INTRAMUSCULAR | Status: DC | PRN

## 2021-07-15 MED ORDER — ACETAMINOPHEN 650 MG RE SUPP: 650.0000 mg | Freq: Four times a day (QID) | RECTAL | Status: DC | PRN

## 2021-07-15 MED ORDER — ONDANSETRON HCL 4 MG PO TABS: 4.0000 mg | ORAL_TABLET | Freq: Four times a day (QID) | ORAL | Status: DC | PRN

## 2021-07-15 MED ORDER — SODIUM CHLORIDE 0.9 % IV SOLN: INTRAVENOUS | Status: DC

## 2021-07-15 MED ORDER — ALPRAZOLAM 1 MG PO TABS
1.0000 mg | ORAL_TABLET | Freq: Every day | ORAL | Status: DC | PRN
  Administered 2021-07-16: 1 mg via ORAL

## 2021-07-15 MED ORDER — THIAMINE HCL 100 MG/ML IJ SOLN: 100.0000 mg | Freq: Every day | INTRAMUSCULAR | Status: DC

## 2021-07-15 MED ORDER — LAMOTRIGINE 100 MG PO TABS
300.0000 mg | ORAL_TABLET | Freq: Every day | ORAL | Status: DC
  Administered 2021-07-15: 300 mg via ORAL
  Filled 2021-07-15: qty 3

## 2021-07-15 MED ORDER — ADULT MULTIVITAMIN W/MINERALS CH
1.0000 | ORAL_TABLET | Freq: Every day | ORAL | Status: DC
  Administered 2021-07-15 – 2021-07-16 (×2): 1 via ORAL
  Filled 2021-07-15 (×2): qty 1

## 2021-07-15 MED ORDER — ONDANSETRON HCL 4 MG/2ML IJ SOLN: 4.0000 mg | Freq: Four times a day (QID) | INTRAMUSCULAR | Status: DC | PRN

## 2021-07-15 NOTE — ED Triage Notes (Signed)
Patient went for her annual  physical today and called and told to come to the ED for sodium level -117.

## 2021-07-15 NOTE — ED Provider Notes (Signed)
[facility] DEPT Provider Note   CSN: [id number] Arrival date & time: 07/15/21  1219     History Chief Complaint  Patient presents with   Abnormal Lab    Margaret Mckenzie is a 70 y.o. female.   Abnormal Lab Patient presents after an incidental finding of hyponatremia at her PCPs office.  Sodium, at that time, was found to be 117.  Patient denies any recent symptoms.  She has been living her normal life, including going to work.  She does state that she had a vertiginous spell while at work yesterday.  She reports that she does get these from time to time.  Yesterday's episode was consistent with her prior episodes of vertigo, however, it was more severe.  It did cause the patient to leave work and go home.  She went home and took a nap.  She has felt normal since.  Patient states that she typically drinks lots of water but has not been drinking any more than usual.  She has not started any new medications.  She has not had any changes in her urination recently.     Past Medical History:  Diagnosis Date   Anxiety    Bipolar 1 disorder ([place])    Chicken pox    Depression    Hypertension    high blood pressure readings    Kidney disease    kidney stones and saw urologist, Dr. Edward Qualia for lithotripsy remotely    PAF (paroxysmal atrial fibrillation) [facility])     Patient Active Problem List   Diagnosis Date Noted   Hyponatremia 07/15/2021   Generalized anxiety disorder 10/25/2020   Panic disorder 10/25/2020   Bipolar I disorder ([place]) 10/25/2020   Insomnia 10/25/2020   Vitamin B12 deficiency 04/23/2020   Paroxysmal atrial fibrillation ([place]) 08/31/2017   Essential hypertension 08/31/2017    Past Surgical History:  Procedure Laterality Date   BUNIONECTOMY  2007   [place]     OB History   No obstetric history on file.     Family History  Problem Relation Age of Onset   Osteoporosis Mother    Stroke  Mother        TIA, stoke   Mental retardation Mother    Hypertension Father    Heart disease Father 34   Stroke Father 68   Mental illness Sister    Breast cancer Neg Hx     Social History   Tobacco Use   Smoking status: Former    Packs/day: 0.50    Types: Cigarettes    Quit date: 07/11/2017    Years since quitting: 4.0   Smokeless tobacco: Never   Tobacco comments:    pt has quit smoking  Vaping Use   Vaping Use: Every day   Substances: Nicotine, Flavoring  Substance Use Topics   Alcohol use: Yes    Alcohol/week: 7.0 standard drinks    Types: 7 Glasses of wine per week    Comment: "2 to 3 glasses wine per day" - 07/15/21   Drug use: No    Home Medications Prior to Admission medications   Medication Sig Start Date End Date Taking? Authorizing Provider  acetaminophen (TYLENOL) 500 MG tablet Take 2 tablets (1,000 mg total) by mouth every 8 (eight) hours as needed. Patient taking differently: Take 1,000 mg by mouth every 8 (eight) hours as needed for mild pain (or headaches). 07/11/20  Yes Darr, Edison Nasuti, PA-C  ALPRAZolam Duanne Moron) 1 MG tablet  Take 1 tablet (1 mg total) by mouth 2 (two) times daily as needed for anxiety. Patient taking differently: Take 1 mg by mouth See admin instructions. Take 1 mg by mouth in the morning and an additional 1 mg once a day as needed for anxiety 04/26/21  Yes Hurst, Teresa T, PA-C  apixaban (ELIQUIS) 5 MG TABS tablet Take 1 tablet (5 mg total) by mouth 2 (two) times daily. 10/12/20  Yes Minus Breeding, MD  Ascorbic Acid (VITAMIN C) 1000 MG tablet Take 1,000 mg by mouth at bedtime.   Yes [provider]  busPIRone (BUSPAR) 15 MG tablet TAKE 1 TABLET BY MOUTH EVERY DAY Patient taking differently: Take 15 mg by mouth in the morning. 12/07/20  Yes Donnal Moat T, PA-C  Calcium Carbonate (CALCIUM 600 PO) Take 1,200 mg by mouth at bedtime.   Yes [provider]  Cholecalciferol (VITAMIN D-3) 25 MCG (1000 UT) CAPS Take 1,000 Units by mouth  at bedtime.   Yes [provider]  cyanocobalamin (,VITAMIN B-12,) 1000 MCG/ML injection INJECT 1 ML (1,000 MCG TOTAL) INTO THE MUSCLE EVERY 30 DAYS. Patient taking differently: Inject 1,000 mcg into the muscle every 30 (thirty) days. 05/12/21  Yes Isaac Bliss, Rayford Halsted, MD  dexamethasone 0.5 MG/5ML elixir 0.5 mg See admin instructions. Rinse mouth four times a day as directed with 0.5 mg (5 ml's) for 2 minutes, then expectorate   Yes [provider]  diltiazem (CARDIZEM) 30 MG tablet Take 1 tablet every 4 hours AS NEEDED for AFIB heart rate >100 Patient taking differently: Take 30 mg by mouth every 4 (four) hours as needed (for A-Fib heart rate >100). 12/21/20  Yes Minus Breeding, MD  fluocinonide gel (LIDEX) [id number] % Apply 1 application topically See admin instructions. Apply to the affected area 4 times a day   Yes [provider]  lamoTRIgine (LAMICTAL) 150 MG tablet TAKE 2 TABLETS BY MOUTH EVERY DAY AT BEDTIME Patient taking differently: Take 300 mg by mouth at bedtime. 04/26/21  Yes Hurst, Dorothea Glassman, PA-C  lisinopril-hydrochlorothiazide (ZESTORETIC) 20-25 MG tablet TAKE 1 TABLET BY MOUTH EVERY DAY Patient taking differently: Take 1 tablet by mouth in the morning. 07/07/21  Yes Minus Breeding, MD  Magnesium 500 MG TABS Take 500 mg by mouth at bedtime.   Yes [provider]  risperiDONE (RISPERDAL) 3 MG tablet Take 1 tablet (3 mg total) by mouth at bedtime. 04/26/21  Yes Hurst, Helene Kelp T, PA-C  traZODone (DESYREL) 50 MG tablet TAKE 1 TO 2 TABLETS BY MOUTH AT BEDTIME AS NEEDED FOR SLEEP Patient taking differently: Take 25 mg by mouth at bedtime as needed for sleep. 05/03/21  Yes Donnal Moat T, PA-C  triamcinolone (KENALOG) 0.1 % Apply 1 application topically 2 (two) times daily. Patient taking differently: Apply 1 application topically 2 (two) times daily as needed (for itching). 12/08/20  Yes Isaac Bliss, Rayford Halsted, MD  SYRINGE-NEEDLE, DISP, 3 ML (BD ECLIPSE  SYRINGE) 25G X 5/8" 3 ML MISC Use as directed 04/27/20   Isaac Bliss, Rayford Halsted, MD    Allergies    Trazodone and nefazodone and Soap  Review of Systems   Review of Systems  Constitutional:  Negative for activity change, appetite change, chills, fatigue and fever.  HENT:  Negative for congestion, ear pain, rhinorrhea, sore throat and trouble swallowing.   Eyes:  Negative for pain and visual disturbance.  Respiratory:  Negative for cough, chest tightness and shortness of breath.   Cardiovascular:  Negative for  chest pain, palpitations and leg swelling.  Gastrointestinal:  Negative for abdominal distention, abdominal pain, diarrhea, nausea and vomiting.  Endocrine: Negative for polyphagia and polyuria.  Genitourinary:  Negative for dysuria, flank pain, frequency, hematuria, pelvic pain and urgency.  Musculoskeletal:  Negative for arthralgias, back pain, myalgias and neck pain.  Skin:  Negative for color change, pallor, rash and wound.  Neurological:  Positive for dizziness (Transient episode yesterday). Negative for seizures, syncope, weakness, light-headedness, numbness and headaches.  Psychiatric/Behavioral:  Negative for confusion and decreased concentration.   All other systems reviewed and are negative.  Physical Exam Updated Vital Signs BP (!) 146/95 (BP Location: Right Arm)   Pulse (!) 110   Temp 97.8 F (36.6 C)   Resp 20   Ht '5\' 4"'$  (1.626 m)   Wt 75.5 kg   SpO2 98%   BMI 28.56 kg/m   Physical Exam Vitals and nursing note reviewed.  Constitutional:      General: She is not in acute distress.    Appearance: Normal appearance. She is well-developed and normal weight. She is not ill-appearing, toxic-appearing or diaphoretic.  HENT:     Head: Normocephalic and atraumatic.     Right Ear: External ear normal.     Left Ear: External ear normal.     Nose: Nose normal.     Mouth/Throat:     Mouth: Mucous membranes are moist.     Pharynx: Oropharynx is clear.  Eyes:      Extraocular Movements: Extraocular movements intact.     Conjunctiva/sclera: Conjunctivae normal.  Cardiovascular:     Rate and Rhythm: Normal rate and regular rhythm.     Heart sounds: No murmur heard. Pulmonary:     Effort: Pulmonary effort is normal. No respiratory distress.     Breath sounds: Normal breath sounds. No wheezing or rales.  Chest:     Chest wall: No tenderness.  Abdominal:     Palpations: Abdomen is soft.     Tenderness: There is no abdominal tenderness. There is no right CVA tenderness or left CVA tenderness.  Musculoskeletal:     Cervical back: Normal range of motion and neck supple.  Skin:    General: Skin is warm and dry.  Neurological:     General: No focal deficit present.     Mental Status: She is alert.     Cranial Nerves: No cranial nerve deficit, dysarthria or facial asymmetry.     Sensory: Sensation is intact. No sensory deficit.     Motor: Tremor (Patient attributes it to shiver from feeling cold.) present. No weakness, abnormal muscle tone or pronator drift.     Coordination: Coordination is intact. Romberg sign negative. Coordination normal.  Psychiatric:        Mood and Affect: Mood normal.        Behavior: Behavior normal.        Thought Content: Thought content normal.        Judgment: Judgment normal.    ED Results / Procedures / Treatments   Labs (all labs ordered are listed, but only abnormal results are displayed) Labs Reviewed  BASIC METABOLIC PANEL - Abnormal; Notable for the following components:      Result Value   Sodium 117 (*)    Potassium 3.4 (*)    Chloride 80 (*)    All other components within normal limits  URINALYSIS, ROUTINE W REFLEX MICROSCOPIC - Abnormal; Notable for the following components:   Color, Urine STRAW (*)  Specific Gravity, Urine 1.002 (*)    Hgb urine dipstick SMALL (*)    Leukocytes,Ua MODERATE (*)    Bacteria, UA MANY (*)    All other components within normal limits  RESP PANEL BY RT-PCR (FLU A&B,  COVID) ARPGX2  MAGNESIUM  PHOSPHORUS  TSH  SODIUM, URINE, RANDOM  OSMOLALITY  OSMOLALITY, URINE  BASIC METABOLIC PANEL  BASIC METABOLIC PANEL  HIV ANTIBODY (ROUTINE TESTING W REFLEX)  CBC  BASIC METABOLIC PANEL    EKG None  Radiology No results found.  Procedures Procedures   Medications Ordered in ED Medications  0.9 %  sodium chloride infusion ( Intravenous New Bag/Given 07/15/21 1922)  ALPRAZolam Duanne Moron) tablet 1 mg (has no administration in time range)  busPIRone (BUSPAR) tablet 15 mg (has no administration in time range)  apixaban (ELIQUIS) tablet 5 mg (has no administration in time range)  lamoTRIgine (LAMICTAL) tablet 300 mg (has no administration in time range)  ascorbic acid (VITAMIN C) tablet 1,000 mg (has no administration in time range)  calcium carbonate (OS-CAL - dosed in mg of elemental calcium) tablet 1,250 mg (has no administration in time range)  cholecalciferol (VITAMIN D3) tablet 1,000 Units (has no administration in time range)  acetaminophen (TYLENOL) tablet 650 mg (has no administration in time range)    Or  acetaminophen (TYLENOL) suppository 650 mg (has no administration in time range)  ondansetron (ZOFRAN) tablet 4 mg (has no administration in time range)    Or  ondansetron (ZOFRAN) injection 4 mg (has no administration in time range)  potassium chloride SA (KLOR-CON) CR tablet 40 mEq (has no administration in time range)  diltiazem (CARDIZEM SR) 12 hr capsule 90 mg (has no administration in time range)  LORazepam (ATIVAN) tablet 1-4 mg (has no administration in time range)    Or  LORazepam (ATIVAN) injection 1-4 mg (has no administration in time range)  thiamine tablet 100 mg (has no administration in time range)    Or  thiamine (B-1) injection 100 mg (has no administration in time range)  folic acid (FOLVITE) tablet 1 mg (has no administration in time range)  multivitamin with minerals tablet 1 tablet (has no administration in time range)   ALPRAZolam (XANAX) tablet 1 mg (has no administration in time range)    ED Course  I have reviewed the triage vital signs and the nursing notes.  Pertinent labs & imaging results that were available during my care of the patient were reviewed by me and considered in my medical decision making (see chart for details).    MDM Rules/Calculators/A&P                           Patient is a 70 year old female presenting for incidental finding of hyponatremia.  Patient denies any recent symptoms other than a episode of vertigo yesterday that she attributes to her chronic intermittent vertigo.  On exam, patient is well-appearing.  She has no focal neurologic deficits.  She does have a mild tremor in her bilateral upper extremities.  She denies a chronic tremor and states that her hands are shaking because she feels cold.  Repeat BMP was ordered which confirmed sodium of 117.  Patient was instructed to not drink anything for fluid restriction as the initial management.  Given no current symptoms, no hypertonic saline indicated at this time.  Given patient's euvolemic state, will avoid isotonic IVF.  Additional diagnostic lab work was ordered.  Patient was admitted to hospitalist  for further management.  Final Clinical Impression(s) / ED Diagnoses Final diagnoses:  Hyponatremia    Rx / DC Orders ED Discharge Orders     None        Godfrey Pick, MD 07/15/21 2007

## 2021-07-15 NOTE — ED Provider Notes (Signed)
Emergency Medicine Provider Triage Evaluation Note  Margaret Mckenzie , a 70 y.o. female  was evaluated in triage.  Was seen today at PCP and lab work showed sodium of 117, was sent to ED for evaluation. History of labyrinthitis, some increased dizziness that caused her to leave work earlier this week. Has been told her sodium has been low before but never prescribed supplementation.  Review of Systems  Positive: dizziness Negative: Syncope, weakness, seizures, chest pain, SOB, N/V  Physical Exam  BP (!) 158/92 (BP Location: Right Arm)   Pulse (!) 108   Temp 98.3 F (36.8 C) (Oral)   Resp 16   Ht '5\' 4"'$  (1.626 m)   Wt 75.5 kg   SpO2 98%   BMI 28.56 kg/m  Gen:   Awake, no distress   Resp:  Normal effort  MSK:   Moves extremities without difficulty  Other:    Medical Decision Making  Medically screening exam initiated at 1:16 PM.  Appropriate orders placed.  [name] was informed that the remainder of the evaluation will be completed by another provider, this initial triage assessment does not replace that evaluation, and the importance of remaining in the ED until their evaluation is complete.     [name] 07/15/21 1316    Godfrey Pick, MD 07/15/21 2008

## 2021-07-15 NOTE — Progress Notes (Signed)
Established Patient Office Visit     This visit occurred during the SARS-CoV-2 public health emergency.  Safety protocols were in place, including screening questions prior to the visit, additional usage of staff PPE, and extensive cleaning of exam room while observing appropriate contact time as indicated for disinfecting solutions.    CC/Reason for Visit: Annual preventive exam and subsequent Medicare wellness visit  HPI: Margaret Mckenzie is a 70 y.o. female who is coming in today for the above mentioned reasons. Past Medical History is significant for: Hypertension, paroxysmal atrial fibrillation and bipolar disorder.  She has been doing well and has no acute concerns today.  She suffered a left hand fracture in August and has healed well from it after splinting and physical therapy.  She is overdue for DEXA scan.  All immunizations are up-to-date.  She had a colonoscopy in 2019, she had a negative mammogram earlier this month.  She is due for Pap smear.   Past Medical/Surgical History: Past Medical History:  Diagnosis Date   Bipolar 1 disorder ([place])    Chicken pox    Hypertension    high blood pressure readings    Kidney disease    kidney stones and saw urologist, Dr. Edward Qualia for lithotripsy remotely    PAF (paroxysmal atrial fibrillation) [facility])     Past Surgical History:  Procedure Laterality Date   BUNIONECTOMY  2007   [place]    Social History:  reports that she quit smoking about 4 years ago. Her smoking use included cigarettes. She smoked an average of .5 packs per day. She has never used smokeless tobacco. She reports current alcohol use of about 7.0 standard drinks per week. She reports that she does not use drugs.  Allergies: No Known Allergies  Family History:  Family History  Problem Relation Age of Onset   Osteoporosis Mother    Stroke Mother        TIA, stoke   Mental retardation Mother    Hypertension Father     Heart disease Father 29   Stroke Father 23   Mental illness Sister    Breast cancer Neg Hx      Current Outpatient Medications:    acetaminophen (TYLENOL) 500 MG tablet, Take 2 tablets (1,000 mg total) by mouth every 8 (eight) hours as needed., Disp: 30 tablet, Rfl: 0   ALPRAZolam (XANAX) 1 MG tablet, Take 1 tablet (1 mg total) by mouth 2 (two) times daily as needed for anxiety., Disp: 60 tablet, Rfl: 5   apixaban (ELIQUIS) 5 MG TABS tablet, Take 1 tablet (5 mg total) by mouth 2 (two) times daily., Disp: 180 tablet, Rfl: 3   Ascorbic Acid (VITAMIN C) 1000 MG tablet, Take 1,000 mg by mouth daily., Disp: , Rfl:    busPIRone (BUSPAR) 15 MG tablet, TAKE 1 TABLET BY MOUTH EVERY DAY, Disp: 90 tablet, Rfl: 3   Calcium Carbonate-Vit D-Min (CALCIUM 1200 PO), Take by mouth., Disp: , Rfl:    cholecalciferol (VITAMIN D) 1000 units tablet, Take 1,000 Units by mouth daily., Disp: , Rfl:    cyanocobalamin (,VITAMIN B-12,) 1000 MCG/ML injection, INJECT 1 ML (1,000 MCG TOTAL) INTO THE MUSCLE EVERY 30 DAYS., Disp: 3 mL, Rfl: 3   diltiazem (CARDIZEM) 30 MG tablet, Take 1 tablet every 4 hours AS NEEDED for AFIB heart rate >100, Disp: 45 tablet, Rfl: 1   lamoTRIgine (LAMICTAL) 150 MG tablet, TAKE 2 TABLETS BY MOUTH EVERY DAY AT BEDTIME, Disp:  180 tablet, Rfl: 2   lisinopril-hydrochlorothiazide (ZESTORETIC) 20-25 MG tablet, TAKE 1 TABLET BY MOUTH EVERY DAY, Disp: 90 tablet, Rfl: 1   Magnesium 500 MG TABS, Take 1 tablet by mouth at bedtime., Disp: , Rfl:    risperiDONE (RISPERDAL) 3 MG tablet, Take 1 tablet (3 mg total) by mouth at bedtime., Disp: 90 tablet, Rfl: 2   SYRINGE-NEEDLE, DISP, 3 ML (BD ECLIPSE SYRINGE) 25G X 5/8" 3 ML MISC, Use as directed, Disp: 50 each, Rfl: 0   triamcinolone (KENALOG) 0.1 %, Apply 1 application topically 2 (two) times daily., Disp: 30 g, Rfl: 0   traZODone (DESYREL) 50 MG tablet, TAKE 1 TO 2 TABLETS BY MOUTH AT BEDTIME AS NEEDED FOR SLEEP (Patient not taking: Reported on  07/15/2021), Disp: 180 tablet, Rfl: 0  Current Facility-Administered Medications:    0.9 %  sodium chloride infusion, 500 mL, Intravenous, Once, Gatha Mayer, MD  Review of Systems:  Constitutional: Denies fever, chills, diaphoresis, appetite change and fatigue.  HEENT: Denies photophobia, eye pain, redness, hearing loss, ear pain, congestion, sore throat, rhinorrhea, sneezing, mouth sores, trouble swallowing, neck pain, neck stiffness and tinnitus.   Respiratory: Denies SOB, DOE, cough, chest tightness,  and wheezing.   Cardiovascular: Denies chest pain, palpitations and leg swelling.  Gastrointestinal: Denies nausea, vomiting, abdominal pain, diarrhea, constipation, blood in stool and abdominal distention.  Genitourinary: Denies dysuria, urgency, frequency, hematuria, flank pain and difficulty urinating.  Endocrine: Denies: hot or cold intolerance, sweats, changes in hair or nails, polyuria, polydipsia. Musculoskeletal: Denies myalgias, back pain, joint swelling, arthralgias and gait problem.  Skin: Denies pallor, rash and wound.  Neurological: Denies dizziness, seizures, syncope, weakness, light-headedness, numbness and headaches.  Hematological: Denies adenopathy. Easy bruising, personal or family bleeding history  Psychiatric/Behavioral: Denies suicidal ideation, mood changes, confusion, nervousness, sleep disturbance and agitation    Physical Exam: Vitals:   07/15/21 0825  BP: 120/80  Pulse: (!) 105  Temp: 97.6 F (36.4 C)  TempSrc: Oral  SpO2: 98%  Weight: 166 lb 6.4 oz (75.5 kg)  Height: '5\' 4"'$  (1.626 m)    Body mass index is 28.56 kg/m.   Constitutional: NAD, calm, comfortable Eyes: PERRL, lids and conjunctivae normal, wears corrective lenses ENMT: Mucous membranes are moist. Posterior pharynx clear of any exudate or lesions. Normal dentition. Tympanic membrane is pearly white, no erythema or bulging. Neck: normal, supple, no masses, no thyromegaly Respiratory:  clear to auscultation bilaterally, no wheezing, no crackles. Normal respiratory effort. No accessory muscle use.  Cardiovascular: Regular rate irregular rhythm, no murmurs / rubs / gallops. No extremity edema. 2+ pedal pulses. No carotid bruits.  Abdomen: no tenderness, no masses palpated. No hepatosplenomegaly. Bowel sounds positive.  Musculoskeletal: no clubbing / cyanosis. No joint deformity upper and lower extremities. Good ROM, no contractures. Normal muscle tone.  Skin: no rashes, lesions, ulcers. No induration Neurologic: CN 2-12 grossly intact. Sensation intact, DTR normal. Strength 5/5 in all 4.  Psychiatric: Normal judgment and insight. Alert and oriented x 3. Normal mood.    Subsequent Medicare wellness visit   1. Risk factors, based on past  M,S,F -cardiovascular disease risk factors include age, history of hypertension   2.  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  Depression/mood: Has a history of bipolar disorder but mood is stable today.   4.  Hearing: No perceived issues   5.  ADL's: Independent in all ADLs   6.  Fall risk: Low  fall risk   7.  Home safety: No problems identified   8.  Height weight, and visual acuity: height and weight as above, vision:  Vision Screening   Right eye Left eye Both eyes  Without correction     With correction '20/32 20/32 20/32 '$     9.  Counseling: Advise she update her DEXA scan and her flu vaccine once available   10. Lab orders based on risk factors: Laboratory update will be reviewed   11. Referral : None today   12. Care plan: Follow-up with me in 1 year or sooner as needed   13. Cognitive assessment: No cognitive impairment   14. Screening: Patient provided with a written and personalized 5-10 year screening schedule in the AVS. yes   15. Provider List Update: PCP, cardiologist  16. Advance Directives: Full code   17. Opioids: Patient is not on any opioid prescriptions and has no  risk factors for a substance use disorder.   [place] Visit from 07/15/2021 in [place] at [place]  PHQ-9 Total Score 0       Fall Risk  07/15/2021 04/01/2020 09/10/2018 09/03/2017 08/04/2013  Falls in the past year? 0 0 No No No  Number falls in past yr: 0 0 - - -  Injury with Fall? 0 0 - - -     Impression and Plan:  Encounter for preventive health examination -Advised routine eye and dental care. -All immunizations are up-to-date. -Labs will be updated today. -Healthy lifestyle discussed in detail. -She had a colonoscopy in 2019 and is a 10-year callback. -Pap smear done in office today. -She had a negative mammogram earlier this month. -DEXA scan will be requested.  Encounter for osteoporosis screening in asymptomatic postmenopausal patient  - Plan: DG Bone Density  Essential hypertension  - Plan: CBC with Differential/Platelet, Comprehensive metabolic panel, Hemoglobin A1c, Lipid panel -Blood pressure is well controlled.  Vitamin B12 deficiency  - Plan: Vitamin B12  Bipolar I disorder ([place])  -Mood is stable, followed by psychiatry  Paroxysmal atrial fibrillation ([place]) -Rate controlled, anticoagulated on Eliquis, followed by cardiology.   Patient Instructions  -Nice seeing you today!!  -Lab work today; will notify you once results are available.  -Schedule follow up in 1 year or sooner as needed.      [name], MD [facility]

## 2021-07-15 NOTE — H&P (Signed)
History and Physical    Margaret Mckenzie [id number] DOB: Sep 10, 1951 DOA: 07/15/2021  PCP: Isaac Bliss, Rayford Halsted, MD  Patient coming from: Home  Chief Complaint: Abnormal lab values  HPI: Margaret Mckenzie is a 70 y.o. female with medical history significant of anxiety/depression, a fib on eliquis, HTN. Presenting with abnormal lab values. She was seen by her PCP today for her annual physical. She states she was in her normal state of health. Her only concern was her chronic dizziness. She had lab work drawn. The results showed a critical low sodium. Her PCP office called and recommended that she come to the ED for evaluation. She denies any other aggravating or alleviating factors.    ED Course: Her Na+ was confirmed to be 117. Urine studies were ordered. TSH was ordered. TRH was called for admission.   Review of Systems:  Denies CP, dyspnea, palpitations, abdominal pain, N/V/D, fever, polydipsia, polyuria, weight gain. Review of systems is otherwise negative for all not mentioned in HPI.   PMHx Past Medical History:  Diagnosis Date   Bipolar 1 disorder ([place])    Chicken pox    Hypertension    high blood pressure readings    Kidney disease    kidney stones and saw urologist, Dr. Edward Qualia for lithotripsy remotely    PAF (paroxysmal atrial fibrillation) [facility])     PSHx Past Surgical History:  Procedure Laterality Date   BUNIONECTOMY  2007   [place]    SocHx  reports that she quit smoking about 4 years ago. Her smoking use included cigarettes. She smoked an average of .5 packs per day. She has never used smokeless tobacco. She reports current alcohol use of about 7.0 standard drinks per week. She reports that she does not use drugs.  No Known Allergies  FamHx Family History  Problem Relation Age of Onset   Osteoporosis Mother    Stroke Mother        TIA, stoke   Mental retardation Mother    Hypertension Father    Heart  disease Father 21   Stroke Father 24   Mental illness Sister    Breast cancer Neg Hx     Prior to Admission medications   Medication Sig Start Date End Date Taking? Authorizing Provider  acetaminophen (TYLENOL) 500 MG tablet Take 2 tablets (1,000 mg total) by mouth every 8 (eight) hours as needed. 07/11/20  Yes Darr, Edison Nasuti, PA-C  ALPRAZolam Duanne Moron) 1 MG tablet Take 1 tablet (1 mg total) by mouth 2 (two) times daily as needed for anxiety. 04/26/21  Yes Donnal Moat T, PA-C  apixaban (ELIQUIS) 5 MG TABS tablet Take 1 tablet (5 mg total) by mouth 2 (two) times daily. 10/12/20  Yes Minus Breeding, MD  dexamethasone 0.5 MG/5ML elixir Take by mouth daily.   Yes [provider]  Ascorbic Acid (VITAMIN C) 1000 MG tablet Take 1,000 mg by mouth daily.    [provider]  busPIRone (BUSPAR) 15 MG tablet TAKE 1 TABLET BY MOUTH EVERY DAY 12/07/20   Donnal Moat T, PA-C  Calcium Carbonate-Vit D-Min (CALCIUM 1200 PO) Take by mouth.    [provider]  cholecalciferol (VITAMIN D) 1000 units tablet Take 1,000 Units by mouth daily.    [provider]  cyanocobalamin (,VITAMIN B-12,) 1000 MCG/ML injection INJECT 1 ML (1,000 MCG TOTAL) INTO THE MUSCLE EVERY 30 DAYS. 05/12/21   Isaac Bliss, Rayford Halsted, MD  diltiazem (CARDIZEM) 30 MG tablet Take 1  tablet every 4 hours AS NEEDED for AFIB heart rate >100 12/21/20   Minus Breeding, MD  lamoTRIgine (LAMICTAL) 150 MG tablet TAKE 2 TABLETS BY MOUTH EVERY DAY AT BEDTIME 04/26/21   Hurst, Helene Kelp T, PA-C  lisinopril-hydrochlorothiazide (ZESTORETIC) 20-25 MG tablet TAKE 1 TABLET BY MOUTH EVERY DAY 07/07/21   Minus Breeding, MD  Magnesium 500 MG TABS Take 1 tablet by mouth at bedtime.    [provider]  risperiDONE (RISPERDAL) 3 MG tablet Take 1 tablet (3 mg total) by mouth at bedtime. 04/26/21   Donnal Moat T, PA-C  SYRINGE-NEEDLE, DISP, 3 ML (BD ECLIPSE SYRINGE) 25G X 5/8" 3 ML MISC Use as directed 04/27/20   Isaac Bliss,  Rayford Halsted, MD  traZODone (DESYREL) 50 MG tablet TAKE 1 TO 2 TABLETS BY MOUTH AT BEDTIME AS NEEDED FOR SLEEP Patient not taking: Reported on 07/15/2021 05/03/21   Donnal Moat T, PA-C  triamcinolone (KENALOG) 0.1 % Apply 1 application topically 2 (two) times daily. 12/08/20   Isaac Bliss, Rayford Halsted, MD    Physical Exam: Vitals:   07/15/21 1227 07/15/21 1250 07/15/21 1508  BP: (!) 158/92  (!) 161/112  Pulse: (!) 108  90  Resp: 16  20  Temp: 98.3 F (36.8 C)    TempSrc: Oral    SpO2: 98%  100%  Weight:  75.5 kg   Height:  '5\' 4"'$  (1.626 m)     General: 70 y.o. female resting in bed in NAD Eyes: PERRL, normal sclera ENMT: Nares patent w/o discharge, orophaynx clear, dentition normal, ears w/o discharge/lesions/ulcers Neck: Supple, trachea midline Cardiovascular: RRR, +S1, S2, no m/g/r, equal pulses throughout Respiratory: CTABL, no w/r/r, normal WOB GI: BS+, NDNT, no masses noted, no organomegaly noted MSK: No e/c/c Skin: No rashes, bruises, ulcerations noted Neuro: A&O x 3, no focal deficits Psyc: Appropriate interaction and affect, calm/cooperative  Labs on Admission: I have personally reviewed following labs and imaging studies  CBC: Recent Labs  Lab 07/15/21 0908  WBC 6.4  NEUTROABS 4.1  HGB 13.5  HCT 39.5  MCV 95.4  PLT [id number]   Basic Metabolic Panel: Recent Labs  Lab 07/15/21 0908 07/15/21 1334  NA 117* 117*  K 3.5 3.4*  CL 79* 80*  CO2 28 26  GLUCOSE 92 90  BUN 12 11  CREATININE 0.78 0.52  CALCIUM 9.8 10.0   GFR: Estimated Creatinine Clearance: 66 mL/min (by C-G formula based on SCr of 0.52 mg/dL). Liver Function Tests: Recent Labs  Lab 07/15/21 0908  AST 35  ALT 27  ALKPHOS 80  BILITOT 1.5*  PROT 7.4  ALBUMIN 4.5   No results for input(s): LIPASE, AMYLASE in the last 168 hours. No results for input(s): AMMONIA in the last 168 hours. Coagulation Profile: No results for input(s): INR, PROTIME in the last 168 hours. Cardiac Enzymes: No  results for input(s): CKTOTAL, CKMB, CKMBINDEX, TROPONINI in the last 168 hours. BNP (last 3 results) No results for input(s): PROBNP in the last 8760 hours. HbA1C: Recent Labs    07/15/21 0908  HGBA1C 5.1   CBG: No results for input(s): GLUCAP in the last 168 hours. Lipid Profile: Recent Labs    07/15/21 0908  CHOL 217*  HDL 152.10  LDLCALC 48  TRIG 82.0  CHOLHDL 1   Thyroid Function Tests: Recent Labs    07/15/21 0908  TSH 2.46   Anemia Panel: Recent Labs    07/15/21 0908  VITAMINB12 503   Urine analysis:    Component Value  Date/Time   COLORURINE STRAW (A) 07/15/2021 1334   APPEARANCEUR CLEAR 07/15/2021 1334   LABSPEC 1.002 (L) 07/15/2021 1334   PHURINE 7.0 07/15/2021 1334   GLUCOSEU NEGATIVE 07/15/2021 1334   HGBUR SMALL (A) 07/15/2021 1334   BILIRUBINUR NEGATIVE 07/15/2021 1334   [place] 07/15/2021 1334   PROTEINUR NEGATIVE 07/15/2021 1334   NITRITE NEGATIVE 07/15/2021 1334   LEUKOCYTESUR MODERATE (A) 07/15/2021 1334    Radiological Exams on Admission: No results found.  EKG: None obtained in ED.  Assessment/Plan Severe hyponatremia     - admit to inpt, progressive     - aSx right now; she's euvolemic     - not sure of the chronicity of her hypoNa+; she was 131 in May 2021     - urine studies ordered, TSH ordered; follow     - her psyc meds don't seem to be the culprit here; she is taking lisinopril-HCTZ; lisinopril has a low incident of hyponatremia, HCTZ is known to cause hyponatremia; we will hold both     - right now, let's give fluids; watch q6h BMP, limit Na+ recovery to 8 points in the next 24hrs  Hypokalemia     - replace K+, check Mg2+  Depression Anxiety     - resume home regimen  HTN     - holding lisinopril-HCTZ     - she takes cardizem PRN for a fib; we can schedule this for HTN; start her on '90mg'$  BID cardizem SR  A fib     - continue cardizem, eliquis  EtOH abuse     - counseled against further use     -  CIWA  DVT prophylaxis: eliquis  Code Status: DNR  Family Communication: None at bedside  Consults called: None   Status is: Inpatient  Remains inpatient appropriate because:Inpatient level of care appropriate due to severity of illness  Dispo: The patient is from: Home              Anticipated d/c is to: Home              Patient currently is not medically stable to d/c.   Difficult to place patient No  Time spent coordinating admission: 70 minutes  [facility]  If 7PM-7AM, please contact night-coverage [email or web address]  07/15/2021, 3:30 PM

## 2021-07-15 NOTE — Patient Instructions (Signed)
-  Nice seeing you today!!  -Lab work today; will notify you once results are available.  -Schedule follow up in 1 year or sooner as needed. 

## 2021-07-16 DIAGNOSIS — E871 Hypo-osmolality and hyponatremia: Secondary | ICD-10-CM

## 2021-07-16 LAB — BASIC METABOLIC PANEL
Anion gap: 9 (ref 5–15)
Anion gap: 8 (ref 5–15)
BUN: 11 mg/dL (ref 8–23)
BUN: 11 mg/dL (ref 8–23)
CO2: 28 mmol/L (ref 22–32)
CO2: 25 mmol/L (ref 22–32)
Calcium: 10.1 mg/dL (ref 8.9–10.3)
Calcium: 9.7 mg/dL (ref 8.9–10.3)
Chloride: 92 mmol/L — ABNORMAL LOW (ref 98–111)
Chloride: 96 mmol/L — ABNORMAL LOW (ref 98–111)
Creatinine, Ser: 0.61 mg/dL (ref 0.44–1.00)
Creatinine, Ser: 0.5 mg/dL (ref 0.44–1.00)
GFR, Estimated: >60 mL/min (ref >60)
GFR, Estimated: >60 mL/min (ref >60)
Glucose, Bld: 100 mg/dL — ABNORMAL HIGH (ref 70–99)
Glucose, Bld: 113 mg/dL — ABNORMAL HIGH (ref 70–99)
Potassium: 4 mmol/L (ref 3.5–5.1)
Potassium: 4.6 mmol/L (ref 3.5–5.1)
Sodium: 129 mmol/L — ABNORMAL LOW (ref 135–145)
Sodium: 129 mmol/L — ABNORMAL LOW (ref 135–145)

## 2021-07-16 LAB — CBC
HCT: 37.4 % (ref 36.0–46.0)
Hemoglobin: 13 g/dL (ref 12.0–15.0)
MCH: 32.6 pg (ref 26.0–34.0)
MCHC: 34.8 g/dL (ref 30.0–36.0)
MCV: 93.7 fL (ref 80.0–100.0)
Platelets: 235 10*3/uL (ref 150–400)
RBC: 3.99 MIL/uL (ref 3.87–5.11)
RDW: 12.5 % (ref 11.5–15.5)
WBC: 6.1 10*3/uL (ref 4.0–10.5)
nRBC: 0 % (ref 0.0–0.2)

## 2021-07-16 LAB — OSMOLALITY: Osmolality: 253 mOsm/kg — ABNORMAL LOW (ref 275–295)

## 2021-07-16 LAB — HIV ANTIBODY (ROUTINE TESTING W REFLEX): HIV Screen 4th Generation wRfx: NONREACTIVE

## 2021-07-16 LAB — OSMOLALITY, URINE: Osmolality, Ur: 121 mOsm/kg — ABNORMAL LOW (ref 300–900)

## 2021-07-16 NOTE — Care Management CC44 (Signed)
Condition Code 44 Documentation Completed  Patient Details  Name: Margaret Mckenzie MRN: [id number] Date of Birth: [birth year]   Condition Code 44 given:  Yes Patient signature on Condition Code 44 notice:  Yes Documentation of 2 MD's agreement:  Yes Code 44 added to claim:  Yes    Saraann Enneking, RN 07/16/2021, 11:31 AM

## 2021-07-16 NOTE — Discharge Summary (Signed)
Physician Discharge Summary  Shailyn Hayenga [id number] DOB: 12-13-1950 DOA: 07/15/2021  PCP: Isaac Bliss, Rayford Halsted, MD  Admit date: 07/15/2021 Discharge date: 07/16/2021  Time spent: 37 minutes  Recommendations for Outpatient Follow-up:  Zestoretic on hold at this time and can be resumed in the outpatient setting cautiously Patient given heart healthy diet instructions for after labs-for now can liberalize salt intake Follow-up with cardiology outpatient   Discharge Diagnoses:  MAIN problem for hospitalization   Hypovolemic hyponatremia  Please see below for itemized issues addressed in [place]- refer to other progress notes for clarity if needed  Discharge Condition: Improved  Diet recommendation:   Regular  Filed Weights   07/15/21 1250  Weight: 75.5 kg    History of present illness:  70 year old female A. fib CHADS2 score >4 on Eliquis HTN bipolar Seen by PCP and found to have sodium 117 with mild dizziness over the past several days TSH was normal Patient was volume repleted with IV saline and her sodium rebounded to the 129 range Her symptoms resolved completely on discharge she was ambulatory  I had a discussion with her about holding her Zestoretic and liberalizing her salt intake for now temporarily-I do not feel she needs to stay in the hospital she is walking the hallway looks quite comfortable and is pretty independent at home  She will however need labs in 2 to 3 days and consideration for other blood pressure agents such as either lisinopril alone or amlodipine I have CCed Dr. Jerilee Hoh on this note   Discharge Exam: Vitals:   07/16/21 0930 07/16/21 0935  BP: 123/77 123/77  Pulse:  90  Resp:    Temp:    SpO2:      Subj on day of d/c   Very pleasant coherent no distress EOMI NCAT wants to walk around feels very comfortable eating drinking  General Exam on discharge  EOMI NCAT no focal deficit CTA B no rales rhonchi Abdomen  soft no rebound no guarding Mild lower extremity swelling which is chronic for her she states Neurologically intact no focal deficit  Discharge Instructions   Discharge Instructions     Diet - low sodium heart healthy   Complete by: As directed    Discharge instructions   Complete by: As directed    Take medications as directed but do not take any thiazide diuretic/Zestoretic at this time-I have not changed [place] management significantly I would advocate for use of increased amount of salt until your next lab work and then go back to your heart healthy diet I would also mention that you need to keep your appointment with Dr. Percival Spanish for further A. fib care but that this looks very stable in the hospital Best of luck and have a nice fall   Increase activity slowly   Complete by: As directed       Allergies as of 07/16/2021       Reactions   Trazodone And Nefazodone Other (See Comments)   Trazodone causes lethargy the morning after taking it   Soap Rash        Medication List     STOP taking these medications    acetaminophen 500 MG tablet Commonly known as: TYLENOL   BD Eclipse Syringe 25G X 5/8" 3 ML Misc Generic drug: SYRINGE-NEEDLE (DISP) 3 ML   dexamethasone 0.5 MG/5ML elixir   fluocinonide gel 0.05 % Commonly known as: LIDEX   lisinopril-hydrochlorothiazide 20-25 MG tablet Commonly known as: ZESTORETIC   triamcinolone cream  0.1 % Commonly known as: KENALOG       TAKE these medications    ALPRAZolam 1 MG tablet Commonly known as: Xanax Take 1 tablet (1 mg total) by mouth 2 (two) times daily as needed for anxiety. What changed:  when to take this additional instructions   apixaban 5 MG Tabs tablet Commonly known as: Eliquis Take 1 tablet (5 mg total) by mouth 2 (two) times daily.   busPIRone 15 MG tablet Commonly known as: BUSPAR TAKE 1 TABLET BY MOUTH EVERY DAY What changed:  how much to take how to take this when to take  this additional instructions   CALCIUM 600 PO Take 1,200 mg by mouth at bedtime.   cyanocobalamin 1000 MCG/ML injection Commonly known as: (VITAMIN B-12) INJECT 1 ML (1,000 MCG TOTAL) INTO THE MUSCLE EVERY 30 DAYS. What changed: See the new instructions.   diltiazem 30 MG tablet Commonly known as: Cardizem Take 1 tablet every 4 hours AS NEEDED for AFIB heart rate >100 What changed:  how much to take how to take this when to take this reasons to take this additional instructions   lamoTRIgine 150 MG tablet Commonly known as: LAMICTAL TAKE 2 TABLETS BY MOUTH EVERY DAY AT BEDTIME What changed:  how much to take how to take this when to take this additional instructions   Magnesium 500 MG Tabs Take 500 mg by mouth at bedtime.   risperiDONE 3 MG tablet Commonly known as: RISPERDAL Take 1 tablet (3 mg total) by mouth at bedtime.   traZODone 50 MG tablet Commonly known as: DESYREL TAKE 1 TO 2 TABLETS BY MOUTH AT BEDTIME AS NEEDED FOR SLEEP What changed:  how much to take reasons to take this additional instructions   vitamin C 1000 MG tablet Take 1,000 mg by mouth at bedtime.   Vitamin D-3 25 MCG (1000 UT) Caps Take 1,000 Units by mouth at bedtime.       Allergies  Allergen Reactions   Trazodone And Nefazodone Other (See Comments)    Trazodone causes lethargy the morning after taking it   Soap Rash      The results of significant diagnostics from this hospitalization (including imaging, microbiology, ancillary and laboratory) are listed below for reference.    Significant Diagnostic Studies: MM 3D SCREEN BREAST BILATERAL  Result Date: 07/04/2021 CLINICAL DATA:  Screening. EXAM: DIGITAL SCREENING BILATERAL MAMMOGRAM WITH TOMOSYNTHESIS AND CAD TECHNIQUE: Bilateral screening digital craniocaudal and mediolateral oblique mammograms were obtained. Bilateral screening digital breast tomosynthesis was performed. The images were evaluated with computer-aided  detection. COMPARISON:  Previous exam(s). ACR Breast Density Category b: There are scattered areas of fibroglandular density. FINDINGS: There are no findings suspicious for malignancy. IMPRESSION: No mammographic evidence of malignancy. A result letter of this screening mammogram will be mailed directly to the patient. RECOMMENDATION: Screening mammogram in one year. (Code:SM-B-01Y) BI-RADS CATEGORY  1: Negative. Electronically Signed   By: Franki Cabot M.D.   On: 07/04/2021 14:29    Microbiology: Recent Results (from the past 240 hour(s))  Resp Panel by RT-PCR (Flu A&B, Covid) Nasopharyngeal Swab     Status: None   Collection Time: 07/15/21  3:34 PM   Specimen: Nasopharyngeal Swab; Nasopharyngeal(NP) swabs in vial transport medium  Result Value Ref Range Status   SARS Coronavirus 2 by RT PCR NEGATIVE NEGATIVE Final    Comment: (NOTE) SARS-CoV-2 target nucleic acids are NOT DETECTED.  The SARS-CoV-2 RNA is generally detectable in upper respiratory specimens during the acute phase  of infection. The lowest concentration of SARS-CoV-2 viral copies this assay can detect is 138 copies/mL. A negative result does not preclude SARS-Cov-2 infection and should not be used as the sole basis for treatment or other patient management decisions. A negative result may occur with  improper specimen collection/handling, submission of specimen other than nasopharyngeal swab, presence of viral mutation(s) within the areas targeted by this assay, and inadequate number of viral copies(<138 copies/mL). A negative result must be combined with clinical observations, patient history, and epidemiological information. The expected result is Negative.  Fact Sheet for Patients:  EntrepreneurPulse.com.au  Fact Sheet for Healthcare Providers:  IncredibleEmployment.be  This test is no t yet approved or cleared by the [place] FDA and  has been authorized for detection and/or  diagnosis of SARS-CoV-2 by FDA under an Emergency Use Authorization (EUA). This EUA will remain  in effect (meaning this test can be used) for the duration of the COVID-19 declaration under Section 564(b)(1) of the Act, 21 U.S.C.section 360bbb-3(b)(1), unless the authorization is terminated  or revoked sooner.       Influenza A by PCR NEGATIVE NEGATIVE Final   Influenza B by PCR NEGATIVE NEGATIVE Final    Comment: (NOTE) The Xpert Xpress SARS-CoV-2/FLU/RSV plus assay is intended as an aid in the diagnosis of influenza from Nasopharyngeal swab specimens and should not be used as a sole basis for treatment. Nasal washings and aspirates are unacceptable for Xpert Xpress SARS-CoV-2/FLU/RSV testing.  Fact Sheet for Patients: EntrepreneurPulse.com.au  Fact Sheet for Healthcare Providers: IncredibleEmployment.be  This test is not yet approved or cleared by the [place] FDA and has been authorized for detection and/or diagnosis of SARS-CoV-2 by FDA under an Emergency Use Authorization (EUA). This EUA will remain in effect (meaning this test can be used) for the duration of the COVID-19 declaration under Section 564(b)(1) of the Act, 21 U.S.C. section 360bbb-3(b)(1), unless the authorization is terminated or revoked.  Performed at [facility], [place] [address] [place], [place] [address]      Labs: Basic Metabolic Panel: Recent Labs  Lab 07/15/21 0908 07/15/21 1334 07/15/21 1522 07/15/21 1937 07/16/21 0137  NA 117* 117*  --  120* 129*  K 3.5 3.4*  --  3.7 4.0  CL 79* 80*  --  84* 92*  CO2 28 26  --  23 28  GLUCOSE 92 90  --  95 100*  BUN 12 11  --  10 11  CREATININE 0.78 0.52  --  0.53 0.61  CALCIUM 9.8 10.0  --  9.8 10.1  MG  --   --  2.0  --   --   PHOS  --   --  3.9  --   --    Liver Function Tests: Recent Labs  Lab 07/15/21 0908  AST 35  ALT 27  ALKPHOS 80  BILITOT 1.5*  PROT 7.4  ALBUMIN 4.5   No  results for input(s): LIPASE, AMYLASE in the last 168 hours. No results for input(s): AMMONIA in the last 168 hours. CBC: Recent Labs  Lab 07/15/21 0908 07/16/21 0137  WBC 6.4 6.1  NEUTROABS 4.1  --   HGB 13.5 13.0  HCT 39.5 37.4  MCV 95.4 93.7  PLT 250.0 235   Cardiac Enzymes: No results for input(s): CKTOTAL, CKMB, CKMBINDEX, TROPONINI in the last 168 hours. BNP: BNP (last 3 results) No results for input(s): BNP in the last 8760 hours.  ProBNP (last 3 results) No results for input(s):  PROBNP in the last 8760 hours.  CBG: No results for input(s): GLUCAP in the last 168 hours.     Signed:  Nita Sells MD   [facility] 07/16/2021, 10:39 AM

## 2021-07-16 NOTE — Care Management Obs Status (Signed)
[place] NOTIFICATION   Patient Details  Name: Margaret Mckenzie MRN: [id number] Date of Birth: May 26, 1951   Medicare Observation Status Notification Given:  Yes    McGibboneyOletta Darter, RN 07/16/2021, 11:30 AM

## 2021-07-18 ENCOUNTER — Encounter: Payer: Self-pay | Admitting: Internal Medicine

## 2021-07-18 LAB — CYTOLOGY - PAP
Comment: NEGATIVE
Diagnosis: NEGATIVE
High risk HPV: NEGATIVE

## 2021-07-19 ENCOUNTER — Telehealth: Payer: Self-pay | Admitting: Physician Assistant

## 2021-07-19 NOTE — Telephone Encounter (Signed)
Pt is having considerable trouble trying to get sleep. She falls asleep well but is waking in the night and cannot go back to sleep. Avg about 2-3 hrs. She did take an opening for 9/7 but would like sooner.

## 2021-07-19 NOTE — Telephone Encounter (Signed)
Please review

## 2021-07-21 NOTE — Telephone Encounter (Signed)
Pt informed

## 2021-07-21 NOTE — Telephone Encounter (Signed)
Have her increase the trazodone up to a total of 200 mg p.o. nightly as needed sleep.  She should have 50 mg pills and I think is only taking 25 to 50 mg.  Please put her on the cancellation list.

## 2021-07-22 ENCOUNTER — Ambulatory Visit (INDEPENDENT_AMBULATORY_CARE_PROVIDER_SITE_OTHER)
Admission: RE | Admit: 2021-07-22 | Discharge: 2021-07-22 | Disposition: A | Discharge status: Home / Self Care | Payer: PPO | Source: Ambulatory Visit | Attending: Internal Medicine | Admitting: Internal Medicine

## 2021-07-22 ENCOUNTER — Other Ambulatory Visit: Payer: Self-pay

## 2021-07-22 DIAGNOSIS — Z780 Asymptomatic menopausal state: Secondary | ICD-10-CM | POA: Diagnosis not present

## 2021-07-22 DIAGNOSIS — Z13820 Encounter for screening for osteoporosis: Secondary | ICD-10-CM

## 2021-07-28 ENCOUNTER — Other Ambulatory Visit: Payer: Self-pay

## 2021-07-29 ENCOUNTER — Encounter: Payer: Self-pay | Admitting: Internal Medicine

## 2021-07-29 ENCOUNTER — Ambulatory Visit (INDEPENDENT_AMBULATORY_CARE_PROVIDER_SITE_OTHER): Payer: PPO | Admitting: Internal Medicine

## 2021-07-29 VITALS — BP 150/100 | HR 102 | Temp 97.8°F | Wt 168.0 lb

## 2021-07-29 DIAGNOSIS — I10 Essential (primary) hypertension: Secondary | ICD-10-CM | POA: Diagnosis not present

## 2021-07-29 DIAGNOSIS — Z23 Encounter for immunization: Secondary | ICD-10-CM

## 2021-07-29 DIAGNOSIS — E871 Hypo-osmolality and hyponatremia: Secondary | ICD-10-CM | POA: Diagnosis not present

## 2021-07-29 DIAGNOSIS — Z09 Encounter for follow-up examination after completed treatment for conditions other than malignant neoplasm: Secondary | ICD-10-CM | POA: Diagnosis not present

## 2021-07-29 LAB — BASIC METABOLIC PANEL
BUN: 11 mg/dL (ref 6–23)
CO2: 29 mEq/L (ref 19–32)
Calcium: 10.2 mg/dL (ref 8.4–10.5)
Chloride: 101 mEq/L (ref 96–112)
Creatinine, Ser: 0.72 mg/dL (ref 0.40–1.20)
GFR: 85.42 mL/min (ref >60.00)
Glucose, Bld: 94 mg/dL (ref 70–99)
Potassium: 3.9 mEq/L (ref 3.5–5.1)
Sodium: 137 mEq/L (ref 135–145)

## 2021-07-29 MED ORDER — LISINOPRIL 10 MG PO TABS: 10.0000 mg | ORAL_TABLET | Freq: Every day | ORAL | 3 refills | Status: DC

## 2021-07-29 NOTE — Progress Notes (Signed)
Established Patient Office Visit     This visit occurred during the SARS-CoV-2 public health emergency.  Safety protocols were in place, including screening questions prior to the visit, additional usage of staff PPE, and extensive cleaning of exam room while observing appropriate contact time as indicated for disinfecting solutions.    CC/Reason for Visit: Hospital discharge follow-up  HPI: Margaret Mckenzie is a 70 y.o. female who is coming in today for the above mentioned reasons. Past Medical History is significant for: Hypertension, paroxysmal atrial fibrillation and bipolar disorder.  During routine labs for her physical on August 19 she was found to have severe hyponatremia with a sodium of 117.  She was asked to present to the emergency department where she was admitted for observation.  She was taken off her Zestoretic and after fluids her sodium increased to 129 when she was discharged on August 20.  She has noticed elevated blood pressures ever since.  In office today her blood pressure is 150/100.  She feels well and has no acute concerns.  She has been decreasing fluid intake since discharge.   Past Medical/Surgical History: Past Medical History:  Diagnosis Date   Anxiety    Bipolar 1 disorder ([place])    Chicken pox    Depression    Hypertension    high blood pressure readings    Kidney disease    kidney stones and saw urologist, Dr. Edward Qualia for lithotripsy remotely    PAF (paroxysmal atrial fibrillation) [facility])     Past Surgical History:  Procedure Laterality Date   BUNIONECTOMY  2007   [place]    Social History:  reports that she quit smoking about 4 years ago. Her smoking use included cigarettes. She smoked an average of .5 packs per day. She has never used smokeless tobacco. She reports current alcohol use of about 7.0 standard drinks per week. She reports that she does not use drugs.  Allergies: Allergies  Allergen  Reactions   Trazodone And Nefazodone Other (See Comments)    Trazodone causes lethargy the morning after taking it   Soap Rash    Family History:  Family History  Problem Relation Age of Onset   Osteoporosis Mother    Stroke Mother        TIA, stoke   Mental retardation Mother    Hypertension Father    Heart disease Father 63   Stroke Father 19   Mental illness Sister    Breast cancer Neg Hx      Current Outpatient Medications:    ALPRAZolam (XANAX) 1 MG tablet, Take 1 tablet (1 mg total) by mouth 2 (two) times daily as needed for anxiety. (Patient taking differently: Take 1 mg by mouth See admin instructions. Take 1 mg by mouth in the morning and an additional 1 mg once a day as needed for anxiety), Disp: 60 tablet, Rfl: 5   apixaban (ELIQUIS) 5 MG TABS tablet, Take 1 tablet (5 mg total) by mouth 2 (two) times daily., Disp: 180 tablet, Rfl: 3   Ascorbic Acid (VITAMIN C) 1000 MG tablet, Take 1,000 mg by mouth at bedtime., Disp: , Rfl:    busPIRone (BUSPAR) 15 MG tablet, TAKE 1 TABLET BY MOUTH EVERY DAY (Patient taking differently: Take 15 mg by mouth in the morning.), Disp: 90 tablet, Rfl: 3   Calcium Carbonate (CALCIUM 600 PO), Take 1,200 mg by mouth at bedtime., Disp: , Rfl:    Cholecalciferol (VITAMIN D-3) 25  MCG (1000 UT) CAPS, Take 1,000 Units by mouth at bedtime., Disp: , Rfl:    cyanocobalamin (,VITAMIN B-12,) 1000 MCG/ML injection, INJECT 1 ML (1,000 MCG TOTAL) INTO THE MUSCLE EVERY 30 DAYS. (Patient taking differently: Inject 1,000 mcg into the muscle every 30 (thirty) days.), Disp: 3 mL, Rfl: 3   diltiazem (CARDIZEM) 30 MG tablet, Take 1 tablet every 4 hours AS NEEDED for AFIB heart rate >100 (Patient taking differently: Take 30 mg by mouth every 4 (four) hours as needed (for A-Fib heart rate >100).), Disp: 45 tablet, Rfl: 1   lamoTRIgine (LAMICTAL) 150 MG tablet, TAKE 2 TABLETS BY MOUTH EVERY DAY AT BEDTIME (Patient taking differently: Take 300 mg by mouth at bedtime.), Disp:  180 tablet, Rfl: 2   lisinopril (ZESTRIL) 10 MG tablet, Take 1 tablet (10 mg total) by mouth daily., Disp: 90 tablet, Rfl: 3   Magnesium 500 MG TABS, Take 500 mg by mouth at bedtime., Disp: , Rfl:    risperiDONE (RISPERDAL) 3 MG tablet, Take 1 tablet (3 mg total) by mouth at bedtime., Disp: 90 tablet, Rfl: 2   traZODone (DESYREL) 50 MG tablet, TAKE 1 TO 2 TABLETS BY MOUTH AT BEDTIME AS NEEDED FOR SLEEP (Patient taking differently: Take 25 mg by mouth at bedtime as needed for sleep.), Disp: 180 tablet, Rfl: 0  Review of Systems:  Constitutional: Denies fever, chills, diaphoresis, appetite change and fatigue.  HEENT: Denies photophobia, eye pain, redness, hearing loss, ear pain, congestion, sore throat, rhinorrhea, sneezing, mouth sores, trouble swallowing, neck pain, neck stiffness and tinnitus.   Respiratory: Denies SOB, DOE, cough, chest tightness,  and wheezing.   Cardiovascular: Denies chest pain, palpitations and leg swelling.  Gastrointestinal: Denies nausea, vomiting, abdominal pain, diarrhea, constipation, blood in stool and abdominal distention.  Genitourinary: Denies dysuria, urgency, frequency, hematuria, flank pain and difficulty urinating.  Endocrine: Denies: hot or cold intolerance, sweats, changes in hair or nails, polyuria, polydipsia. Musculoskeletal: Denies myalgias, back pain, joint swelling, arthralgias and gait problem.  Skin: Denies pallor, rash and wound.  Neurological: Denies dizziness, seizures, syncope, weakness, light-headedness, numbness and headaches.  Hematological: Denies adenopathy. Easy bruising, personal or family bleeding history  Psychiatric/Behavioral: Denies suicidal ideation, mood changes, confusion, nervousness, sleep disturbance and agitation    Physical Exam: Vitals:   07/29/21 1250  BP: (!) 150/100  Pulse: (!) 102  Temp: 97.8 F (36.6 C)  TempSrc: Oral  SpO2: 98%  Weight: 168 lb (76.2 kg)    Body mass index is 28.84  kg/m.   Constitutional: NAD, calm, comfortable Eyes: PERRL, lids and conjunctivae normal, wears corrective lenses ENMT: Mucous membranes are moist.  Respiratory: clear to auscultation bilaterally, no wheezing, no crackles. Normal respiratory effort. No accessory muscle use.  Cardiovascular: Regular rate and irregular rhythm, no murmurs / rubs / gallops. No extremity edema.  Neurologic: Grossly intact and nonfocal Psychiatric: Normal judgment and insight. Alert and oriented x 3. Normal mood.    Impression and Plan:  Hospital discharge follow-up  Hyponatremia  - Plan: Basic metabolic panel to follow sodium level. -She has been decreasing fluid consumption and has been taken off hydrochlorothiazide.  Essential hypertension  - Plan: lisinopril (ZESTRIL) 10 MG tablet -Blood pressure is elevated today after discontinuation of Zestoretic 2 weeks ago after hospital discharge. -Since the concern was with hydrochlorothiazide, I will resume lisinopril 10 mg daily.  She will return in 6 weeks for follow-up and continue ambulatory blood pressure monitoring.  Need for influenza vaccination  - Plan: Flu Vaccine  QUAD High Dose(Fluad)  Time spent: 32 minutes reviewing chart, interviewing and examining patient and formulating plan of care.   Patient Instructions  -Nice seeing you today!!  -Lab work today; will notify you once results are available.  -Flu vaccine today.  -Start lisinopril 10 mg daily.  -Schedule follow up in 6 weeks.   [name], MD [facility]

## 2021-07-29 NOTE — Patient Instructions (Signed)
-  Nice seeing you today!!  -Lab work today; will notify you once results are available.  -Flu vaccine today.  -Start lisinopril 10 mg daily.  -Schedule follow up in 6 weeks.

## 2021-07-30 ENCOUNTER — Other Ambulatory Visit: Payer: Self-pay | Admitting: Physician Assistant

## 2021-08-03 ENCOUNTER — Encounter: Payer: Self-pay | Admitting: Physician Assistant

## 2021-08-03 ENCOUNTER — Telehealth (INDEPENDENT_AMBULATORY_CARE_PROVIDER_SITE_OTHER): Payer: PPO | Admitting: Physician Assistant

## 2021-08-03 DIAGNOSIS — F411 Generalized anxiety disorder: Secondary | ICD-10-CM

## 2021-08-03 DIAGNOSIS — E871 Hypo-osmolality and hyponatremia: Secondary | ICD-10-CM

## 2021-08-03 DIAGNOSIS — F319 Bipolar disorder, unspecified: Secondary | ICD-10-CM

## 2021-08-03 DIAGNOSIS — G4700 Insomnia, unspecified: Secondary | ICD-10-CM

## 2021-08-03 MED ORDER — ZOLPIDEM TARTRATE 5 MG PO TABS: 2.5000 mg | ORAL_TABLET | Freq: Every evening | ORAL | 1 refills | Status: DC | PRN

## 2021-08-03 NOTE — Progress Notes (Signed)
[facility]  Patient ID: Margaret Mckenzie,  MRN: [id number]  PCP: Isaac Bliss, Rayford Halsted, MD  Date of Evaluation: 08/03/2021 Time spent:40 minutes  Chief Complaint:  Chief Complaint   Anxiety; Depression; Insomnia; Follow-up    Virtual Visit via Telehealth  I connected with patient by a video enabled telemedicine application with their informed consent, and verified patient privacy and that I am speaking with the correct person using two identifiers.  I am private, in my office and the patient is at home.  I discussed the limitations, risks, security and privacy concerns of performing an evaluation and management service by video and the availability of in person appointments. I also discussed with the patient that there may be a patient responsible charge related to this service. The patient expressed understanding and agreed to proceed.   I discussed the assessment and treatment plan with the patient. The patient was provided an opportunity to ask questions and all were answered. The patient agreed with the plan and demonstrated an understanding of the instructions.   The patient was advised to call back or seek an in-person evaluation if the symptoms worsen or if the condition fails to improve as anticipated.  I provided  40  minutes of non-face-to-face time during this encounter.   HISTORY/CURRENT STATUS: HPI  Not sleeping.   States that for several months now she has not been sleeping well.  She usually does not have any problems going to sleep but wakes up after a couple of hours and then may not be able to go back to sleep for 1 to 2 hours.  She had been using the trazodone but it was not keeping her asleep.  One night she cut a pill in half and took that when she woke up in the middle of the night, that caused extreme dizziness.  So she stopped that drug altogether.  She is not drinking caffeine late in the day.  She goes to bed around 7:00 because she gets  up extremely early for work at the [employer].  As far as her mood goes she is doing well. Patient denies loss of interest in usual activities and is able to enjoy things.  Denies decreased energy or motivation.  Appetite has not changed.  No extreme sadness, tearfulness, or feelings of hopelessness.  Denies any changes in concentration, making decisions or remembering things.  Denies suicidal or homicidal thoughts.  Patient denies increased energy with decreased need for sleep, no increased talkativeness, no racing thoughts, no impulsivity or risky behaviors, no increased spending, no increased libido, no grandiosity, no increased irritability or anger, and no hallucinations.  Anxiety is well controlled.  She does use the Xanax daily.  More in the mornings and at work, not in the evenings.  Not having panic attacks but just gets anxious while at work, dealing with the public.  She is drinking 1 or 2 glasses of wine per night but she only feels the glass no more than half full and then puts water for the rest of the glass.  Denies dizziness, syncope, seizures, numbness, tingling, tremor, tics, unsteady gait, slurred speech, confusion. Denies muscle or joint pain, stiffness, or dystonia.  Individual Medical History/ Review of Systems: Changes? :Yes   was in hospital for low Na a few weeks ago.  She had a routine physical at her PCP's office.  She was called later that same day and told to go to the emergency room.  Only stayed overnight.  Hydrochlorothiazide was discontinued.  She was given normal saline and the sodium level increased. New diagnosis of osteopenia.   Past medications for mental health diagnoses include: Depakote, Lamictal, Prozac, Lexapro, Xanax, Sonata caused amnesia, Risperdal, Trazodone ineffective at low doses but caused dizziness at higher doses.  Allergies: Trazodone and nefazodone and Soap  Current Medications:  Current Outpatient Medications:    ALPRAZolam (XANAX) 1 MG  tablet, Take 1 tablet (1 mg total) by mouth 2 (two) times daily as needed for anxiety. (Patient taking differently: Take 1 mg by mouth See admin instructions. Take 1 mg by mouth in the morning and an additional 1 mg once a day as needed for anxiety), Disp: 60 tablet, Rfl: 5   apixaban (ELIQUIS) 5 MG TABS tablet, Take 1 tablet (5 mg total) by mouth 2 (two) times daily., Disp: 180 tablet, Rfl: 3   Ascorbic Acid (VITAMIN C) 1000 MG tablet, Take 1,000 mg by mouth at bedtime., Disp: , Rfl:    busPIRone (BUSPAR) 15 MG tablet, TAKE 1 TABLET BY MOUTH EVERY DAY (Patient taking differently: Take 15 mg by mouth in the morning.), Disp: 90 tablet, Rfl: 3   Calcium Carbonate (CALCIUM 600 PO), Take 1,200 mg by mouth at bedtime., Disp: , Rfl:    Cholecalciferol (VITAMIN D-3) 25 MCG (1000 UT) CAPS, Take 1,000 Units by mouth at bedtime., Disp: , Rfl:    cyanocobalamin (,VITAMIN B-12,) 1000 MCG/ML injection, INJECT 1 ML (1,000 MCG TOTAL) INTO THE MUSCLE EVERY 30 DAYS. (Patient taking differently: Inject 1,000 mcg into the muscle every 30 (thirty) days.), Disp: 3 mL, Rfl: 3   diltiazem (CARDIZEM) 30 MG tablet, Take 1 tablet every 4 hours AS NEEDED for AFIB heart rate >100 (Patient taking differently: Take 30 mg by mouth every 4 (four) hours as needed (for A-Fib heart rate >100).), Disp: 45 tablet, Rfl: 1   lamoTRIgine (LAMICTAL) 150 MG tablet, TAKE 2 TABLETS BY MOUTH EVERY DAY AT BEDTIME (Patient taking differently: Take 300 mg by mouth at bedtime.), Disp: 180 tablet, Rfl: 2   lisinopril (ZESTRIL) 10 MG tablet, Take 1 tablet (10 mg total) by mouth daily., Disp: 90 tablet, Rfl: 3   Magnesium 500 MG TABS, Take 500 mg by mouth at bedtime., Disp: , Rfl:    risperiDONE (RISPERDAL) 3 MG tablet, Take 1 tablet (3 mg total) by mouth at bedtime., Disp: 90 tablet, Rfl: 2   zolpidem (AMBIEN) 5 MG tablet, Take 0.5-1 tablets (2.5-5 mg total) by mouth at bedtime as needed for sleep., Disp: 30 tablet, Rfl: 1   traZODone (DESYREL) 50 MG  tablet, TAKE 1 TO 2 TABLETS BY MOUTH AT BEDTIME AS NEEDED FOR SLEEP, Disp: 180 tablet, Rfl: 0 Medication Side Effects: none  Family Medical/ Social History: Changes?  No  MENTAL HEALTH EXAM:  There were no vitals taken for this visit.There is no height or weight on file to calculate BMI.  General Appearance: Casual, Neat, and Well Groomed  Eye Contact:  Good  Speech:  Clear and Coherent and Normal Rate  Volume:  Normal  Mood:  Euthymic  Affect:  Appropriate  Thought Process:  Goal Directed and Descriptions of Associations: Circumstantial  Orientation:  Full (Time, Place, and Person)  Thought Content: Logical   Suicidal Thoughts:  No  Homicidal Thoughts:  No  Memory:  WNL  Judgement:  Good  Insight:  Good  Psychomotor Activity:  Normal  Concentration:  Concentration: Good  Recall:  Good  Fund of Knowledge: Good  Language: Good  Assets:  Desire for Improvement  ADL's:  Intact  Cognition: WNL  Prognosis:  Good   Labs  07/15/2021 CBC was within normal limits. CMP sodium 117 chloride 79, glucose 92, remainder of CMP was normal. Hemoglobin A1c 5.1 Lipid panel total cholesterol 217, triglycerides 82, HDL 152, LDL 48 TSH 2.46 Vitamin D 55.7 BMP later in the day sodium 120 07/16/2021 Sodium 129 07/29/2021 sodium 137, chloride normal.  DIAGNOSES:    ICD-10-CM   1. Insomnia, unspecified type  G47.00     2. Bipolar I disorder ([place])  F31.9     3. Generalized anxiety disorder  F41.1     4. Hyponatremia  E87.1       Receiving Psychotherapy: No    RECOMMENDATIONS:  PDMP was reviewed.  Last Xanax was filled 07/25/2021. I provided 40 minutes of non-face-to-face time during this encounter, including time spent before and after the visit in records review, medical decision making, counseling pertinent to today's visit, and charting.  We discussed the insomnia.  Sleep hygiene tips were provided.  She needs to cut back on the alcohol consumption even though it is more of 1/2 glass  to 1 glass of wine in the evening.  No caffeine within 8 to 10 hours of the time she needs to go to sleep. We also discussed the hyponatremia.  I am glad that she has responded to increase fluids and getting off the HCTZ.  We did discuss the fact that some psychiatric medications can cause hyponatremia as well, she had not been taking the trazodone routinely so I do not think that would have been an issue.  She is going back in about 6 weeks for her PCP to check the sodium level again.  She will let me know if there are other problems with it.  At this point it does not seem related to the Risperdal, BuSpar, Lamictal or Xanax. Discussed Ambien or Lunesta for sleep.  She has taken Sonata before but it did cause her to sleep eat with amnesia.  I recommend Ambien but very low dose.  Benefits risk and side effects were discussed and she accepts.  She knows not to take this if she is drinking wine within 4 hours. Discontinued trazodone. Start Ambien 5 mg, 1/2-1 p.o. nightly as needed sleep.  She will take it no later than 6:30 PM as she needs to go to sleep at 7 PM. Continue Xanax 1 mg, 1 p.o. twice daily as needed. Continue BuSpar 15 mg, 1 p.o. daily. Continue Lamictal 150 mg, 2 p.o. nightly. Continue Risperdal 3 mg, 1 p.o. nightly. Return in 2 months.  Donnal Moat, PA-C

## 2021-09-12 ENCOUNTER — Other Ambulatory Visit: Payer: Self-pay

## 2021-09-13 ENCOUNTER — Encounter: Payer: Self-pay | Admitting: Internal Medicine

## 2021-09-13 ENCOUNTER — Ambulatory Visit (INDEPENDENT_AMBULATORY_CARE_PROVIDER_SITE_OTHER): Payer: PPO | Admitting: Internal Medicine

## 2021-09-13 VITALS — BP 140/90 | HR 80 | Temp 98.1°F | Wt 169.4 lb

## 2021-09-13 DIAGNOSIS — I10 Essential (primary) hypertension: Secondary | ICD-10-CM

## 2021-09-13 DIAGNOSIS — I480 Paroxysmal atrial fibrillation: Secondary | ICD-10-CM

## 2021-09-13 MED ORDER — LISINOPRIL 20 MG PO TABS: 20.0000 mg | ORAL_TABLET | Freq: Every day | ORAL | 1 refills | Status: DC

## 2021-09-13 MED ORDER — APIXABAN 5 MG PO TABS: 5.0000 mg | ORAL_TABLET | Freq: Two times a day (BID) | ORAL | 0 refills | Status: DC

## 2021-09-13 NOTE — Progress Notes (Signed)
Established Patient Office Visit     This visit occurred during the SARS-CoV-2 public health emergency.  Safety protocols were in place, including screening questions prior to the visit, additional usage of staff PPE, and extensive cleaning of exam room while observing appropriate contact time as indicated for disinfecting solutions.    CC/Reason for Visit: Blood pressure follow-up  HPI: Darlyn Repsher is a 70 y.o. female who is coming in today for the above mentioned reasons. Past Medical History is significant for: Hypertension and atrial fibrillation, bipolar disorder.  She was taken off hydrochlorothiazide due to profound hyponatremia that required hospitalization.  At last visit in early September she was resumed on lisinopril 10 mg daily.  And he is here today for follow-up.  Unfortunately blood pressure remains elevated to 140/90.  She feels well and has no acute concerns or complaints.   Past Medical/Surgical History: Past Medical History:  Diagnosis Date   Anxiety    Bipolar 1 disorder ([place])    Chicken pox    Depression    Hypertension    high blood pressure readings    Kidney disease    kidney stones and saw urologist, Dr. Edward Qualia for lithotripsy remotely    PAF (paroxysmal atrial fibrillation) [facility])     Past Surgical History:  Procedure Laterality Date   BUNIONECTOMY  2007   [place]    Social History:  reports that she quit smoking about 4 years ago. Her smoking use included cigarettes. She smoked an average of .5 packs per day. She has never used smokeless tobacco. She reports current alcohol use of about 7.0 standard drinks per week. She reports that she does not use drugs.  Allergies: Allergies  Allergen Reactions   Trazodone And Nefazodone Other (See Comments)    Trazodone causes lethargy the morning after taking it   Soap Rash    Family History:  Family History  Problem Relation Age of Onset   Osteoporosis  Mother    Stroke Mother        TIA, stoke   Mental retardation Mother    Hypertension Father    Heart disease Father 31   Stroke Father 66   Mental illness Sister    Breast cancer Neg Hx      Current Outpatient Medications:    ALPRAZolam (XANAX) 1 MG tablet, Take 1 tablet (1 mg total) by mouth 2 (two) times daily as needed for anxiety. (Patient taking differently: Take 1 mg by mouth See admin instructions. Take 1 mg by mouth in the morning and an additional 1 mg once a day as needed for anxiety), Disp: 60 tablet, Rfl: 5   apixaban (ELIQUIS) 5 MG TABS tablet, Take 1 tablet (5 mg total) by mouth 2 (two) times daily., Disp: 180 tablet, Rfl: 3   Ascorbic Acid (VITAMIN C) 1000 MG tablet, Take 1,000 mg by mouth at bedtime., Disp: , Rfl:    busPIRone (BUSPAR) 15 MG tablet, TAKE 1 TABLET BY MOUTH EVERY DAY (Patient taking differently: Take 15 mg by mouth in the morning.), Disp: 90 tablet, Rfl: 3   Calcium Carbonate (CALCIUM 600 PO), Take 1,200 mg by mouth at bedtime., Disp: , Rfl:    Cholecalciferol (VITAMIN D-3) 25 MCG (1000 UT) CAPS, Take 1,000 Units by mouth at bedtime., Disp: , Rfl:    cyanocobalamin (,VITAMIN B-12,) 1000 MCG/ML injection, INJECT 1 ML (1,000 MCG TOTAL) INTO THE MUSCLE EVERY 30 DAYS. (Patient taking differently: Inject 1,000 mcg into  the muscle every 30 (thirty) days.), Disp: 3 mL, Rfl: 3   diltiazem (CARDIZEM) 30 MG tablet, Take 1 tablet every 4 hours AS NEEDED for AFIB heart rate >100 (Patient taking differently: Take 30 mg by mouth every 4 (four) hours as needed (for A-Fib heart rate >100).), Disp: 45 tablet, Rfl: 1   lamoTRIgine (LAMICTAL) 150 MG tablet, TAKE 2 TABLETS BY MOUTH EVERY DAY AT BEDTIME (Patient taking differently: Take 300 mg by mouth at bedtime.), Disp: 180 tablet, Rfl: 2   Magnesium 500 MG TABS, Take 500 mg by mouth at bedtime., Disp: , Rfl:    risperiDONE (RISPERDAL) 3 MG tablet, Take 1 tablet (3 mg total) by mouth at bedtime., Disp: 90 tablet, Rfl: 2    traZODone (DESYREL) 50 MG tablet, TAKE 1 TO 2 TABLETS BY MOUTH AT BEDTIME AS NEEDED FOR SLEEP, Disp: 180 tablet, Rfl: 0   zolpidem (AMBIEN) 5 MG tablet, Take 0.5-1 tablets (2.5-5 mg total) by mouth at bedtime as needed for sleep., Disp: 30 tablet, Rfl: 1   lisinopril (ZESTRIL) 20 MG tablet, Take 1 tablet (20 mg total) by mouth daily., Disp: 90 tablet, Rfl: 1  Review of Systems:  Constitutional: Denies fever, chills, diaphoresis, appetite change and fatigue.  HEENT: Denies photophobia, eye pain, redness, hearing loss, ear pain, congestion, sore throat, rhinorrhea, sneezing, mouth sores, trouble swallowing, neck pain, neck stiffness and tinnitus.   Respiratory: Denies SOB, DOE, cough, chest tightness,  and wheezing.   Cardiovascular: Denies chest pain, palpitations and leg swelling.  Gastrointestinal: Denies nausea, vomiting, abdominal pain, diarrhea, constipation, blood in stool and abdominal distention.  Genitourinary: Denies dysuria, urgency, frequency, hematuria, flank pain and difficulty urinating.  Endocrine: Denies: hot or cold intolerance, sweats, changes in hair or nails, polyuria, polydipsia. Musculoskeletal: Denies myalgias, back pain, joint swelling, arthralgias and gait problem.  Skin: Denies pallor, rash and wound.  Neurological: Denies dizziness, seizures, syncope, weakness, light-headedness, numbness and headaches.  Hematological: Denies adenopathy. Easy bruising, personal or family bleeding history  Psychiatric/Behavioral: Denies suicidal ideation, mood changes, confusion, nervousness, sleep disturbance and agitation    Physical Exam: Vitals:   09/13/21 1317  BP: 140/90  Pulse: 80  Temp: 98.1 F (36.7 C)  TempSrc: Oral  SpO2: 99%  Weight: 169 lb 6.4 oz (76.8 kg)    Body mass index is 29.08 kg/m.   Constitutional: NAD, calm, comfortable Eyes: PERRL, lids and conjunctivae normal, wears corrective lenses ENMT: Mucous membranes are moist.  Respiratory: clear to  auscultation bilaterally, no wheezing, no crackles. Normal respiratory effort. No accessory muscle use.  Cardiovascular: Regular rate and rhythm, no murmurs / rubs / gallops. No extremity edema.  Neurologic: Grossly intact and nonfocal Psychiatric: Normal judgment and insight. Alert and oriented x 3. Normal mood.    Impression and Plan:  Essential hypertension  - Plan: lisinopril (ZESTRIL) 20 MG tablet -Increase lisinopril from 10 to 20 mg daily, she is advised to do ambulatory blood pressure monitoring and return in 6 weeks for follow-up.    Patient Instructions  -Nice seeing you today!!  -Increase lisinopril to 20 mg daily.  -Check BP at home and bring measurements to your next visit.  -Schedule follow up in 6 weeks.    [name], MD [facility]

## 2021-09-13 NOTE — Patient Instructions (Signed)
-  Nice seeing you today!!  -Increase lisinopril to 20 mg daily.  -Check BP at home and bring measurements to your next visit.  -Schedule follow up in 6 weeks.

## 2021-09-15 ENCOUNTER — Telehealth: Payer: Self-pay | Admitting: Internal Medicine

## 2021-09-15 NOTE — Chronic Care Management (AMB) (Signed)
  Chronic Care Management   Outreach Note  09/15/2021 Name: Margaret Mckenzie MRN: [id number] DOB: 07-14-1951  Referred by: Isaac Bliss, Rayford Halsted, MD Reason for referral : No chief complaint on file.   An unsuccessful telephone outreach was attempted today. The patient was referred to the pharmacist for assistance with care management and care coordination.   Follow Up Plan:   [name] Dellinger Upstream Scheduler

## 2021-09-15 NOTE — Progress Notes (Signed)
  Chronic Care Management   Note  09/15/2021 Name: Margaret Mckenzie MRN: [id number] DOB: 1951/11/06  Margaret Mckenzie is a 70 y.o. year old female who is a primary care patient of Isaac Bliss, Rayford Halsted, MD. I reached out to Ross Marcus by phone today in response to a referral sent by [name]'s PCP, Isaac Bliss, Rayford Halsted, MD.   [name] was given information about Chronic Care Management services today including:  CCM service includes personalized support from designated clinical staff supervised by her physician, including individualized plan of care and coordination with other care providers 24/7 contact phone numbers for assistance for urgent and routine care needs. Service will only be billed when office clinical staff spend 20 minutes or more in a month to coordinate care. Only one practitioner may furnish and bill the service in a calendar month. The patient may stop CCM services at any time (effective at the end of the month) by phone call to the office staff.   Patient agreed to services and verbal consent obtained.   Follow up plan:   [name] Secretary/administrator

## 2021-10-02 ENCOUNTER — Other Ambulatory Visit: Payer: Self-pay | Admitting: Cardiology

## 2021-10-02 DIAGNOSIS — I480 Paroxysmal atrial fibrillation: Secondary | ICD-10-CM

## 2021-10-03 ENCOUNTER — Encounter: Payer: Self-pay | Admitting: Physician Assistant

## 2021-10-03 ENCOUNTER — Other Ambulatory Visit: Payer: Self-pay

## 2021-10-03 ENCOUNTER — Ambulatory Visit (INDEPENDENT_AMBULATORY_CARE_PROVIDER_SITE_OTHER): Payer: PPO | Admitting: Physician Assistant

## 2021-10-03 DIAGNOSIS — F319 Bipolar disorder, unspecified: Secondary | ICD-10-CM

## 2021-10-03 DIAGNOSIS — F411 Generalized anxiety disorder: Secondary | ICD-10-CM | POA: Diagnosis not present

## 2021-10-03 DIAGNOSIS — G4700 Insomnia, unspecified: Secondary | ICD-10-CM

## 2021-10-03 MED ORDER — ALPRAZOLAM 1 MG PO TABS: 1.0000 mg | ORAL_TABLET | Freq: Two times a day (BID) | ORAL | 5 refills | Status: DC | PRN

## 2021-10-03 NOTE — Progress Notes (Signed)
[facility]  Patient ID: Margaret Mckenzie,  MRN: [id number]  PCP: Isaac Bliss, Rayford Halsted, MD  Date of Evaluation: 10/03/2021 Time spent:30 minutes  Chief Complaint:  Chief Complaint   Anxiety; Depression; Insomnia; Follow-up       HISTORY/CURRENT STATUS: HPI  For routine med check.  Margaret Mckenzie is doing really well.  At the last visit 2 months ago we had added Ambien.  States she did not even take it.  Started taking trazodone 25 mg nightly as needed and then another 25 mg in the middle of the night if she needed it.  That has been working very well for her and does not cause morning drowsiness.  So she is feeling good and rested when she gets up.  She still works early in the morning at the [employer] and does not feel too tired to go to work.  Prior to the last visit she had been hospitalized for hyponatremia.  She has since had normal sodium levels.  She feels fine.  Patient denies loss of interest in usual activities and is able to enjoy things.  Denies decreased energy or motivation.  Appetite has not changed.  No extreme sadness, tearfulness, or feelings of hopelessness.  Denies any changes in concentration, making decisions or remembering things.  She does have anxiety still, not so much with panic attacks but more generalized sense of unease, like something bad is going to happen.  The Xanax helps.  Denies suicidal or homicidal thoughts.  Patient denies increased energy with decreased need for sleep, no increased talkativeness, no racing thoughts, no impulsivity or risky behaviors, no increased spending, no increased libido, no grandiosity, no increased irritability or anger, and no hallucinations.  Denies dizziness, syncope, seizures, numbness, tingling, tremor, tics, unsteady gait, slurred speech, confusion. Denies muscle or joint pain, stiffness, or dystonia.  Individual Medical History/ Review of Systems: Changes? :No    continues to  follow-up with her PCP as they request.  Also has an annual cardiology appointment within the next few weeks to follow-up with A. fib.  She is doing really well and rarely needs the Cardizem.  Past medications for mental health diagnoses include: Depakote, Lamictal, Prozac, Lexapro, Xanax, Sonata caused amnesia, Risperdal, Trazodone ineffective at low doses but caused dizziness at higher doses.  Allergies: Trazodone and nefazodone and Soap  Current Medications:  Current Outpatient Medications:    Ascorbic Acid (VITAMIN C) 1000 MG tablet, Take 1,000 mg by mouth at bedtime., Disp: , Rfl:    busPIRone (BUSPAR) 15 MG tablet, TAKE 1 TABLET BY MOUTH EVERY DAY (Patient taking differently: Take 15 mg by mouth in the morning.), Disp: 90 tablet, Rfl: 3   Calcium Carbonate (CALCIUM 600 PO), Take 1,200 mg by mouth at bedtime., Disp: , Rfl:    Cholecalciferol (VITAMIN D-3) 25 MCG (1000 UT) CAPS, Take 1,000 Units by mouth at bedtime., Disp: , Rfl:    cyanocobalamin (,VITAMIN B-12,) 1000 MCG/ML injection, INJECT 1 ML (1,000 MCG TOTAL) INTO THE MUSCLE EVERY 30 DAYS. (Patient taking differently: Inject 1,000 mcg into the muscle every 30 (thirty) days.), Disp: 3 mL, Rfl: 3   diltiazem (CARDIZEM) 30 MG tablet, Take 1 tablet every 4 hours AS NEEDED for AFIB heart rate >100 (Patient taking differently: Take 30 mg by mouth every 4 (four) hours as needed (for A-Fib heart rate >100).), Disp: 45 tablet, Rfl: 1   ELIQUIS 5 MG TABS tablet, TAKE 1 TABLET BY MOUTH TWICE A DAY, Disp: 180 tablet,  Rfl: 3   lamoTRIgine (LAMICTAL) 150 MG tablet, TAKE 2 TABLETS BY MOUTH EVERY DAY AT BEDTIME (Patient taking differently: Take 300 mg by mouth at bedtime.), Disp: 180 tablet, Rfl: 2   lisinopril (ZESTRIL) 20 MG tablet, Take 1 tablet (20 mg total) by mouth daily., Disp: 90 tablet, Rfl: 1   Magnesium 500 MG TABS, Take 500 mg by mouth at bedtime., Disp: , Rfl:    risperiDONE (RISPERDAL) 3 MG tablet, Take 1 tablet (3 mg total) by mouth at  bedtime., Disp: 90 tablet, Rfl: 2   traZODone (DESYREL) 50 MG tablet, TAKE 1 TO 2 TABLETS BY MOUTH AT BEDTIME AS NEEDED FOR SLEEP, Disp: 180 tablet, Rfl: 0   ALPRAZolam (XANAX) 1 MG tablet, Take 1 tablet (1 mg total) by mouth 2 (two) times daily as needed for anxiety., Disp: 60 tablet, Rfl: 5   zolpidem (AMBIEN) 5 MG tablet, Take 0.5-1 tablets (2.5-5 mg total) by mouth at bedtime as needed for sleep. (Patient not taking: Reported on 10/03/2021), Disp: 30 tablet, Rfl: 1 Medication Side Effects: none  Family Medical/ Social History: Changes?  No  MENTAL HEALTH EXAM:  There were no vitals taken for this visit.There is no height or weight on file to calculate BMI.  General Appearance: Casual, Neat, and Well Groomed  Eye Contact:  Good  Speech:  Clear and Coherent and Normal Rate  Volume:  Normal  Mood:  Euthymic  Affect:  Appropriate  Thought Process:  Goal Directed and Descriptions of Associations: Circumstantial  Orientation:  Full (Time, Place, and Person)  Thought Content: Logical   Suicidal Thoughts:  No  Homicidal Thoughts:  No  Memory:  WNL  Judgement:  Good  Insight:  Good  Psychomotor Activity:  Normal  Concentration:  Concentration: Good  Recall:  Good  Fund of Knowledge: Good  Language: Good  Assets:  Desire for Improvement  ADL's:  Intact  Cognition: WNL  Prognosis:  Good   Labs  07/29/2021 BMP completely normal.   DIAGNOSES:    ICD-10-CM   1. Bipolar I disorder ([place])  F31.9     2. Insomnia, unspecified type  G47.00     3. Generalized anxiety disorder  F41.1        Receiving Psychotherapy: No    RECOMMENDATIONS:  PDMP was reviewed.  Last Xanax was filled 09/22/2021.  Ambien filled 08/03/2021. I provided 30 minutes of face to face time during this encounter, including time spent before and after the visit in records review, medical decision making, and charting.  I am glad to see her doing so well!  No changes in meds are necessary. She has the Ambien to  take if needed but otherwise we will continue the trazodone as she has been. Will start Ambien 5 mg, 1/2-1 p.o. nightly as needed sleep.  She will take it no later than 6:30 PM as she needs to go to sleep at 7 PM. (She did not start it in September 2022 when prescribed as she did not need it.) Continue Xanax 1 mg, 1 p.o. twice daily as needed. Continue BuSpar 15 mg, 1 p.o. daily. Continue Lamictal 150 mg, 2 p.o. nightly. Continue Risperdal 3 mg, 1 p.o. nightly. Continue trazodone 50 mg, 1/2-2 p.o. nightly as needed sleep. Return in 6 months.  Donnal Moat, PA-C

## 2021-10-03 NOTE — Telephone Encounter (Signed)
Prescription refill request for Eliquis received. Indication:Afib Last office visit:upcoming Scr:0.7 Age: 70 Weight:76.8 kg  Prescription refilled

## 2021-10-10 NOTE — Progress Notes (Signed)
Cardiology Office Note   Date:  10/11/2021   ID:  Cale, [place] [birth year], MRN [id number]  PCP:  Isaac Bliss, Rayford Halsted, MD  Cardiologist:   Minus Breeding, MD    Chief Complaint  Patient presents with   Atrial Fibrillation       History of Present Illness: Margaret Mckenzie is a 70 y.o. female who presents for evaluation of atrial fib.  She was noted to be in this in her PCP office in July of 2018.  She as been seen in the Afib Clinic.  She did have an echocardiogram and this demonstrated some LVH and was otherwise unremarkable.  She was in the ED in Dec 2019 for PAF.  This has progressed to chronic atrial fib  Since I last saw her she has done well.  She was in the hospital with low sodium and I reviewed these records.  This was related to the HCTZ component of her lisinopril HCTZ.  This was stopped.  Her follow-up sodium was normal.  She is felt okay.  She does not feel her atrial fibrillation.  The patient denies any new symptoms such as chest discomfort, neck or arm discomfort. There has been no new shortness of breath, PND or orthopnea. There have been no reported palpitations, presyncope or syncope.   She does show me some blood pressures that have been somewhat labile but for the most part the diastolics are elevated.  She does not have any recent heart rates.   Past Medical History:  Diagnosis Date   Anxiety    Bipolar 1 disorder ([place])    Chicken pox    Depression    Hypertension    high blood pressure readings    Kidney disease    kidney stones and saw urologist, Dr. Edward Qualia for lithotripsy remotely    PAF (paroxysmal atrial fibrillation) [facility])     Past Surgical History:  Procedure Laterality Date   BUNIONECTOMY  2007   TONSILLECTOMY AND ADENOIDECTOMY  1975     Current Outpatient Medications  Medication Sig Dispense Refill   ALPRAZolam (XANAX) 1 MG tablet Take 1 tablet (1 mg total) by mouth 2 (two) times daily as needed for anxiety.  60 tablet 5   Ascorbic Acid (VITAMIN C) 1000 MG tablet Take 1,000 mg by mouth at bedtime.     busPIRone (BUSPAR) 15 MG tablet TAKE 1 TABLET BY MOUTH EVERY DAY (Patient taking differently: Take 15 mg by mouth in the morning.) 90 tablet 3   Calcium Carbonate (CALCIUM 600 PO) Take 1,200 mg by mouth at bedtime.     Cholecalciferol (VITAMIN D-3) 25 MCG (1000 UT) CAPS Take 1,000 Units by mouth at bedtime.     cyanocobalamin (,VITAMIN B-12,) 1000 MCG/ML injection INJECT 1 ML (1,000 MCG TOTAL) INTO THE MUSCLE EVERY 30 DAYS. (Patient taking differently: Inject 1,000 mcg into the muscle every 30 (thirty) days.) 3 mL 3   diltiazem (CARDIZEM CD) 120 MG 24 hr capsule Take 1 capsule (120 mg total) by mouth daily. 90 capsule 3   diltiazem (CARDIZEM) 30 MG tablet Take 1 tablet every 4 hours AS NEEDED for AFIB heart rate >100 (Patient taking differently: Take 30 mg by mouth every 4 (four) hours as needed (for A-Fib heart rate >100).) 45 tablet 1   ELIQUIS 5 MG TABS tablet TAKE 1 TABLET BY MOUTH TWICE A DAY 180 tablet 3   lamoTRIgine (LAMICTAL) 150 MG tablet TAKE 2 TABLETS BY MOUTH EVERY DAY AT BEDTIME (  Patient taking differently: Take 300 mg by mouth at bedtime.) 180 tablet 2   lisinopril (ZESTRIL) 20 MG tablet Take 1 tablet (20 mg total) by mouth daily. 90 tablet 1   Magnesium 500 MG TABS Take 500 mg by mouth at bedtime.     risperiDONE (RISPERDAL) 3 MG tablet Take 1 tablet (3 mg total) by mouth at bedtime. 90 tablet 2   traZODone (DESYREL) 50 MG tablet TAKE 1 TO 2 TABLETS BY MOUTH AT BEDTIME AS NEEDED FOR SLEEP 180 tablet 0   zolpidem (AMBIEN) 5 MG tablet Take 0.5-1 tablets (2.5-5 mg total) by mouth at bedtime as needed for sleep. (Patient not taking: No sig reported) 30 tablet 1   No current facility-administered medications for this visit.    Allergies:   Trazodone and nefazodone and Soap    ROS:  Please see the history of present illness.   Otherwise, review of systems are positive for NONE.   All other  systems are reviewed and negative.    PHYSICAL EXAM: VS:  BP (!) 150/100 (BP Location: Left Arm)   Pulse (!) 134   Ht 5\' 4"  (1.626 m)   Wt 170 lb 9.6 oz (77.4 kg)   SpO2 98%   BMI 29.28 kg/m  , BMI Body mass index is 29.28 kg/m.  GENERAL:  Well appearing NECK:  No jugular venous distention, waveform within normal limits, carotid upstroke brisk and symmetric, no bruits, no thyromegaly LUNGS:  Clear to auscultation bilaterally CHEST:  Unremarkable HEART:  PMI not displaced or sustained,S1 and S2 within normal limits, no S3,  no clicks, no rubs, no murmurs, irregular ABD:  Flat, positive bowel sounds normal in frequency in pitch, no bruits, no rebound, no guarding, no midline pulsatile mass, no hepatomegaly, no splenomegaly EXT:  2 plus pulses throughout, no edema, no cyanosis no clubbing  EKG:  EKG is not ordered today. Sinus rhythm, rate 98, axis within normal limits, intervals within normal limits, poor anterior R wave progression, premature ventricular contraction.  No acute ST-T wave changes.  07/15/2021  Recent Labs: 07/15/2021: ALT 27; Magnesium 2.0; TSH 1.998 07/16/2021: Hemoglobin 13.0; Platelets 235 07/29/2021: BUN 11; Creatinine, Ser 0.72; Potassium 3.9; Sodium 137    Lipid Panel    Component Value Date/Time   CHOL 217 (H) 07/15/2021 0908   CHOL 226 (H) 09/08/2019 1118   TRIG 82.0 07/15/2021 0908   HDL 152.10 07/15/2021 0908   HDL 145 09/08/2019 1118   CHOLHDL 1 07/15/2021 0908   VLDL 16.4 07/15/2021 0908   LDLCALC 48 07/15/2021 0908   LDLCALC 71 09/08/2019 1118   LDLDIRECT 58.8 08/04/2013 1102      Wt Readings from Last 3 Encounters:  10/11/21 170 lb 9.6 oz (77.4 kg)  09/13/21 169 lb 6.4 oz (76.8 kg)  07/29/21 168 lb (76.2 kg)      Other studies Reviewed: Additional studies/ records that were reviewed today include:  Labs Review of the above records demonstrates:   See elsewhere   ASSESSMENT AND PLAN:   ATRIAL FIB.  [name] has a  CHA2DS2 - VASc score of 3.  Her rate is elevated.  Labs were normal to include CBC and TSH this summer.  Given the increased heart rate and blood pressure I am going to start Cardizem CD1 120 mg daily.  She will then keep a heart rate and pressure diary.  Continue anticoagulation.   I will send her a 3-day Zio patch.  HTN:  The blood pressure  is going to be managed as above.   Current medicines are reviewed at length with the patient today.  The patient does not have concerns regarding medicines.  The following changes have been made: As above  Labs/ tests ordered today include:  None  Orders Placed This Encounter  Procedures   LONG TERM MONITOR (3-14 DAYS)      Disposition:   FU with 6  months.    Signed, Minus Breeding, MD  10/11/2021 5:33 PM    [place]

## 2021-10-11 ENCOUNTER — Ambulatory Visit: Payer: PPO | Admitting: Cardiology

## 2021-10-11 ENCOUNTER — Encounter: Payer: Self-pay | Admitting: Cardiology

## 2021-10-11 ENCOUNTER — Other Ambulatory Visit: Payer: Self-pay

## 2021-10-11 ENCOUNTER — Ambulatory Visit (INDEPENDENT_AMBULATORY_CARE_PROVIDER_SITE_OTHER): Payer: PPO

## 2021-10-11 VITALS — BP 150/100 | HR 134 | Ht 64.0 in | Wt 170.6 lb

## 2021-10-11 DIAGNOSIS — I480 Paroxysmal atrial fibrillation: Secondary | ICD-10-CM

## 2021-10-11 DIAGNOSIS — I10 Essential (primary) hypertension: Secondary | ICD-10-CM

## 2021-10-11 MED ORDER — DILTIAZEM HCL ER COATED BEADS 120 MG PO CP24: 120.0000 mg | ORAL_CAPSULE | Freq: Every day | ORAL | 3 refills | Status: DC

## 2021-10-11 NOTE — Progress Notes (Unsigned)
Enrolled patient for a 3 day Zio XT monitor to be mailed to patients home  

## 2021-10-11 NOTE — Patient Instructions (Signed)
Medication Instructions:   START DILTIAZEM CD 120 MG ONCE DAILY  *If you need a refill on your cardiac medications before your next appointment, please call your pharmacy*   Testing/Procedures:  [place] Monitor Instructions  Your physician has requested you wear a ZIO patch monitor for 3 days.  This is a single patch monitor. [employer] supplies one patch monitor per enrollment. Additional stickers are not available. Please do not apply patch if you will be having a Nuclear Stress Test,  Echocardiogram, Cardiac CT, MRI, or Chest Xray during the period you would be wearing the  monitor. The patch cannot be worn during these tests. You cannot remove and re-apply the  ZIO XT patch monitor.  Your ZIO patch monitor will be mailed 3 day USPS to your address on file. It may take 3-5 days  to receive your monitor after you have been enrolled.  Once you have received your monitor, please review the enclosed instructions. Your monitor  has already been registered assigning a specific monitor serial # to you.  Billing and Patient Assistance Program Information  We have supplied [employer] with any of your insurance information on file for billing purposes. [employer] offers a sliding scale Patient Assistance Program for patients that do not have  insurance, or whose insurance does not completely cover the cost of the ZIO monitor.  You must apply for the Patient Assistance Program to qualify for this discounted rate.  To apply, please call [employer] at [phone], select option 4, select option 2, ask to apply for  Patient Assistance Program. Theodore Demark will ask your household income, and how many people  are in your household. They will quote your out-of-pocket cost based on that information.  [employer] will also be able to set up a 78-month, interest-free payment plan if needed.  Applying the monitor   Shave hair from upper left chest.  Hold abrader disc by orange tab. Rub abrader in 40 strokes  over the upper left chest as  indicated in your monitor instructions.  Clean area with 4 enclosed alcohol pads. Let dry.  Apply patch as indicated in monitor instructions. Patch will be placed under collarbone on left  side of chest with arrow pointing upward.  Rub patch adhesive wings for 2 minutes. Remove white label marked "1". Remove the white  label marked "2". Rub patch adhesive wings for 2 additional minutes.  While looking in a mirror, press and release button in center of patch. A small green light will  flash 3-4 times. This will be your only indicator that the monitor has been turned on.  Do not shower for the first 24 hours. You may shower after the first 24 hours.  Press the button if you feel a symptom. You will hear a small click. Record Date, Time and  Symptom in the Patient Logbook.  When you are ready to remove the patch, follow instructions on the last 2 pages of Patient  Logbook. Stick patch monitor onto the last page of Patient Logbook.  Place Patient Logbook in the blue and white box. Use locking tab on box and tape box closed  securely. The blue and white box has prepaid postage on it. Please place it in the mailbox as  soon as possible. Your physician should have your test results approximately 7 days after the  monitor has been mailed back to [facility].  Call [place] at [phone] if you have questions regarding  your ZIO XT patch monitor. Call  them immediately if you see an orange light blinking on your  monitor.  If your monitor falls off in less than 4 days, contact our Monitor department at [phone].  If your monitor becomes loose or falls off after 4 days call [employer] at [phone] for  suggestions on securing your monitor    Follow-Up: At [facility], you and your health needs are our priority.  As part of our continuing mission to provide you with exceptional heart care, we have created designated Provider Care Teams.   These Care Teams include your primary Cardiologist (physician) and Advanced Practice Providers (APPs -  Physician Assistants and Nurse Practitioners) who all work together to provide you with the care you need, when you need it.  We recommend signing up for the patient portal called "MyChart".  Sign up information is provided on this After Visit Summary.  MyChart is used to connect with patients for Virtual Visits (Telemedicine).  Patients are able to view lab/test results, encounter notes, upcoming appointments, etc.  Non-urgent messages can be sent to your provider as well.   To learn more about what you can do with MyChart, go to [email or web address].    Your next appointment:   6 month(s)  The format for your next appointment:   In Person  Provider:   Minus Breeding, MD

## 2021-10-13 DIAGNOSIS — I480 Paroxysmal atrial fibrillation: Secondary | ICD-10-CM

## 2021-10-24 DIAGNOSIS — I480 Paroxysmal atrial fibrillation: Secondary | ICD-10-CM | POA: Diagnosis not present

## 2021-10-26 ENCOUNTER — Encounter: Payer: Self-pay | Admitting: Internal Medicine

## 2021-10-26 ENCOUNTER — Ambulatory Visit (INDEPENDENT_AMBULATORY_CARE_PROVIDER_SITE_OTHER): Payer: PPO | Admitting: Internal Medicine

## 2021-10-26 ENCOUNTER — Telehealth: Payer: PPO | Admitting: Physician Assistant

## 2021-10-26 VITALS — BP 130/90 | HR 111 | Temp 97.7°F | Wt 173.7 lb

## 2021-10-26 DIAGNOSIS — I10 Essential (primary) hypertension: Secondary | ICD-10-CM

## 2021-10-26 DIAGNOSIS — I480 Paroxysmal atrial fibrillation: Secondary | ICD-10-CM

## 2021-10-26 NOTE — Progress Notes (Signed)
Established Patient Office Visit     This visit occurred during the SARS-CoV-2 public health emergency.  Safety protocols were in place, including screening questions prior to the visit, additional usage of staff PPE, and extensive cleaning of exam room while observing appropriate contact time as indicated for disinfecting solutions.    CC/Reason for Visit: 6 week blood pressure follow-up  HPI: Margaret Mckenzie is a 70 y.o. female who is coming in today for the above mentioned reasons. Past Medical History is significant for: Hypertension, atrial fibrillation, bipolar disorder.  She was hospitalized for hyponatremia that was thought related to HCTZ and was taken off of it.  Ever since we have been having difficulty controlling her blood pressure.  At last visit on October 18 her lisinopril was increased from 10 to 20 mg.  She brings in her ambulatory measurements which I will insert below.  She saw her cardiologist 2 weeks ago who noted blood pressure remains elevated and heart rates were in the low 110s, she was placed on Cardizem CD 120 mg daily in addition to a Zio patch which she has not heard back about yet.  She feels well and has no acute concerns.  Since I last saw her she did have her flu vaccine and her COVID booster.      Past Medical/Surgical History: Past Medical History:  Diagnosis Date   Anxiety    Bipolar 1 disorder ([place])    Chicken pox    Depression    Hypertension    high blood pressure readings    Kidney disease    kidney stones and saw urologist, Dr. Edward Qualia for lithotripsy remotely    PAF (paroxysmal atrial fibrillation) [facility])     Past Surgical History:  Procedure Laterality Date   BUNIONECTOMY  2007   [place]    Social History:  reports that she quit smoking about 4 years ago. Her smoking use included cigarettes. She smoked an average of .5 packs per day. She has never used smokeless tobacco. She reports current  alcohol use of about 7.0 standard drinks per week. She reports that she does not use drugs.  Allergies: Allergies  Allergen Reactions   Trazodone And Nefazodone Other (See Comments)    Trazodone causes lethargy the morning after taking it   Soap Rash    Patients states soap at work drys out hands and has to use special cream to sooth skin.    Family History:  Family History  Problem Relation Age of Onset   Osteoporosis Mother    Stroke Mother        TIA, stoke   Mental retardation Mother    Hypertension Father    Heart disease Father 40   Stroke Father 29   Mental illness Sister    Breast cancer Neg Hx      Current Outpatient Medications:    ALPRAZolam (XANAX) 1 MG tablet, Take 1 tablet (1 mg total) by mouth 2 (two) times daily as needed for anxiety., Disp: 60 tablet, Rfl: 5   Ascorbic Acid (VITAMIN C) 1000 MG tablet, Take 1,000 mg by mouth at bedtime., Disp: , Rfl:    busPIRone (BUSPAR) 15 MG tablet, TAKE 1 TABLET BY MOUTH EVERY DAY (Patient taking differently: Take 15 mg by mouth in the morning.), Disp: 90 tablet, Rfl: 3   Calcium Carbonate (CALCIUM 600 PO), Take 1,200 mg by mouth at bedtime., Disp: , Rfl:    Cholecalciferol (VITAMIN D-3) 25 MCG (  1000 UT) CAPS, Take 1,000 Units by mouth at bedtime., Disp: , Rfl:    cyanocobalamin (,VITAMIN B-12,) 1000 MCG/ML injection, INJECT 1 ML (1,000 MCG TOTAL) INTO THE MUSCLE EVERY 30 DAYS. (Patient taking differently: Inject 1,000 mcg into the muscle every 30 (thirty) days.), Disp: 3 mL, Rfl: 3   diltiazem (CARDIZEM) 30 MG tablet, Take 1 tablet every 4 hours AS NEEDED for AFIB heart rate >100 (Patient taking differently: Take 30 mg by mouth every 4 (four) hours as needed (for A-Fib heart rate >100).), Disp: 45 tablet, Rfl: 1   ELIQUIS 5 MG TABS tablet, TAKE 1 TABLET BY MOUTH TWICE A DAY, Disp: 180 tablet, Rfl: 3   lamoTRIgine (LAMICTAL) 150 MG tablet, TAKE 2 TABLETS BY MOUTH EVERY DAY AT BEDTIME (Patient taking differently: Take 300 mg by  mouth at bedtime.), Disp: 180 tablet, Rfl: 2   lisinopril (ZESTRIL) 20 MG tablet, Take 1 tablet (20 mg total) by mouth daily., Disp: 90 tablet, Rfl: 1   Magnesium 500 MG TABS, Take 500 mg by mouth at bedtime., Disp: , Rfl:    risperiDONE (RISPERDAL) 3 MG tablet, Take 1 tablet (3 mg total) by mouth at bedtime., Disp: 90 tablet, Rfl: 2   traZODone (DESYREL) 50 MG tablet, TAKE 1 TO 2 TABLETS BY MOUTH AT BEDTIME AS NEEDED FOR SLEEP, Disp: 180 tablet, Rfl: 0   zolpidem (AMBIEN) 5 MG tablet, Take 0.5-1 tablets (2.5-5 mg total) by mouth at bedtime as needed for sleep., Disp: 30 tablet, Rfl: 1  Review of Systems:  Constitutional: Denies fever, chills, diaphoresis, appetite change and fatigue.  HEENT: Denies photophobia, eye pain, redness, hearing loss, ear pain, congestion, sore throat, rhinorrhea, sneezing, mouth sores, trouble swallowing, neck pain, neck stiffness and tinnitus.   Respiratory: Denies SOB, DOE, cough, chest tightness,  and wheezing.   Cardiovascular: Denies chest pain, palpitations and leg swelling.  Gastrointestinal: Denies nausea, vomiting, abdominal pain, diarrhea, constipation, blood in stool and abdominal distention.  Genitourinary: Denies dysuria, urgency, frequency, hematuria, flank pain and difficulty urinating.  Endocrine: Denies: hot or cold intolerance, sweats, changes in hair or nails, polyuria, polydipsia. Musculoskeletal: Denies myalgias, back pain, joint swelling, arthralgias and gait problem.  Skin: Denies pallor, rash and wound.  Neurological: Denies dizziness, seizures, syncope, weakness, light-headedness, numbness and headaches.  Hematological: Denies adenopathy. Easy bruising, personal or family bleeding history  Psychiatric/Behavioral: Denies suicidal ideation, mood changes, confusion, nervousness, sleep disturbance and agitation    Physical Exam: Vitals:   10/26/21 1300  BP: 130/90  Pulse: (!) 111  Temp: 97.7 F (36.5 C)  TempSrc: Oral  SpO2: 99%   Weight: 173 lb 11.2 oz (78.8 kg)    Body mass index is 29.82 kg/m.   Constitutional: NAD, calm, comfortable Eyes: PERRL, lids and conjunctivae normal, wears corrective lenses ENMT: Mucous membranes are moist.  Respiratory: clear to auscultation bilaterally, no wheezing, no crackles. Normal respiratory effort. No accessory muscle use.  Cardiovascular: Tachycardic, appears to be in a regular rhythm, no murmurs / rubs / gallops. No extremity edema. 2+ pedal pulses. No carotid bruits.  Neurologic: Grossly intact and nonfocal Psychiatric: Normal judgment and insight. Alert and oriented x 3. Normal mood.    Impression and Plan:  Paroxysmal atrial fibrillation (HCC) Essential hypertension -Blood pressures have significantly improved since adding Cardizem 120 mg daily. -Continue to follow.  Time spent: 21 minutes reviewing chart, interviewing and examining patient and formulating plan of care.   [name], MD [facility]

## 2021-11-03 ENCOUNTER — Encounter: Payer: Self-pay | Admitting: *Deleted

## 2021-11-03 ENCOUNTER — Telehealth: Payer: Self-pay | Admitting: Pharmacist

## 2021-11-03 NOTE — Chronic Care Management (AMB) (Signed)
Chronic Care Management Pharmacy Assistant   Name: Margaret Mckenzie  MRN: [id number] DOB: May 15, 1951  Margaret Mckenzie is an 70 y.o. year old female who presents for his initial CCM visit with the clinical pharmacist.  Reason for Encounter: Chart Prep for initial visit with Margaret Mckenzie the clinical pharmacist on 11/11/2021   Conditions to be addressed/monitored: Atrial Fibrillation and HTN  Recent office visits:  10/26/2021 Margaret Frohlich MD - Patient was seen for Paroxysmal atrial fibrillation and additional issues. Discontinued Diltiazem. Follow up in 6 months.  09/13/2021 Margaret Isaac Bliss MD - Patient was seen for Essential hypertension and an additional issue. Increased Lisinopril to 20 mg daily. Follow up in 6 weeks.  07/29/2021 Margaret Isaac Bliss MD - Patient was seen for hospital discharge and additional issues. Started Lisinopril 10 mg daily. Follow up in 6 weeks.  07/15/2021 Margaret Isaac Bliss MD - Patient was seen for preventative health exam and additional issues. No medication changes. Follow up in 1 year.  Recent consult visits:  10/11/2021 Margaret Breeding MD (cardiology) - Patient was seen for Paroxysmal atrial fibrillation and additional issues. Started Diltiazem 120 mg daily. No follow up noted.  10/03/2021 Margaret Moat PA-C (psychiatry) - Patient was seen for Bipolar disorder and additional issues. No medication changes. Follow up in 6 months.  08/03/2021 Margaret Moat PA-C (psychiatry) - Patient was seen for Insomnia and additional issues. Started Zolpidem 2.5 - 5 mg at bedtime prn. Follow up in 2 months.  Hospital visits:  Admitted to [facility] on 07/15/2021 due to Hyponatremia. Discharge date was 07/16/2021. Discharged from [place]?Medications Started at [facility] Discharge:?? -None Medication Changes at Hospital Discharge: -None Medications Discontinued at Hospital  Discharge: - None Medications that remain the same after Hospital Discharge:??  -All other medications will remain the same.    Medications: Outpatient Encounter Medications as of 11/03/2021  Medication Sig   ALPRAZolam (XANAX) 1 MG tablet Take 1 tablet (1 mg total) by mouth 2 (two) times daily as needed for anxiety.   Ascorbic Acid (VITAMIN C) 1000 MG tablet Take 1,000 mg by mouth at bedtime.   busPIRone (BUSPAR) 15 MG tablet TAKE 1 TABLET BY MOUTH EVERY DAY (Patient taking differently: Take 15 mg by mouth in the morning.)   Calcium Carbonate (CALCIUM 600 PO) Take 1,200 mg by mouth at bedtime.   Cholecalciferol (VITAMIN D-3) 25 MCG (1000 UT) CAPS Take 1,000 Units by mouth at bedtime.   cyanocobalamin (,VITAMIN B-12,) 1000 MCG/ML injection INJECT 1 ML (1,000 MCG TOTAL) INTO THE MUSCLE EVERY 30 DAYS. (Patient taking differently: Inject 1,000 mcg into the muscle every 30 (thirty) days.)   diltiazem (CARDIZEM) 30 MG tablet Take 1 tablet every 4 hours AS NEEDED for AFIB heart rate >100 (Patient taking differently: Take 30 mg by mouth every 4 (four) hours as needed (for A-Fib heart rate >100).)   ELIQUIS 5 MG TABS tablet TAKE 1 TABLET BY MOUTH TWICE A DAY   lamoTRIgine (LAMICTAL) 150 MG tablet TAKE 2 TABLETS BY MOUTH EVERY DAY AT BEDTIME (Patient taking differently: Take 300 mg by mouth at bedtime.)   lisinopril (ZESTRIL) 20 MG tablet Take 1 tablet (20 mg total) by mouth daily.   Magnesium 500 MG TABS Take 500 mg by mouth at bedtime.   risperiDONE (RISPERDAL) 3 MG tablet Take 1 tablet (3 mg total) by mouth at bedtime.   traZODone (DESYREL) 50 MG tablet TAKE 1 TO 2 TABLETS BY  MOUTH AT BEDTIME AS NEEDED FOR SLEEP   zolpidem (AMBIEN) 5 MG tablet Take 0.5-1 tablets (2.5-5 mg total) by mouth at bedtime as needed for sleep.   No facility-administered encounter medications on file as of 11/03/2021.  Fill History: ALPRAZOLAM 1 MG TABLET 10/25/2021 30   ELIQUIS 5 MG TABLET 10/03/2021 90   CYANOCOBALAMIN  1,000 MCG/ML VL 10/29/2021 84   LISINOPRIL 20 MG TABLET 09/13/2021 90   ZOLPIDEM TARTRATE 5 MG TABLET 08/03/2021 30   BUSPIRONE HCL 15 MG TABLET 09/03/2021 90   DILTIAZEM HYDROCHLORIDE ER 120MG  CAPSULE EXTENDED RELEASE 24 HOUR 10/11/2021 90   LAMOTRIGINE 150 MG TABLET 08/18/2021 90   RISPERIDONE 3 MG TABLET 10/21/2021 90   TRAZODONE HYDROCHLORIDE 50MG  TABLET 08/02/2021 90   Have you seen any other providers since your last visit? **no  Any changes in your medications or health? no  Any side effects from any medications? no  Do you have an symptoms or problems not managed by your medications? no  Any concerns about your health right now? No  Has your provider asked that you check blood pressure, blood sugar, or follow special diet at home? Yes, patients cardiologist has her checking her blood pressures regular.   Do you get any type of exercise on a regular basis? Yes, works as a Metallurgist.   Can you think of a goal you would like to reach for your health? She would like to loose a few lbs.  Do you have any problems getting your medications? No however Eliquis is quite expensive  Is there anything that you would like to discuss during the appointment? No  Please bring blood pressure monitor,  medications and supplements to appointment  Care Gaps: AWV - msg sent to Ramond Craver to schedule Last BP - 130/90 on 10/26/2021  Star Rating Drugs: Lisinopril 20 mg - last filled 09/13/2021 90 DS at [place] Pharmacist Assistant [phone]

## 2021-11-09 ENCOUNTER — Telehealth: Payer: Self-pay | Admitting: Pharmacist

## 2021-11-09 NOTE — Chronic Care Management (AMB) (Signed)
° ° °  Chronic Care Management Pharmacy Assistant   Name: Dechelle Attaway  MRN: [id number] DOB: 1951-05-22  11/11/2021 APPOINTMENT REMINDER  Called Ross Marcus, No answer, left message of appointment on 11/11/2021 at 9:30 via office visit with [name], Pharm D. Notified to have blood pressure monitor,all medications, supplements, blood pressure and/or blood sugar logs available during appointment and to return call if need to reschedule.  Care Gaps: AWV - msg sent to Ramond Craver to schedule Last BP - 130/90 on 10/26/2021  Star Rating Drug: Lisinopril 20 mg - last filled 09/13/2021 90 DS at [facility]  Any gaps in medications fill history? No  Gennie Alma [facility]  Catering manager [phone]

## 2021-11-11 ENCOUNTER — Ambulatory Visit (INDEPENDENT_AMBULATORY_CARE_PROVIDER_SITE_OTHER): Payer: PPO | Admitting: Pharmacist

## 2021-11-11 VITALS — BP 140/70

## 2021-11-11 DIAGNOSIS — I480 Paroxysmal atrial fibrillation: Secondary | ICD-10-CM

## 2021-11-11 DIAGNOSIS — I10 Essential (primary) hypertension: Secondary | ICD-10-CM

## 2021-11-11 NOTE — Patient Instructions (Addendum)
Hi Margaret Mckenzie,  It was great to get to meet you in person! Below is a summary of some of the topics we discussed.  Here are some of the highlights: -Decrease vitamin C to 500 mg per day to minimize your risk for kidney stones -Decrease vitamin D to 2000 units per day -Switch to calcium citrate once you finish up what you have and take it twice daily rather than at the same time -Try to use less of the afternoon Xanax by either cutting it in half or not taking it to see if you can use less of it over time -Don't forget to call cardiology about your HR being above 100 and you should be taking the extra diltiazem for those instances  Please reach out to me if you have any questions or need anything before our follow up!  Best, Maddie  [name], PharmD, [facility]  Visit Information   Goals Addressed   None    Patient Care Plan: CCM Pharmacy Care Plan     Problem Identified: Problem: Hypertension, Atrial Fibrillation, Anxiety, and Bipolar disorder and Insomnia      Long-Range Goal: Patient-Specific Goal   Start Date: 11/11/2021  Expected End Date: 11/11/2022  This Visit's Progress: On track  Priority: High  Note:   Current Barriers:  Unable to independently monitor therapeutic efficacy  Pharmacist Clinical Goal(s):  Patient will achieve adherence to monitoring guidelines and medication adherence to achieve therapeutic efficacy through collaboration with PharmD and provider.   Interventions: 1:1 collaboration with Isaac Bliss, Rayford Halsted, MD regarding development and update of comprehensive plan of care as evidenced by provider attestation and co-signature Inter-disciplinary care team collaboration (see longitudinal plan of care) Comprehensive medication review performed; medication list updated in electronic medical record  Hypertension (BP goal <130/80) -Not ideally controlled -Current  treatment: Lisinopril 20 mg 1 tablet daily - in AM Diltiazem 120 mg 1 capsule daily - in AM -Medications previously tried: n/a  -Current home readings: 130/82, 115/67, 121/69, 125/73, 133/85, 114/71, 170/111, 128/74 - wrist cuff (every day)  -Current dietary habits: doesn't add a lot of salt but doesn't look at sodium on package labels; no canned foods and mostly frozen or fresh vegetables; doesn't eat out often -Current exercise habits: on her feet at work but no formal exercise -Denies hypotensive/hypertensive symptoms -Educated on BP goals and benefits of medications for prevention of heart attack, stroke and kidney damage; Importance of home blood pressure monitoring; Proper BP monitoring technique; Symptoms of hypotension and importance of maintaining adequate hydration; -Counseled to monitor BP at home at least weekly, document, and provide log at future appointments -Counseled on diet and exercise extensively Recommended to continue current medication  Atrial Fibrillation (Goal: prevent stroke and major bleeding) -Controlled -CHADSVASC: 3 -Current treatment: Rate control: Diltiazem 120 1 capsule  Anticoagulation: Eliquis 5 mg 1 tablet twice daily -Medications previously tried: n/a -Home BP and HR readings: 103, 105, 112  -Counseled on increased risk of stroke due to Afib and benefits of anticoagulation for stroke prevention; importance of adherence to anticoagulant exactly as prescribed; bleeding risk associated with Eliquis and importance of self-monitoring for signs/symptoms of bleeding; avoidance of NSAIDs due to increased bleeding risk with anticoagulants; -Recommended to continue current medication Counseled on trigger for Afib such as caffeine and chocolate. Assessed patient finances. Patient likely will qualify for PAP. Plan to apply in 2023.  Bipolar disorder/Anxiety (Goal: minimize symptoms) -Not ideally controlled -Current treatment: Alprazolam 1 mg  1 tablet twice  daily as needed (every day twice a day) Buspirone 15 mg 1 tablet daily  Lamotrigine 150 mg 2 tablets at bedtime Risperidone 3 mg 1 tablet at bedtime -Medications previously tried/failed: Depakote, Lamictal, Prozac, Lexapro -PHQ9: 1 -GAD7: n/a -Educated on Benefits of medication for symptom control Benefits of cognitive-behavioral therapy with or without medication -Counseled on risks for long term use of benzodiazpines such as memory loss, falls, and fractures. Recommended limiting [place] of Xanax and recommended slowly trying to self taper to as needed. Educated on benefits of high intensity exercise for anxiety management.  Insomnia (Goal: improve quality and quantity of sleep) -Not ideally controlled -Current treatment  Trazodone 50 mg 1-2 tablets at bedtime as needed Zolpidem 5 mg 1/2 - 1 tablet at bedtime as needed -Medications previously tried: Quarry manager (caused amnesia),  trazodone (ineffective at low doses but caused dizziness at higher doses) -Counseled on practicing good sleep hygiene by setting a sleep schedule and maintaining it, avoid excessive napping, following a nightly routine, avoiding screen time for 30-60 minutes before going to bed, and making the bedroom a cool, quiet and dark space  Recommended using Zolpidem sparingly.  Vitamin B12 deficiency (Goal: 211-911) -Controlled -Current treatment  Vitamin B12 inject 1000 mcg every 30 days -Medications previously tried: none  -Recommended repeat vitamin B12 level.  Osteopenia (Goal prevent fractures) -Controlled -Last DEXA Scan: 07/22/21   T-Score femoral neck: -2.3  T-Score total hip: n/a  T-Score lumbar spine: -0.8  T-Score forearm radius: n/a  10-year probability of major osteoporotic fracture: 13.0%  10-year probability of hip fracture: 2.8% -Patient is not a candidate for pharmacologic treatment -Current treatment  Vitamin D 1000 units at bedtime Calcium carbonate 600 mg & vitamin D (1000 mcg) 2 tablets at  bedtime -Medications previously tried: none  -Recommend [phone] units of vitamin D daily. Recommend 1200 mg of calcium daily from dietary and supplemental sources. Recommend weight-bearing and muscle strengthening exercises for building and maintaining bone density. -Recommended decreasing vitamin D to 2000 units per day and separating calcium to twice daily.   Health Maintenance -Vaccine gaps:none -Current therapy:  Magnesium 500 mg 1 tablet at bedtime Vitamin C 1000 mg 1 tablet at bedtime -Educated on Cost vs benefit of each product must be carefully weighed by individual consumer Supplements may interfere with prescription drugs -Patient is satisfied with current therapy and denies issues -Recommended to continue current medication  Patient Goals/Self-Care Activities Patient will:  - take medications as prescribed as evidenced by patient report and record review check blood pressure at least weekly, document, and provide at future appointments target a minimum of 150 minutes of moderate intensity exercise weekly  Follow Up Plan: Telephone follow up appointment with care management team member scheduled for: 6 months      Ms. Galeas was given information about Chronic Care Management services today including:  CCM service includes personalized support from designated clinical staff supervised by her physician, including individualized plan of care and coordination with other care providers 24/7 contact phone numbers for assistance for urgent and routine care needs. Standard insurance, coinsurance, copays and deductibles apply for chronic care management only during months in which we provide at least 20 minutes of these services. Most insurances cover these services at 100%, however patients may be responsible for any copay, coinsurance and/or deductible if applicable. This service may help you avoid the need for more expensive face-to-face services. Only one practitioner may  furnish and bill the service in a calendar month.  The patient may stop CCM services at any time (effective at the end of the month) by phone call to the office staff.  Patient agreed to services and verbal consent obtained.   Patient verbalizes understanding of instructions provided today and agrees to view in [place].  Telephone follow up appointment with pharmacy team member scheduled for: 6 months  Viona Gilmore, [facility]

## 2021-11-11 NOTE — Progress Notes (Signed)
Chronic Care Management Pharmacy Note  11/27/2021 Name:  Margaret Mckenzie MRN:  [id number] DOB:  10/05/1951  Summary: BP at goal < 140/90 HR is > 100 most days  Recommendations/Changes made from today's visit: -Trial of 1/2 of alprazolam in the afternoon to see how she tolerates -Recommended decreasing vitamin D to 2000 units per day -Recommended decreasing vitamin C to 500 mg per day -Recommended follow up with cardiology for HR > 100 -Recommend repeat vitamin B12 level  Plan: Apply for Eliquis PAP in 2023 BP assessment in 2 months  Subjective: Margaret Mckenzie is an 70 y.o. year old female who is a primary patient of Margaret Mckenzie, Margaret Halsted, MD.  The CCM team was consulted for assistance with disease management and care coordination needs.    Engaged with patient face to face for initial visit in response to provider referral for pharmacy case management and/or care coordination services.   Consent to Services:  The patient was given the following information about Chronic Care Management services today, agreed to services, and gave verbal consent: 1. CCM service includes personalized support from designated clinical staff supervised by the primary care provider, including individualized plan of care and coordination with other care providers 2. 24/7 contact phone numbers for assistance for urgent and routine care needs. 3. Service will only be billed when office clinical staff spend 20 minutes or more in a month to coordinate care. 4. Only one practitioner may furnish and bill the service in a calendar month. 5.The patient may stop CCM services at any time (effective at the end of the month) by phone call to the office staff. 6. The patient will be responsible for cost sharing (co-pay) of up to 20% of the service fee (after annual deductible is met). Patient agreed to services and consent obtained.  Patient Care Team: Margaret Mckenzie, Margaret Halsted, MD as PCP - General  (Internal Medicine) Margaret Breeding, MD as PCP - Cardiology (Cardiology) Group, [facility]) Margaret Mckenzie, [facility] as Pharmacist (Pharmacist)  Recent office visits: 10/26/2021 Margaret Frohlich MD - Patient was seen for Paroxysmal atrial fibrillation and additional issues. Discontinued Diltiazem. Follow up in 6 months.   09/13/2021 Margaret Margaret Bliss MD - Patient was seen for Essential hypertension and an additional issue. Increased Lisinopril to 20 mg daily. Follow up in 6 weeks.   07/29/2021 Margaret Margaret Bliss MD - Patient was seen for hospital discharge and additional issues. Started Lisinopril 10 mg daily. Follow up in 6 weeks.   07/15/2021 Margaret Margaret Bliss MD - Patient was seen for preventative health exam and additional issues. No medication changes. Follow up in 1 year.  Recent consult visits: 10/11/2021 Margaret Breeding MD (cardiology) - Patient was seen for Paroxysmal atrial fibrillation and additional issues. Started Diltiazem 120 mg daily. No follow up noted.   10/03/2021 Margaret Moat PA-C (psychiatry) - Patient was seen for Bipolar disorder and additional issues. No medication changes. Follow up in 6 months.   08/03/2021 Margaret Moat PA-C (psychiatry) - Patient was seen for Insomnia and additional issues. Started Zolpidem 2.5 - 5 mg at bedtime prn. Follow up in 2 months.  Hospital visits: Admitted to [facility] on 07/15/2021 due to Hyponatremia. Discharge date was 07/16/2021. Discharged from [place]?Medications Started at [facility] Discharge:?? -None Medication Changes at Hospital Discharge: -None Medications Discontinued at Hospital Discharge: - None Medications that remain the same after Hospital Discharge:??  -All other medications will  remain the same.   Objective:  Lab Results  Component Value Date   CREATININE 0.72 07/29/2021   BUN 11 07/29/2021   GFR 85.42  07/29/2021   GFRNONAA >60 07/16/2021   GFRAA 99 09/08/2019   NA 137 07/29/2021   K 3.9 07/29/2021   CALCIUM 10.2 07/29/2021   CO2 29 07/29/2021   GLUCOSE 94 07/29/2021    Lab Results  Component Value Date/Time   HGBA1C 5.1 07/15/2021 09:08 AM   HGBA1C 5.2 04/23/2020 08:24 AM   HGBA1C 5.0 06/04/2017 12:00 AM   GFR 85.42 07/29/2021 01:17 PM   GFR 77.62 07/15/2021 09:08 AM    Last diabetic Eye exam: No results found for: HMDIABEYEEXA  Last diabetic Foot exam: No results found for: HMDIABFOOTEX   Lab Results  Component Value Date   CHOL 217 (H) 07/15/2021   HDL 152.10 07/15/2021   LDLCALC 48 07/15/2021   LDLDIRECT 58.8 08/04/2013   TRIG 82.0 07/15/2021   CHOLHDL 1 07/15/2021    Hepatic Function Latest Ref Rng & Units 07/15/2021 04/23/2020 03/02/2020  Total Protein 6.0 - 8.3 g/dL 7.4 6.6 6.8  Albumin 3.5 - 5.2 g/dL 4.5 4.4 4.4  AST 0 - 37 U/L 35 14 16  ALT 0 - 35 U/L '27 14 11  ' Alk Phosphatase 39 - 117 U/L 80 45 57  Total Bilirubin 0.2 - 1.2 mg/dL 1.5(H) 0.7 0.3    Lab Results  Component Value Date/Time   TSH 1.998 07/15/2021 03:22 PM   TSH 2.46 07/15/2021 09:08 AM   TSH 1.67 04/23/2020 08:24 AM    CBC Latest Ref Rng & Units 07/16/2021 07/15/2021 04/23/2020  WBC 4.0 - 10.5 K/uL 6.1 6.4 7.3  Hemoglobin 12.0 - 15.0 g/dL 13.0 13.5 12.4  Hematocrit 36.0 - 46.0 % 37.4 39.5 37.1  Platelets 150 - 400 K/uL 235 250.0 284.0    Lab Results  Component Value Date/Time   VD25OH 55.77 07/15/2021 09:08 AM   VD25OH 54.57 04/23/2020 08:24 AM    Clinical ASCVD: No  The ASCVD Risk score (Arnett DK, et al., 2019) failed to calculate for the following reasons:   The valid HDL cholesterol range is 20 to 100 mg/dL    Depression screen [facility] 2/9 10/26/2021 07/15/2021 04/23/2020  Decreased Interest 0 0 0  Down, Depressed, Hopeless 0 0 0  PHQ - 2 Score 0 0 0  Altered sleeping 1 0 0  Tired, decreased energy 0 0 0  Change in appetite 0 0 0  Feeling bad or failure about yourself  0 0 0   Trouble concentrating 0 0 0  Moving slowly or fidgety/restless 0 0 0  Suicidal thoughts 0 0 0  PHQ-9 Score 1 0 0  Difficult doing work/chores Not difficult at all Not difficult at all Not difficult at all    CHA2DS2/VAS Stroke Risk Points  Current as of 13 minutes ago     3 >= 2 Points: High Risk  1 - 1.99 Points: Medium Risk  0 Points: Low Risk    Last Change: N/A      Details    This score determines the patient's risk of having a stroke if the  patient has atrial fibrillation.       Points Metrics  0 Has Congestive Heart Failure:  No    Current as of 13 minutes ago  0 Has Vascular Disease:  No    Current as of 13 minutes ago  1 Has Hypertension:  Yes    Current as  of 13 minutes ago  1 Age:  52    Current as of 13 minutes ago  0 Has Diabetes:  No    Current as of 13 minutes ago  0 Had Stroke:  No  Had TIA:  No  Had Thromboembolism:  No    Current as of 13 minutes ago  1 Female:  Yes    Current as of 13 minutes ago       Social History   Tobacco Use  Smoking Status Former   Packs/day: 0.50   Types: Cigarettes   Quit date: 07/11/2017   Years since quitting: 4.3  Smokeless Tobacco Never  Tobacco Comments   pt has quit smoking   BP Readings from Last 3 Encounters:  11/11/21 140/70  10/26/21 130/90  10/11/21 (!) 150/100   Pulse Readings from Last 3 Encounters:  10/26/21 (!) 111  10/11/21 (!) 134  09/13/21 80   Wt Readings from Last 3 Encounters:  10/26/21 173 lb 11.2 oz (78.8 kg)  10/11/21 170 lb 9.6 oz (77.4 kg)  09/13/21 169 lb 6.4 oz (76.8 kg)   BMI Readings from Last 3 Encounters:  10/26/21 29.82 kg/m  10/11/21 29.28 kg/m  09/13/21 29.08 kg/m    Assessment/Interventions: Review of patient past medical history, allergies, medications, health status, including review of consultants reports, laboratory and other test data, was performed as part of comprehensive evaluation and provision of chronic care management services.   SDOH:  (Social  Determinants of Health) assessments and interventions performed: Yes SDOH Interventions    Flowsheet Row Most Recent Value  SDOH Interventions   Financial Strain Interventions Other (Comment)  [will apply for Eliquis PAP in 2023]  Transportation Interventions Intervention Not Indicated      SDOH Screenings   Alcohol Screen: Not on file  Depression (PHQ2-9): Low Risk    PHQ-2 Score: 1  Financial Resource Strain: Low Risk    Difficulty of Paying Living Expenses: Not very hard  Food Insecurity: Not on file  Housing: Not on file  Physical Activity: Not on file  Social Connections: Not on file  Stress: Not on file  Tobacco Use: Medium Risk   Smoking Tobacco Use: Former   Smokeless Tobacco Use: Never   Passive Exposure: Not on file  Transportation Needs: No Transportation Needs   Lack of Transportation (Medical): No   Lack of Transportation (Non-Medical): No   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 She still enjoys doing this but knows she cannot do more than that. She usually comes back home afterwards to relax and takes about 1/2 hour nap every day. On Fridays, she does a lot of errands such as going to [place] and usually schedules doctor's appointments.  Patient is sleeping well right now with using 1/2 tablet of trazodone. She uses the calm app and listens to sleep stories every night and that is really helping. Her problem sometimes now is staying asleep and sometimes takes an extra half tablet of the trazodone. She doesn't want to try the Ambien as she is worried about amnesia she had with [place]. She goes to bed around 7pm every night.  She walks at work doing about 4 miles a day at work and on the weekends she walks a mile each day.  Patient denies any problems with her medications other than the cost of Eliquis and she just paid > $300 for a 3 month supply.  CCM Care  Plan  Allergies  Allergen  Reactions   Trazodone And Nefazodone Other (See Comments)    Trazodone causes lethargy the morning after taking it   Soap Rash    Patients states soap at work drys out hands and has to use special cream to sooth skin.    Medications Reviewed Today     Reviewed by Margaret Mckenzie, [facility] (Pharmacist) on 11/11/21 at 1023  Med List Status: <None>   Medication Order Taking? Sig Documenting Provider Last Dose Status Informant  ALPRAZolam (XANAX) 1 MG tablet [id number] Yes Take 1 tablet (1 mg total) by mouth 2 (two) times daily as needed for anxiety. Addison Lank, PA-C Taking Active   Ascorbic Acid (VITAMIN C) 1000 MG tablet [id number] Yes Take 1,000 mg by mouth at bedtime. [provider] Taking Active Self  busPIRone (BUSPAR) 15 MG tablet [id number] Yes TAKE 1 TABLET BY MOUTH EVERY DAY  Patient taking differently: Take 15 mg by mouth in the morning.   Addison Lank, PA-C Taking Active   Calcium Carbonate (CALCIUM 600 PO) [id number] Yes Take 1,200 mg by mouth at bedtime. [provider] Taking Active Self  Cholecalciferol (VITAMIN D-3) 25 MCG (1000 UT) CAPS [id number] Yes Take 4,000 Units by mouth at bedtime. [provider] Taking Active Self  cyanocobalamin (,VITAMIN B-12,) 1000 MCG/ML injection [id number] Yes INJECT 1 ML (1,000 MCG TOTAL) INTO THE MUSCLE EVERY 30 DAYS.  Patient taking differently: Inject 1,000 mcg into the muscle every 30 (thirty) days.   Margaret Mckenzie, Margaret Halsted, MD Taking Active Self  diltiazem (CARDIZEM) 30 MG tablet [id number] No Take 1 tablet every 4 hours AS NEEDED for AFIB heart rate >100  Patient not taking: Reported on 11/11/2021   Margaret Breeding, MD Not Taking Active   diltiazem (DILACOR XR) 120 MG 24 hr capsule [id number] Yes Take 120 mg by mouth daily. [provider] Taking Active   ELIQUIS 5 MG TABS tablet [id number] Yes TAKE 1 TABLET BY MOUTH TWICE A Kelton Pillar, MD Taking Active   lamoTRIgine (LAMICTAL) 150 MG tablet  [id number] Yes TAKE 2 TABLETS BY MOUTH EVERY DAY AT BEDTIME  Patient taking differently: Take 300 mg by mouth at bedtime.   Addison Lank, PA-C Taking Active   lisinopril (ZESTRIL) 20 MG tablet [id number] Yes Take 1 tablet (20 mg total) by mouth daily. Margaret Mckenzie, Margaret Halsted, MD Taking Active   Magnesium 400 MG CAPS [id number] Yes Take 400 mg by mouth at bedtime. [provider] Taking Active Self  risperiDONE (RISPERDAL) 3 MG tablet [id number] Yes Take 1 tablet (3 mg total) by mouth at bedtime. Addison Lank, PA-C Taking Active Self  traZODone (DESYREL) 50 MG tablet [id number] Yes TAKE 1 TO 2 TABLETS BY MOUTH AT BEDTIME AS NEEDED FOR SLEEP Adelene Idler, Teresa T, PA-C Taking Active   zolpidem (AMBIEN) 5 MG tablet [id number] Yes Take 0.5-1 tablets (2.5-5 mg total) by mouth at bedtime as needed for sleep. Addison Lank, [place] Taking Active             Patient Active Problem List   Diagnosis Date Noted   Hyponatremia 07/15/2021   Generalized anxiety disorder 10/25/2020   Panic disorder 10/25/2020   Bipolar I disorder ([place]) 10/25/2020   Insomnia 10/25/2020   Vitamin B12 deficiency 04/23/2020   Paroxysmal atrial fibrillation ([place]) 08/31/2017   Essential hypertension 08/31/2017    Immunization History  Administered Date(s) Administered   Fluad Quad(high Dose 65+) 07/31/2019, 07/29/2021   Influenza, High  Dose Seasonal PF 09/03/2017, 09/10/2018   Influenza,inj,Quad PF,6+ Mos 08/04/2013   Influenza-Unspecified 07/31/2019, 08/11/2020   PFIZER(Purple Top)SARS-COV-2 Vaccination 01/31/2020, 03/02/2020, 10/29/2020, 01/25/2021, 09/19/2021   Pneumococcal Conjugate-13 09/03/2017   Pneumococcal Polysaccharide-23 09/10/2018   Tdap 06/17/2013   Zoster Recombinat (Shingrix) 04/27/2017, 11/27/2017   Zoster, Live 08/14/2017    Conditions to be addressed/monitored:  Hypertension, Atrial Fibrillation, Anxiety, and Bipolar disorder and Insomnia  Care Plan : [place]  Updates  made by Margaret Mckenzie, [place] since 11/27/2021 12:00 AM     Problem: Problem: Hypertension, Atrial Fibrillation, Anxiety, and Bipolar disorder and Insomnia      Long-Range Goal: Patient-Specific Goal   Start Date: 11/11/2021  Expected End Date: 11/11/2022  This Visit's Progress: On track  Priority: High  Note:   Current Barriers:  Unable to independently monitor therapeutic efficacy  Pharmacist Clinical Goal(s):  Patient will achieve adherence to monitoring guidelines and medication adherence to achieve therapeutic efficacy through collaboration with PharmD and provider.   Interventions: 1:1 collaboration with Margaret Mckenzie, Margaret Halsted, MD regarding development and update of comprehensive plan of care as evidenced by provider attestation and co-signature Inter-disciplinary care team collaboration (see longitudinal plan of care) Comprehensive medication review performed; medication list updated in electronic medical record  Hypertension (BP goal <130/80) -Not ideally controlled -Current treatment: Lisinopril 20 mg 1 tablet daily - in AM Diltiazem 120 mg 1 capsule daily - in AM -Medications previously tried: n/a  -Current home readings: 130/82, 115/67, 121/69, 125/73, 133/85, 114/71, 170/111, 128/74 - wrist cuff (every day)  -Current dietary habits: doesn't add a lot of salt but doesn't look at sodium on package labels; no canned foods and mostly frozen or fresh vegetables; doesn't eat out often -Current exercise habits: on her feet at work but no formal exercise -Denies hypotensive/hypertensive symptoms -Educated on BP goals and benefits of medications for prevention of heart attack, stroke and kidney damage; Importance of home blood pressure monitoring; Proper BP monitoring technique; Symptoms of hypotension and importance of maintaining adequate hydration; -Counseled to monitor BP at home at least weekly, document, and provide log at future appointments -Counseled on diet and  exercise extensively Recommended to continue current medication  Atrial Fibrillation (Goal: prevent stroke and major bleeding) -Controlled -CHADSVASC: 3 -Current treatment: Rate control: Diltiazem 120 1 capsule  Anticoagulation: Eliquis 5 mg 1 tablet twice daily -Medications previously tried: n/a -Home BP and HR readings: 103, 105, 112  -Counseled on increased risk of stroke due to Afib and benefits of anticoagulation for stroke prevention; importance of adherence to anticoagulant exactly as prescribed; bleeding risk associated with Eliquis and importance of self-monitoring for signs/symptoms of bleeding; avoidance of NSAIDs due to increased bleeding risk with anticoagulants; -Recommended to continue current medication Counseled on trigger for Afib such as caffeine and chocolate. Assessed patient finances. Patient likely will qualify for PAP. Plan to apply in 2023.  Bipolar disorder/Anxiety (Goal: minimize symptoms) -Not ideally controlled -Current treatment: Alprazolam 1 mg 1 tablet twice daily as needed (every day twice a day) Buspirone 15 mg 1 tablet daily  Lamotrigine 150 mg 2 tablets at bedtime Risperidone 3 mg 1 tablet at bedtime -Medications previously tried/failed: Depakote, Lamictal, Prozac, Lexapro -PHQ9: 1 -GAD7: n/a -Educated on Benefits of medication for symptom control Benefits of cognitive-behavioral therapy with or without medication -Counseled on risks for long term use of benzodiazpines such as memory loss, falls, and fractures. Recommended limiting [place] of Xanax and recommended slowly trying to self taper to as needed. Educated  on benefits of high intensity exercise for anxiety management.  Insomnia (Goal: improve quality and quantity of sleep) -Not ideally controlled -Current treatment  Trazodone 50 mg 1-2 tablets at bedtime as needed Zolpidem 5 mg 1/2 - 1 tablet at bedtime as needed -Medications previously tried: Quarry manager (caused amnesia),  trazodone  (ineffective at low doses but caused dizziness at higher doses) -Counseled on practicing good sleep hygiene by setting a sleep schedule and maintaining it, avoid excessive napping, following a nightly routine, avoiding screen time for 30-60 minutes before going to bed, and making the bedroom a cool, quiet and dark space  Recommended using Zolpidem sparingly.  Vitamin B12 deficiency (Goal: 211-911) -Controlled -Current treatment  Vitamin B12 inject 1000 mcg every 30 days -Medications previously tried: none  -Recommended repeat vitamin B12 level.  Osteopenia (Goal prevent fractures) -Controlled -Last DEXA Scan: 07/22/21   T-Score femoral neck: -2.3  T-Score total hip: n/a  T-Score lumbar spine: -0.8  T-Score forearm radius: n/a  10-year probability of major osteoporotic fracture: 13.0%  10-year probability of hip fracture: 2.8% -Patient is not a candidate for pharmacologic treatment -Current treatment  Vitamin D 1000 units at bedtime Calcium carbonate 600 mg & vitamin D (1000 mcg) 2 tablets at bedtime -Medications previously tried: none  -Recommend [phone] units of vitamin D daily. Recommend 1200 mg of calcium daily from dietary and supplemental sources. Recommend weight-bearing and muscle strengthening exercises for building and maintaining bone density. -Recommended decreasing vitamin D to 2000 units per day and separating calcium to twice daily.   Health Maintenance -Vaccine gaps:none -Current therapy:  Magnesium 500 mg 1 tablet at bedtime Vitamin C 1000 mg 1 tablet at bedtime -Educated on Cost vs benefit of each product must be carefully weighed by individual consumer Supplements may interfere with prescription drugs -Patient is satisfied with current therapy and denies issues -Recommended to continue current medication  Patient Goals/Self-Care Activities Patient will:  - take medications as prescribed as evidenced by patient report and record review check blood pressure  at least weekly, document, and provide at future appointments target a minimum of 150 minutes of moderate intensity exercise weekly  Follow Up Plan: Telephone follow up appointment with care management team member scheduled for: 6 months       Medication Assistance: None required.  Patient affirms current coverage meets needs.  Compliance/Adherence/Medication fill history: Care Gaps: Last BP - 130/90 on 10/26/2021  [facility]: Lisinopril 20 mg - last [facility]/13/2021 [facility]  Patient's preferred [facility]:  [id number] [place], [place] [address]Phone: [phone]: [phone] Uses pill box? Yes - weekly Pt endorses 99% compliance  We discussed: Current pharmacy is preferred with insurance plan and patient is satisfied with pharmacy services Patient decided to: Continue current medication management strategy  Care Plan and Follow Up Patient Decision:  Patient agrees to Care Plan and Follow-up.  Plan: Telephone follow up appointment with care management team member scheduled for:  6 months  MJeni Salles PharmD, BStrubleat BFort Pierce [phone]

## 2021-11-14 ENCOUNTER — Encounter: Payer: Self-pay | Admitting: Cardiology

## 2021-11-18 ENCOUNTER — Ambulatory Visit: Payer: PPO | Admitting: Internal Medicine

## 2021-11-20 IMAGING — MR MR HEAD WO/W CM
15 series · 45 of 48 positions shown · IV contrast (multihance)
Comparison: None.

CLINICAL DATA: Dizziness. Left-sided tinnitus and hearing loss.
Ataxia.

EXAM:
MRI HEAD WITHOUT AND WITH CONTRAST
TECHNIQUE: Multiplanar, multiecho pulse sequences of the brain and surrounding
structures were obtained without and with intravenous contrast.
CONTRAST:  13mL MULTIHANCE GADOBENATE DIMEGLUMINE 529 MG/ML IV SOLN

[Series 2: T1 · sagittal · 5.0mm · 0.45mm/px · 1 of 23 slices shown (1 of 3)]
[im 1/23]
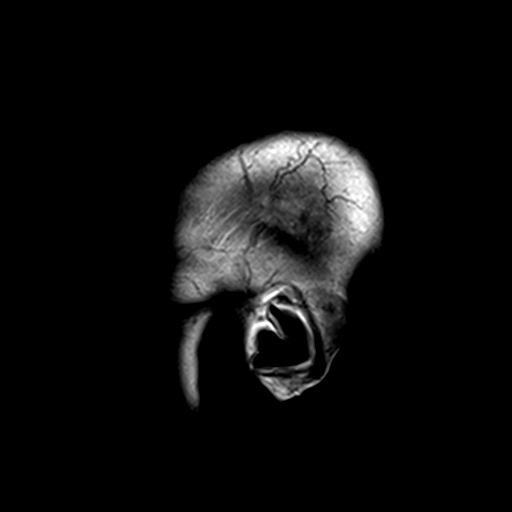

[Series 3: DWI · axial · 3.0mm · 1.80mm/px · z∈[-57,+90]mm · 6 of 100 slices shown (1 of 4)]
[im 1/100]
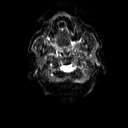
[im 20/100]
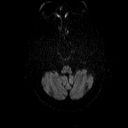
[im 40/100]
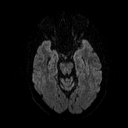
[im 60/100]
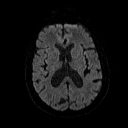
[im 80/100]
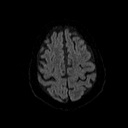
[im 100/100]
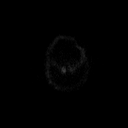

[Series 4: DWI · axial · 3.0mm · 1.80mm/px · z∈[-57,+90]mm · 4 of 50 slices shown (2 of 4)]
[im 1/50]
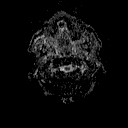
[im 17/50]
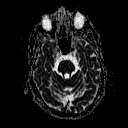
[im 33/50]
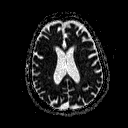
[im 50/50]
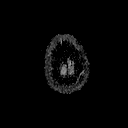

[Series 5: DWI · coronal · 5.0mm · 1.80mm/px · 5 of 72 slices shown (3 of 4)]
[im 1/72]
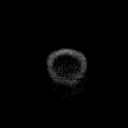
[im 18/72]
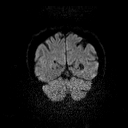
[im 36/72]
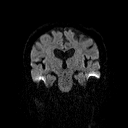
[im 54/72]
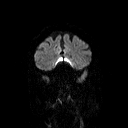
[im 72/72]
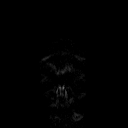

[Series 6: DWI · coronal · 5.0mm · 1.80mm/px · 3 of 36 slices shown (4 of 4)]
[im 1/36]
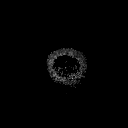
[im 18/36]
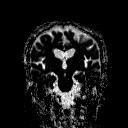
[im 36/36]
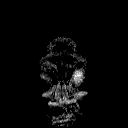

[Series 7: T2 · axial · 5.0mm · 0.51mm/px · z∈[-42,+99]mm · 2 of 22 slices shown]
[im 1/22]
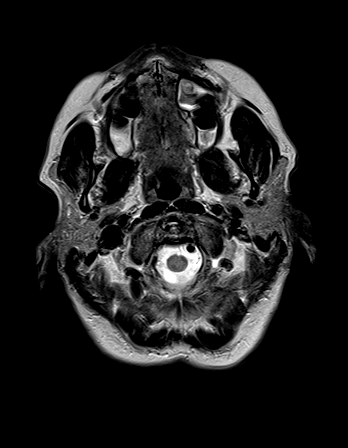
[im 22/22]
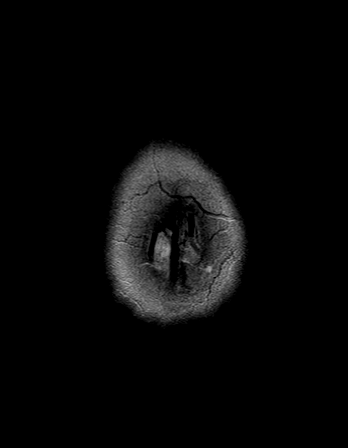

[Series 8: FLAIR · axial · 3.0mm · 0.45mm/px · z∈[-41,+103]mm · 2 of 32 slices shown]
[im 1/32]
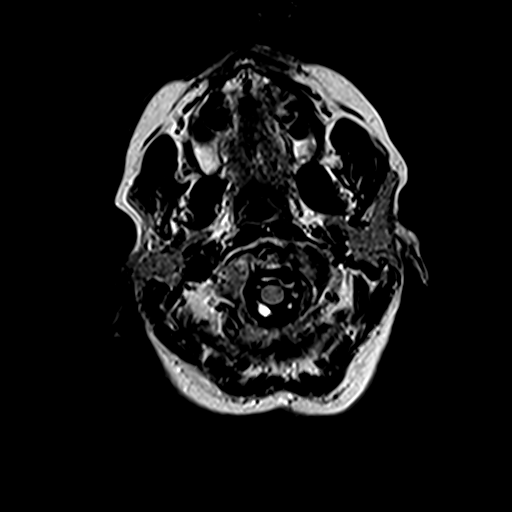
[im 32/32]
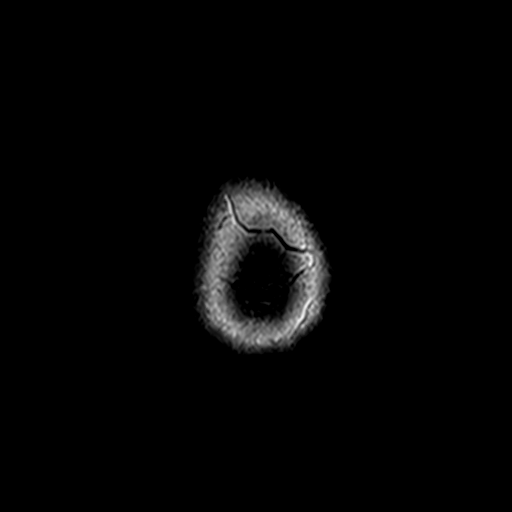

[Series 10: swi_images · axial · 4.0mm · 0.90mm/px · z∈[-42,+98]mm · 3 of 36 slices shown]
[im 1/36]
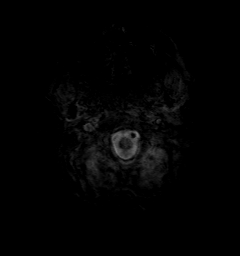
[im 18/36]
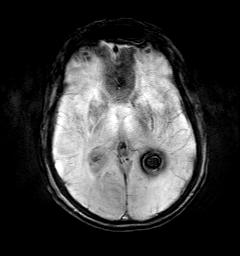
[im 36/36]
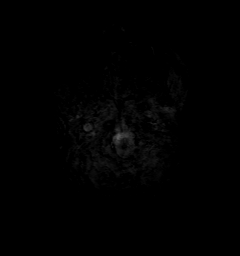

[Series 11: t1_mpr_tra · axial · 1.0mm · 0.75mm/px · z∈[-52,+91]mm · 8 of 144 slices shown]
[im 1/144]
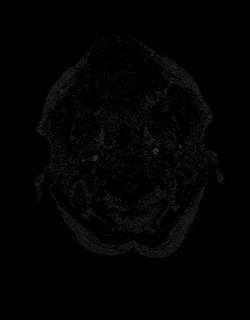
[im 16/144]
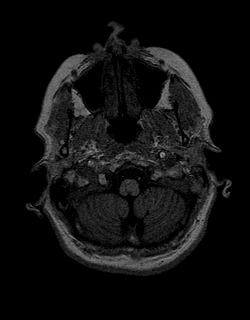
[im 48/144]
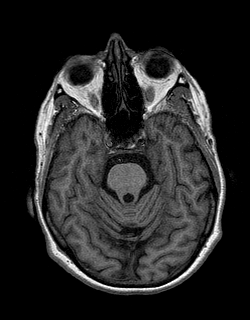
[im 64/144]
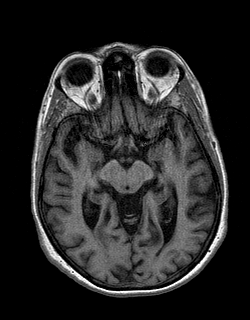
[im 80/144]
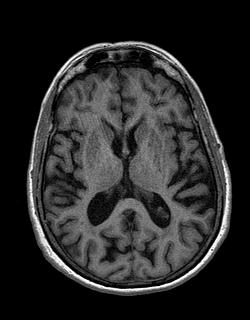
[im 96/144]
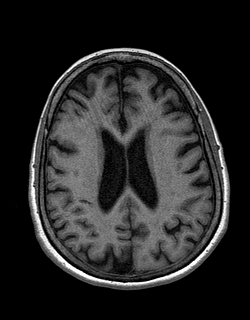
[im 128/144]
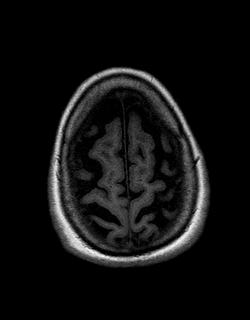
[im 144/144]
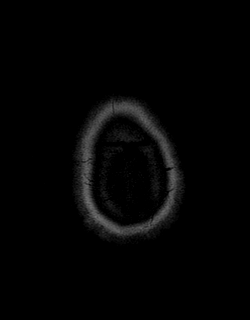

[Series 12: T1 · coronal · 3.0mm · 0.35mm/px · 1 of 11 slices shown (2 of 3)]
[im 1/11]
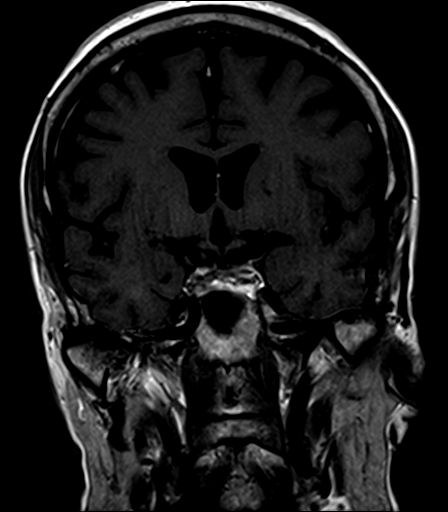

[Series 13: bSSFP · axial · 1.0mm · 0.28mm/px · z∈[-32,+3]mm · 3 of 36 slices shown]
[im 1/36]
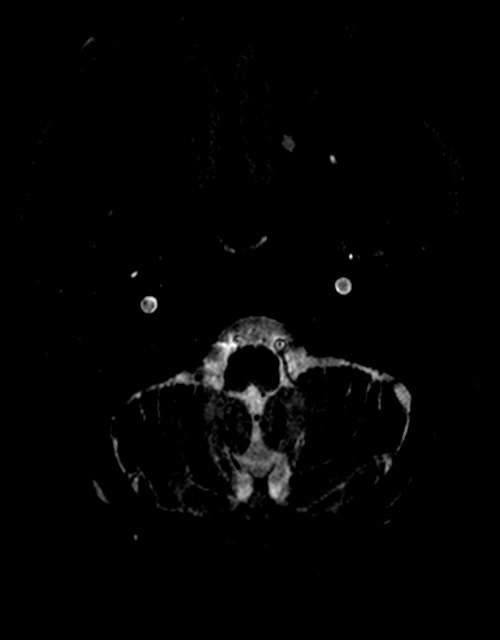
[im 18/36]
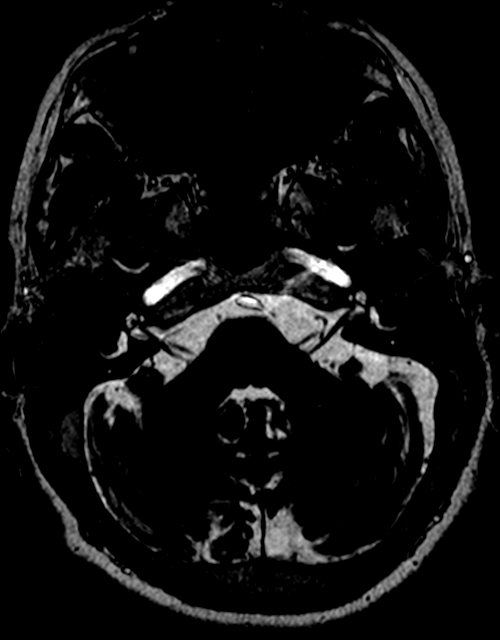
[im 36/36]
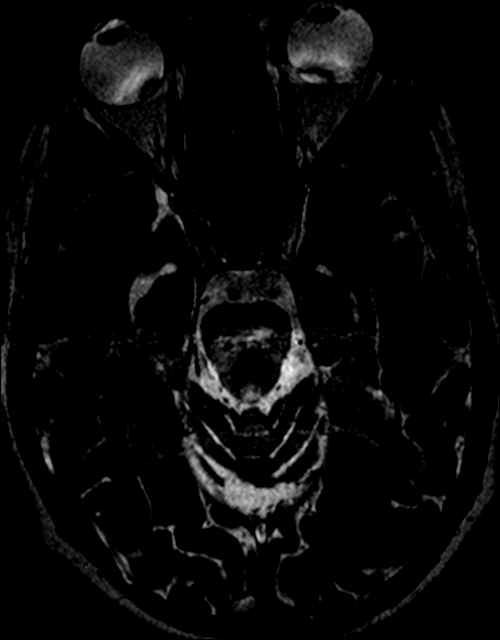

[Series 14: T1 · axial · 3.0mm · 0.35mm/px · 1 of 11 slices shown (3 of 3)]
[im 1/11]
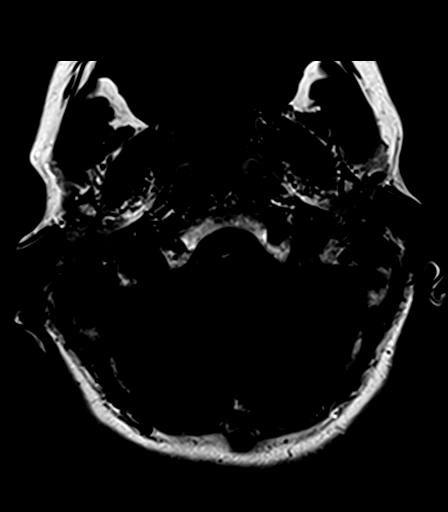

[Series 15: T1 post-contrast · coronal · 3.0mm · 0.35mm/px · 1 of 11 slices shown (1 of 2)]
[im 1/11]
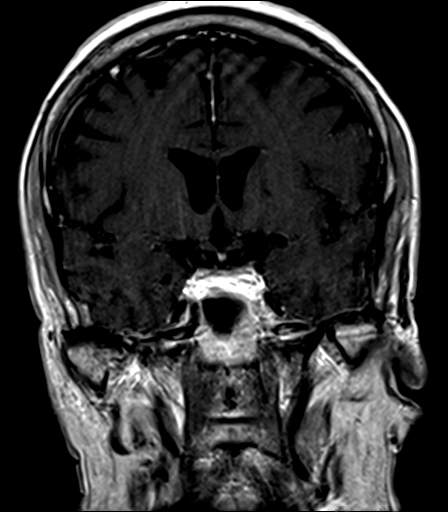

[Series 16: T1 post-contrast · axial · 3.0mm · 0.35mm/px · 1 of 11 slices shown (2 of 2)]
[im 1/11]
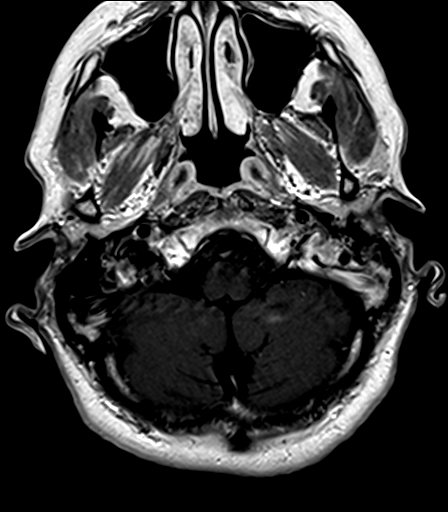

[Series 17: post_t1_mpr_tra · axial · 2.0mm · 0.45mm/px · z∈[-51,+55]mm · 4 of 72 slices shown]
[im 1/72]
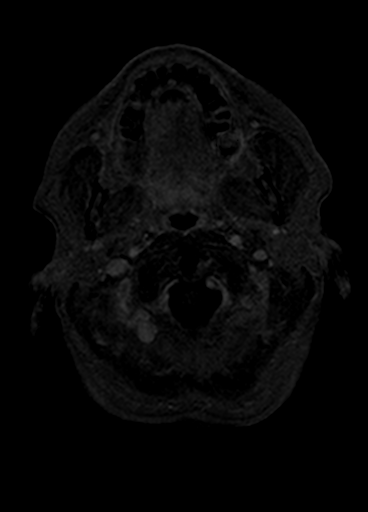
[im 18/72]
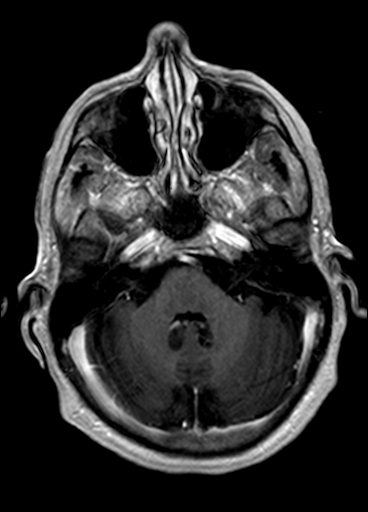
[im 36/72]
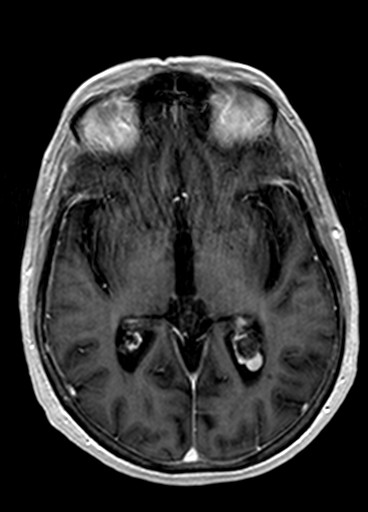
[im 54/72]
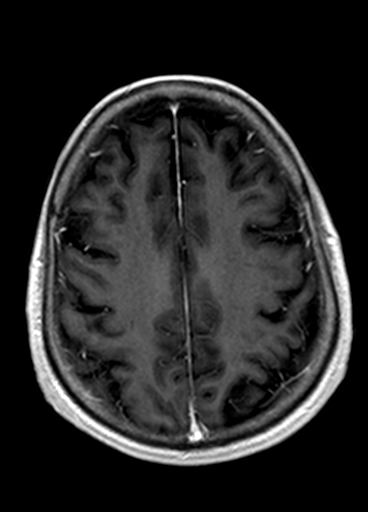

[45 of 48 positions shown; findings below may reference images not displayed]

FINDINGS: Brain: IAC protocol performed. Seventh eighth cranial nerves normal.
Negative for vestibular schwannoma. Brainstem and cerebellum normal.
Basilar cisterns normal. Small mastoid effusion on the left. Right
mastoid sinus clear. No enhancing lesion in the temporal bone.

Generalized atrophy without hydrocephalus. Scattered small white
matter hyperintensities consistent with chronic microvascular
ischemia. Negative for acute infarct, hemorrhage, mass.

Vascular: Normal arterial flow voids. Normal dural venous
enhancement.

Skull and upper cervical spine: No focal skeletal lesion.

Sinuses/Orbits: Mild mucosal edema paranasal sinuses. Negative orbit

Other: None
IMPRESSION: Atrophy and mild chronic microvascular ischemic change in the white
matter

No acute abnormality. No cause for tinnitus or hearing loss or
ataxia.

## 2021-11-23 ENCOUNTER — Other Ambulatory Visit: Payer: Self-pay | Admitting: Physician Assistant

## 2021-11-23 ENCOUNTER — Encounter: Payer: Self-pay | Admitting: Cardiology

## 2021-11-23 ENCOUNTER — Telehealth: Payer: Self-pay | Admitting: Physician Assistant

## 2021-11-23 MED ORDER — DIAZEPAM 10 MG PO TABS: 5.0000 mg | ORAL_TABLET | Freq: Two times a day (BID) | ORAL | 0 refills | Status: DC | PRN

## 2021-11-23 NOTE — Telephone Encounter (Signed)
Prescription for Valium was sent in.  Please let her know that.  Thank you.

## 2021-11-23 NOTE — Telephone Encounter (Signed)
Have her increase the BuSpar 15 mg from 1 p.o. daily to 1 p.o. twice daily. I can send in a prescription for either Valium or Ativan.  She will have to bring the remainder of her Xanax prescription to our office before I can send that prescription in.  Then I will need you to cancel any refills on the Xanax. Have her make an appointment with me in approximately 6 weeks. Thank you.

## 2021-11-23 NOTE — Telephone Encounter (Signed)
Pt informed.I cancelled the xanax and she will come in today to bring rx

## 2021-11-23 NOTE — Telephone Encounter (Signed)
Called to get more info.Pt stated she has panic attacks daily.She takes xanax twice a day and it does nothing to help.She wants to know if she can try another med for her anxiety

## 2021-11-23 NOTE — Telephone Encounter (Signed)
Farhana called because she is reporting that she is having severe panic attacks.  She had to call out of work today.  The Xanax is not working at all.  Is there something else that can be prescribed?  [facility] to [facility] [address].

## 2021-11-24 NOTE — Telephone Encounter (Signed)
Pt informed

## 2021-11-26 DIAGNOSIS — I10 Essential (primary) hypertension: Secondary | ICD-10-CM | POA: Diagnosis not present

## 2021-11-26 DIAGNOSIS — I480 Paroxysmal atrial fibrillation: Secondary | ICD-10-CM

## 2021-11-27 ENCOUNTER — Other Ambulatory Visit: Payer: Self-pay | Admitting: Physician Assistant

## 2021-11-29 MED ORDER — DILTIAZEM HCL ER 180 MG PO CP24: 180.0000 mg | ORAL_CAPSULE | Freq: Every day | ORAL | 3 refills | Status: DC

## 2021-12-06 ENCOUNTER — Other Ambulatory Visit: Payer: Self-pay | Admitting: Physician Assistant

## 2021-12-07 ENCOUNTER — Other Ambulatory Visit: Payer: Self-pay | Admitting: Physician Assistant

## 2021-12-07 NOTE — Telephone Encounter (Signed)
Filled 12/15 for 15 days

## 2021-12-07 NOTE — Telephone Encounter (Signed)
Never mind, I see that you already cancelled Xanax Rfs.

## 2021-12-07 NOTE — Telephone Encounter (Signed)
Please cancel the Rx for Xanax

## 2021-12-07 NOTE — Telephone Encounter (Signed)
Patient called in for refill on Diazepam 10mg . She has a few pills left and would like prescription called in ASAP as she "can't function without it.". Ph: [facility] [place]

## 2021-12-13 ENCOUNTER — Ambulatory Visit (INDEPENDENT_AMBULATORY_CARE_PROVIDER_SITE_OTHER): Payer: PPO | Admitting: Internal Medicine

## 2021-12-13 VITALS — BP 130/72 | HR 100 | Temp 97.7°F | Resp 16 | Ht 64.0 in | Wt 172.8 lb

## 2021-12-13 DIAGNOSIS — M545 Low back pain, unspecified: Secondary | ICD-10-CM

## 2021-12-13 DIAGNOSIS — G8929 Other chronic pain: Secondary | ICD-10-CM | POA: Diagnosis not present

## 2021-12-13 NOTE — Progress Notes (Signed)
Acute office Visit     This visit occurred during the SARS-CoV-2 public health emergency.  Safety protocols were in place, including screening questions prior to the visit, additional usage of staff PPE, and extensive cleaning of exam room while observing appropriate contact time as indicated for disinfecting solutions.    CC/Reason for Visit: Low back pain  HPI: Margaret Mckenzie is a 71 y.o. female who is coming in today for the above mentioned reasons.  She has been having low back pain now for a few months.  No urinary symptoms, no radiation down her legs, no saddle anesthesia, no bowel/bladder incontinence.  No fever.  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  Past Medical/Surgical History: Past Medical History:  Diagnosis Date   Anxiety    Bipolar 1 disorder ([place])    Chicken pox    Depression    Hypertension    high blood pressure readings    Kidney disease    kidney stones and saw urologist, Dr. Edward Qualia for lithotripsy remotely    PAF (paroxysmal atrial fibrillation) [facility])     Past Surgical History:  Procedure Laterality Date   BUNIONECTOMY  2007   [place]    Social History:  reports that she quit smoking about 4 years ago. Her smoking use included cigarettes. She smoked an average of .5 packs per day. She has never used smokeless tobacco. She reports current alcohol use of about 7.0 standard drinks per week. She reports that she does not use drugs.  Allergies: Allergies  Allergen Reactions   Trazodone And Nefazodone Other (See Comments)    Trazodone causes lethargy the morning after taking it   Soap Rash    Patients states soap at work drys out hands and has to use special cream to sooth skin.    Family History:  Family History  Problem Relation Age of Onset   Osteoporosis Mother    Stroke Mother        TIA, stoke   Mental retardation  Mother    Hypertension Father    Heart disease Father 56   Stroke Father 49   Mental illness Sister    Breast cancer Neg Hx      Current Outpatient Medications:    ALPRAZolam (XANAX) 1 MG tablet, Take 1 tablet (1 mg total) by mouth 2 (two) times daily as needed for anxiety. (Patient not taking: Reported on 12/13/2021), Disp: 60 tablet, Rfl: 5   Ascorbic Acid (VITAMIN C) 1000 MG tablet, Take 1,000 mg by mouth at bedtime., Disp: , Rfl:    busPIRone (BUSPAR) 15 MG tablet, TAKE 1 TABLET BY MOUTH EVERY DAY, Disp: 90 tablet, Rfl: 0   Calcium Carbonate (CALCIUM 600 PO), Take 1,200 mg by mouth at bedtime., Disp: , Rfl:    Cholecalciferol (VITAMIN D-3) 25 MCG (1000 UT) CAPS, Take 4,000 Units by mouth at bedtime., Disp: , Rfl:    cyanocobalamin (,VITAMIN B-12,) 1000 MCG/ML injection, INJECT 1 ML (1,000 MCG TOTAL) INTO THE MUSCLE EVERY 30 DAYS. (Patient taking differently: Inject 1,000 mcg into the muscle every 30 (thirty) days.), Disp: 3 mL, Rfl: 3   diazepam (VALIUM) 10 MG tablet, Take 0.5-1 tablets (5-10 mg total) by mouth every 12 (twelve) hours as needed for anxiety. (Patient not taking: Reported on 12/13/2021), Disp: 60 tablet, Rfl: 2   diltiazem (CARDIZEM) 30 MG tablet, Take  1 tablet every 4 hours AS NEEDED for AFIB heart rate >100 (Patient not taking: Reported on 11/11/2021), Disp: 45 tablet, Rfl: 1   diltiazem (DILACOR XR) 180 MG 24 hr capsule, Take 1 capsule (180 mg total) by mouth daily., Disp: 90 capsule, Rfl: 3   ELIQUIS 5 MG TABS tablet, TAKE 1 TABLET BY MOUTH TWICE A DAY, Disp: 180 tablet, Rfl: 3   lamoTRIgine (LAMICTAL) 150 MG tablet, TAKE 2 TABLETS BY MOUTH EVERY DAY AT BEDTIME (Patient taking differently: Take 300 mg by mouth at bedtime.), Disp: 180 tablet, Rfl: 2   lisinopril (ZESTRIL) 20 MG tablet, Take 1 tablet (20 mg total) by mouth daily., Disp: 90 tablet, Rfl: 1   Magnesium 400 MG CAPS, Take 400 mg by mouth at bedtime., Disp: , Rfl:    risperiDONE (RISPERDAL) 3 MG tablet, Take 1  tablet (3 mg total) by mouth at bedtime., Disp: 90 tablet, Rfl: 2   traZODone (DESYREL) 50 MG tablet, TAKE 1 TO 2 TABLETS BY MOUTH AT BEDTIME AS NEEDED FOR SLEEP, Disp: 180 tablet, Rfl: 0   zolpidem (AMBIEN) 5 MG tablet, Take 0.5-1 tablets (2.5-5 mg total) by mouth at bedtime as needed for sleep., Disp: 30 tablet, Rfl: 1  Review of Systems:  Constitutional: Denies fever, chills, diaphoresis, appetite change and fatigue.  HEENT: Denies photophobia, eye pain, redness, hearing loss, ear pain, congestion, sore throat, rhinorrhea, sneezing, mouth sores, trouble swallowing, neck pain, neck stiffness and tinnitus.   Respiratory: Denies SOB, DOE, cough, chest tightness,  and wheezing.   Cardiovascular: Denies chest pain, palpitations and leg swelling.  Gastrointestinal: Denies nausea, vomiting, abdominal pain, diarrhea, constipation, blood in stool and abdominal distention.  Genitourinary: Denies dysuria, urgency, frequency, hematuria, flank pain and difficulty urinating.  Endocrine: Denies: hot or cold intolerance, sweats, changes in hair or nails, polyuria, polydipsia. Musculoskeletal: Denies myalgias,  joint swelling, arthralgias and gait problem.  Skin: Denies pallor, rash and wound.  Neurological: Denies dizziness, seizures, syncope, weakness, light-headedness, numbness and headaches.  Hematological: Denies adenopathy. Easy bruising, personal or family bleeding history  Psychiatric/Behavioral: Denies suicidal ideation, mood changes, confusion, nervousness, sleep disturbance and agitation    Physical Exam: Vitals:   12/13/21 1330  BP: 130/72  Pulse: 100  Resp: 16  Temp: 97.7 F (36.5 C)  SpO2: 99%  Weight: 172 lb 12.8 oz (78.4 kg)  Height: 5\' 4"  (1.626 m)    Body mass index is 29.66 kg/m.   Constitutional: NAD, calm, comfortable Eyes: PERRL, lids and conjunctivae normal, wears corrective lenses ENMT: Mucous membranes are moist.  Neurologic: Grossly intact and  nonfocal Psychiatric: Normal judgment and insight. Alert and oriented x 3. Normal mood.    Impression and Plan:  Chronic bilateral low back pain without sciatica -No red flag symptoms. -Will treat conservatively with ice/heat therapy, as needed NSAIDs, local massage therapy, back stretches.  If no improvement can consider physical therapy. -She knows to follow-up with me if not better.  Time spent: 21 minutes reviewing chart, interviewing and examining patient and formulating plan of care.     [name], MD [facility]

## 2021-12-28 ENCOUNTER — Encounter: Payer: Self-pay | Admitting: Cardiology

## 2022-01-05 ENCOUNTER — Other Ambulatory Visit: Payer: Self-pay

## 2022-01-05 ENCOUNTER — Ambulatory Visit (INDEPENDENT_AMBULATORY_CARE_PROVIDER_SITE_OTHER): Payer: PPO | Admitting: Psychologist

## 2022-01-05 DIAGNOSIS — F411 Generalized anxiety disorder: Secondary | ICD-10-CM | POA: Diagnosis not present

## 2022-01-05 NOTE — Plan of Care (Signed)

## 2022-01-05 NOTE — Progress Notes (Signed)
[facility] Counselor Initial Adult Exam  Name: Margaret Mckenzie Date: 01/05/2022 MRN: [id number] DOB: Jun 26, 1951 PCP: Isaac Bliss, Rayford Halsted, MD  Time spent: 1:00 pm to 1:25 pm; total time: 25 minutes  This session was held via in person. The patient consented to in-person therapy and was in the clinician's office. Limits of confidentiality were discussed with the patient.   Guardian/Payee:  NA    Paperwork requested: No   Reason for Visit /Presenting Problem: Anxiety  Mental Status Exam: Appearance:   Well Groomed     Behavior:  Appropriate  Motor:  Normal  Speech/Language:   Clear and Coherent  Affect:  Appropriate  Mood:  normal  Thought process:  normal  Thought content:    WNL  Sensory/Perceptual disturbances:    WNL  Orientation:  oriented to person, place, and time/date  Attention:  Good  Concentration:  Good  Memory:  WNL  Fund of knowledge:   Good  Insight:    Fair  Judgment:   Fair  Impulse Control:  Good    Reported Symptoms:  The patient endorsed experiencing the following: heart palpitations, shortness of breath, feeling overwhelmed, feeling restless, feeling on edge, difficulty controlling worries, and tight muscles. She denied suicidal and homicidal ideation.   Risk Assessment: Danger to Self:  No Self-injurious Behavior: No Danger to Others: No Duty to Warn:no Physical Aggression / Violence:No  Access to Firearms a concern: No  Gang Involvement:No  Patient / guardian was educated about steps to take if suicide or homicide risk level increases between visits: n/a While future psychiatric events cannot be accurately predicted, the patient does not currently require acute inpatient psychiatric care and does not currently meet [facility] involuntary commitment criteria.  Substance Abuse History: Current substance abuse:  Patient described herself as vaping.      Past Psychiatric History:   Previous psychological history is  significant for anxiety Outpatient Providers:Matt Rylie [place] History of Psych Hospitalization: No  Psychological Testing:  NA    Abuse History:  Victim of: No.,  NA    Report needed: No. Victim of Neglect:No. Perpetrator of  NA   Witness / Exposure to Domestic Violence: No   Protective Services Involvement: No  Witness to [employer] Violence:  No   Family History:  Family History  Problem Relation Age of Onset   Osteoporosis Mother    Stroke Mother        TIA, stoke   Mental retardation Mother    Hypertension Father    Heart disease Father 6   Stroke Father 27   Mental illness Sister    Breast cancer Neg Hx     Living situation: the patient lives alone  Sexual Orientation: Straight  Relationship Status: single  Name of spouse / other:NA If a parent, number of children / ages:NA  Support Systems: friends  Financial Stress:  Yes   Income/Employment/Disability: Employment. 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 History of legal issue / charges:  NA  Medical History/Surgical History: reviewed Past Medical History:  Diagnosis Date  Anxiety    Bipolar 1 disorder ([facility])    Chicken pox    Depression    Hypertension    high blood pressure readings    Kidney disease    kidney stones and saw urologist, Dr. Edward Qualia for lithotripsy remotely    PAF (paroxysmal atrial fibrillation) [facility])     Past Surgical History:  Procedure Laterality Date   BUNIONECTOMY  2007   TONSILLECTOMY AND  ADENOIDECTOMY  1975    Medications: Current Outpatient Medications  Medication Sig Dispense Refill   ALPRAZolam (XANAX) 1 MG tablet Take 1 tablet (1 mg total) by mouth 2 (two) times daily as needed for anxiety. (Patient not taking: Reported on 12/13/2021) 60 tablet 5   Ascorbic Acid (VITAMIN C) 1000 MG tablet Take 1,000 mg by mouth at bedtime.     busPIRone (BUSPAR) 15 MG tablet TAKE 1 TABLET BY MOUTH EVERY DAY 90 tablet 0   Calcium Carbonate (CALCIUM 600 PO) Take 1,200 mg by mouth at bedtime.     Cholecalciferol (VITAMIN D-3) 25 MCG (1000 UT) CAPS Take 4,000 Units by mouth at bedtime.     cyanocobalamin (,VITAMIN B-12,) 1000 MCG/ML injection INJECT 1 ML (1,000 MCG TOTAL) INTO THE MUSCLE EVERY 30 DAYS. (Patient taking differently: Inject 1,000 mcg into the muscle every 30 (thirty) days.) 3 mL 3   diazepam (VALIUM) 10 MG tablet Take 0.5-1 tablets (5-10 mg total) by mouth every 12 (twelve) hours as needed for anxiety. (Patient not taking: Reported on 12/13/2021) 60 tablet 2   diltiazem (CARDIZEM) 30 MG tablet Take 1 tablet every 4 hours AS NEEDED for AFIB heart rate >100 (Patient not taking: Reported on 11/11/2021) 45 tablet 1   diltiazem (DILACOR XR) 180 MG 24 hr capsule Take 1 capsule (180 mg total) by mouth daily. 90 capsule 3   ELIQUIS 5 MG TABS tablet TAKE 1 TABLET BY MOUTH TWICE A DAY 180 tablet 3   lamoTRIgine (LAMICTAL) 150 MG tablet TAKE 2 TABLETS BY MOUTH EVERY DAY AT BEDTIME (Patient taking differently: Take 300 mg by mouth at bedtime.) 180 tablet 2   lisinopril (ZESTRIL) 20 MG tablet Take 1 tablet (20 mg total) by mouth daily. 90 tablet 1   Magnesium 400 MG CAPS Take 400 mg by mouth at bedtime.     risperiDONE (RISPERDAL) 3 MG tablet Take 1 tablet (3 mg total) by mouth at bedtime. 90 tablet 2   traZODone (DESYREL) 50 MG tablet TAKE 1 TO 2 TABLETS BY MOUTH AT BEDTIME AS NEEDED FOR SLEEP 180 tablet 0   zolpidem (AMBIEN) 5 MG tablet Take 0.5-1 tablets (2.5-5 mg total) by mouth at bedtime as  needed for sleep. 30 tablet 1   No current facility-administered medications for this visit.    Allergies  Allergen Reactions   Trazodone And Nefazodone Other (See Comments)    Trazodone causes lethargy the morning after taking it   Soap Rash    Patients states soap at work drys out hands and has to use special cream to sooth skin.    Diagnoses:  F41.1 generalized anxiety disorder  Plan of Care: The patient is a 71 year old Caucasian female who was referred to counseling due to experiencing anxiety based symptoms. The patient lives alone. The patient meets criteria for a diagnosis of F41.1 generalized anxiety disorder based off of the following: heart palpitations, shortness of breath, feeling overwhelmed, feeling restless, feeling on edge, difficulty controlling worries, and tight muscles. She denied suicidal and homicidal ideation.   The  patient stated that she wanted coping skills  This psychologist makes the recommendation that the patient participate in bi-weekly therapy to assist her in meeting her goals.    Conception Chancy, PsyD

## 2022-01-05 NOTE — Progress Notes (Signed)
                Nicolaos Mitrano, PsyD 

## 2022-01-16 ENCOUNTER — Other Ambulatory Visit: Payer: Self-pay | Admitting: Physician Assistant

## 2022-01-24 ENCOUNTER — Other Ambulatory Visit: Payer: Self-pay

## 2022-01-24 ENCOUNTER — Ambulatory Visit (INDEPENDENT_AMBULATORY_CARE_PROVIDER_SITE_OTHER): Payer: PPO | Admitting: Psychologist

## 2022-01-24 DIAGNOSIS — F411 Generalized anxiety disorder: Secondary | ICD-10-CM

## 2022-01-24 NOTE — Progress Notes (Signed)
[facility] Counselor/Therapist Progress Note  Patient ID: Margaret Mckenzie, MRN: [id number],    Date: 01/24/2022  Time Spent: 3:06 pm to 3:44 pm; total time: 38 minutes   This session was held via in person. The patient consented to in-person therapy and was in the clinician's office. Limits of confidentiality were discussed with the patient.   Treatment Type: Individual Therapy  Reported Symptoms: Anxiety  Mental Status Exam: Appearance:  Casual     Behavior: Appropriate  Motor: Normal  Speech/Language:  Clear and Coherent  Affect: Appropriate  Mood: normal  Thought process: normal  Thought content:   WNL  Sensory/Perceptual disturbances:   WNL  Orientation: oriented to person, place, and time/date  Attention: Good  Concentration: Good  Memory: WNL  Fund of knowledge:  Good  Insight:   Fair  Judgment:  Fair  Impulse Control: Good   Risk Assessment: Danger to Self:  No Self-injurious Behavior: No Danger to Others: No Duty to Warn:no Physical Aggression / Violence:No  Access to Firearms a concern: No  Gang Involvement:No   Subjective: Beginning the session, the patient described herself as doing well and experiencing less anxiety. After reviewing the treatment plan, she processed what has assisted her. From there, the patient identified the theme of comparison and how it is contributing to anxiety. Patient spent the session processing thoughts and emotions related to this concept. After discussing this concept, the patient described herself as better. She was agreeable to homework and following up. She denied suicidal and homicidal ideation.    Interventions:  Worked on developing a therapeutic relationship with the patient using active listening and reflective statements. Provided emotional support using empathy and validation. Reviewed the treatment plan with the patient. Reviewed events since the intake. Praised patient for doing better and explored  what has assisted the patient. Identified the theme of comparison to colleagues. Looked for evidence to support beliefs. Challenged some of the thoughts expressed by the patient. Used socratic questions to assist the patient gain insight into self. Provided psychoeducation about comparison and expectations of self. Explored the concept of letting go of expectations of self. Used self-disclosure to assist the patient. Processed the idea of writing a letter letting go of expectations. Normalized and validated emotions. Provided empathic statements. Assigned homework. Assessed for suicidal and homicidal ideation.   Homework: Patient will write letter letting go of expectations of self  Next Session: Review homework and emotional support  Diagnosis: F41.1 generalized anxiety disorder   Plan:   Client Abilities: Friendly and easy to develop rapport  Client Preferences: ACT and CBT  Client statement of Needs: Coping skills, challenge perceptions, and emotional support  Treatment Level:  Goals Reduce overall frequency, intensity, and duration of anxiety Stabilize anxiety level wile increasing ability to function Enhance ability to effectively cope with full variety of stressors Learn and implement coping skills that result in a reduction of anxiety   Objectives target date for all objectives is 01/05/2023 Verbalize an understanding of the cognitive, physiological, and behavioral components of anxiety Learning and implement calming skills to reduce overall anxiety Verbalize an understanding of the role that cognitive biases play in excessive irrational worry and persistent anxiety symptoms Identify, challenge, and replace based fearful talk Learn and implement problem solving strategies Identify and engage in pleasant activities Learning and implement personal and interpersonal skills to reduce anxiety and improve interpersonal relationships Learn to accept limitations in life and commit to  tolerating, rather than avoiding, unpleasant emotions while accomplishing meaningful  goals Identify major life conflicts from the past and present that form the basis for present anxiety Maintain involvement in work, family, and social activities Reestablish a consistent sleep-wake cycle Cooperate with a medical evaluation  Interventions Engage the patient in behavioral activation Use instruction, modeling, and role-playing to build the client's general social, communication, and/or conflict resolution skills Use Acceptance and Commitment Therapy to help client accept uncomfortable realities in order to accomplish value-consistent goals Reinforce the client's insight into the role of his/her past emotional pain and present anxiety  Support the client in following through with work, family, and social activities Teach and implement sleep hygiene practices  Refer the patient to a physician for a psychotropic medication consultation Monito the clint's psychotropic medication compliance Discuss how anxiety typically involves excessive worry, various bodily expressions of tension, and avoidance of what is threatening that interact to maintain the problem  Teach the patient relaxation skills Assign the patient homework Discuss examples demonstrating that unrealistic worry overestimates the probability of threats and underestimates patient's ability  Assist the patient in analyzing his or her worries Help patient understand that avoidance is reinforcing   The patient and clinician reviewed the treatment plan on 01/24/2022. The patient approved of the treatment plan.    Conception Chancy, PsyD

## 2022-01-24 NOTE — Progress Notes (Signed)
                Kaisyn Millea, PsyD 

## 2022-02-01 ENCOUNTER — Other Ambulatory Visit: Payer: Self-pay

## 2022-02-01 ENCOUNTER — Ambulatory Visit: Payer: PPO | Admitting: Physician Assistant

## 2022-02-01 ENCOUNTER — Encounter: Payer: Self-pay | Admitting: Physician Assistant

## 2022-02-01 DIAGNOSIS — F410 Panic disorder [episodic paroxysmal anxiety] without agoraphobia: Secondary | ICD-10-CM

## 2022-02-01 DIAGNOSIS — F319 Bipolar disorder, unspecified: Secondary | ICD-10-CM

## 2022-02-01 DIAGNOSIS — F411 Generalized anxiety disorder: Secondary | ICD-10-CM

## 2022-02-01 DIAGNOSIS — G4700 Insomnia, unspecified: Secondary | ICD-10-CM

## 2022-02-01 MED ORDER — LAMOTRIGINE 150 MG PO TABS: 300.0000 mg | ORAL_TABLET | Freq: Every evening | ORAL | 1 refills | Status: DC

## 2022-02-01 MED ORDER — RISPERIDONE 3 MG PO TABS: 3.0000 mg | ORAL_TABLET | Freq: Every day | ORAL | 1 refills | Status: DC

## 2022-02-01 MED ORDER — DIAZEPAM 10 MG PO TABS: 5.0000 mg | ORAL_TABLET | Freq: Two times a day (BID) | ORAL | 5 refills | Status: DC | PRN

## 2022-02-01 MED ORDER — BUSPIRONE HCL 15 MG PO TABS: 15.0000 mg | ORAL_TABLET | Freq: Every day | ORAL | 1 refills | Status: DC

## 2022-02-01 NOTE — Progress Notes (Signed)
[facility] Check ? ?Patient ID: Margaret Mckenzie,  ?MRN: [id number] ? ?PCP: Isaac Bliss, Rayford Halsted, MD ? ?Date of Evaluation: 02/01/2022 ?Time spent:20 minutes ? ?Chief Complaint:  ?Chief Complaint   ?Anxiety; Depression; Insomnia; Follow-up ?  ? ? ?HISTORY/CURRENT STATUS: ?HPI  For routine med check. ? ?Margaret Mckenzie is doing well.  Feels like her medications are working just fine.  She has accidentally been taking the BuSpar twice a day so is running out early.  She has not noticed any improvement in the anxiety with the increased dose.  She does take the diazepam, mostly on the days that she has to go into work.  She works at the [employer] and it can be very stressful and tiring at times.  Does not have panic attacks hardly ever now but she knows what her triggers are and tries to prevent them from happening. ? ?She sometimes has trouble going to sleep and staying asleep.  If she takes too high of a dose of trazodone then she she is dizzy the next morning.  She breaks the pills in half and if she wakes up in the middle of the night and cannot go back to sleep she will take 1/2 pill then, which does not make her dizzy. ? ?Patient denies loss of interest in usual activities and is able to enjoy things.  Denies decreased energy or motivation.  Appetite has not changed.  No extreme sadness, tearfulness, or feelings of hopelessness.  Denies any changes in concentration, making decisions or remembering things.  Denies suicidal or homicidal thoughts. ? ?Patient denies increased energy with decreased need for sleep, no increased talkativeness, no racing thoughts, no impulsivity or risky behaviors, no increased spending, no increased libido, no grandiosity, no increased irritability or anger, and no hallucinations. ? ?Denies dizziness, syncope, seizures, numbness, tingling, tremor, tics, unsteady gait, slurred speech, confusion. Denies muscle or joint pain, stiffness, or dystonia. ? ?Individual Medical  History/ Review of Systems: Changes? :No     ? ? ?Past medications for mental health diagnoses include: ?Depakote, Lamictal, Prozac, Lexapro, Xanax, Sonata caused amnesia, Risperdal, Trazodone ineffective at low doses but caused dizziness at higher doses. ? ?Allergies: Trazodone and nefazodone and Soap ? ?Current Medications:  ?Current Outpatient Medications:  ?  Ascorbic Acid (VITAMIN C) 1000 MG tablet, Take 1,000 mg by mouth at bedtime., Disp: , Rfl:  ?  Calcium Carbonate (CALCIUM 600 PO), Take 1,200 mg by mouth at bedtime., Disp: , Rfl:  ?  Cholecalciferol (VITAMIN D-3) 25 MCG (1000 UT) CAPS, Take 4,000 Units by mouth at bedtime., Disp: , Rfl:  ?  cyanocobalamin (,VITAMIN B-12,) 1000 MCG/ML injection, INJECT 1 ML (1,000 MCG TOTAL) INTO THE MUSCLE EVERY 30 DAYS. (Patient taking differently: Inject 1,000 mcg into the muscle every 30 (thirty) days.), Disp: 3 mL, Rfl: 3 ?  diltiazem (DILACOR XR) 180 MG 24 hr capsule, Take 1 capsule (180 mg total) by mouth daily., Disp: 90 capsule, Rfl: 3 ?  ELIQUIS 5 MG TABS tablet, TAKE 1 TABLET BY MOUTH TWICE A DAY, Disp: 180 tablet, Rfl: 3 ?  lisinopril (ZESTRIL) 20 MG tablet, Take 1 tablet (20 mg total) by mouth daily., Disp: 90 tablet, Rfl: 1 ?  Magnesium 400 MG CAPS, Take 400 mg by mouth at bedtime., Disp: , Rfl:  ?  traZODone (DESYREL) 50 MG tablet, TAKE 1 TO 2 TABLETS BY MOUTH AT BEDTIME AS NEEDED FOR SLEEP, Disp: 180 tablet, Rfl: 0 ?  busPIRone (BUSPAR) 15 MG tablet, Take  1 tablet (15 mg total) by mouth daily., Disp: 90 tablet, Rfl: 1 ?  diazepam (VALIUM) 10 MG tablet, Take 0.5-1 tablets (5-10 mg total) by mouth every 12 (twelve) hours as needed for anxiety., Disp: 60 tablet, Rfl: 5 ?  diltiazem (CARDIZEM) 30 MG tablet, Take 1 tablet every 4 hours AS NEEDED for AFIB heart rate >100 (Patient not taking: Reported on 02/01/2022), Disp: 45 tablet, Rfl: 1 ?  lamoTRIgine (LAMICTAL) 150 MG tablet, Take 2 tablets (300 mg total) by mouth at bedtime., Disp: 180 tablet, Rfl: 1 ?   risperiDONE (RISPERDAL) 3 MG tablet, Take 1 tablet (3 mg total) by mouth at bedtime., Disp: 90 tablet, Rfl: 1 ?Medication Side Effects: none ? ?Family Medical/ Social History: Changes?  No ? ?MENTAL HEALTH EXAM: ? ?There were no vitals taken for this visit.There is no height or weight on file to calculate BMI.  ?General Appearance: Casual, Neat, and Well Groomed  ?Eye Contact:  Good  ?Speech:  Clear and Coherent and Normal Rate  ?Volume:  Normal  ?Mood:  Euthymic  ?Affect:  Appropriate  ?Thought Process:  Goal Directed and Descriptions of Associations: Circumstantial  ?Orientation:  Full (Time, Place, and Person)  ?Thought Content: Logical   ?Suicidal Thoughts:  No  ?Homicidal Thoughts:  No  ?Memory:  WNL  ?Judgement:  Good  ?Insight:  Good  ?Psychomotor Activity:  Normal  ?Concentration:  Concentration: Good and Attention Span: Good  ?Recall:  Good  ?Fund of Knowledge: Good  ?Language: Good  ?Assets:  Desire for Improvement  ?ADL's:  Intact  ?Cognition: WNL  ?Prognosis:  Good  ? ? ?DIAGNOSES:  ?  ICD-10-CM   ?1. Bipolar I disorder ([place])  F31.9   ?  ?2. Generalized anxiety disorder  F41.1   ?  ?3. Panic disorder  F41.0   ?  ?4. Insomnia, unspecified type  G47.00   ?  ? ? ?Receiving Psychotherapy: Yes   Elias Else ? ? ?RECOMMENDATIONS:  ?PDMP was reviewed.  Last Valium filled 01/08/2022.  Last Ambien filled 08/03/2021. ?I provided 20 minutes of face to face time during this encounter, including time spent before and after the visit in records review, medical decision making, counseling pertinent to today's visit, and charting.  ?She's doing well so no changes in meds are needed. ? ?Continue BuSpar 15 mg, 1 p.o. daily. (Needing a RF earlier than expected d/t taking it twice a day by mistake.) ?Continue Valium 10 mg, 1/2-1 p.o. twice daily as needed anxiety or sleep. ?Continue Lamictal 150 mg, 2 p.o. nightly. ?Continue Risperdal 3 mg, 1 p.o. nightly. ?Continue trazodone 50 mg, 1/2-2 p.o. nightly as needed sleep. ?Labs  will be due at the next visit if she has not had them drawn by the end. ?Continue therapy with Elias Else.  ?Return in 6 months. ? ?Donnal Moat, PA-C  ?

## 2022-02-14 ENCOUNTER — Telehealth: Payer: Self-pay | Admitting: Pharmacist

## 2022-02-14 NOTE — Chronic Care Management (AMB) (Signed)
? ? ?Chronic Care Management ?Pharmacy Assistant  ? ?Name: Margaret Mckenzie  MRN: [id number] DOB: 1951-07-06 ? ?Reason for Encounter: Disease State / Hypertension Assessment call ?  ?Conditions to be addressed/monitored: ?HTN ? ?Recent office visits:  ?12/13/2021 [name] MD (Internal Medicine) - Patient was seen for chronic bilateral low back pain without sciatica. No medication changes. No follow up noted.  ? ?Recent consult visits:  ?02/01/2022 Donnal Moat PA-C (psychiatry) - Patient was seen for Bipolar I disorder and additional issues. Decreased Lamotrigine to 300 mg nightly. Discontinued Alprazolam and Zolpidem. Follow up in 6 months.  ? ?Hospital visits:  ?None ? ?Medications: ?Outpatient Encounter Medications as of 02/14/2022  ?Medication Sig  ? Ascorbic Acid (VITAMIN C) 1000 MG tablet Take 1,000 mg by mouth at bedtime.  ? busPIRone (BUSPAR) 15 MG tablet Take 1 tablet (15 mg total) by mouth daily.  ? Calcium Carbonate (CALCIUM 600 PO) Take 1,200 mg by mouth at bedtime.  ? Cholecalciferol (VITAMIN D-3) 25 MCG (1000 UT) CAPS Take 4,000 Units by mouth at bedtime.  ? cyanocobalamin (,VITAMIN B-12,) 1000 MCG/ML injection INJECT 1 ML (1,000 MCG TOTAL) INTO THE MUSCLE EVERY 30 DAYS. (Patient taking differently: Inject 1,000 mcg into the muscle every 30 (thirty) days.)  ? diazepam (VALIUM) 10 MG tablet Take 0.5-1 tablets (5-10 mg total) by mouth every 12 (twelve) hours as needed for anxiety.  ? diltiazem (CARDIZEM) 30 MG tablet Take 1 tablet every 4 hours AS NEEDED for AFIB heart rate >100 (Patient not taking: Reported on 02/01/2022)  ? diltiazem (DILACOR XR) 180 MG 24 hr capsule Take 1 capsule (180 mg total) by mouth daily.  ? ELIQUIS 5 MG TABS tablet TAKE 1 TABLET BY MOUTH TWICE A DAY  ? lamoTRIgine (LAMICTAL) 150 MG tablet Take 2 tablets (300 mg total) by mouth at bedtime.  ? lisinopril (ZESTRIL) 20 MG tablet Take 1 tablet (20 mg total) by mouth daily.  ? Magnesium 400 MG CAPS Take 400 mg by  mouth at bedtime.  ? risperiDONE (RISPERDAL) 3 MG tablet Take 1 tablet (3 mg total) by mouth at bedtime.  ? traZODone (DESYREL) 50 MG tablet TAKE 1 TO 2 TABLETS BY MOUTH AT BEDTIME AS NEEDED FOR SLEEP  ? ?No facility-administered encounter medications on file as of 02/14/2022.  ?Fill History: ?CYANOCOBALAMIN 1,000 MCG/ML VL 01/23/2022 84  ? ?DILT XR 180 MG CAPSULE 12/02/2021 90  ? ?DIAZEPAM 10 MG TABLET 02/10/2022 30  ? ?ELIQUIS 5 MG TABLET 01/11/2022 90  ? ?LAMOTRIGINE 150 MG TABLET 02/01/2022 90  ? ?LISINOPRIL 20 MG TABLET 12/11/2021 90  ? ?TRAZODONE HYDROCHLORIDE '50MG'$  TABLET 08/02/2021 90  ? 01/16/2022 90  ? ?Reviewed chart prior to disease state call. Spoke with patient regarding BP ? ?Recent Office Vitals: ?BP Readings from Last 3 Encounters:  ?12/13/21 130/72  ?11/11/21 140/70  ?10/26/21 130/90  ? ?Pulse Readings from Last 3 Encounters:  ?12/13/21 100  ?10/26/21 (!) 111  ?10/11/21 (!) 134  ?  ?Wt Readings from Last 3 Encounters:  ?12/13/21 172 lb 12.8 oz (78.4 kg)  ?10/26/21 173 lb 11.2 oz (78.8 kg)  ?10/11/21 170 lb 9.6 oz (77.4 kg)  ?  ? ?Kidney Function ?Lab Results  ?Component Value Date/Time  ? CREATININE 0.72 07/29/2021 01:17 PM  ? CREATININE 0.50 07/16/2021 11:02 AM  ? GFR 85.42 07/29/2021 01:17 PM  ? GFRNONAA >60 07/16/2021 11:02 AM  ? GFRAA 99 09/08/2019 09:16 AM  ? ? ?BMP Latest Ref Rng & Units 07/29/2021 07/16/2021 07/16/2021  ?  Glucose 70 - 99 mg/dL 94 113(H) 100(H)  ?BUN 6 - 23 mg/dL '11 11 11  '$ ?Creatinine 0.40 - 1.20 mg/dL 0.72 0.50 0.61  ?BUN/Creat Ratio 12 - 28 - - -  ?Sodium 135 - 145 mEq/L 137 129(L) 129(L)  ?Potassium 3.5 - 5.1 mEq/L 3.9 4.6 4.0  ?Chloride 96 - 112 mEq/L 101 96(L) 92(L)  ?CO2 19 - 32 mEq/L '29 25 28  '$ ?Calcium 8.4 - 10.5 mg/dL 10.2 9.7 10.1  ? ?Current antihypertensive regimen:  ?Diltiazem 180 mg daily ?Lisinopril 20 mg daily ? ?How often are you checking your Blood Pressure? Patient was checking daily until about 3 weeks ago when Dr. Percival Spanish  told her she no longer needed to check  as often.  ? ?Current home BP readings: Patients last reading was 130/80 3 weeks ago ? ?What recent interventions/DTPs have been made by any provider to improve Blood Pressure control since last CPP Visit: No recent interventions ? ?Any recent hospitalizations or ED visits since last visit with CPP? No recent hospital visits. ? ?What diet changes have been made to improve Blood Pressure Control?  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 ?Lunch - patient will have a meat, vegetable and sometimes a starch ?Dinner - patient doesn't eat dinner ? ?What exercise is being done to improve your Blood Pressure Control?  ?Patient walks 3 miles daily and other various exercises. ? ?Adherence Review: ?Is the patient currently on ACE/ARB medication? Yes ?Does the patient have >5 day gap between last estimated fill dates? No ? ?Care Gaps: ?AWV - previous message sent to Ramond Craver ?Last BP - 130/72 on 12/13/2021 ?Covid booster - overdue ?  ?Star Rating Drug: ?Lisinopril 20 mg - last filled 12/11/2021 90 DS at [facility] ? ?Gennie Alma CMA  ?Clinical Pharmacist Assistant ?[phone] ? ?

## 2022-02-18 ENCOUNTER — Other Ambulatory Visit: Payer: Self-pay | Admitting: Internal Medicine

## 2022-02-18 DIAGNOSIS — I10 Essential (primary) hypertension: Secondary | ICD-10-CM

## 2022-02-20 ENCOUNTER — Ambulatory Visit (INDEPENDENT_AMBULATORY_CARE_PROVIDER_SITE_OTHER): Payer: PPO | Admitting: Psychologist

## 2022-02-20 ENCOUNTER — Other Ambulatory Visit: Payer: Self-pay

## 2022-02-20 DIAGNOSIS — F411 Generalized anxiety disorder: Secondary | ICD-10-CM | POA: Diagnosis not present

## 2022-02-20 NOTE — Progress Notes (Signed)
[facility] Counselor/Therapist Progress Note ? ?Patient ID: Margaret Mckenzie, MRN: [id number],   ? ?Date: 02/20/2022 ? ?Time Spent: 3:04 pm to 3:44 pm; total time: 40 minutes ? ? This session was held via in person. The patient consented to in-person therapy and was in the clinician's office. Limits of confidentiality were discussed with the patient.  ? ?Treatment Type: Individual Therapy ? ?Reported Symptoms: Less Anxiety ? ?Mental Status Exam: ?Appearance:  Casual     ?Behavior: Appropriate  ?Motor: Normal  ?Speech/Language:  Clear and Coherent  ?Affect: Appropriate  ?Mood: normal  ?Thought process: normal  ?Thought content:   WNL  ?Sensory/Perceptual disturbances:   WNL  ?Orientation: oriented to person, place, and time/date  ?Attention: Good  ?Concentration: Good  ?Memory: WNL  ?Fund of knowledge:  Good  ?Insight:   Fair  ?Judgment:  Fair  ?Impulse Control: Good  ? ?Risk Assessment: ?Danger to Self:  No ?Self-injurious Behavior: No ?Danger to Others: No ?Duty to Warn:no ?Physical Aggression / Violence:No  ?Access to Firearms a concern: No  ?Gang Involvement:No  ? ?Subjective: Beginning the session, the patient described herself as doing well indicating that coping strategies continue to work. She also voiced that exposure at work has also facilitated growth in herself. She stated that she wanted to spend the session reflecting on the end of a relationship between her neighbors Mikki Santee and Legrand Como. She spent the session processing thoughts and emotions related to the end of their relationship. She also reflected on ways to maintain relationships with both individuals moving forward. She ended the session reflecting on next steps for therapy. She was agreeable to homework and following up. She denied suicidal and homicidal ideation.   ? ?Interventions:  Worked on developing a therapeutic relationship with the patient using active listening and reflective statements. Provided emotional support  using empathy and validation. Used summary statements. Praised patient for doing well and explored what has assisted the patient. Processed how exposure has been so beneficial to the patient. Explored the end of the relationship between her neighbors. Normalized and validated expressed thoughts. Processed the interpersonal dynamic between each of them. Explored ways that patient could still maintain a relationship with each of them independently. Used socratic questions to assist the patient gain insight into self. Assisted in problem solving. Processed the idea of writing a letter to the different emotions she is experiencing related to the end of this relationship. Explored next steps for counseling. Processed ways to ensure that patient does not experience a repeat of what occurred before related to the end of counseling. Normalized and validated emotions. Provided empathic statements. Assigned homework. Assessed for suicidal and homicidal ideation.  ? ?Homework: Patient will write to emotions she is experiencing related to Mikki Santee and Legrand Como ? ?Next Session: Review homework and emotional support ? ?Diagnosis: F41.1 generalized anxiety disorder  ? ?Plan:  ? ?Client Abilities: Friendly and easy to develop rapport ? ?Client Preferences: ACT and CBT ? ?Client statement of Needs: Coping skills, challenge perceptions, and emotional support ? ?Treatment Level: ? ?Goals ?Reduce overall frequency, intensity, and duration of anxiety ?Stabilize anxiety level wile increasing ability to function ?Enhance ability to effectively cope with full variety of stressors ?Learn and implement coping skills that result in a reduction of anxiety  ? ?Objectives target date for all objectives is 01/05/2023 ?Verbalize an understanding of the cognitive, physiological, and behavioral components of anxiety ?Learning and implement calming skills to reduce overall anxiety ?Verbalize an understanding of the role that cognitive  biases play in  excessive irrational worry and persistent anxiety symptoms ?Identify, challenge, and replace based fearful talk ?Learn and implement problem solving strategies ?Identify and engage in pleasant activities ?Learning and implement personal and interpersonal skills to reduce anxiety and improve interpersonal relationships ?Learn to accept limitations in life and commit to tolerating, rather than avoiding, unpleasant emotions while accomplishing meaningful goals ?Identify major life conflicts from the past and present that form the basis for present anxiety ?Maintain involvement in work, family, and social activities ?Reestablish a consistent sleep-wake cycle ?Cooperate with a medical evaluation  ?Interventions ?Engage the patient in behavioral activation ?Use instruction, modeling, and role-playing to build the client's general social, communication, and/or conflict resolution skills ?Use Acceptance and Commitment Therapy to help client accept uncomfortable realities in order to accomplish value-consistent goals ?Reinforce the client's insight into the role of his/her past emotional pain and present anxiety  ?Support the client in following through with work, family, and social activities ?Teach and implement sleep hygiene practices  ?Refer the patient to a physician for a psychotropic medication consultation ?Monito the clint's psychotropic medication compliance ?Discuss how anxiety typically involves excessive worry, various bodily expressions of tension, and avoidance of what is threatening that interact to maintain the problem  ?Teach the patient relaxation skills ?Assign the patient homework ?Discuss examples demonstrating that unrealistic worry overestimates the probability of threats and underestimates patient's ability  ?Assist the patient in analyzing his or her worries ?Help patient understand that avoidance is reinforcing  ? ?The patient and clinician reviewed the treatment plan on 01/24/2022. The patient  approved of the treatment plan.  ? ? ?Conception Chancy, PsyD ? ? ? ? ? ? ? ? ? ? ? ? ? ? ? ? ? ?Conception Chancy, PsyD ?

## 2022-04-04 ENCOUNTER — Ambulatory Visit: Payer: PPO | Admitting: Physician Assistant

## 2022-04-12 ENCOUNTER — Encounter: Payer: Self-pay | Admitting: Cardiology

## 2022-04-17 ENCOUNTER — Ambulatory Visit: Payer: PPO | Admitting: Psychologist

## 2022-04-18 ENCOUNTER — Other Ambulatory Visit: Payer: Self-pay | Admitting: Internal Medicine

## 2022-04-18 ENCOUNTER — Ambulatory Visit (INDEPENDENT_AMBULATORY_CARE_PROVIDER_SITE_OTHER): Payer: PPO | Admitting: Psychologist

## 2022-04-18 DIAGNOSIS — F411 Generalized anxiety disorder: Secondary | ICD-10-CM | POA: Diagnosis not present

## 2022-04-18 NOTE — Progress Notes (Signed)
[facility] Counselor/Therapist Progress Note  Patient ID: Margaret Mckenzie, MRN: [id number],    Date: 04/18/2022  Time Spent: 02:06 pm to 2:22 pm; total time: 16 minutes   This session was held via in person. The patient consented to in-person therapy and was in the clinician's office. Limits of confidentiality were discussed with the patient.   Treatment Type: Individual Therapy  Reported Symptoms: No symptoms  Mental Status Exam: Appearance:  Casual     Behavior: Appropriate  Motor: Normal  Speech/Language:  Clear and Coherent  Affect: Appropriate  Mood: normal  Thought process: normal  Thought content:   WNL  Sensory/Perceptual disturbances:   WNL  Orientation: oriented to person, place, and time/date  Attention: Good  Concentration: Good  Memory: WNL  Fund of knowledge:  Good  Insight:   Fair  Judgment:  Fair  Impulse Control: Good   Risk Assessment: Danger to Self:  No Self-injurious Behavior: No Danger to Others: No Duty to Warn:no Physical Aggression / Violence:No  Access to Firearms a concern: No  Gang Involvement:No   Subjective: Beginning the session, the patient described herself as doing well stating that she was experiencing no symptoms. She reflected on potential barriers and how to overcome them. She reflected on her growth in counseling. She terminated therapy. She denied suicidal and homicidal ideation.    Interventions:  Worked on developing a therapeutic relationship with the patient using active listening and reflective statements. Provided emotional support using empathy and validation. Reflected on events since the last session. Praised the patient for doing well and explored what has assisted the patient. Normalized and validated expressed thoughts and emotions. Explored potential barriers. Processed how to overcome those barriers. Identified warning signs for therapy. Discussed how patient can schedule to get into therapy if  needed. Praised patient for the work that she has put into therapy. Provided empathic statements. Assessed for suicidal and homicidal ideation.   Homework: NA  Next Session: NA  Diagnosis: F41.1 generalized anxiety disorder   Plan:   Client Abilities: Friendly and easy to develop rapport  Client Preferences: ACT and CBT  Client statement of Needs: Coping skills, challenge perceptions, and emotional support  Treatment Level:  Goals Reduce overall frequency, intensity, and duration of anxiety Stabilize anxiety level wile increasing ability to function Enhance ability to effectively cope with full variety of stressors Learn and implement coping skills that result in a reduction of anxiety   Objectives target date for all objectives is 01/05/2023 Verbalize an understanding of the cognitive, physiological, and behavioral components of anxiety Learning and implement calming skills to reduce overall anxiety Verbalize an understanding of the role that cognitive biases play in excessive irrational worry and persistent anxiety symptoms Identify, challenge, and replace based fearful talk Learn and implement problem solving strategies Identify and engage in pleasant activities Learning and implement personal and interpersonal skills to reduce anxiety and improve interpersonal relationships Learn to accept limitations in life and commit to tolerating, rather than avoiding, unpleasant emotions while accomplishing meaningful goals Identify major life conflicts from the past and present that form the basis for present anxiety Maintain involvement in work, family, and social activities Reestablish a consistent sleep-wake cycle Cooperate with a medical evaluation  Interventions Engage the patient in behavioral activation Use instruction, modeling, and role-playing to build the client's general social, communication, and/or conflict resolution skills Use Acceptance and Commitment Therapy to help  client accept uncomfortable realities in order to accomplish value-consistent goals Reinforce the client's insight into  the role of his/her past emotional pain and present anxiety  Support the client in following through with work, family, and social activities Teach and implement sleep hygiene practices  Refer the patient to a physician for a psychotropic medication consultation Monito the clint's psychotropic medication compliance Discuss how anxiety typically involves excessive worry, various bodily expressions of tension, and avoidance of what is threatening that interact to maintain the problem  Teach the patient relaxation skills Assign the patient homework Discuss examples demonstrating that unrealistic worry overestimates the probability of threats and underestimates patient's ability  Assist the patient in analyzing his or her worries Help patient understand that avoidance is reinforcing   The patient and clinician reviewed the treatment plan on 01/24/2022. The patient approved of the treatment plan.    Conception Chancy, PsyD

## 2022-04-25 ENCOUNTER — Ambulatory Visit: Payer: PPO | Admitting: Internal Medicine

## 2022-04-26 ENCOUNTER — Ambulatory Visit (INDEPENDENT_AMBULATORY_CARE_PROVIDER_SITE_OTHER): Payer: PPO | Admitting: Internal Medicine

## 2022-04-26 ENCOUNTER — Encounter: Payer: Self-pay | Admitting: Internal Medicine

## 2022-04-26 VITALS — BP 132/78 | HR 84 | Temp 97.6°F | Ht 64.0 in | Wt 176.4 lb

## 2022-04-26 DIAGNOSIS — F411 Generalized anxiety disorder: Secondary | ICD-10-CM

## 2022-04-26 DIAGNOSIS — I10 Essential (primary) hypertension: Secondary | ICD-10-CM | POA: Diagnosis not present

## 2022-04-26 DIAGNOSIS — E871 Hypo-osmolality and hyponatremia: Secondary | ICD-10-CM | POA: Diagnosis not present

## 2022-04-26 DIAGNOSIS — I480 Paroxysmal atrial fibrillation: Secondary | ICD-10-CM | POA: Diagnosis not present

## 2022-04-26 DIAGNOSIS — E538 Deficiency of other specified B group vitamins: Secondary | ICD-10-CM

## 2022-04-26 DIAGNOSIS — F319 Bipolar disorder, unspecified: Secondary | ICD-10-CM | POA: Diagnosis not present

## 2022-04-26 LAB — BASIC METABOLIC PANEL
BUN: 9 mg/dL (ref 6–23)
CO2: 27 mEq/L (ref 19–32)
Calcium: 9.8 mg/dL (ref 8.4–10.5)
Chloride: 96 mEq/L (ref 96–112)
Creatinine, Ser: 0.67 mg/dL (ref 0.40–1.20)
GFR: 88.83 mL/min (ref >60.00)
Glucose, Bld: 76 mg/dL (ref 70–99)
Potassium: 3.9 mEq/L (ref 3.5–5.1)
Sodium: 134 mEq/L — ABNORMAL LOW (ref 135–145)

## 2022-04-26 LAB — VITAMIN B12: Vitamin B-12: 917 pg/mL — ABNORMAL HIGH (ref 211–911)

## 2022-04-26 NOTE — Progress Notes (Signed)
Established Patient Office Visit     CC/Reason for Visit: 36-monthfollow-up chronic medical conditions  HPI: Margaret Mckenzie a 71y.o. female who is coming in today for the above mentioned reasons. Past Medical History is significant for: Hypertension, atrial fibrillation, bipolar disorder.  She has also been hospitalized in the last year for hyponatremia.  She was taken off hydrochlorothiazide for this reason.  She has not been doing ambulatory blood pressure measurements.  She feels well and has no acute concerns or complaints.   Past Medical/Surgical History: Past Medical History:  Diagnosis Date   Anxiety    Bipolar 1 disorder ([place]    Chicken pox    Depression    Hypertension    high blood pressure readings    Kidney disease    kidney stones and saw urologist, Dr. DEdward Qualiafor lithotripsy remotely    PAF (paroxysmal atrial fibrillation) ([facility]     Past Surgical History:  Procedure Laterality Date   [place]   Social History:  reports that she quit smoking about 4 years ago. Her smoking use included cigarettes. She smoked an average of .5 packs per day. She has never used smokeless tobacco. She reports current alcohol use of about 7.0 standard drinks per week. She reports that she does not use drugs.  Allergies: Allergies  Allergen Reactions   Trazodone And Nefazodone Other (See Comments)    Trazodone causes lethargy the morning after taking it   Soap Rash    Patients states soap at work drys out hands and has to use special cream to sooth skin.    Family History:  Family History  Problem Relation Age of Onset   Osteoporosis Mother    Stroke Mother        TIA, stoke   Mental retardation Mother    Hypertension Father    Heart disease Father 814  Stroke Father 940  Mental illness Sister    Breast cancer Neg Hx      Current Outpatient Medications:    Ascorbic Acid (VITAMIN C) 1000 MG  tablet, Take 1,000 mg by mouth at bedtime., Disp: , Rfl:    busPIRone (BUSPAR) 15 MG tablet, Take 1 tablet (15 mg total) by mouth daily., Disp: 90 tablet, Rfl: 1   Calcium Carbonate (CALCIUM 600 PO), Take 1,200 mg by mouth at bedtime., Disp: , Rfl:    Cholecalciferol (VITAMIN D-3) 25 MCG (1000 UT) CAPS, Take 4,000 Units by mouth at bedtime., Disp: , Rfl:    cyanocobalamin (,VITAMIN B-12,) 1000 MCG/ML injection, INJECT 1 ML (1,000 MCG TOTAL) INTO THE MUSCLE EVERY 30 DAYS., Disp: 3 mL, Rfl: 1   diazepam (VALIUM) 10 MG tablet, Take 0.5-1 tablets (5-10 mg total) by mouth every 12 (twelve) hours as needed for anxiety., Disp: 60 tablet, Rfl: 5   diltiazem (CARDIZEM) 30 MG tablet, Take 1 tablet every 4 hours AS NEEDED for AFIB heart rate >100, Disp: 45 tablet, Rfl: 1   diltiazem (DILACOR XR) 180 MG 24 hr capsule, Take 1 capsule (180 mg total) by mouth daily., Disp: 90 capsule, Rfl: 3   ELIQUIS 5 MG TABS tablet, TAKE 1 TABLET BY MOUTH TWICE A DAY, Disp: 180 tablet, Rfl: 3   lamoTRIgine (LAMICTAL) 150 MG tablet, Take 2 tablets (300 mg total) by mouth at bedtime., Disp: 180 tablet, Rfl: 1   lisinopril (ZESTRIL) 20 MG tablet, TAKE 1 TABLET BY MOUTH EVERY DAY,  Disp: 90 tablet, Rfl: 1   Magnesium 400 MG CAPS, Take 400 mg by mouth at bedtime., Disp: , Rfl:    risperiDONE (RISPERDAL) 3 MG tablet, Take 1 tablet (3 mg total) by mouth at bedtime., Disp: 90 tablet, Rfl: 1   traZODone (DESYREL) 50 MG tablet, TAKE 1 TO 2 TABLETS BY MOUTH AT BEDTIME AS NEEDED FOR SLEEP, Disp: 180 tablet, Rfl: 0  Review of Systems:  Constitutional: Denies fever, chills, diaphoresis, appetite change and fatigue.  HEENT: Denies photophobia, eye pain, redness, hearing loss, ear pain, congestion, sore throat, rhinorrhea, sneezing, mouth sores, trouble swallowing, neck pain, neck stiffness and tinnitus.   Respiratory: Denies SOB, DOE, cough, chest tightness,  and wheezing.   Cardiovascular: Denies chest pain, palpitations and leg swelling.   Gastrointestinal: Denies nausea, vomiting, abdominal pain, diarrhea, constipation, blood in stool and abdominal distention.  Genitourinary: Denies dysuria, urgency, frequency, hematuria, flank pain and difficulty urinating.  Endocrine: Denies: hot or cold intolerance, sweats, changes in hair or nails, polyuria, polydipsia. Musculoskeletal: Denies myalgias, back pain, joint swelling, arthralgias and gait problem.  Skin: Denies pallor, rash and wound.  Neurological: Denies dizziness, seizures, syncope, weakness, light-headedness, numbness and headaches.  Hematological: Denies adenopathy. Easy bruising, personal or family bleeding history  Psychiatric/Behavioral: Denies suicidal ideation, mood changes, confusion, nervousness, sleep disturbance and agitation    Physical Exam: Vitals:   04/26/22 1328  BP: 132/78  Pulse: 84  Temp: 97.6 F (36.4 C)  TempSrc: Oral  SpO2: 96%  Weight: 176 lb 6.4 oz (80 kg)  Height: '5\' 4"'$  (1.626 m)    Body mass index is 30.28 kg/m.   Constitutional: NAD, calm, comfortable Eyes: PERRL, lids and conjunctivae normal, wears corrective lenses ENMT: Mucous membranes are moist.  Respiratory: clear to auscultation bilaterally, no wheezing, no crackles. Normal respiratory effort. No accessory muscle use.  Cardiovascular: Regular rate but irregular rhythm consistent with known history of atrial fibrillation, no murmurs / rubs / gallops. No extremity edema.  Psychiatric: Normal judgment and insight. Alert and oriented x 3. Normal mood.    Impression and Plan:  Hyponatremia  - Plan: Basic metabolic panel  Bipolar I disorder ([place]) -Mood is stable, followed by psychiatry.  Paroxysmal atrial fibrillation ([facility]) -Rate controlled on Cardizem, followed by cardiology.  Essential hypertension -A little above goal today.  She is currently on lisinopril 20 mg and diltiazem 180 mg.  Vitamin B12 deficiency  - Plan: Vitamin B12     Time spent:31 minutes  reviewing chart, interviewing and examining patient and formulating plan of care.      [name], MD [facility]

## 2022-05-08 DIAGNOSIS — I482 Chronic atrial fibrillation, unspecified: Secondary | ICD-10-CM | POA: Insufficient documentation

## 2022-05-08 NOTE — Progress Notes (Signed)
Cardiology Office Note   Date:  05/09/2022   ID:  Margaret Mckenzie, Margaret Mckenzie Apr 02, 1951, MRN [id number]  PCP:  Isaac Bliss, Rayford Halsted, MD  Cardiologist:   Minus Breeding, MD    Chief Complaint  Patient presents with   Atrial Fibrillation       History of Present Illness: Margaret Mckenzie is a 71 y.o. female who presents for evaluation of atrial fib.  She was noted to be in this in her PCP office in July of 2018.  She as been seen in the Afib Clinic.  She did have an echocardiogram and this demonstrated some LVH and was otherwise unremarkable.  She was in the ED in Dec 2019 for PAF.  This has progressed to chronic atrial fib  Since I last saw her she has done okay.  Her heart rate seems to be better controlled.  She wore the Zio patch and I added the Cardizem for better control.  It is in the 80s and 90s.  She does not feel palpitations. The patient denies any new symptoms such as chest discomfort, neck or arm discomfort. There has been no new shortness of breath, PND or orthopnea. There have been no reported palpitations, presyncope or syncope.   Of note hyponatremia is much improved.  She had this probably related to medications including HCTZ in the past.   Past Medical History:  Diagnosis Date   Anxiety    Bipolar 1 disorder ([place])    Chicken pox    Depression    Hypertension    high blood pressure readings    Kidney disease    kidney stones and saw urologist, Dr. Edward Qualia for lithotripsy remotely    PAF (paroxysmal atrial fibrillation) [facility])     Past Surgical History:  Procedure Laterality Date   BUNIONECTOMY  2007   TONSILLECTOMY AND ADENOIDECTOMY  1975     Current Outpatient Medications  Medication Sig Dispense Refill   Ascorbic Acid (VITAMIN C) 1000 MG tablet Take 1,000 mg by mouth at bedtime.     busPIRone (BUSPAR) 15 MG tablet Take 1 tablet (15 mg total) by mouth daily. 90 tablet 1   Calcium Carbonate (CALCIUM 600 PO) Take 1,200 mg by mouth at  bedtime.     Cholecalciferol (VITAMIN D-3) 25 MCG (1000 UT) CAPS Take 4,000 Units by mouth at bedtime.     cyanocobalamin (,VITAMIN B-12,) 1000 MCG/ML injection INJECT 1 ML (1,000 MCG TOTAL) INTO THE MUSCLE EVERY 30 DAYS. 3 mL 1   diazepam (VALIUM) 10 MG tablet Take 0.5-1 tablets (5-10 mg total) by mouth every 12 (twelve) hours as needed for anxiety. 60 tablet 5   diltiazem (CARDIZEM) 30 MG tablet Take 1 tablet every 4 hours AS NEEDED for AFIB heart rate >100 45 tablet 1   diltiazem (DILACOR XR) 180 MG 24 hr capsule Take 1 capsule (180 mg total) by mouth daily. 90 capsule 3   doxycycline (VIBRAMYCIN) 100 MG capsule Take 100 mg by mouth 2 (two) times daily.     ELIQUIS 5 MG TABS tablet TAKE 1 TABLET BY MOUTH TWICE A DAY 180 tablet 3   lamoTRIgine (LAMICTAL) 150 MG tablet Take 2 tablets (300 mg total) by mouth at bedtime. 180 tablet 1   lisinopril (ZESTRIL) 20 MG tablet TAKE 1 TABLET BY MOUTH EVERY DAY 90 tablet 1   Magnesium 400 MG CAPS Take 400 mg by mouth at bedtime.     risperiDONE (RISPERDAL) 3 MG tablet Take 1 tablet (3  mg total) by mouth at bedtime. 90 tablet 1   traZODone (DESYREL) 50 MG tablet TAKE 1 TO 2 TABLETS BY MOUTH AT BEDTIME AS NEEDED FOR SLEEP 180 tablet 0   No current facility-administered medications for this visit.    Allergies:   Trazodone and nefazodone and Soap    ROS:  Please see the history of present illness.   Otherwise, review of systems are positive for none.   All other systems are reviewed and negative.    PHYSICAL EXAM: VS:  BP 121/84   Pulse 98   Ht '5\' 4"'$  (1.626 m)   Wt 177 lb 3.2 oz (80.4 kg)   SpO2 96%   BMI 30.42 kg/m  , BMI Body mass index is 30.42 kg/m.  GENERAL:  Well appearing NECK:  No jugular venous distention, waveform within normal limits, carotid upstroke brisk and symmetric, no bruits, no thyromegaly LUNGS:  Clear to auscultation bilaterally CHEST:  Unremarkable HEART:  PMI not displaced or sustained,S1 and S2 within normal limits, no  S3,  no clicks, no rubs, no murmurs, irregular  ABD:  Flat, positive bowel sounds normal in frequency in pitch, no bruits, no rebound, no guarding, no midline pulsatile mass, no hepatomegaly, no splenomegaly EXT:  2 plus pulses throughout, no edema, no cyanosis no clubbing  EKG:  EKG is  ordered today. Sinus rhythm, rate 98, axis within normal limits, intervals within normal limits, poor anterior R wave progression, premature ventricular contraction.  No acute ST-T wave changes.  Recent Labs: 07/15/2021: ALT 27; Magnesium 2.0; TSH 1.998 07/16/2021: Hemoglobin 13.0; Platelets 235 04/26/2022: BUN 9; Creatinine, Ser 0.67; Potassium 3.9; Sodium 134    Lipid Panel    Component Value Date/Time   CHOL 217 (H) 07/15/2021 0908   CHOL 226 (H) 09/08/2019 1118   TRIG 82.0 07/15/2021 0908   HDL 152.10 07/15/2021 0908   HDL 145 09/08/2019 1118   CHOLHDL 1 07/15/2021 0908   VLDL 16.4 07/15/2021 0908   LDLCALC 48 07/15/2021 0908   LDLCALC 71 09/08/2019 1118   LDLDIRECT 58.8 08/04/2013 1102      Wt Readings from Last 3 Encounters:  05/09/22 177 lb 3.2 oz (80.4 kg)  04/26/22 176 lb 6.4 oz (80 kg)  12/13/21 172 lb 12.8 oz (78.4 kg)      Other studies Reviewed: Additional studies/ records that were reviewed today include:  Labs Review of the above records demonstrates:    See elsewhere   ASSESSMENT AND PLAN:   ATRIAL FIB.  Margaret Mckenzie has a CHA2DS2 - VASc score of 3.  She has good heart rate and tolerates the medication.  No change in therapy.  She is totally asymptomatic as this is chronic longstanding fibrillation would not suggest ablation or antiarrhythmic therapy.   HTN:  The blood pressure is controlled.  She will continue the meds as listed.  Of note her blood pressure cuff was not accurate and she will be getting a new 1.    Current medicines are reviewed at length with the patient today.  The patient does not have concerns regarding medicines.  The following  changes have been made:  None  Labs/ tests ordered today include:   None  Orders Placed This Encounter  Procedures   EKG 12-Lead      Disposition:   FU with 12 months.    Signed, Minus Breeding, MD  05/09/2022 2:15 PM    [facility]

## 2022-05-09 ENCOUNTER — Encounter: Payer: Self-pay | Admitting: Cardiology

## 2022-05-09 ENCOUNTER — Ambulatory Visit: Payer: PPO | Admitting: Cardiology

## 2022-05-09 VITALS — BP 121/84 | HR 98 | Ht 64.0 in | Wt 177.2 lb

## 2022-05-09 DIAGNOSIS — I482 Chronic atrial fibrillation, unspecified: Secondary | ICD-10-CM

## 2022-05-09 DIAGNOSIS — I10 Essential (primary) hypertension: Secondary | ICD-10-CM

## 2022-05-09 NOTE — Patient Instructions (Signed)
Medication Instructions:  Your physician recommends that you continue on your current medications as directed. Please refer to the Current Medication list given to you today.  *If you need a refill on your cardiac medications before your next appointment, please call your pharmacy*  Follow-Up: At [facility], you and your health needs are our priority.  As part of our continuing mission to provide you with exceptional heart care, we have created designated Provider Care Teams.  These Care Teams include your primary Cardiologist (physician) and Advanced Practice Providers (APPs -  Physician Assistants and Nurse Practitioners) who all work together to provide you with the care you need, when you need it.  We recommend signing up for the patient portal called "MyChart".  Sign up information is provided on this After Visit Summary.  MyChart is used to connect with patients for Virtual Visits (Telemedicine).  Patients are able to view lab/test results, encounter notes, upcoming appointments, etc.  Non-urgent messages can be sent to your provider as well.   To learn more about what you can do with MyChart, go to [email or web address].    Your next appointment:   12 month(s)  The format for your next appointment:   In Person  Provider:   Minus Breeding, MD {    Important Information About Sugar

## 2022-05-11 ENCOUNTER — Other Ambulatory Visit: Payer: Self-pay | Admitting: Internal Medicine

## 2022-05-12 ENCOUNTER — Telehealth: Payer: PPO

## 2022-07-07 DIAGNOSIS — E669 Obesity, unspecified: Secondary | ICD-10-CM | POA: Diagnosis not present

## 2022-07-07 DIAGNOSIS — F419 Anxiety disorder, unspecified: Secondary | ICD-10-CM | POA: Diagnosis not present

## 2022-07-07 DIAGNOSIS — I4891 Unspecified atrial fibrillation: Secondary | ICD-10-CM | POA: Diagnosis not present

## 2022-07-07 DIAGNOSIS — F319 Bipolar disorder, unspecified: Secondary | ICD-10-CM | POA: Diagnosis not present

## 2022-07-07 DIAGNOSIS — G4700 Insomnia, unspecified: Secondary | ICD-10-CM | POA: Diagnosis not present

## 2022-07-07 DIAGNOSIS — Z87891 Personal history of nicotine dependence: Secondary | ICD-10-CM | POA: Diagnosis not present

## 2022-07-07 DIAGNOSIS — I10 Essential (primary) hypertension: Secondary | ICD-10-CM | POA: Diagnosis not present

## 2022-07-07 DIAGNOSIS — F411 Generalized anxiety disorder: Secondary | ICD-10-CM | POA: Diagnosis not present

## 2022-07-11 ENCOUNTER — Telehealth: Payer: Self-pay | Admitting: *Deleted

## 2022-07-11 ENCOUNTER — Telehealth: Payer: Self-pay | Admitting: Pharmacist

## 2022-07-11 DIAGNOSIS — F319 Bipolar disorder, unspecified: Secondary | ICD-10-CM

## 2022-07-11 DIAGNOSIS — I482 Chronic atrial fibrillation, unspecified: Secondary | ICD-10-CM

## 2022-07-11 DIAGNOSIS — I10 Essential (primary) hypertension: Secondary | ICD-10-CM

## 2022-07-11 NOTE — Telephone Encounter (Signed)
-----   Message from Viona Gilmore, [facility] sent at 07/11/2022  9:31 AM EDT ----- Regarding: CCM referral Hi,  Can you please put in a CCM referral for pharmacy for Margaret Mckenzie?  Thank you! Maddie

## 2022-07-11 NOTE — Telephone Encounter (Signed)
Referral placed.

## 2022-07-11 NOTE — Chronic Care Management (AMB) (Signed)
    Chronic Care Management Pharmacy Assistant   Name: Margaret Mckenzie  MRN: [id number] DOB: 1951/06/25  07/12/2022 APPOINTMENT REMINDER  Called Margaret Mckenzie, No answer, left message of appointment on 07/12/2022 at 4:00 via telephone visit with [name], Pharm D. Notified to have all medications, supplements, blood pressure and/or blood sugar logs available during appointment and to return call if need to reschedule.   Care Gaps: AWV - previous message to Ramond Craver Last BP - 121/84 on 05/09/2022 Covid booster - overdue Flu - due  Star Rating Drug: Lisinopril 20 mg - last filled 04/30/2022 90 DS at [facility]  Any gaps in medications fill history? No  Gennie [facility]  Catering manager [phone]

## 2022-07-12 ENCOUNTER — Ambulatory Visit (INDEPENDENT_AMBULATORY_CARE_PROVIDER_SITE_OTHER): Payer: PPO | Admitting: Pharmacist

## 2022-07-12 DIAGNOSIS — I10 Essential (primary) hypertension: Secondary | ICD-10-CM

## 2022-07-12 DIAGNOSIS — I482 Chronic atrial fibrillation, unspecified: Secondary | ICD-10-CM

## 2022-07-12 NOTE — Progress Notes (Signed)
Chronic Care Management Pharmacy Note  07/17/2022 Name:  Margaret Mckenzie MRN:  [id number] DOB:  [birth year]  Summary: BP at goal < 140/90 Pt continues to vape with tobacco  Recommendations/Changes made from today's visit: -Recommended bringing new BP cuff to next office visit to ensure accuracy -Recommended lung cancer screening for previous smoking and current vaping with tobacco  Plan: Scheduled CPE Apply for Eliquis PAP BP assessment in 2-3 months Follow up in 6 months  Subjective: Margaret Mckenzie is a 71 y.o. year old female who is a primary patient of Isaac Bliss, Rayford Halsted, MD.  The CCM team was consulted for assistance with disease management and care coordination needs.    Engaged with patient by telephone for follow up visit in response to provider referral for pharmacy case management and/or care coordination services.   Consent to Services:  The patient was given information about Chronic Care Management services, agreed to services, and gave verbal consent prior to initiation of services.  Please see initial visit note for detailed documentation.   Patient Care Team: Isaac Bliss, Rayford Halsted, MD as PCP - General (Internal Medicine) Minus Breeding, MD as PCP - Cardiology (Cardiology) Group, [facility]) Viona Gilmore, [facility] as Pharmacist (Pharmacist)  Recent office visits: 04/26/22 [name] MD: Patient presented for hyponatremia and chronic conditions follow up. No medication changes. Follow up in 6 months for CPE.   Recent consult visits: 05/09/2021 Minus Breeding MD (cardiology) - Patient was seen for Paroxysmal atrial fibrillation follow up. Recommended new BP cuff. Follow up in 1 year.   04/18/22 Conception Chancy, PsyD (behavioral health): Patient presented for anxiety follow up. Unable to access notes.  02/01/2022 Donnal Moat PA-C (psychiatry) - Patient was seen for Bipolar I disorder and  additional concerns. Decreased Lamotrigine to 300 mg nightly. Discontinued Alprazolam and Zolpidem. Follow up in 6 months.   Hospital visits: Admitted to [facility] on 07/15/2021 due to Hyponatremia. Discharge date was 07/16/2021. Discharged from [facility]?Medications Started at [facility] Discharge:?? -None Medication Changes at Hospital Discharge: -None Medications Discontinued at Hospital Discharge: - None Medications that remain the same after Hospital Discharge:??  -All other medications will remain the same.   Objective:  Lab Results  Component Value Date   CREATININE 0.67 04/26/2022   BUN 9 04/26/2022   GFR 88.83 04/26/2022   GFRNONAA >60 07/16/2021   GFRAA 99 09/08/2019   NA 134 (L) 04/26/2022   K 3.9 04/26/2022   CALCIUM 9.8 04/26/2022   CO2 27 04/26/2022   GLUCOSE 76 04/26/2022    Lab Results  Component Value Date/Time   HGBA1C 5.1 07/15/2021 09:08 AM   HGBA1C 5.2 04/23/2020 08:24 AM   HGBA1C 5.0 06/04/2017 12:00 AM   GFR 88.83 04/26/2022 01:44 PM   GFR 85.42 07/29/2021 01:17 PM    Last diabetic Eye exam: No results found for: "HMDIABEYEEXA"  Last diabetic Foot exam: No results found for: "HMDIABFOOTEX"   Lab Results  Component Value Date   CHOL 217 (H) 07/15/2021   HDL 152.10 07/15/2021   LDLCALC 48 07/15/2021   LDLDIRECT 58.8 08/04/2013   TRIG 82.0 07/15/2021   CHOLHDL 1 07/15/2021       Latest Ref Rng & Units 07/15/2021    9:08 AM 04/23/2020    8:24 AM 03/02/2020    7:38 AM  Hepatic Function  Total Protein 6.0 - 8.3 g/dL 7.4  6.6  6.8   Albumin 3.5 -  5.2 g/dL 4.5  4.4  4.4   AST 0 - 37 U/L 35  14  16   ALT 0 - 35 U/L _0 Alk Phosphatase 39 - 117 U/L 80  45  57   Total Bilirubin 0.2 - 1.2 mg/dL 1.5  0.7  0.3     Lab Results  Component Value Date/Time   TSH 1.998 07/15/2021 03:22 PM   TSH 2.46 07/15/2021 09:08 AM   TSH 1.67 04/23/2020 08:24 AM       Latest Ref Rng & Units 07/16/2021     1:37 AM 07/15/2021    9:08 AM 04/23/2020    8:24 AM  CBC  WBC 4.0 - 10.5 K/uL 6.1  6.4  7.3   Hemoglobin 12.0 - 15.0 g/dL 13.0  13.5  12.4   Hematocrit 36.0 - 46.0 % 37.4  39.5  37.1   Platelets 150 - 400 K/uL 235  250.0  284.0     Lab Results  Component Value Date/Time   VD25OH 55.77 07/15/2021 09:08 AM   VD25OH 54.57 04/23/2020 08:24 AM    Clinical ASCVD: No  The ASCVD Risk score (Arnett DK, et al., 2019) failed to calculate for the following reasons:   The valid HDL cholesterol range is 20 to 100 mg/dL       04/26/2022    1:41 PM 12/13/2021    1:32 PM 10/26/2021   12:59 PM  Depression screen PHQ 2/9  Decreased Interest 0 2 0  Down, Depressed, Hopeless 0 2 0  PHQ - 2 Score 0 4 0  Altered sleeping _1 Tired, decreased energy 0 1 0  Change in appetite 1 1 0  Feeling bad or failure about yourself  0 0 0  Trouble concentrating 0 0 0  Moving slowly or fidgety/restless 0 0 0  Suicidal thoughts 0 0 0  PHQ-9 Score _2 Difficult doing work/chores Not difficult at all Not difficult at all Not difficult at all    CHA2DS2/VAS Stroke Risk Points  Current as of 13 minutes ago     3 >= 2 Points: High Risk  1 - 1.99 Points: Medium Risk  0 Points: Low Risk    Last Change: N/A      Details    This score determines the patient's risk of having a stroke if the  patient has atrial fibrillation.       Points Metrics  0 Has Congestive Heart Failure:  No    Current as of 13 minutes ago  0 Has Vascular Disease:  No    Current as of 13 minutes ago  1 Has Hypertension:  Yes    Current as of 13 minutes ago  1 Age:  71    Current as of 13 minutes ago  0 Has Diabetes:  No    Current as of 13 minutes ago  0 Had Stroke:  No  Had TIA:  No  Had Thromboembolism:  No    Current as of 13 minutes ago  1 Female:  Yes    Current as of 13 minutes ago       Social History   Tobacco Use  Smoking Status Former   Packs/day: 0.50   Types: Cigarettes   Quit date: 07/11/2017   Years  since quitting: 5.0  Smokeless Tobacco Never  Tobacco Comments   pt has quit smoking   BP Readings from Last 3 Encounters:  05/09/22 121/84  04/26/22 132/78  12/13/21 130/72   Pulse Readings from Last 3 Encounters:  05/09/22 98  04/26/22 84  12/13/21 100   Wt Readings from Last 3 Encounters:  05/09/22 177 lb 3.2 oz (80.4 kg)  04/26/22 176 lb 6.4 oz (80 kg)  12/13/21 172 lb 12.8 oz (78.4 kg)   BMI Readings from Last 3 Encounters:  05/09/22 30.42 kg/m  04/26/22 30.28 kg/m  12/13/21 29.66 kg/m    Assessment/Interventions: Review of patient past medical history, allergies, medications, health status, including review of consultants reports, laboratory and other test data, was performed as part of comprehensive evaluation and provision of chronic care management services.   SDOH:  (Social Determinants of Health) assessments and interventions performed: Yes SDOH Interventions    Flowsheet Row Most Recent Value  SDOH Interventions   Financial Strain Interventions Other (Comment)  [working on patient assistance]       SDOH Screenings   Alcohol Screen: Not on file  Depression (PHQ2-9): Low Risk  (04/26/2022)   Depression (PHQ2-9)    PHQ-2 Score: 2  Financial Resource Strain: Medium Risk (07/17/2022)   Overall Financial Resource Strain (CARDIA)    Difficulty of Paying Living Expenses: Somewhat hard  Food Insecurity: Not on file  Housing: Not on file  Physical Activity: Not on file  Social Connections: Not on file  Stress: Not on file  Tobacco Use: Medium Risk (05/09/2022)   Patient History    Smoking Tobacco Use: Former    Smokeless Tobacco Use: Never    Passive Exposure: Not on file  Transportation Needs: No Transportation Needs (11/11/2021)   PRAPARE - Hydrologist (Medical): No    Lack of Transportation (Non-Medical): No    CCM Care Plan  Allergies  Allergen Reactions   Trazodone And Nefazodone Other (See Comments)    Trazodone  causes lethargy the morning after taking it   Soap Rash    Patients states soap at work drys out hands and has to use special cream to sooth skin.    Medications Reviewed Today     Reviewed by Minus Breeding, MD (Physician) on 05/09/22 at 1415  Med List Status: <None>   Medication Order Taking? Sig Documenting Provider Last Dose Status Informant  Ascorbic Acid (VITAMIN C) 1000 MG tablet [id number] Yes Take 1,000 mg by mouth at bedtime. [provider] Taking Active Self  busPIRone (BUSPAR) 15 MG tablet [id number] Yes Take 1 tablet (15 mg total) by mouth daily. Addison Lank, PA-C Taking Active   Calcium Carbonate (CALCIUM 600 PO) [id number] Yes Take 1,200 mg by mouth at bedtime. [provider] Taking Active Self  Cholecalciferol (VITAMIN D-3) 25 MCG (1000 UT) CAPS [id number] Yes Take 4,000 Units by mouth at bedtime. [provider] Taking Active Self  cyanocobalamin (,VITAMIN B-12,) 1000 MCG/ML injection [id number] Yes INJECT 1 ML (1,000 MCG TOTAL) INTO THE MUSCLE EVERY 30 DAYS. Isaac Bliss, Rayford Halsted, MD Taking Active   diazepam (VALIUM) 10 MG tablet [id number] Yes Take 0.5-1 tablets (5-10 mg total) by mouth every 12 (twelve) hours as needed for anxiety. Addison Lank, PA-C Taking Active   diltiazem (CARDIZEM) 30 MG tablet [id number] Yes Take 1 tablet every 4 hours AS NEEDED for AFIB heart rate >100 Minus Breeding, MD Taking Active   diltiazem (DILACOR XR) 180 MG 24 hr capsule [id number] Yes Take 1 capsule (180 mg total) by mouth daily. Minus Breeding, MD Taking Active   doxycycline (VIBRAMYCIN) 100 MG capsule [id number] Yes Take  100 mg by mouth 2 (two) times daily. [provider] Taking Active   ELIQUIS 5 MG TABS tablet [id number] Yes TAKE 1 TABLET BY MOUTH TWICE A Kelton Pillar, MD Taking Active   lamoTRIgine (LAMICTAL) 150 MG tablet [id number] Yes Take 2 tablets (300 mg total) by mouth at bedtime. Donnal Moat T, PA-C Taking Active   lisinopril  (ZESTRIL) 20 MG tablet [id number] Yes TAKE 1 TABLET BY MOUTH EVERY DAY Isaac Bliss, Rayford Halsted, MD Taking Active   Magnesium 400 MG CAPS [id number] Yes Take 400 mg by mouth at bedtime. [provider] Taking Active Self  risperiDONE (RISPERDAL) 3 MG tablet [id number] Yes Take 1 tablet (3 mg total) by mouth at bedtime. Donnal Moat T, PA-C Taking Active   traZODone (DESYREL) 50 MG tablet [id number] Yes TAKE 1 TO 2 TABLETS BY MOUTH AT BEDTIME AS NEEDED FOR SLEEP Addison Lank, PA-C Taking Active             Patient Active Problem List   Diagnosis Date Noted   Chronic atrial fibrillation ([place]) 05/08/2022   Hyponatremia 07/15/2021   Generalized anxiety disorder 10/25/2020   Panic disorder 10/25/2020   Bipolar I disorder ([place]) 10/25/2020   Insomnia 10/25/2020   Vitamin B12 deficiency 04/23/2020   Paroxysmal atrial fibrillation ([place]) 08/31/2017   Essential hypertension 08/31/2017    Immunization History  Administered Date(s) Administered   Fluad Quad(high Dose 65+) 07/31/2019, 07/29/2021   Influenza, High Dose Seasonal PF 09/03/2017, 09/10/2018   Influenza,inj,Quad PF,6+ Mos 08/04/2013   Influenza-Unspecified 07/31/2019, 08/11/2020   PFIZER(Purple Top)SARS-COV-2 Vaccination 01/31/2020, 03/02/2020, 10/29/2020, 01/25/2021, 09/19/2021   Pneumococcal Conjugate-13 09/03/2017   Pneumococcal Polysaccharide-23 09/10/2018   Tdap 06/17/2013   Zoster Recombinat (Shingrix) 04/27/2017, 11/27/2017   Zoster, Live 08/14/2017   Patient reports she didn't sleep well last night and it just happens every once in a while. Patient denies major issues with sleep right now.  Patient is vaping with nicotine currently. Patient quit smoking about 5 years ago but only switched to vaping. She hasn't thought about quitting all together and does not want to discuss at this time.  Patient reports she bought a new BP cuff about a month ago because hers was off from the previous visit when she brought it  in. She has an arm cuff now instead of the wrist cuff.  Patient is also doing HIIT training now which seems to be helping with her anxiety. She does this about every other day and enjoys it.   Conditions to be addressed/monitored:  Hypertension, Atrial Fibrillation, Anxiety, and Bipolar disorder and Insomnia  Conditions addressed this visit: Hypertension, Afib  Care Plan : [place]  Updates made by Viona Gilmore, [place] since 07/17/2022 12:00 AM     Problem: Problem: Hypertension, Atrial Fibrillation, Anxiety, and Bipolar disorder and Insomnia      Long-Range Goal: Patient-Specific Goal   Start Date: 11/11/2021  Expected End Date: 11/11/2022  Recent Progress: On track  Priority: High  Note:   Current Barriers:  Unable to independently monitor therapeutic efficacy  Pharmacist Clinical Goal(s):  Patient will achieve adherence to monitoring guidelines and medication adherence to achieve therapeutic efficacy through collaboration with PharmD and provider.   Interventions: 1:1 collaboration with Isaac Bliss, Rayford Halsted, MD regarding development and update of comprehensive plan of care as evidenced by provider attestation and co-signature Inter-disciplinary care team collaboration (see longitudinal plan of care) Comprehensive medication review performed; medication list updated in electronic medical record  Hypertension (  BP goal <130/80) -Not ideally controlled -Current treatment: Lisinopril 20 mg 1 tablet daily - in AM - Appropriate, Effective, Safe, Accessible Diltiazem 120 mg 1 capsule daily - in AM - Appropriate, Effective, Safe, Accessible -Medications previously tried: n/a  -Current home readings: 137/87, 139/88, 142/92, 132/81, 127/78, 139/99 - arm cuff (checking every day)  -Current dietary habits: doesn't add a lot of salt but doesn't look at sodium on package labels; no canned foods and mostly frozen or fresh vegetables; doesn't eat out often -Current  exercise habits: on her feet at work but no formal exercise -Denies hypotensive/hypertensive symptoms -Educated on BP goals and benefits of medications for prevention of heart attack, stroke and kidney damage; Importance of home blood pressure monitoring; Proper BP monitoring technique; Symptoms of hypotension and importance of maintaining adequate hydration; -Counseled to monitor BP at home at least weekly, document, and provide log at future appointments -Counseled on diet and exercise extensively Recommended to continue current medication  Atrial Fibrillation (Goal: prevent stroke and major bleeding) -Controlled -CHADSVASC: 3 -Current treatment: Rate control: Diltiazem 180 mg 1 capsule daily - Appropriate, Effective, Safe, Accessible; diltiazem 30 mg  1 capsule daily - Appropriate, Effective, Safe, Accessible Anticoagulation: Eliquis 5 mg 1 tablet twice daily - Appropriate, Effective, Safe, Accessible -Medications previously tried: n/a -Home BP and HR readings: 103, 105, 112  -Counseled on increased risk of stroke due to Afib and benefits of anticoagulation for stroke prevention; importance of adherence to anticoagulant exactly as prescribed; bleeding risk associated with Eliquis and importance of self-monitoring for signs/symptoms of bleeding; avoidance of NSAIDs due to increased bleeding risk with anticoagulants; -Recommended to continue current medication Counseled on trigger for Afib such as caffeine and chocolate. Assessed patient finances. Patient likely will qualify for PAP. Plan to apply in 2023.  Bipolar disorder/Anxiety (Goal: minimize symptoms) -Not ideally controlled -Current treatment: Diazepam  (every day twice a day) - Appropriate, Effective, Query Safe, Accessible Buspirone 15 mg 1 tablet daily - Query Appropriate, Query effective, Safe, Accessible Lamotrigine 150 mg 2 tablets at bedtime - Appropriate, Query effective, Safe, Accessible Risperidone 3 mg 1 tablet at  bedtime - Appropriate, Query effective, Safe, Accessible -Medications previously tried/failed: Depakote, Lamictal, Prozac, Lexapro -PHQ9: 1 -GAD7: n/a -Educated on Benefits of medication for symptom control Benefits of cognitive-behavioral therapy with or without medication -Counseled on risks for long term use of benzodiazpines such as memory loss, falls, and fractures. Recommended limiting [place] of diazepam and recommended slowly trying to self taper to as needed. Educated on benefits of high intensity exercise for anxiety management.  Insomnia (Goal: improve quality and quantity of sleep) -Controlled -Current treatment  Trazodone 50 mg 1-2 tablets at bedtime as needed - Appropriate, Effective, Safe, Accessible -Medications previously tried: Quarry manager (caused amnesia),  trazodone (ineffective at low doses but caused dizziness at higher doses), zolpidem (no longer needed) -Counseled on practicing good sleep hygiene by setting a sleep schedule and maintaining it, avoid excessive napping, following a nightly routine, avoiding screen time for 30-60 minutes before going to bed, and making the bedroom a cool, quiet and dark space   Vitamin B12 deficiency (Goal: 211-911) -Controlled -Current treatment  Vitamin B12 inject 1000 mcg every 30 days - Appropriate, Effective, Safe, Accessible -Medications previously tried: none  -Recommended repeat vitamin B12 level.  Osteopenia (Goal prevent fractures) -Controlled -Last DEXA Scan: 07/22/21   T-Score femoral neck: -2.3  T-Score total hip: n/a  T-Score lumbar spine: -0.8  T-Score forearm radius: n/a  10-year probability of major osteoporotic  fracture: 13.0%  10-year probability of hip fracture: 2.8% -Patient is not a candidate for pharmacologic treatment -Current treatment  Vitamin D 1000 units at bedtime - Appropriate, Effective, Safe, Accessible Calcium carbonate 600 mg & vitamin D (1000 mcg) 2 tablets at bedtime - Appropriate, Effective, Safe,  Accessible -Medications previously tried: none  -Recommend [phone] units of vitamin D daily. Recommend 1200 mg of calcium daily from dietary and supplemental sources. Recommend weight-bearing and muscle strengthening exercises for building and maintaining bone density. -Recommended decreasing vitamin D to 2000 units per day and separating calcium to twice daily.   Health Maintenance -Vaccine gaps:none -Current therapy:  Magnesium 500 mg 1 tablet at bedtime Vitamin C 1000 mg 1 tablet at bedtime -Educated on Cost vs benefit of each product must be carefully weighed by individual consumer Supplements may interfere with prescription drugs -Patient is satisfied with current therapy and denies issues -Recommended to continue current medication  Patient Goals/Self-Care Activities Patient will:  - take medications as prescribed as evidenced by patient report and record review check blood pressure at least weekly, document, and provide at future appointments target a minimum of 150 minutes of moderate intensity exercise weekly  Follow Up Plan: Telephone follow up appointment with care management team member scheduled for: 6 months        Medication Assistance: None required.  Patient affirms current coverage meets needs.  Compliance/Adherence/Medication fill history: Care Gaps: COVID booster, influenza vaccine Last BP - 121/84 on 05/09/2022  Star-Rating Drugs: Lisinopril 20 mg - last filled 04/30/2022 [facility]  Patient's preferred pharmacy is:  [id number] [place] [id number]: [phone]: [phone] Uses pill box? Yes - weekly Pt endorses 99% compliance  We discussed: Current pharmacy is preferred with insurance plan and patient is satisfied with pharmacy services Patient decided to: Continue current medication management strategy  Care Plan and Follow Up Patient Decision:  Patient  agrees to Care Plan and Follow-up.  Plan: Telephone follow up appointment with care management team member scheduled for:  6 months  MJeni Salles PharmD, BChrismanat [phone]

## 2022-07-17 ENCOUNTER — Telehealth: Payer: Self-pay | Admitting: Pharmacist

## 2022-07-17 NOTE — Chronic Care Management (AMB) (Signed)
    Chronic Care Management Pharmacy Assistant   Name: Margaret Mckenzie  MRN: [id number] DOB: [birth year]  Reason for Encounter: Patient assistance application for Eliquis.   Patient assistance application completed, to be mailed to patient 07/20/2022.    [place] Pharmacist Assistant [phone]

## 2022-07-17 NOTE — Patient Instructions (Signed)
Hi Margaret Mckenzie,  It was great to talk to you again today. I am glad you are enjoying doing HIIT exercises, keep up the good work with doing those!  Please reach out to me if you have any questions or need anything before our follow up!  Best, Maddie  [name], PharmD, [place] Pharmacist [facility]   Visit Information   Goals Addressed   None    Patient Care Plan: CCM Pharmacy Care Plan     Problem Identified: Problem: Hypertension, Atrial Fibrillation, Anxiety, and Bipolar disorder and Insomnia      Long-Range Goal: Patient-Specific Goal   Start Date: 11/11/2021  Expected End Date: 11/11/2022  Recent Progress: On track  Priority: High  Note:   Current Barriers:  Unable to independently monitor therapeutic efficacy  Pharmacist Clinical Goal(s):  Patient will achieve adherence to monitoring guidelines and medication adherence to achieve therapeutic efficacy through collaboration with PharmD and provider.   Interventions: 1:1 collaboration with Isaac Bliss, Rayford Halsted, MD regarding development and update of comprehensive plan of care as evidenced by provider attestation and co-signature Inter-disciplinary care team collaboration (see longitudinal plan of care) Comprehensive medication review performed; medication list updated in electronic medical record  Hypertension (BP goal <130/80) -Not ideally controlled -Current treatment: Lisinopril 20 mg 1 tablet daily - in AM - Appropriate, Effective, Safe, Accessible Diltiazem 120 mg 1 capsule daily - in AM - Appropriate, Effective, Safe, Accessible -Medications previously tried: n/a  -Current home readings: 137/87, 139/88, 142/92, 132/81, 127/78, 139/99 - arm cuff (checking every day)  -Current dietary habits: doesn't add a lot of salt but doesn't look at sodium on package labels; no canned foods and mostly frozen or fresh vegetables; doesn't eat out often -Current exercise habits:  on her feet at work but no formal exercise -Denies hypotensive/hypertensive symptoms -Educated on BP goals and benefits of medications for prevention of heart attack, stroke and kidney damage; Importance of home blood pressure monitoring; Proper BP monitoring technique; Symptoms of hypotension and importance of maintaining adequate hydration; -Counseled to monitor BP at home at least weekly, document, and provide log at future appointments -Counseled on diet and exercise extensively Recommended to continue current medication  Atrial Fibrillation (Goal: prevent stroke and major bleeding) -Controlled -CHADSVASC: 3 -Current treatment: Rate control: Diltiazem 180 mg 1 capsule daily - Appropriate, Effective, Safe, Accessible; diltiazem 30 mg  1 capsule daily - Appropriate, Effective, Safe, Accessible Anticoagulation: Eliquis 5 mg 1 tablet twice daily - Appropriate, Effective, Safe, Accessible -Medications previously tried: n/a -Home BP and HR readings: 103, 105, 112  -Counseled on increased risk of stroke due to Afib and benefits of anticoagulation for stroke prevention; importance of adherence to anticoagulant exactly as prescribed; bleeding risk associated with Eliquis and importance of self-monitoring for signs/symptoms of bleeding; avoidance of NSAIDs due to increased bleeding risk with anticoagulants; -Recommended to continue current medication Counseled on trigger for Afib such as caffeine and chocolate. Assessed patient finances. Patient likely will qualify for PAP. Plan to apply in 2023.  Bipolar disorder/Anxiety (Goal: minimize symptoms) -Not ideally controlled -Current treatment: Diazepam  (every day twice a day) - Appropriate, Effective, Query Safe, Accessible Buspirone 15 mg 1 tablet daily - Query Appropriate, Query effective, Safe, Accessible Lamotrigine 150 mg 2 tablets at bedtime - Appropriate, Query effective, Safe, Accessible Risperidone 3 mg 1 tablet at bedtime -  Appropriate, Query effective, Safe, Accessible -Medications previously tried/failed: Depakote, Lamictal, Prozac, Lexapro -PHQ9: 1 -GAD7: n/a -Educated on Benefits of medication  for symptom control Benefits of cognitive-behavioral therapy with or without medication -Counseled on risks for long term use of benzodiazpines such as memory loss, falls, and fractures. Recommended limiting [place] of diazepam and recommended slowly trying to self taper to as needed. Educated on benefits of high intensity exercise for anxiety management.  Insomnia (Goal: improve quality and quantity of sleep) -Controlled -Current treatment  Trazodone 50 mg 1-2 tablets at bedtime as needed - Appropriate, Effective, Safe, Accessible -Medications previously tried: Quarry manager (caused amnesia),  trazodone (ineffective at low doses but caused dizziness at higher doses), zolpidem (no longer needed) -Counseled on practicing good sleep hygiene by setting a sleep schedule and maintaining it, avoid excessive napping, following a nightly routine, avoiding screen time for 30-60 minutes before going to bed, and making the bedroom a cool, quiet and dark space   Vitamin B12 deficiency (Goal: 211-911) -Controlled -Current treatment  Vitamin B12 inject 1000 mcg every 30 days - Appropriate, Effective, Safe, Accessible -Medications previously tried: none  -Recommended repeat vitamin B12 level.  Osteopenia (Goal prevent fractures) -Controlled -Last DEXA Scan: 07/22/21   T-Score femoral neck: -2.3  T-Score total hip: n/a  T-Score lumbar spine: -0.8  T-Score forearm radius: n/a  10-year probability of major osteoporotic fracture: 13.0%  10-year probability of hip fracture: 2.8% -Patient is not a candidate for pharmacologic treatment -Current treatment  Vitamin D 1000 units at bedtime - Appropriate, Effective, Safe, Accessible Calcium carbonate 600 mg & vitamin D (1000 mcg) 2 tablets at bedtime - Appropriate, Effective, Safe,  Accessible -Medications previously tried: none  -Recommend [phone] units of vitamin D daily. Recommend 1200 mg of calcium daily from dietary and supplemental sources. Recommend weight-bearing and muscle strengthening exercises for building and maintaining bone density. -Recommended decreasing vitamin D to 2000 units per day and separating calcium to twice daily.   Health Maintenance -Vaccine gaps:none -Current therapy:  Magnesium 500 mg 1 tablet at bedtime Vitamin C 1000 mg 1 tablet at bedtime -Educated on Cost vs benefit of each product must be carefully weighed by individual consumer Supplements may interfere with prescription drugs -Patient is satisfied with current therapy and denies issues -Recommended to continue current medication  Patient Goals/Self-Care Activities Patient will:  - take medications as prescribed as evidenced by patient report and record review check blood pressure at least weekly, document, and provide at future appointments target a minimum of 150 minutes of moderate intensity exercise weekly  Follow Up Plan: Telephone follow up appointment with care management team member scheduled for: 6 months       Patient verbalizes understanding of instructions and care plan provided today and agrees to view in [place]. Active MyChart status and patient understanding of how to access instructions and care plan via MyChart confirmed with patient.    Telephone follow up appointment with pharmacy team member scheduled for: 6 months  Viona Gilmore, [facility]

## 2022-07-27 DIAGNOSIS — I4891 Unspecified atrial fibrillation: Secondary | ICD-10-CM

## 2022-07-27 DIAGNOSIS — I10 Essential (primary) hypertension: Secondary | ICD-10-CM | POA: Diagnosis not present

## 2022-07-27 DIAGNOSIS — F319 Bipolar disorder, unspecified: Secondary | ICD-10-CM

## 2022-07-28 ENCOUNTER — Other Ambulatory Visit: Payer: Self-pay | Admitting: Internal Medicine

## 2022-07-28 ENCOUNTER — Other Ambulatory Visit: Payer: Self-pay | Admitting: Cardiology

## 2022-07-28 ENCOUNTER — Other Ambulatory Visit: Payer: Self-pay | Admitting: Physician Assistant

## 2022-07-28 DIAGNOSIS — I480 Paroxysmal atrial fibrillation: Secondary | ICD-10-CM

## 2022-07-28 DIAGNOSIS — I10 Essential (primary) hypertension: Secondary | ICD-10-CM

## 2022-07-28 NOTE — Telephone Encounter (Signed)
Prescription refill request for Eliquis received. Indication:Afib  Last office visit: 05/09/22 ([place])  Scr: 0.67 (04/26/22)  Age: 71 Weight: 80.4  Appropriate dose and refill sent to requested pharmacy

## 2022-08-09 ENCOUNTER — Ambulatory Visit: Payer: PPO | Admitting: Physician Assistant

## 2022-08-09 ENCOUNTER — Encounter: Payer: Self-pay | Admitting: Physician Assistant

## 2022-08-09 DIAGNOSIS — F410 Panic disorder [episodic paroxysmal anxiety] without agoraphobia: Secondary | ICD-10-CM | POA: Diagnosis not present

## 2022-08-09 DIAGNOSIS — F319 Bipolar disorder, unspecified: Secondary | ICD-10-CM | POA: Diagnosis not present

## 2022-08-09 DIAGNOSIS — F411 Generalized anxiety disorder: Secondary | ICD-10-CM | POA: Diagnosis not present

## 2022-08-09 DIAGNOSIS — G4700 Insomnia, unspecified: Secondary | ICD-10-CM

## 2022-08-09 MED ORDER — DIAZEPAM 10 MG PO TABS: 5.0000 mg | ORAL_TABLET | Freq: Two times a day (BID) | ORAL | 5 refills | Status: DC | PRN

## 2022-08-09 MED ORDER — RISPERIDONE 3 MG PO TABS: 3.0000 mg | ORAL_TABLET | Freq: Every day | ORAL | 1 refills | Status: DC

## 2022-08-09 NOTE — Progress Notes (Signed)
[facility] Med Check  Patient ID: Margaret Mckenzie,  MRN: [id number]  PCP: Isaac Bliss, Rayford Halsted, MD  Date of Evaluation: 08/09/2022 Time spent:20 minutes  Chief Complaint:  Chief Complaint   Anxiety; Depression; Insomnia; Follow-up    HISTORY/CURRENT STATUS: HPI  For routine med check.  She is doing really well.  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  Enjoys it but is very tired.  She sleeps well.  Does take the trazodone at night but not every night.  And sometimes she can get away with 1/4 pill (12.5 mg) which helps.  Anxiety is very well controlled with the Valium.  She gets overwhelmed easily more so than having panic attacks.  Not having palpitations or shortness of breath.  Patient is able to enjoy things.  Energy and motivation are good.  No extreme sadness, tearfulness, or feelings of hopelessness.  ADLs and personal hygiene are normal.   Denies any changes in concentration, making decisions, or remembering things.  Appetite has not changed.  Weight is stable.  Denies suicidal or homicidal thoughts.  Patient denies increased energy with decreased need for sleep, increased talkativeness, racing thoughts, impulsivity or risky behaviors, increased spending, increased libido, grandiosity, increased irritability or anger, paranoia, or hallucinations.  Denies dizziness, syncope, seizures, numbness, tingling, tremor, tics, unsteady gait, slurred speech, confusion. Denies muscle or joint pain, stiffness, or dystonia. Denies unexplained weight loss, frequent infections, or sores that heal slowly.  No polyphagia, polydipsia, or polyuria. Denies visual changes or paresthesias.   Individual Medical History/ Review of Systems: Changes? :No      Past medications for mental health diagnoses include: Depakote, Lamictal, Prozac, Lexapro, Xanax, Sonata caused amnesia, Risperdal, Trazodone ineffective at low doses but caused dizziness at higher  doses.  Allergies: Trazodone and nefazodone and Soap  Current Medications:  Current Outpatient Medications:    Ascorbic Acid (VITAMIN C) 1000 MG tablet, Take 1,000 mg by mouth at bedtime., Disp: , Rfl:    busPIRone (BUSPAR) 15 MG tablet, TAKE 1 TABLET (15 MG TOTAL) BY MOUTH DAILY., Disp: 90 tablet, Rfl: 0   Calcium Carbonate (CALCIUM 600 PO), Take 1,200 mg by mouth at bedtime., Disp: , Rfl:    Cholecalciferol (VITAMIN D-3) 25 MCG (1000 UT) CAPS, Take 4,000 Units by mouth at bedtime., Disp: , Rfl:    cyanocobalamin (,VITAMIN B-12,) 1000 MCG/ML injection, INJECT 1 ML (1,000 MCG) INTRAMUSCULARLY EVERY 30 DAYS, Disp: 3 mL, Rfl: 1   diltiazem (CARDIZEM) 30 MG tablet, Take 1 tablet every 4 hours AS NEEDED for AFIB heart rate >100, Disp: 45 tablet, Rfl: 1   diltiazem (DILACOR XR) 180 MG 24 hr capsule, Take 1 capsule (180 mg total) by mouth daily., Disp: 90 capsule, Rfl: 3   doxycycline (VIBRAMYCIN) 100 MG capsule, Take 100 mg by mouth 2 (two) times daily., Disp: , Rfl:    ELIQUIS 5 MG TABS tablet, TAKE 1 TABLET BY MOUTH TWICE A DAY, Disp: 180 tablet, Rfl: 3   lamoTRIgine (LAMICTAL) 150 MG tablet, TAKE 2 TABLETS BY MOUTH AT BEDTIME., Disp: 180 tablet, Rfl: 0   lisinopril (ZESTRIL) 20 MG tablet, TAKE 1 TABLET BY MOUTH EVERY DAY, Disp: 90 tablet, Rfl: 0   Magnesium 400 MG CAPS, Take 400 mg by mouth at bedtime., Disp: , Rfl:    traZODone (DESYREL) 50 MG tablet, TAKE 1 TO 2 TABLETS BY MOUTH AT BEDTIME AS NEEDED FOR SLEEP, Disp: 180 tablet, Rfl: 0   diazepam (  VALIUM) 10 MG tablet, Take 0.5-1 tablets (5-10 mg total) by mouth every 12 (twelve) hours as needed for anxiety., Disp: 60 tablet, Rfl: 5   risperiDONE (RISPERDAL) 3 MG tablet, Take 1 tablet (3 mg total) by mouth at bedtime., Disp: 90 tablet, Rfl: 1 Medication Side Effects: none  Family Medical/ Social History: Changes?  No  MENTAL HEALTH EXAM:  There were no vitals taken for this visit.There is no height or weight on file to calculate BMI.   General Appearance: Casual, Neat, and Well Groomed  Eye Contact:  Good  Speech:  Clear and Coherent and Normal Rate  Volume:  Normal  Mood:  Euthymic  Affect:  Appropriate  Thought Process:  Goal Directed and Descriptions of Associations: Circumstantial  Orientation:  Full (Time, Place, and Person)  Thought Content: Logical   Suicidal Thoughts:  No  Homicidal Thoughts:  No  Memory:  WNL  Judgement:  Good  Insight:  Good  Psychomotor Activity:  Normal  Concentration:  Concentration: Good and Attention Span: Good  Recall:  Good  Fund of Knowledge: Good  Language: Good  Assets:  Desire for Improvement Financial Resources/Insurance Housing Transportation Vocational/Educational  ADL's:  Intact  Cognition: WNL  Prognosis:  Good   PCP follows her labs.  DIAGNOSES:    ICD-10-CM   1. Bipolar I disorder ([place])  F31.9     2. Generalized anxiety disorder  F41.1     3. Panic disorder  F41.0     4. Insomnia, unspecified type  G47.00       Receiving Psychotherapy: Yes   Matt Schooler  RECOMMENDATIONS:  PDMP was reviewed.  Last Valium filled 08/09/2022.  Last Ambien filled 08/03/2021. I provided 20 minutes of face to face time during this encounter, including time spent before and after the visit in records review, medical decision making, counseling pertinent to today's visit, and charting.   She is doing really well so no changes will be made.  Continue BuSpar 15 mg, 1 p.o. daily.  Continue Valium 10 mg, 1/2-1 p.o. twice daily as needed anxiety or sleep. Continue Lamictal 150 mg, 2 p.o. nightly. Continue Risperdal 3 mg, 1 p.o. nightly. Continue trazodone 50 mg, 1/2-2 p.o. nightly as needed sleep. Her PCP follows labs.  Will have drawn in November at her routine physical. Continue therapy with Elias Else.  Return in 6 months.  Donnal Moat, PA-C

## 2022-08-21 ENCOUNTER — Other Ambulatory Visit: Payer: Self-pay | Admitting: Internal Medicine

## 2022-08-21 DIAGNOSIS — Z1231 Encounter for screening mammogram for malignant neoplasm of breast: Secondary | ICD-10-CM

## 2022-09-01 ENCOUNTER — Ambulatory Visit (INDEPENDENT_AMBULATORY_CARE_PROVIDER_SITE_OTHER): Payer: PPO

## 2022-09-01 DIAGNOSIS — Z23 Encounter for immunization: Secondary | ICD-10-CM

## 2022-09-06 ENCOUNTER — Telehealth: Payer: Self-pay | Admitting: Pharmacist

## 2022-09-06 NOTE — Chronic Care Management (AMB) (Signed)
Chronic Care Management Pharmacy Assistant   Name: Margaret Mckenzie  MRN: [id number] DOB: Jan 10, 1951  Reason for Encounter: Disease State / Hypertension Assessment Call   Conditions to be addressed/monitored: HTN  Recent office visits:  None  Recent consult visits:  08/09/2022 Donnal Moat PA-C (psychiatry) - Patient was seen for bipolar 1 disorder and additional concerns. No medication changes. Follow up in 6 months.   Hospital visits:  None  Medications: Outpatient Encounter Medications as of 09/06/2022  Medication Sig   Ascorbic Acid (VITAMIN C) 1000 MG tablet Take 1,000 mg by mouth at bedtime.   busPIRone (BUSPAR) 15 MG tablet TAKE 1 TABLET (15 MG TOTAL) BY MOUTH DAILY.   Calcium Carbonate (CALCIUM 600 PO) Take 1,200 mg by mouth at bedtime.   Cholecalciferol (VITAMIN D-3) 25 MCG (1000 UT) CAPS Take 4,000 Units by mouth at bedtime.   cyanocobalamin (,VITAMIN B-12,) 1000 MCG/ML injection INJECT 1 ML (1,000 MCG) INTRAMUSCULARLY EVERY 30 DAYS   diazepam (VALIUM) 10 MG tablet Take 0.5-1 tablets (5-10 mg total) by mouth every 12 (twelve) hours as needed for anxiety.   diltiazem (CARDIZEM) 30 MG tablet Take 1 tablet every 4 hours AS NEEDED for AFIB heart rate >100   diltiazem (DILACOR XR) 180 MG 24 hr capsule Take 1 capsule (180 mg total) by mouth daily.   doxycycline (VIBRAMYCIN) 100 MG capsule Take 100 mg by mouth 2 (two) times daily.   ELIQUIS 5 MG TABS tablet TAKE 1 TABLET BY MOUTH TWICE A DAY   lamoTRIgine (LAMICTAL) 150 MG tablet TAKE 2 TABLETS BY MOUTH AT BEDTIME.   lisinopril (ZESTRIL) 20 MG tablet TAKE 1 TABLET BY MOUTH EVERY DAY   Magnesium 400 MG CAPS Take 400 mg by mouth at bedtime.   risperiDONE (RISPERDAL) 3 MG tablet Take 1 tablet (3 mg total) by mouth at bedtime.   traZODone (DESYREL) 50 MG tablet TAKE 1 TO 2 TABLETS BY MOUTH AT BEDTIME AS NEEDED FOR SLEEP   No facility-administered encounter medications on file as of 09/06/2022.  Fill History:  TRAZODONE  HYDROCHLORIDE '50MG'$  TABLET 08/02/2021 90   RISPERIDONE 3 MG TABLET 07/17/2022 90   LISINOPRIL 20 MG TABLET 07/28/2022 90   LAMOTRIGINE 150 MG TABLET 07/28/2022 90   DOXYCYCLINE HYCLATE 100 MG CAP 03/02/2022 15   DILT XR 180 MG CAPSULE 08/28/2022 90   DIAZEPAM 10 MG TABLET 08/13/2022 30   CYANOCOBALAMIN 1,000 MCG/ML VL 08/05/2022 84   BUSPIRONE HCL 15 MG TABLET 07/28/2022 90   ELIQUIS 5 MG TABLET 07/29/2022 90   Reviewed chart prior to disease state call. Spoke with patient regarding BP  Recent Office Vitals: BP Readings from Last 3 Encounters:  05/09/22 121/84  04/26/22 132/78  12/13/21 130/72   Pulse Readings from Last 3 Encounters:  05/09/22 98  04/26/22 84  12/13/21 100    Wt Readings from Last 3 Encounters:  05/09/22 177 lb 3.2 oz (80.4 kg)  04/26/22 176 lb 6.4 oz (80 kg)  12/13/21 172 lb 12.8 oz (78.4 kg)     Kidney Function Lab Results  Component Value Date/Time   CREATININE 0.67 04/26/2022 01:44 PM   CREATININE 0.72 07/29/2021 01:17 PM   GFR 88.83 04/26/2022 01:44 PM   GFRNONAA >60 07/16/2021 11:02 AM   GFRAA 99 09/08/2019 09:16 AM       Latest Ref Rng & Units 04/26/2022    1:44 PM 07/29/2021    1:17 PM 07/16/2021   11:02 AM  BMP  Glucose 70 - 99  mg/dL 76  94  113   BUN 6 - 23 mg/dL '9  11  11   '$ Creatinine 0.40 - 1.20 mg/dL 0.67  0.72  0.50   Sodium 135 - 145 mEq/L 134  137  129   Potassium 3.5 - 5.1 mEq/L 3.9  3.9  4.6   Chloride 96 - 112 mEq/L 96  101  96   CO2 19 - 32 mEq/L '27  29  25   '$ Calcium 8.4 - 10.5 mg/dL 9.8  10.2  9.7     Current antihypertensive regimen:  Cardizem 30 mg 1 q 4 hrs prn Afib Dilacor CR 180 mg daily Lisinopril 20 mg daily  How often are you checking your Blood Pressure? Patient has not checked blood pressures in the past two weeks, prior to this she was checking every week.   Current home BP readings: 140/89, 140/87, 139/88, 142/92, 132/81 and 127/78.  What recent interventions/DTPs have been made by any provider to  improve Blood Pressure control since last CPP Visit: No recent interventions  Any recent hospitalizations or ED visits since last visit with CPP? No recent hospital visits.  What diet changes have been made to improve Blood Pressure Control?  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 Lunch - patient will have a meat, vegetable and sometimes a starch Dinner - patient doesn't eat dinner  What exercise is being done to improve your Blood Pressure Control?  Patient walks 3 miles daily and other various exercises.  Adherence Review: Is the patient currently on ACE/ARB medication? Yes Does the patient have >5 day gap between last estimated fill dates? No  Care Gaps: AWV - 11/09/21 message to Ramond Craver Last BP - 121/84 on 05/09/2022 Covid - overdue  Star Rating Drugs: Lisinopril 20 mg - last filled 07/28/2022 90 DS at [facility] Pharmacist Assistant [phone]

## 2022-09-07 ENCOUNTER — Other Ambulatory Visit: Payer: Self-pay | Admitting: Physician Assistant

## 2022-09-15 ENCOUNTER — Ambulatory Visit
Admission: RE | Admit: 2022-09-15 | Discharge: 2022-09-15 | Disposition: A | Discharge status: Home / Self Care | Payer: PPO | Source: Ambulatory Visit

## 2022-09-15 DIAGNOSIS — Z1231 Encounter for screening mammogram for malignant neoplasm of breast: Secondary | ICD-10-CM

## 2022-09-19 ENCOUNTER — Other Ambulatory Visit: Payer: Self-pay | Admitting: Internal Medicine

## 2022-09-19 DIAGNOSIS — R928 Other abnormal and inconclusive findings on diagnostic imaging of breast: Secondary | ICD-10-CM

## 2022-09-26 ENCOUNTER — Ambulatory Visit
Admission: RE | Admit: 2022-09-26 | Discharge: 2022-09-26 | Disposition: A | Discharge status: Home / Self Care | Payer: PPO | Source: Ambulatory Visit | Attending: Internal Medicine | Admitting: Internal Medicine

## 2022-09-26 ENCOUNTER — Other Ambulatory Visit: Payer: Self-pay | Admitting: Internal Medicine

## 2022-09-26 DIAGNOSIS — R928 Other abnormal and inconclusive findings on diagnostic imaging of breast: Secondary | ICD-10-CM

## 2022-09-26 DIAGNOSIS — N6320 Unspecified lump in the left breast, unspecified quadrant: Secondary | ICD-10-CM

## 2022-09-26 DIAGNOSIS — N6321 Unspecified lump in the left breast, upper outer quadrant: Secondary | ICD-10-CM | POA: Diagnosis not present

## 2022-09-29 ENCOUNTER — Ambulatory Visit
Admission: RE | Admit: 2022-09-29 | Discharge: 2022-09-29 | Disposition: A | Discharge status: Home / Self Care | Payer: PPO | Source: Ambulatory Visit | Attending: Internal Medicine | Admitting: Internal Medicine

## 2022-09-29 DIAGNOSIS — N6320 Unspecified lump in the left breast, unspecified quadrant: Secondary | ICD-10-CM

## 2022-09-29 DIAGNOSIS — N6321 Unspecified lump in the left breast, upper outer quadrant: Secondary | ICD-10-CM | POA: Diagnosis not present

## 2022-09-29 DIAGNOSIS — C50412 Malignant neoplasm of upper-outer quadrant of left female breast: Secondary | ICD-10-CM | POA: Diagnosis not present

## 2022-10-06 ENCOUNTER — Telehealth: Payer: Self-pay | Admitting: *Deleted

## 2022-10-06 NOTE — Telephone Encounter (Signed)
Patient assistance application faxed to the company

## 2022-10-09 ENCOUNTER — Ambulatory Visit (INDEPENDENT_AMBULATORY_CARE_PROVIDER_SITE_OTHER): Payer: PPO | Admitting: Internal Medicine

## 2022-10-09 ENCOUNTER — Encounter: Payer: Self-pay | Admitting: *Deleted

## 2022-10-09 ENCOUNTER — Encounter: Payer: Self-pay | Admitting: Internal Medicine

## 2022-10-09 VITALS — BP 130/84 | HR 105 | Temp 98.8°F | Wt 177.5 lb

## 2022-10-09 DIAGNOSIS — M545 Low back pain, unspecified: Secondary | ICD-10-CM | POA: Diagnosis not present

## 2022-10-09 DIAGNOSIS — Z170 Estrogen receptor positive status [ER+]: Secondary | ICD-10-CM

## 2022-10-09 DIAGNOSIS — Z23 Encounter for immunization: Secondary | ICD-10-CM | POA: Diagnosis not present

## 2022-10-09 DIAGNOSIS — G8929 Other chronic pain: Secondary | ICD-10-CM | POA: Diagnosis not present

## 2022-10-09 NOTE — Progress Notes (Signed)
Established Patient Office Visit     CC/Reason for Visit: Back pain  HPI: Margaret Mckenzie is a 71 y.o. female who is coming in today for the above mentioned reasons.  She works as a Educational psychologist.  For some weeks now she has been having bilateral lower back pain without radiation into the legs.  No bowel or bladder incontinence, no saddle anesthesia.  She has been doing back stretches with only intermittent relief.  She is requesting a flu vaccine today.   Past Medical/Surgical History: Past Medical History:  Diagnosis Date   Anxiety    Bipolar 1 disorder ([place])    Chicken pox    Depression    Hypertension    high blood pressure readings    Kidney disease    kidney stones and saw urologist, Dr. Edward Qualia for lithotripsy remotely    PAF (paroxysmal atrial fibrillation) [facility])     Past Surgical History:  Procedure Laterality Date   BUNIONECTOMY  2007   [place]    Social History:  reports that she quit smoking about 5 years ago. Her smoking use included cigarettes. She smoked an average of .5 packs per day. She has never used smokeless tobacco. She reports current alcohol use of about 7.0 standard drinks of alcohol per week. She reports that she does not use drugs.  Allergies: Allergies  Allergen Reactions   Trazodone And Nefazodone Other (See Comments)    Trazodone causes lethargy the morning after taking it   Soap Rash    Patients states soap at work drys out hands and has to use special cream to sooth skin.    Family History:  Family History  Problem Relation Age of Onset   Osteoporosis Mother    Stroke Mother        TIA, stoke   Mental retardation Mother    Hypertension Father    Heart disease Father 40   Stroke Father 11   Mental illness Sister    Breast cancer Neg Hx      Current Outpatient Medications:    Ascorbic Acid (VITAMIN C) 1000 MG tablet, Take 1,000 mg by mouth at bedtime., Disp: , Rfl:    busPIRone (BUSPAR)  15 MG tablet, TAKE 1 TABLET (15 MG TOTAL) BY MOUTH DAILY., Disp: 90 tablet, Rfl: 0   Calcium Carbonate (CALCIUM 600 PO), Take 1,200 mg by mouth at bedtime., Disp: , Rfl:    Cholecalciferol (VITAMIN D-3) 25 MCG (1000 UT) CAPS, Take 4,000 Units by mouth at bedtime., Disp: , Rfl:    cyanocobalamin (,VITAMIN B-12,) 1000 MCG/ML injection, INJECT 1 ML (1,000 MCG) INTRAMUSCULARLY EVERY 30 DAYS, Disp: 3 mL, Rfl: 1   diazepam (VALIUM) 10 MG tablet, Take 0.5-1 tablets (5-10 mg total) by mouth every 12 (twelve) hours as needed for anxiety., Disp: 60 tablet, Rfl: 5   diltiazem (CARDIZEM) 30 MG tablet, Take 1 tablet every 4 hours AS NEEDED for AFIB heart rate >100, Disp: 45 tablet, Rfl: 1   diltiazem (DILACOR XR) 180 MG 24 hr capsule, Take 1 capsule (180 mg total) by mouth daily., Disp: 90 capsule, Rfl: 3   doxycycline (VIBRAMYCIN) 100 MG capsule, Take 100 mg by mouth 2 (two) times daily., Disp: , Rfl:    ELIQUIS 5 MG TABS tablet, TAKE 1 TABLET BY MOUTH TWICE A DAY, Disp: 180 tablet, Rfl: 3   lamoTRIgine (LAMICTAL) 150 MG tablet, TAKE 2 TABLETS BY MOUTH AT BEDTIME., Disp: 180 tablet, Rfl: 0  lisinopril (ZESTRIL) 20 MG tablet, TAKE 1 TABLET BY MOUTH EVERY DAY, Disp: 90 tablet, Rfl: 0   Magnesium 400 MG CAPS, Take 400 mg by mouth at bedtime., Disp: , Rfl:    risperiDONE (RISPERDAL) 3 MG tablet, Take 1 tablet (3 mg total) by mouth at bedtime., Disp: 90 tablet, Rfl: 1   traZODone (DESYREL) 50 MG tablet, TAKE 1 TO 2 TABLETS BY MOUTH AT BEDTIME AS NEEDED FOR SLEEP, Disp: 180 tablet, Rfl: 0  Review of Systems:  Constitutional: Denies fever, chills, diaphoresis, appetite change and fatigue.  HEENT: Denies photophobia, eye pain, redness, hearing loss, ear pain, congestion, sore throat, rhinorrhea, sneezing, mouth sores, trouble swallowing, neck pain, neck stiffness and tinnitus.   Respiratory: Denies SOB, DOE, cough, chest tightness,  and wheezing.   Cardiovascular: Denies chest pain, palpitations and leg swelling.   Gastrointestinal: Denies nausea, vomiting, abdominal pain, diarrhea, constipation, blood in stool and abdominal distention.  Genitourinary: Denies dysuria, urgency, frequency, hematuria, flank pain and difficulty urinating.  Endocrine: Denies: hot or cold intolerance, sweats, changes in hair or nails, polyuria, polydipsia. Musculoskeletal: Denies myalgias,  joint swelling, arthralgias and gait problem.  Skin: Denies pallor, rash and wound.  Neurological: Denies dizziness, seizures, syncope, weakness, light-headedness, numbness and headaches.  Hematological: Denies adenopathy. Easy bruising, personal or family bleeding history  Psychiatric/Behavioral: Denies suicidal ideation, mood changes, confusion, nervousness, sleep disturbance and agitation    Physical Exam: Vitals:   10/09/22 1444  BP: 130/84  Pulse: (!) 105  Temp: 98.8 F (37.1 C)  TempSrc: Oral  SpO2: 98%  Weight: 177 lb 8 oz (80.5 kg)    Body mass index is 30.47 kg/m.   Constitutional: NAD, calm, comfortable Eyes: PERRL, lids and conjunctivae normal ENMT: Mucous membranes are moist.  Respiratory: clear to auscultation bilaterally, no wheezing, no crackles. Normal respiratory effort. No accessory muscle use.  Cardiovascular: Regular rate and rhythm, no murmurs / rubs / gallops. No extremity edema.   Impression and Plan:  Chronic bilateral low back pain without sciatica - Plan: Ambulatory referral to Physical Therapy  Need for influenza vaccination  -Suspect this is simply musculoskeletal pain given lack of red flag signs/symptoms. -Advised icing, as needed NSAIDs, back stretches, local massage therapy. -Will also send in PT referral as she has been doing back stretches already. -Flu vaccine administered in office today.   Time spent:23 minutes reviewing chart, interviewing and examining patient and formulating plan of care.       [name], MD [facility]

## 2022-10-11 ENCOUNTER — Inpatient Hospital Stay: Payer: PPO

## 2022-10-11 ENCOUNTER — Ambulatory Visit: Payer: PPO | Admitting: Physical Therapy

## 2022-10-11 ENCOUNTER — Encounter: Payer: Self-pay | Admitting: *Deleted

## 2022-10-11 ENCOUNTER — Ambulatory Visit: Payer: Self-pay | Admitting: Surgery

## 2022-10-11 ENCOUNTER — Other Ambulatory Visit: Payer: Self-pay

## 2022-10-11 ENCOUNTER — Telehealth: Payer: Self-pay | Admitting: Genetic Counselor

## 2022-10-11 ENCOUNTER — Ambulatory Visit
Admission: RE | Admit: 2022-10-11 | Discharge: 2022-10-11 | Disposition: A | Discharge status: Home / Self Care | Payer: PPO | Source: Ambulatory Visit | Attending: Radiation Oncology | Admitting: Radiation Oncology

## 2022-10-11 ENCOUNTER — Encounter: Payer: Self-pay | Admitting: Hematology

## 2022-10-11 ENCOUNTER — Inpatient Hospital Stay: Payer: PPO | Attending: Hematology | Admitting: Hematology

## 2022-10-11 VITALS — BP 148/85 | HR 102 | Temp 97.7°F | Resp 18 | Ht 64.0 in | Wt 178.2 lb

## 2022-10-11 DIAGNOSIS — Z170 Estrogen receptor positive status [ER+]: Secondary | ICD-10-CM

## 2022-10-11 DIAGNOSIS — Z87891 Personal history of nicotine dependence: Secondary | ICD-10-CM | POA: Insufficient documentation

## 2022-10-11 DIAGNOSIS — C50912 Malignant neoplasm of unspecified site of left female breast: Secondary | ICD-10-CM

## 2022-10-11 DIAGNOSIS — Z7289 Other problems related to lifestyle: Secondary | ICD-10-CM | POA: Diagnosis not present

## 2022-10-11 DIAGNOSIS — Z87442 Personal history of urinary calculi: Secondary | ICD-10-CM | POA: Insufficient documentation

## 2022-10-11 DIAGNOSIS — Z823 Family history of stroke: Secondary | ICD-10-CM | POA: Insufficient documentation

## 2022-10-11 DIAGNOSIS — Z5986 Financial insecurity: Secondary | ICD-10-CM | POA: Diagnosis not present

## 2022-10-11 DIAGNOSIS — I480 Paroxysmal atrial fibrillation: Secondary | ICD-10-CM | POA: Insufficient documentation

## 2022-10-11 DIAGNOSIS — F419 Anxiety disorder, unspecified: Secondary | ICD-10-CM | POA: Diagnosis not present

## 2022-10-11 DIAGNOSIS — Z818 Family history of other mental and behavioral disorders: Secondary | ICD-10-CM | POA: Insufficient documentation

## 2022-10-11 DIAGNOSIS — Z8262 Family history of osteoporosis: Secondary | ICD-10-CM | POA: Diagnosis not present

## 2022-10-11 DIAGNOSIS — I10 Essential (primary) hypertension: Secondary | ICD-10-CM | POA: Diagnosis not present

## 2022-10-11 DIAGNOSIS — Z8249 Family history of ischemic heart disease and other diseases of the circulatory system: Secondary | ICD-10-CM | POA: Diagnosis not present

## 2022-10-11 DIAGNOSIS — Z79899 Other long term (current) drug therapy: Secondary | ICD-10-CM | POA: Diagnosis not present

## 2022-10-11 DIAGNOSIS — Z7901 Long term (current) use of anticoagulants: Secondary | ICD-10-CM | POA: Diagnosis not present

## 2022-10-11 DIAGNOSIS — C50412 Malignant neoplasm of upper-outer quadrant of left female breast: Secondary | ICD-10-CM | POA: Insufficient documentation

## 2022-10-11 DIAGNOSIS — M85851 Other specified disorders of bone density and structure, right thigh: Secondary | ICD-10-CM | POA: Insufficient documentation

## 2022-10-11 LAB — CMP (CANCER CENTER ONLY)
ALT: 45 U/L — ABNORMAL HIGH (ref 0–44)
AST: 46 U/L — ABNORMAL HIGH (ref 15–41)
Albumin: 4.4 g/dL (ref 3.5–5.0)
Alkaline Phosphatase: 90 U/L (ref 38–126)
Anion gap: 8 (ref 5–15)
BUN: 9 mg/dL (ref 8–23)
CO2: 27 mmol/L (ref 22–32)
Calcium: 9.5 mg/dL (ref 8.9–10.3)
Chloride: 93 mmol/L — ABNORMAL LOW (ref 98–111)
Creatinine: 0.7 mg/dL (ref 0.44–1.00)
GFR, Estimated: >60 mL/min (ref >60)
Glucose, Bld: 89 mg/dL (ref 70–99)
Potassium: 4.2 mmol/L (ref 3.5–5.1)
Sodium: 128 mmol/L — ABNORMAL LOW (ref 135–145)
Total Bilirubin: 0.7 mg/dL (ref 0.3–1.2)
Total Protein: 7.1 g/dL (ref 6.5–8.1)

## 2022-10-11 LAB — CBC WITH DIFFERENTIAL (CANCER CENTER ONLY)
Abs Immature Granulocytes: 0.04 10*3/uL (ref 0.00–0.07)
Basophils Absolute: 0 10*3/uL (ref 0.0–0.1)
Basophils Relative: 1 %
Eosinophils Absolute: 0 10*3/uL (ref 0.0–0.5)
Eosinophils Relative: 0 %
HCT: 36.1 % (ref 36.0–46.0)
Hemoglobin: 12.2 g/dL (ref 12.0–15.0)
Immature Granulocytes: 1 %
Lymphocytes Relative: 10 %
Lymphs Abs: 0.7 10*3/uL (ref 0.7–4.0)
MCH: 32.9 pg (ref 26.0–34.0)
MCHC: 33.8 g/dL (ref 30.0–36.0)
MCV: 97.3 fL (ref 80.0–100.0)
Monocytes Absolute: 0.8 10*3/uL (ref 0.1–1.0)
Monocytes Relative: 10 %
Neutro Abs: 5.9 10*3/uL (ref 1.7–7.7)
Neutrophils Relative %: 78 %
Platelet Count: 221 10*3/uL (ref 150–400)
RBC: 3.71 MIL/uL — ABNORMAL LOW (ref 3.87–5.11)
RDW: 13.5 % (ref 11.5–15.5)
WBC Count: 7.5 10*3/uL (ref 4.0–10.5)
nRBC: 0 % (ref 0.0–0.2)

## 2022-10-11 LAB — GENETIC SCREENING ORDER

## 2022-10-11 NOTE — Progress Notes (Signed)
[facility] Psychosocial Distress Screening Spiritual Care  Met with Margaret Mckenzie in [facility] to introduce [place] team/resources, reviewing distress screen per protocol.  The patient scored a 1 on the Psychosocial Distress Thermometer which indicates mild distress. Also assessed for distress and other psychosocial needs.      10/11/2022    3:33 PM  ONCBCN DISTRESS SCREENING  Distress experienced in past week (1-10) 1  Emotional problem type Depression;Nervousness/Anxiety;Adjusting to illness  Information Concerns Type Lack of info about diagnosis;Lack of info about treatment;Lack of info about complementary therapy choices;Lack of info about maintaining fitness  Referral to support programs Yes   Lysle Morales, counseling intern, met with patient in clinic. Patient reported they were feeling better than earlier today. The patient reported when they entered their appointment they were a 10 on the distress screening but now they are a 1. The patient reported they are going to go home and rest by listening to podcasts. They patient reported they like to listen to podcasts on finance, law, and food.   Follow up needed: No.  Lysle Morales,  Counseling Intern  [phone] [email or web address]

## 2022-10-11 NOTE — Telephone Encounter (Signed)
Margaret Mckenzie was seen by a genetic counselor during the breast multidisciplinary clinic on 10/11/2022. In addition to her personal history of breast cancer, she reported no family history of cancer. She does not meet NCCN criteria for genetic testing at this time. She was still offered genetic counseling and testing but declined. We encourage her to contact us if there are any changes to her personal or family history of cancer. If she meets NCCN criteria based on the updated personal/family history, she would be recommended to have genetic counseling and testing.   Margaret Passy, MS, [facility] Genetic Counselor [email or web address] (P) [phone]

## 2022-10-11 NOTE — Progress Notes (Signed)
Radiation Oncology         [phone]) [phone] ________________________________  Name: Margaret Mckenzie        MRN: [id number]  Date of Service: 10/11/2022 DOB: [birth year]  CC:Isaac Bliss, Rayford Halsted, MD  Erroll Luna, MD     REFERRING PHYSICIAN: Erroll Luna, MD   DIAGNOSIS: The encounter diagnosis was Malignant neoplasm of upper-outer quadrant of left breast in female, estrogen receptor positive ([place]).   HISTORY OF PRESENT ILLNESS: Margaret Mckenzie is a 71 y.o. female seen in the multidisciplinary breast clinic for a new diagnosis of left breast cancer. The patient was noted to have a mass in the breast at 2:00 position measuring 9 mm. Her axilla was negative for adenopathy. 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 She is seen today to discuss treatment recommendations of cancer.     PREVIOUS RADIATION THERAPY: No   PAST MEDICAL HISTORY:  Past Medical History:  Diagnosis Date   Anxiety    Bipolar 1 disorder ([place])    Chicken pox    Depression    Hypertension    high blood pressure readings    Kidney disease    kidney stones and saw urologist, Dr. Edward Qualia for lithotripsy remotely    PAF (paroxysmal atrial fibrillation) ([place])        PAST SURGICAL HISTORY: Past Surgical History:  Procedure Laterality Date   BUNIONECTOMY  2007   TONSILLECTOMY AND ADENOIDECTOMY  1975     FAMILY HISTORY:  Family History  Problem Relation Age of Onset   Osteoporosis Mother    Stroke Mother        TIA, stoke   Mental retardation Mother    Hypertension Father    Heart disease Father 25   Stroke Father 32   Mental illness Sister    Breast cancer Neg Hx      SOCIAL HISTORY:  reports that she quit smoking about 5 years ago. Her smoking use included cigarettes. She has a 20.00 pack-year smoking history. She has never used smokeless tobacco. She reports current alcohol use of about 7.0 standard  drinks of alcohol per week. She reports that she does not use drugs. The patient is single and lives in [place]. She's close with her sister and enjoys weekly visits with her. She works at [employer] early in the morning til about noon.    ALLERGIES: Trazodone and nefazodone and Soap   MEDICATIONS:  Current Outpatient Medications  Medication Sig Dispense Refill   Ascorbic Acid (VITAMIN C) 1000 MG tablet Take 1,000 mg by mouth at bedtime.     busPIRone (BUSPAR) 15 MG tablet TAKE 1 TABLET (15 MG TOTAL) BY MOUTH DAILY. 90 tablet 0   Calcium Carbonate (CALCIUM 600 PO) Take 1,200 mg by mouth at bedtime.     Cholecalciferol (VITAMIN D-3) 25 MCG (1000 UT) CAPS Take 4,000 Units by mouth at bedtime.     cyanocobalamin (,VITAMIN B-12,) 1000 MCG/ML injection INJECT 1 ML (1,000 MCG) INTRAMUSCULARLY EVERY 30 DAYS 3 mL 1   diazepam (VALIUM) 10 MG tablet Take 0.5-1 tablets (5-10 mg total) by mouth every 12 (twelve) hours as needed for anxiety. 60 tablet 5   diltiazem (CARDIZEM) 30 MG tablet Take 1 tablet every 4 hours AS NEEDED for AFIB heart rate >100 45 tablet 1   diltiazem (DILACOR XR) 180 MG 24 hr capsule Take 1 capsule (180 mg total) by mouth daily.  90 capsule 3   ELIQUIS 5 MG TABS tablet TAKE 1 TABLET BY MOUTH TWICE A DAY 180 tablet 3   lamoTRIgine (LAMICTAL) 150 MG tablet TAKE 2 TABLETS BY MOUTH AT BEDTIME. 180 tablet 0   lisinopril (ZESTRIL) 20 MG tablet TAKE 1 TABLET BY MOUTH EVERY DAY 90 tablet 0   Magnesium 400 MG CAPS Take 400 mg by mouth at bedtime.     risperiDONE (RISPERDAL) 3 MG tablet Take 1 tablet (3 mg total) by mouth at bedtime. 90 tablet 1   traZODone (DESYREL) 50 MG tablet TAKE 1 TO 2 TABLETS BY MOUTH AT BEDTIME AS NEEDED FOR SLEEP 180 tablet 0   No current facility-administered medications for this encounter.     REVIEW OF SYSTEMS: On review of systems, the patient reports that she is doing well overall but has had significant bruising of her left breast since her  biopsy.      PHYSICAL EXAM:  Wt Readings from Last 3 Encounters:  10/11/22 178 lb 3.2 oz (80.8 kg)  10/09/22 177 lb 8 oz (80.5 kg)  05/09/22 177 lb 3.2 oz (80.4 kg)   Temp Readings from Last 3 Encounters:  10/11/22 97.7 F (36.5 C) (Temporal)  10/09/22 98.8 F (37.1 C) (Oral)  04/26/22 97.6 F (36.4 C) (Oral)   BP Readings from Last 3 Encounters:  10/11/22 (!) 148/85  10/09/22 130/84  05/09/22 121/84   Pulse Readings from Last 3 Encounters:  10/11/22 (!) 102  10/09/22 (!) 105  05/09/22 98    In general this is a well appearing caucasian female in no acute distress. She's alert and oriented x4 and appropriate throughout the examination. Cardiopulmonary assessment is negative for acute distress and she exhibits normal effort. The left breast has bruising over the majority of the upper outer and lower outer quadrants.    ECOG = 1  0 - Asymptomatic (Fully active, able to carry on all predisease activities without restriction)  1 - Symptomatic but completely ambulatory (Restricted in physically strenuous activity but ambulatory and able to carry out work of a light or sedentary nature. For example, light housework, office work)  2 - Symptomatic, <50% in bed during the day (Ambulatory and capable of all self care but unable to carry out any work activities. Up and about more than 50% of waking hours)  3 - Symptomatic, >50% in bed, but not bedbound (Capable of only limited self-care, confined to bed or chair 50% or more of waking hours)  4 - Bedbound (Completely disabled. Cannot carry on any self-care. Totally confined to bed or chair)  5 - Death   Eustace Pen MM, Creech RH, Tormey DC, et al. [phone]). "Toxicity and response criteria of the [facility]". [place] Oncol. 5 (6): 649-55    LABORATORY DATA:  Lab Results  Component Value Date   WBC 7.5 10/11/2022   HGB 12.2 10/11/2022   HCT 36.1 10/11/2022   MCV 97.3 10/11/2022   PLT 221 10/11/2022    Lab Results  Component Value Date   NA 128 (L) 10/11/2022   K 4.2 10/11/2022   CL 93 (L) 10/11/2022   CO2 27 10/11/2022   Lab Results  Component Value Date   ALT 45 (H) 10/11/2022   AST 46 (H) 10/11/2022   ALKPHOS 90 10/11/2022   BILITOT 0.7 10/11/2022      RADIOGRAPHY: [place] LT BREAST BX W LOC DEV 1ST LESION IMG BX SPEC US GUIDE  Addendum Date: 10/02/2022   ADDENDUM  REPORT: 10/02/2022 15:53 ADDENDUM: Pathology revealed GRADE 2 INVASIVE DUCTAL CARCINOMA of the LEFT breast, 2 o'clock, 7 cmfn (ribbon clip). This was found to be concordant by Dr. Franki Cabot. Pathology results were discussed with the patient by telephone. The patient reported doing well after the biopsy with tenderness at the site. Post biopsy instructions and care were reviewed and questions were answered. The patient was encouraged to call [facility] for any additional concerns. The patient was referred to [facility] on October 11, 2022. Pathology results reported by Stacie Acres RN on 10/02/2022. Electronically Signed   By: Franki Cabot M.D.   On: 10/02/2022 15:53   Result Date: 10/02/2022 CLINICAL DATA:  Patient presents today for ultrasound-guided biopsy of a LEFT breast mass at the 2 o'clock axis. EXAM: ULTRASOUND GUIDED LEFT BREAST CORE NEEDLE BIOPSY COMPARISON:  Previous exams including LEFT breast diagnostic mammogram and ultrasound dated 09/26/2022. PROCEDURE: I met with the patient and we discussed the procedure of ultrasound-guided biopsy, including benefits and alternatives. We discussed the high likelihood of a successful procedure. We discussed the risks of the procedure, including infection, bleeding, tissue injury, clip migration, and inadequate sampling. Informed written consent was given. The usual time-out protocol was performed immediately prior to the procedure. Lesion quadrant: Upper outer quadrant Using  sterile technique and 1% Lidocaine as local anesthetic, under direct ultrasound visualization, a 12 gauge spring-loaded device was used to perform biopsy of the LEFT breast mass at the 2 o'clock axis using a lateral approach. At the conclusion of the procedure , a ribbon shaped tissue marker clip was deployed into the biopsy cavity. Follow up 2 view mammogram was performed and dictated separately. IMPRESSION: Ultrasound guided biopsy of the LEFT breast mass at the 2 o'clock axis. No apparent complications. Electronically Signed: By: Franki Cabot M.D. On: 09/29/2022 14:24  MM CLIP PLACEMENT LEFT  Result Date: 09/29/2022 CLINICAL DATA:  Status post ultrasound-guided biopsy for a LEFT breast mass. EXAM: 3D DIAGNOSTIC LEFT MAMMOGRAM POST ULTRASOUND BIOPSY COMPARISON:  Previous exam(s). FINDINGS: 3D Mammographic images were obtained following ultrasound guided biopsy of the LEFT breast mass at the 2 o'clock axis. The biopsy marking clip is in expected position at the site of biopsy. IMPRESSION: Appropriate positioning of the ribbon shaped biopsy marking clip at the site of biopsy in the upper-outer quadrant of the LEFT breast. Moderate-sized post biopsy hematoma noted. Hemostasis obtained with manual compression. Compression/pressure bandages applied prior to discharge. Final Assessment: Post Procedure Mammograms for Marker Placement Electronically Signed   By: Franki Cabot M.D.   On: 09/29/2022 14:54  MM DIAG BREAST TOMO UNI LEFT  Result Date: 09/26/2022 CLINICAL DATA:  71 year old female presenting as a recall from screening for possible left breast mass. EXAM: DIGITAL DIAGNOSTIC UNILATERAL LEFT MAMMOGRAM WITH TOMOSYNTHESIS; ULTRASOUND LEFT BREAST LIMITED TECHNIQUE: Left digital diagnostic mammography and breast tomosynthesis was performed.; Targeted ultrasound examination of the left breast was performed. COMPARISON:  Previous exam(s). ACR Breast Density Category b: There are scattered areas of  fibroglandular density. FINDINGS: Mammogram: Left breast: Spot compression tomosynthesis views of the left breast were performed demonstrating persistence of a small irregular mass in the upper outer left breast measuring approximately 0.5 cm. Ultrasound: Targeted ultrasound is performed in the left breast at 2 o'clock 7 cm from the nipple demonstrating a hyperechoic mass measuring 0.9 x 0.5 x 0.7 cm. This corresponds to the mammographic finding. Targeted ultrasound of the left  axilla demonstrates normal lymph nodes. IMPRESSION: Suspicious mass measuring 0.9 cm in the left breast at 2 o'clock. RECOMMENDATION: Ultrasound-guided core needle biopsy x1 of the left breast. I have discussed the findings and recommendations with the patient who agrees to proceed with biopsy. The patient will be scheduled for the biopsy appointment prior to leaving the office today. BI-RADS CATEGORY  4: Suspicious. Electronically Signed   By: Audie Pinto M.D.   On: 09/26/2022 15:36  US BREAST LTD UNI LEFT INC AXILLA  Result Date: 09/26/2022 CLINICAL DATA:  71 year old female presenting as a recall from screening for possible left breast mass. EXAM: DIGITAL DIAGNOSTIC UNILATERAL LEFT MAMMOGRAM WITH TOMOSYNTHESIS; ULTRASOUND LEFT BREAST LIMITED TECHNIQUE: Left digital diagnostic mammography and breast tomosynthesis was performed.; Targeted ultrasound examination of the left breast was performed. COMPARISON:  Previous exam(s). ACR Breast Density Category b: There are scattered areas of fibroglandular density. FINDINGS: Mammogram: Left breast: Spot compression tomosynthesis views of the left breast were performed demonstrating persistence of a small irregular mass in the upper outer left breast measuring approximately 0.5 cm. Ultrasound: Targeted ultrasound is performed in the left breast at 2 o'clock 7 cm from the nipple demonstrating a hyperechoic mass measuring 0.9 x 0.5 x 0.7 cm. This corresponds to the mammographic finding.  Targeted ultrasound of the left axilla demonstrates normal lymph nodes. IMPRESSION: Suspicious mass measuring 0.9 cm in the left breast at 2 o'clock. RECOMMENDATION: Ultrasound-guided core needle biopsy x1 of the left breast. I have discussed the findings and recommendations with the patient who agrees to proceed with biopsy. The patient will be scheduled for the biopsy appointment prior to leaving the office today. BI-RADS CATEGORY  4: Suspicious. Electronically Signed   By: Audie Pinto M.D.   On: 09/26/2022 15:36  MM 3D SCREEN BREAST BILATERAL  Result Date: 09/18/2022 CLINICAL DATA:  Screening. EXAM: DIGITAL SCREENING BILATERAL MAMMOGRAM WITH TOMOSYNTHESIS AND CAD TECHNIQUE: Bilateral screening digital craniocaudal and mediolateral oblique mammograms were obtained. Bilateral screening digital breast tomosynthesis was performed. The images were evaluated with computer-aided detection. COMPARISON:  Previous exam(s). ACR Breast Density Category b: There are scattered areas of fibroglandular density. FINDINGS: In the left breast, a possible mass warrants further evaluation. In the right breast, no findings suspicious for malignancy. IMPRESSION: Further evaluation is suggested for a possible mass in the left breast. RECOMMENDATION: Diagnostic mammogram and possibly ultrasound of the left breast. (Code:[id number]) The patient will be contacted regarding the findings, and additional imaging will be scheduled. BI-RADS CATEGORY  0: Incomplete. Need additional imaging evaluation and/or prior mammograms for comparison. Electronically Signed   By: Audie Pinto M.D.   On: 09/18/2022 12:08       IMPRESSION/PLAN: 1. 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 Dr. Lisbeth Renshaw discusses the pathology findings and reviews the nature of early stage left breast disease. The consensus from the breast conference includes breast conservation with lumpectomy with sentinel node  biopsy. Depending on the size of the final tumor measurements rendered by pathology, the tumor may be tested for Oncotype Dx score to determine a role for systemic therapy. Provided that chemotherapy is not indicated, the patient's course would then be followed by external radiotherapy to the breast  to reduce risks of local recurrence followed by antiestrogen therapy. We discussed the risks, benefits, short, and long term effects of radiotherapy, as well as the curative intent, and the patient is interested in proceeding. Dr. Lisbeth Renshaw discusses the delivery and logistics of radiotherapy and anticipates  a course of 4 or up to 6 1/2 weeks of radiotherapy to the left breast with deep inspiration breath hold technique. We will see her back a few weeks after surgery to discuss the simulation process and anticipate we starting radiotherapy about 4-6 weeks after surgery.    In a visit lasting 60 minutes, greater than 50% of the time was spent face to face reviewing her case, as well as in preparation of, discussing, and coordinating the patient's care.  The above documentation reflects my direct findings during this shared patient visit. Please see the separate note by Dr. Lisbeth Renshaw on this date for the remainder of the patient's plan of care.    Carola Rhine, [facility]    **Disclaimer: This note was dictated with voice recognition software. Similar sounding words can inadvertently be transcribed and this note may contain transcription errors which may not have been corrected upon publication of note.**

## 2022-10-11 NOTE — Progress Notes (Signed)
[place]   [phone] [phone] [place] Note   Patient Care Team: Isaac Bliss, Rayford Halsted, MD as PCP - General (Internal Medicine) Minus Breeding, MD as PCP - Cardiology (Cardiology) Group, [facility] ([place]) Viona Gilmore, [facility] as Pharmacist (Pharmacist) Mauro Kaufmann, RN as Oncology Nurse Navigator Rockwell Germany, RN as Oncology Nurse Navigator Truitt Merle, MD as Consulting Physician (Hematology) Erroll Luna, MD as Consulting Physician (General Surgery) Kyung Rudd, MD as Consulting Physician (Radiation Oncology)  Date of Service:  10/11/2022   CHIEF COMPLAINTS/PURPOSE OF CONSULTATION:  Left Breast Cancer, ER+  REFERRING PHYSICIAN:  [facility]   ASSESSMENT & PLAN:  Margaret Mckenzie is a 71 y.o. post-menopausal female with a history of HTN, depression/anxiety  1. Malignant neoplasm of upper-outer quadrant of left breast, Stage IA, c(T1b, N0), ER+/PR+/HER2-, Grade 2 -found on screening mammogram. Left diagnostic MM and [place] on 09/26/22 showed 0.9 cm mass at 2 o'clock. 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 --We discussed her imaging findings and the biopsy results in great details. -Given the early stage disease, she likely need a lumpectomy. She is agreeable with that. She was seen by Dr. Brantley Stage today and likely will proceed with surgery soon.  -I recommend a Oncotype Dx test on the surgical sample and we'll make a decision about adjuvant chemotherapy based on the Oncotype result. Written material of this test was given to her. She has good health overall,  would be a candidate for chemotherapy if her Oncotype recurrence score is high, although I suspect that she has low risk disease. -Giving the strong ER and PR expression in her postmenopausal status, I recommend adjuvant endocrine therapy with aromatase inhibitor for a total of 5-10 years to reduce the risk of  cancer recurrence. Potential benefits and side effects were discussed with patient and she is interested. -She was also seen by radiation oncologist Dr. Lisbeth Renshaw today. She will likely benefit from breast radiation if she undergo lumpectomy to decrease the risk of breast cancer.  -We also discussed the breast cancer surveillance after her surgery. She will continue annual screening mammogram, self exam, and a routine office visit with lab and exam with [place]. -I encouraged her to have healthy diet and exercise regularly.   2. Bone Health  -Her most recent DEXA was 07/22/21 showing osteopenia, with lowest T-score -2.3 at right femur neck.   PLAN:  -proceed with lumpectomy -Oncotype on her surgical sample -I will see her after surgery or radiation, depends on Oncotype result    Oncology History  Malignant neoplasm of upper-outer quadrant of left breast in female, estrogen receptor positive ([place])  09/26/2022 Imaging   CLINICAL DATA:  71 year old female presenting as a recall from screening for possible left breast mass.   EXAM: DIGITAL DIAGNOSTIC UNILATERAL LEFT MAMMOGRAM WITH TOMOSYNTHESIS; ULTRASOUND LEFT BREAST LIMITED  IMPRESSION: Suspicious mass measuring 0.9 cm in the left breast at 2 o'clock.   09/29/2022 Cancer Staging   Staging form: Breast, AJCC 8th Edition - Clinical stage from 09/29/2022: Stage IA (cT1b, cN0, cM0, G2, ER+, PR+, HER2-) - Signed by Truitt Merle, MD on 10/10/2022 Stage prefix: Initial diagnosis Histologic grading system: 3 grade system   09/29/2022 Initial Biopsy   Diagnosis Breast, left, needle core biopsy, 2:00 7 cmfn, ribbon clip - INVASIVE DUCTAL CARCINOMA, SEE NOTE - TUBULE FORMATION: SCORE 3 - NUCLEAR PLEOMORPHISM: SCORE 2 - MITOTIC COUNT: SCORE 1 - TOTAL SCORE: 6 - OVERALL GRADE:  2 - LYMPHOVASCULAR INVASION: NOT IDENTIFIED - CANCER LENGTH: 0.6 CM - CALCIFICATIONS: NOT IDENTIFIED  PROGNOSTIC INDICATORS Results: The tumor cells are negative for Her2  (1+). Estrogen Receptor: 95%, POSITIVE, STRONG STAINING INTENSITY Progesterone Receptor: 100%, POSITIVE, STRONG STAINING INTENSITY Proliferation Marker Ki67: 10%   10/09/2022 Initial Diagnosis   Malignant neoplasm of upper-outer quadrant of left breast in female, estrogen receptor positive ([place])      HISTORY OF PRESENTING ILLNESS:  Margaret Mckenzie 71 y.o. female is a here because of breast cancer. The patient was [facility] by [facility]. The patient presents to the clinic today alone.   She had routine screening mammography on 09/15/22 showing a possible abnormality in the left breast. She denies any previous abnormal mammograms. She underwent left diagnostic mammography and left breast ultrasonography on 09/26/22 showing: suspicious 0.9 cm mass at 2 o'clock.  Biopsy on 09/29/22 showed: invasive ductal carcinoma, grade 2. Prognostic indicators significant for: estrogen receptor, 95% positive and progesterone receptor, 100% positive. Proliferation marker Ki67 at 10%. HER2 negative.   Today the patient notes they felt/feeling prior/after... -overwhelmed by diagnosis -tenderness from biopsy  She has a PMHx of.... -HTN -A.fib, on eliquis -depression/anxiety, followed by Donnal Moat, PA-C and Dr. Michail Sermon. On medications, stable per pt. -remote h/o kidney stones, s/p lithotripsy  Socially... -she works as a Programme researcher, broadcasting/film/video at [employer] -she is divorced/single, no children -no family history of cancer -former smoker, 0.5 ppd for ~40 years -drinks a glass of wine daily   GYN HISTORY  Menarchal: ~71 years old LMP: ~20 years ago? Contraceptive: HRT: none GP: 0    REVIEW OF SYSTEMS:    Constitutional: Denies fevers, chills or abnormal night sweats Eyes: Denies blurriness of vision, double vision or watery eyes Ears, nose, mouth, throat, and face: Denies mucositis or sore throat Respiratory: Denies cough, dyspnea or wheezes Cardiovascular: Denies palpitation,  chest discomfort or lower extremity swelling Gastrointestinal:  Denies nausea, heartburn or change in bowel habits Skin: Denies abnormal skin rashes Lymphatics: Denies new lymphadenopathy or easy bruising Neurological:Denies numbness, tingling or new weaknesses Behavioral/Psych: Mood is stable, no new changes  All other systems were reviewed with the patient and are negative.   MEDICAL HISTORY:  Past Medical History:  Diagnosis Date   Anxiety    Bipolar 1 disorder ([place])    Chicken pox    Depression    Hypertension    high blood pressure readings    Kidney disease    kidney stones and saw urologist, Dr. Edward Qualia for lithotripsy remotely    PAF (paroxysmal atrial fibrillation) [facility])     SURGICAL HISTORY: Past Surgical History:  Procedure Laterality Date   BUNIONECTOMY  2007   TONSILLECTOMY AND ADENOIDECTOMY  1975    SOCIAL HISTORY: Social History   Socioeconomic History   Marital status: Divorced    Spouse name: Not on file   Number of children: 0   Years of education: Not on file   Highest education level: Associate degree: occupational, Hotel manager, or vocational program  Occupational History   Not on file  Tobacco Use   Smoking status: Former    Packs/day: 0.50    Years: 40.00    Total pack years: 20.00    Types: Cigarettes    Quit date: 07/11/2017    Years since quitting: 5.2   Smokeless tobacco: Never   Tobacco comments:    pt has quit smoking  Vaping Use   Vaping Use: Every day   Substances: Nicotine, Flavoring  Substance and Sexual Activity   Alcohol use: Yes    Alcohol/week: 7.0 standard drinks of alcohol    Types: 7 Glasses of wine per week   Drug use: No   Sexual activity: Not on file  Other Topics Concern   Not on file  Social History Narrative   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      Spiritual Beliefs: episcopalian      Lifestyle: had been going to Y but currently dealing with  meniscal tear; diet good            Social Determinants of Health   Financial Resource Strain: Low Risk  (10/05/2022)   Overall Financial Resource Strain (CARDIA)    Difficulty of Paying Living Expenses: Not hard at all  Recent Concern: Financial Resource Strain - Medium Risk (07/17/2022)   Overall Financial Resource Strain (CARDIA)    Difficulty of Paying Living Expenses: Somewhat hard  Food Insecurity: No Food Insecurity (10/05/2022)   Hunger Vital Sign    Worried About Running Out of Food in the Last Year: Never true    Ran Out of Food in the Last Year: Never true  Transportation Needs: No Transportation Needs (10/05/2022)   PRAPARE - Hydrologist (Medical): No    Lack of Transportation (Non-Medical): No  Physical Activity: Sufficiently Active (10/05/2022)   Exercise Vital Sign    Days of Exercise per Week: 4 days    Minutes of Exercise per Session: 150+ min  Stress: Stress Concern Present (10/05/2022)   [place]    Feeling of Stress : To some extent  Social Connections: Not on file  Intimate Partner Violence: Not on file    FAMILY HISTORY: Family History  Problem Relation Age of Onset   Osteoporosis Mother    Stroke Mother        TIA, stoke   Mental retardation Mother    Hypertension Father    Heart disease Father 27   Stroke Father 9   Mental illness Sister    Breast cancer Neg Hx     ALLERGIES:  is allergic to trazodone and nefazodone and soap.  MEDICATIONS:  Current Outpatient Medications  Medication Sig Dispense Refill   Ascorbic Acid (VITAMIN C) 1000 MG tablet Take 1,000 mg by mouth at bedtime.     busPIRone (BUSPAR) 15 MG tablet TAKE 1 TABLET (15 MG TOTAL) BY MOUTH DAILY. 90 tablet 0   Calcium Carbonate (CALCIUM 600 PO) Take 1,200 mg by mouth at bedtime.     Cholecalciferol (VITAMIN D-3) 25 MCG (1000 UT) CAPS Take 4,000 Units by mouth at bedtime.      cyanocobalamin (,VITAMIN B-12,) 1000 MCG/ML injection INJECT 1 ML (1,000 MCG) INTRAMUSCULARLY EVERY 30 DAYS 3 mL 1   diazepam (VALIUM) 10 MG tablet Take 0.5-1 tablets (5-10 mg total) by mouth every 12 (twelve) hours as needed for anxiety. 60 tablet 5   diltiazem (CARDIZEM) 30 MG tablet Take 1 tablet every 4 hours AS NEEDED for AFIB heart rate >100 45 tablet 1   diltiazem (DILACOR XR) 180 MG 24 hr capsule Take 1 capsule (180 mg total) by mouth daily. 90 capsule 3   ELIQUIS 5 MG TABS tablet TAKE 1 TABLET BY MOUTH TWICE A DAY 180 tablet 3   lamoTRIgine (LAMICTAL) 150 MG tablet TAKE 2 TABLETS BY MOUTH AT BEDTIME. 180 tablet 0  lisinopril (ZESTRIL) 20 MG tablet TAKE 1 TABLET BY MOUTH EVERY DAY 90 tablet 0   Magnesium 400 MG CAPS Take 400 mg by mouth at bedtime.     risperiDONE (RISPERDAL) 3 MG tablet Take 1 tablet (3 mg total) by mouth at bedtime. 90 tablet 1   traZODone (DESYREL) 50 MG tablet TAKE 1 TO 2 TABLETS BY MOUTH AT BEDTIME AS NEEDED FOR SLEEP 180 tablet 0   No current facility-administered medications for this visit.    PHYSICAL EXAMINATION: ECOG PERFORMANCE STATUS: 0 - Asymptomatic  Vitals:   10/11/22 1224  BP: (!) 148/85  Pulse: (!) 102  Resp: 18  Temp: 97.7 F (36.5 C)  SpO2: 98%   Filed Weights   10/11/22 1224  Weight: 178 lb 3.2 oz (80.8 kg)    GENERAL:alert, no distress and comfortable SKIN: skin color, texture, turgor are normal, no rashes or significant lesions EYES: normal, Conjunctiva are pink and non-injected, sclera clear  NECK: supple, thyroid normal size, non-tender, without nodularity LYMPH:  no palpable lymphadenopathy in the cervical, axillary  LUNGS: clear to auscultation and percussion with normal breathing effort HEART: regular rate & rhythm and no murmurs and no lower extremity edema ABDOMEN: soft, non-tender and normal bowel sounds Musculoskeletal:no cyanosis of digits and no clubbing  NEURO: alert & oriented x 3 with fluent speech, no focal  motor/sensory deficits BREAST: 3 x 4 cm hematoma at 2 o'clock (post-biopsy). No palpable mass, nodules or adenopathy bilaterally. Breast exam benign.  LABORATORY DATA:  I have reviewed the data as listed    Latest Ref Rng & Units 10/11/2022   12:05 PM 07/16/2021    1:37 AM 07/15/2021    9:08 AM  CBC  WBC 4.0 - 10.5 K/uL 7.5  6.1  6.4   Hemoglobin 12.0 - 15.0 g/dL 12.2  13.0  13.5   Hematocrit 36.0 - 46.0 % 36.1  37.4  39.5   Platelets 150 - 400 K/uL 221  235  250.0        Latest Ref Rng & Units 10/11/2022   12:05 PM 04/26/2022    1:44 PM 07/29/2021    1:17 PM  CMP  Glucose 70 - 99 mg/dL 89  76  94   BUN 8 - 23 mg/dL _0 Creatinine 0.44 - 1.00 mg/dL 0.70  0.67  0.72   Sodium 135 - 145 mmol/L 128  134  137   Potassium 3.5 - 5.1 mmol/L 4.2  3.9  3.9   Chloride 98 - 111 mmol/L 93  96  101   CO2 22 - 32 mmol/L _1 Calcium 8.9 - 10.3 mg/dL 9.5  9.8  10.2   Total Protein 6.5 - 8.1 g/dL 7.1     Total Bilirubin 0.3 - 1.2 mg/dL 0.7     Alkaline Phos 38 - 126 U/L 90     AST 15 - 41 U/L 46     ALT 0 - 44 U/L 45        RADIOGRAPHIC STUDIES: I have personally reviewed the radiological images as listed and agreed with the findings in the report. [place] LT BREAST BX W LOC DEV 1ST LESION IMG BX SPEC US GUIDE  Addendum Date: 10/02/2022   ADDENDUM REPORT: 10/02/2022 15:53 ADDENDUM: Pathology revealed GRADE 2 INVASIVE DUCTAL CARCINOMA of the LEFT breast, 2 o'clock, 7 cmfn (ribbon clip). This was found to be concordant by Dr. Franki Cabot. Pathology results were discussed with  the patient by telephone. The patient reported doing well after the biopsy with tenderness at the site. Post biopsy instructions and care were reviewed and questions were answered. The patient was encouraged to call [facility] for any additional concerns. The patient was referred to [facility] at [facility] on October 11, 2022. Pathology results reported by Stacie Acres RN on 10/02/2022. Electronically Signed   By: Franki Cabot M.D.   On: 10/02/2022 15:53   Result Date: 10/02/2022 CLINICAL DATA:  Patient presents today for ultrasound-guided biopsy of a LEFT breast mass at the 2 o'clock axis. EXAM: ULTRASOUND GUIDED LEFT BREAST CORE NEEDLE BIOPSY COMPARISON:  Previous exams including LEFT breast diagnostic mammogram and ultrasound dated 09/26/2022. PROCEDURE: I met with the patient and we discussed the procedure of ultrasound-guided biopsy, including benefits and alternatives. We discussed the high likelihood of a successful procedure. We discussed the risks of the procedure, including infection, bleeding, tissue injury, clip migration, and inadequate sampling. Informed written consent was given. The usual time-out protocol was performed immediately prior to the procedure. Lesion quadrant: Upper outer quadrant Using sterile technique and 1% Lidocaine as local anesthetic, under direct ultrasound visualization, a 12 gauge spring-loaded device was used to perform biopsy of the LEFT breast mass at the 2 o'clock axis using a lateral approach. At the conclusion of the procedure , a ribbon shaped tissue marker clip was deployed into the biopsy cavity. Follow up 2 view mammogram was performed and dictated separately. IMPRESSION: Ultrasound guided biopsy of the LEFT breast mass at the 2 o'clock axis. No apparent complications. Electronically Signed: By: Franki Cabot M.D. On: 09/29/2022 14:24  MM CLIP PLACEMENT LEFT  Result Date: 09/29/2022 CLINICAL DATA:  Status post ultrasound-guided biopsy for a LEFT breast mass. EXAM: 3D DIAGNOSTIC LEFT MAMMOGRAM POST ULTRASOUND BIOPSY COMPARISON:  Previous exam(s). FINDINGS: 3D Mammographic images were obtained following ultrasound guided biopsy of the LEFT breast mass at the 2 o'clock axis. The biopsy marking clip is in expected position at the site of biopsy. IMPRESSION: Appropriate positioning of  the ribbon shaped biopsy marking clip at the site of biopsy in the upper-outer quadrant of the LEFT breast. Moderate-sized post biopsy hematoma noted. Hemostasis obtained with manual compression. Compression/pressure bandages applied prior to discharge. Final Assessment: Post Procedure Mammograms for Marker Placement Electronically Signed   By: Franki Cabot M.D.   On: 09/29/2022 14:54  MM DIAG BREAST TOMO UNI LEFT  Result Date: 09/26/2022 CLINICAL DATA:  71 year old female presenting as a recall from screening for possible left breast mass. EXAM: DIGITAL DIAGNOSTIC UNILATERAL LEFT MAMMOGRAM WITH TOMOSYNTHESIS; ULTRASOUND LEFT BREAST LIMITED TECHNIQUE: Left digital diagnostic mammography and breast tomosynthesis was performed.; Targeted ultrasound examination of the left breast was performed. COMPARISON:  Previous exam(s). ACR Breast Density Category b: There are scattered areas of fibroglandular density. FINDINGS: Mammogram: Left breast: Spot compression tomosynthesis views of the left breast were performed demonstrating persistence of a small irregular mass in the upper outer left breast measuring approximately 0.5 cm. Ultrasound: Targeted ultrasound is performed in the left breast at 2 o'clock 7 cm from the nipple demonstrating a hyperechoic mass measuring 0.9 x 0.5 x 0.7 cm. This corresponds to the mammographic finding. Targeted ultrasound of the left axilla demonstrates normal lymph nodes. IMPRESSION: Suspicious mass measuring 0.9 cm in the left breast at 2 o'clock. RECOMMENDATION: Ultrasound-guided core needle biopsy x1 of the left breast. I have discussed the findings and recommendations with  the patient who agrees to proceed with biopsy. The patient will be scheduled for the biopsy appointment prior to leaving the office today. BI-RADS CATEGORY  4: Suspicious. Electronically Signed   By: Audie Pinto M.D.   On: 09/26/2022 15:36  US BREAST LTD UNI LEFT INC AXILLA  Result Date:  09/26/2022 CLINICAL DATA:  71 year old female presenting as a recall from screening for possible left breast mass. EXAM: DIGITAL DIAGNOSTIC UNILATERAL LEFT MAMMOGRAM WITH TOMOSYNTHESIS; ULTRASOUND LEFT BREAST LIMITED TECHNIQUE: Left digital diagnostic mammography and breast tomosynthesis was performed.; Targeted ultrasound examination of the left breast was performed. COMPARISON:  Previous exam(s). ACR Breast Density Category b: There are scattered areas of fibroglandular density. FINDINGS: Mammogram: Left breast: Spot compression tomosynthesis views of the left breast were performed demonstrating persistence of a small irregular mass in the upper outer left breast measuring approximately 0.5 cm. Ultrasound: Targeted ultrasound is performed in the left breast at 2 o'clock 7 cm from the nipple demonstrating a hyperechoic mass measuring 0.9 x 0.5 x 0.7 cm. This corresponds to the mammographic finding. Targeted ultrasound of the left axilla demonstrates normal lymph nodes. IMPRESSION: Suspicious mass measuring 0.9 cm in the left breast at 2 o'clock. RECOMMENDATION: Ultrasound-guided core needle biopsy x1 of the left breast. I have discussed the findings and recommendations with the patient who agrees to proceed with biopsy. The patient will be scheduled for the biopsy appointment prior to leaving the office today. BI-RADS CATEGORY  4: Suspicious. Electronically Signed   By: Audie Pinto M.D.   On: 09/26/2022 15:36  MM 3D SCREEN BREAST BILATERAL  Result Date: 09/18/2022 CLINICAL DATA:  Screening. EXAM: DIGITAL SCREENING BILATERAL MAMMOGRAM WITH TOMOSYNTHESIS AND CAD TECHNIQUE: Bilateral screening digital craniocaudal and mediolateral oblique mammograms were obtained. Bilateral screening digital breast tomosynthesis was performed. The images were evaluated with computer-aided detection. COMPARISON:  Previous exam(s). ACR Breast Density Category b: There are scattered areas of fibroglandular density. FINDINGS:  In the left breast, a possible mass warrants further evaluation. In the right breast, no findings suspicious for malignancy. IMPRESSION: Further evaluation is suggested for a possible mass in the left breast. RECOMMENDATION: Diagnostic mammogram and possibly ultrasound of the left breast. (Code:[id number]) The patient will be contacted regarding the findings, and additional imaging will be scheduled. BI-RADS CATEGORY  0: Incomplete. Need additional imaging evaluation and/or prior mammograms for comparison. Electronically Signed   By: Audie Pinto M.D.   On: 09/18/2022 12:08     No orders of the defined types were placed in this encounter.   All questions were answered. The patient knows to call the clinic with any problems, questions or concerns. The total time spent in the appointment was 45 minutes.     Truitt Merle, MD 10/11/2022 5:37 PM  I, Wilburn Mylar, am acting as scribe for Truitt Merle, MD.   I have reviewed the above documentation for accuracy and completeness, and I agree with the above.

## 2022-10-11 NOTE — Research (Signed)
Trial:[employer] 2021-05 - Specimen Collection Study to Evaluate Biomarkers in Subjects with Cancer     Patient Margaret Mckenzie was identified by Dr. Burr Medico as a potential candidate for the above listed study.  This Clinical Research Nurse met with Margaret Mckenzie, [id number], on 10/11/22 in a manner and location that ensures patient privacy to discuss participation in the above listed research study.  Patient is Unaccompanied.  A copy of the informed consent document with embedded HIPAA language was provided to the patient.  Patient reads, speaks, and understands [place].   Patient was provided with the business card of this Nurse and encouraged to contact the research team with any questions.  Approximately 5 minutes were spent with the patient reviewing the informed consent documents.  Patient was provided the option of taking informed consent documents home to review and was encouraged to review at their convenience with their support network, including other care providers. Patient took the consent documents home to review. Patient agreed for research nurse to call her next week to follow up.  Foye Spurling, BSN, RN, [place] Nurse II [phone] 10/11/2022

## 2022-10-12 ENCOUNTER — Other Ambulatory Visit: Payer: Self-pay | Admitting: Surgery

## 2022-10-12 DIAGNOSIS — C50912 Malignant neoplasm of unspecified site of left female breast: Secondary | ICD-10-CM

## 2022-10-13 ENCOUNTER — Ambulatory Visit: Payer: PPO | Attending: Internal Medicine

## 2022-10-13 ENCOUNTER — Other Ambulatory Visit: Payer: Self-pay

## 2022-10-13 DIAGNOSIS — M5459 Other low back pain: Secondary | ICD-10-CM | POA: Insufficient documentation

## 2022-10-13 DIAGNOSIS — M545 Low back pain, unspecified: Secondary | ICD-10-CM | POA: Insufficient documentation

## 2022-10-13 DIAGNOSIS — M6281 Muscle weakness (generalized): Secondary | ICD-10-CM | POA: Diagnosis not present

## 2022-10-13 DIAGNOSIS — R252 Cramp and spasm: Secondary | ICD-10-CM | POA: Diagnosis not present

## 2022-10-13 DIAGNOSIS — R262 Difficulty in walking, not elsewhere classified: Secondary | ICD-10-CM | POA: Diagnosis not present

## 2022-10-13 DIAGNOSIS — G8929 Other chronic pain: Secondary | ICD-10-CM | POA: Diagnosis not present

## 2022-10-13 NOTE — Therapy (Addendum)
OUTPATIENT PHYSICAL THERAPY THORACOLUMBAR EVALUATION   Patient Name: Margaret Mckenzie MRN: [id number] DOB:05/02/1951, 71 y.o., female Today's Date: 10/13/2022  END OF SESSION:  PT End of Session - 10/13/22 0935     Visit Number 1    Date for PT Re-Evaluation 12/08/22    Authorization Type [employer]    Progress Note Due on Visit 10    PT Start Time 0930    Activity Tolerance Patient tolerated treatment well    Behavior During Therapy [facility] for tasks assessed/performed             Past Medical History:  Diagnosis Date   Anxiety    Bipolar 1 disorder ([place])    Chicken pox    Depression    Hypertension    high blood pressure readings    Kidney disease    kidney stones and saw urologist, Dr. Edward Qualia for lithotripsy remotely    PAF (paroxysmal atrial fibrillation) [facility])    Past Surgical History:  Procedure Laterality Date   BUNIONECTOMY  2007   [place]   Patient Active Problem List   Diagnosis Date Noted   Malignant neoplasm of upper-outer quadrant of left breast in female, estrogen receptor positive ([place]) 10/09/2022   Chronic atrial fibrillation ([place]) 05/08/2022   Hyponatremia 07/15/2021   Generalized anxiety disorder 10/25/2020   Panic disorder 10/25/2020   Bipolar I disorder ([place]) 10/25/2020   Insomnia 10/25/2020   Vitamin B12 deficiency 04/23/2020   Paroxysmal atrial fibrillation ([place]) 08/31/2017   Essential hypertension 08/31/2017    PCP: Isaac Bliss, Rayford Halsted, MD  REFERRING PROVIDER: Isaac Bliss, Rayford Halsted, MD  REFERRING DIAG: [phone] (ICD-10-CM) - Chronic bilateral low back pain without sciatica  Rationale for Evaluation and Treatment: Rehabilitation  THERAPY DIAG:  Other low back pain - Plan: PT plan of care cert/re-cert  Cramp and spasm - Plan: PT plan of care cert/re-cert  Difficulty in walking, not elsewhere classified - Plan: PT plan of care cert/re-cert  Muscle weakness  (generalized) - Plan: PT plan of care cert/re-cert  ONSET DATE: 36/64/4034  SUBJECTIVE:                                                                                                                                                                                           SUBJECTIVE STATEMENT: Patient saw PCP a couple of months ago and was given home exercise program and these exercises helped a lot initially then they stopped helping.  She rises at 4:30 am, begins work at 5 then on her feet up until 11 am.  Then she is off and at home for  the rest of the day.  "I am pretty sedentary at home"  Works 4 days per week.    PERTINENT HISTORY:  Injections years ago.  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  Had back issues but the injections solved her pain and she did not require further intervention.   PAIN:  Are you having pain? Yes: NPRS scale: 2/10 Pain location: middle low back Pain description: aching Aggravating factors: standing, working Relieving factors: [facility] back patches  PRECAUTIONS: None  WEIGHT BEARING RESTRICTIONS: No  FALLS:  Has patient fallen in last 6 months? No  LIVING ENVIRONMENT: Lives with: lives alone Lives in: House/apartment  OCCUPATION: works at [employer]   PLOF: Independent, Independent with basic ADLs, Independent with household mobility without device, Independent with community mobility without device, Independent with homemaking with ambulation, Independent with gait, and Independent with transfers  PATIENT GOALS: to be able to move about and go to work without constant pain  NEXT MD VISIT: none  OBJECTIVE:   DIAGNOSTIC FINDINGS:  IMPRESSION: 03/07/2007 1. The dominant finding is focal and intense bony edema in the right aspect of S1.  Edema is also noted in the right pedicle of L5 and about the right facet joint.  All of this bone edema may be related  to facet arthropathy although a sacral insufficiency fracture of S1 is within the differential.  Appearances are not suggestive of a neoplastic process.   2.  Mild degenerative disk disease at L4-5 and L5-S1 without notable central canal or foraminal stenosis.  PATIENT SURVEYS:  FOTO 67 (goal is 10)  SCREENING FOR RED FLAGS: Bowel or bladder incontinence: No Spinal tumors: No Cauda equina syndrome: No Compression fracture: No Abdominal aneurysm: No  COGNITION: Overall cognitive status: Within functional limits for tasks assessed     SENSATION: WFL  MUSCLE LENGTH: Hamstrings: Right 50 deg; Left 50 deg Thomas test: Right neg ; Left neg  POSTURE:  scoliosis   LUMBAR ROM:   AROM eval  Flexion WNL but with significant shift to right  Extension WNL  Right lateral flexion To just above joint line  Left lateral flexion To mid thigh  Right rotation WFL  Left rotation WFL   (Blank rows = not tested)  LOWER EXTREMITY ROM:     WFL  LOWER EXTREMITY MMT:    Generally 3+/5 proximal hip strength and 4/5 bilateral quads and hamstrings  LUMBAR SPECIAL TESTS:  Straight leg raise test: Negative  FUNCTIONAL TESTS:  Initial eval 10/13/22: 5 times sit to stand: 10.71 sec Timed up and go (TUG): 10.76  GAIT: Distance walked: 30 Assistive device utilized: None Level of assistance: Complete Independence Comments: antalgic/ shifted  TODAY'S TREATMENT:  DATE: 10/13/22 Initial eval completed and initiated HEP    PATIENT EDUCATION:  Education details: Initiated HEP and educated on anatomy of the lumbar spine and DDD Person educated: Patient Education method: Consulting civil engineer, Media planner, Verbal cues, and Handouts Education comprehension: verbalized understanding, returned demonstration, verbal cues required, and tactile cues required  HOME EXERCISE  PROGRAM: Access Code: [email or web address] URL: [email or web address] Date: 10/13/2022 Prepared by: Candyce Churn  Exercises - Supine Hamstring Stretch with Strap  - 1 x daily - 7 x weekly - 1 sets - 3 reps - 30 sec hold - Supine ITB Stretch with Strap  - 1 x daily - 7 x weekly - 1 sets - 3 reps - 30sec hold - Supine Piriformis Stretch Pulling Heel to Hip  - 1 x daily - 7 x weekly - 1 sets - 3 reps - 30 sec hold - Seated Figure 4 Piriformis Stretch  - 1 x daily - 7 x weekly - 1 sets - 3 reps - 30 sec hold  ASSESSMENT:  CLINICAL IMPRESSION: Patient is a 71 y.o. female who was seen today for physical therapy evaluation and treatment for low back pain.  She presents with decreased ROM, strength and function along with elevated pain.  She would benefit from skilled PT for LE flexibility, core stabilization, trunk stretching and strengthening for scoliosis and modalities for pain relief.  We may also address trigger points and tight bands from guarding with manual techniques including dry needling.    OBJECTIVE IMPAIRMENTS: decreased mobility, difficulty walking, decreased ROM, decreased strength, hypomobility, increased fascial restrictions, increased muscle spasms, impaired flexibility, postural dysfunction, and pain.   ACTIVITY LIMITATIONS: carrying, lifting, bending, standing, squatting, sleeping, transfers, and dressing  PARTICIPATION LIMITATIONS: meal prep, cleaning, laundry, shopping, community activity, occupation, and yard work  PERSONAL FACTORS: Fitness, Profession, and 1-2 comorbidities: Htn and congenital scoliosis  are also affecting patient's functional outcome.   REHAB POTENTIAL: Good  CLINICAL DECISION MAKING: Stable/uncomplicated  EVALUATION COMPLEXITY: Low   GOALS: Goals reviewed with patient? Yes  SHORT TERM GOALS: Target date: 11/10/2022    Patient will be independent with initial HEP  Baseline: Goal status: INITIAL  2.  Pain report to be no greater than  4/10  Baseline:  Goal status: INITIAL   LONG TERM GOALS: Target date: 12/08/2022    Patient to be independent with advanced HEP  Baseline:  Goal status: INITIAL  2.  Patient to report pain no greater than 2/10  Baseline:  Goal status: INITIAL  3.  FOTO to be 71 Baseline: 67 Goal status: INITIAL  4.  Functional tests to improve by 2-3 sec Baseline:  Goal status: INITIAL  5.  Patient to be able to do her job for full shift with min difficulty Baseline:  Goal status: INITIAL  6.  Patient to report 85% improvement in overall symptoms  Baseline:  Goal status: INITIAL  PLAN:  PT FREQUENCY: 1-2x/week  PT DURATION: 8 weeks  PLANNED INTERVENTIONS: Therapeutic exercises, Therapeutic activity, Neuromuscular re-education, Balance training, Gait training, Patient/Family education, Self Care, Joint mobilization, Stair training, Aquatic Therapy, Dry Needling, Electrical stimulation, Spinal mobilization, Cryotherapy, Moist heat, Taping, Traction, Ultrasound, Ionotophoresis '4mg'$ /ml Dexamethasone, Manual therapy, and Re-evaluation.  PLAN FOR NEXT SESSION: Review HEP, Nustep, dry needling to lumbar multifidi   Harvel Meskill B. Anaiyah Anglemyer, PT 10/13/22 10:39 AM  [facility] [address] [place], [place] [address] Phone # [phone] Fax [phone]

## 2022-10-13 NOTE — Progress Notes (Unsigned)
OUTPATIENT PHYSICAL THERAPY THORACOLUMBAR EVALUATION   Patient Name: Margaret Mckenzie MRN: [id number] DOB:04-17-1951, 71 y.o., female Today's Date: 10/13/2022  END OF SESSION:   Past Medical History:  Diagnosis Date   Anxiety    Bipolar 1 disorder ([place])    Chicken pox    Depression    Hypertension    high blood pressure readings    Kidney disease    kidney stones and saw urologist, Dr. Edward Qualia for lithotripsy remotely    PAF (paroxysmal atrial fibrillation) [facility])    Past Surgical History:  Procedure Laterality Date   BUNIONECTOMY  2007   [place]   Patient Active Problem List   Diagnosis Date Noted   Malignant neoplasm of upper-outer quadrant of left breast in female, estrogen receptor positive ([place]) 10/09/2022   Chronic atrial fibrillation ([place]) 05/08/2022   Hyponatremia 07/15/2021   Generalized anxiety disorder 10/25/2020   Panic disorder 10/25/2020   Bipolar I disorder ([place]) 10/25/2020   Insomnia 10/25/2020   Vitamin B12 deficiency 04/23/2020   Paroxysmal atrial fibrillation ([place]) 08/31/2017   Essential hypertension 08/31/2017    PCP: Isaac Bliss, Rayford Halsted, MD  REFERRING PROVIDER: Isaac Bliss, Rayford Halsted, MD  REFERRING DIAG: [phone] (ICD-10-CM) - Chronic bilateral low back pain without sciatica  Rationale for Evaluation and Treatment: Rehabilitation  THERAPY DIAG:  Other low back pain  Cramp and spasm  Difficulty in walking, not elsewhere classified  Muscle weakness (generalized)  ONSET DATE: 10/09/2022  SUBJECTIVE:                                                                                                                                                                                           SUBJECTIVE STATEMENT: ***  PERTINENT HISTORY:  ***  PAIN:  Are you having pain? Yes: NPRS scale: ***/10 Pain location: *** Pain description: *** Aggravating factors: *** Relieving factors:  ***  PRECAUTIONS: {Therapy precautions:24002}  WEIGHT BEARING RESTRICTIONS: {Yes ***/No:24003}  FALLS:  Has patient fallen in last 6 months? {fallsyesno:27318}  LIVING ENVIRONMENT: Lives with: {OPRC lives with:25569::"lives with their family"} Lives in: {Lives in:25570} Stairs: {opstairs:27293} Has following equipment at home: {Assistive devices:23999}  OCCUPATION: ***  PLOF: {PLOF:24004}  PATIENT GOALS: ***  NEXT MD VISIT:   OBJECTIVE:   DIAGNOSTIC FINDINGS:  IMPRESSION: last MRI 03/07/2007 1. The dominant finding is focal and intense bony edema in the right aspect of S1.  Edema is also noted in the right pedicle of L5 and about the right facet joint.  All of this bone edema may be related to facet arthropathy although a sacral insufficiency fracture of S1 is within the  differential.  Appearances are not suggestive of a neoplastic process.   2.  Mild degenerative disk disease at L4-5 and L5-S1 without notable central canal or foraminal stenosis.  PATIENT SURVEYS:  FOTO ***  SCREENING FOR RED FLAGS: Bowel or bladder incontinence: {Yes/No:304960894} Spinal tumors: {Yes/No:304960894} Cauda equina syndrome: {Yes/No:304960894} Compression fracture: {Yes/No:304960894} Abdominal aneurysm: {Yes/No:304960894}  COGNITION: Overall cognitive status: {cognition:24006}     SENSATION: {sensation:27233}  MUSCLE LENGTH: Hamstrings: Right *** deg; Left *** deg Thomas test: Right *** deg; Left *** deg  POSTURE: {posture:25561}  PALPATION: ***  LUMBAR ROM:   AROM eval  Flexion   Extension   Right lateral flexion   Left lateral flexion   Right rotation   Left rotation    (Blank rows = not tested)  LOWER EXTREMITY ROM:     {AROM/PROM:27142}  Right eval Left eval  Hip flexion    Hip extension    Hip abduction    Hip adduction    Hip internal rotation    Hip external rotation    Knee flexion    Knee extension    Ankle dorsiflexion    Ankle plantarflexion     Ankle inversion    Ankle eversion     (Blank rows = not tested)  LOWER EXTREMITY MMT:    MMT Right eval Left eval  Hip flexion    Hip extension    Hip abduction    Hip adduction    Hip internal rotation    Hip external rotation    Knee flexion    Knee extension    Ankle dorsiflexion    Ankle plantarflexion    Ankle inversion    Ankle eversion     (Blank rows = not tested)  LUMBAR SPECIAL TESTS:  Straight leg raise test: {pos/neg:25243}  FUNCTIONAL TESTS:  5 times sit to stand: *** Timed up and go (TUG): ***  GAIT: Distance walked: 30 Assistive device utilized: *** Level of assistance: Complete Independence Comments: ***  TODAY'S TREATMENT:                                                                                                                              DATE: 10/13/22 Initial eval completed and initiated HEP    PATIENT EDUCATION:  Education details: Initiated HEP, educated on anatomy of the lumbar spine and various diagnoses of the lumbar spine Person educated: Patient Education method: Consulting civil engineer, Demonstration, Verbal cues, and Handouts Education comprehension: verbalized understanding, returned demonstration, and verbal cues required  HOME EXERCISE PROGRAM: ***  ASSESSMENT:  CLINICAL IMPRESSION: Patient is a 71 y.o. female who was seen today for physical therapy evaluation and treatment for low back pain. She presents with decreased lumbar ROM, bilateral LE decreased flexibility, decreased strength and elevated pain.    OBJECTIVE IMPAIRMENTS: decreased mobility, difficulty walking, decreased ROM, decreased strength, hypomobility, increased muscle spasms, impaired flexibility, and pain.   ACTIVITY LIMITATIONS: carrying, lifting, bending, sitting, standing, squatting, sleeping, stairs, and transfers  PARTICIPATION LIMITATIONS: meal prep, cleaning, laundry, shopping, community activity, and yard work  PERSONAL FACTORS: Fitness, Past/current  experiences, and Time since onset of injury/illness/exacerbation are also affecting patient's functional outcome.   REHAB POTENTIAL: Good  CLINICAL DECISION MAKING: Stable/uncomplicated  EVALUATION COMPLEXITY: Low   GOALS: Goals reviewed with patient? Yes  SHORT TERM GOALS: Target date: 11/10/2022    Pain report to be no greater than 4/10  Baseline: Goal status: INITIAL  2.  Patient will be independent with initial HEP  Baseline:  Goal status: INITIAL   LONG TERM GOALS: Target date: 12/08/2022    Patient to report pain no greater than 2/10  Baseline:  Goal status: INITIAL  2.  Patient to be independent with advanced HEP  Baseline:  Goal status: INITIAL  3.  FOTO to be Baseline:  Goal status: INITIAL  4.  Functional tests to improve by 2-3 seconds to place patient in lower fall risk category Baseline:  Goal status: INITIAL  5.  *** Baseline:  Goal status: {GOALSTATUS:25110}  6.  *** Baseline:  Goal status: {GOALSTATUS:25110}  PLAN:  PT FREQUENCY: 1-2x/week  PT DURATION: 8 weeks  PLANNED INTERVENTIONS: Therapeutic exercises, Therapeutic activity, Neuromuscular re-education, Balance training, Gait training, Patient/Family education, Self Care, Joint mobilization, Stair training, DME instructions, Aquatic Therapy, Dry Needling, Electrical stimulation, Spinal mobilization, Cryotherapy, Moist heat, Taping, Traction, Ultrasound, Ionotophoresis '4mg'$ /ml Dexamethasone, Manual therapy, and Re-evaluation.  PLAN FOR NEXT SESSION: Review HEP, Nustep, begin core exercises   Isabel Caprice, PT 10/13/2022, 8:52 AM

## 2022-10-17 ENCOUNTER — Encounter: Payer: Self-pay | Admitting: *Deleted

## 2022-10-17 ENCOUNTER — Telehealth: Payer: Self-pay | Admitting: *Deleted

## 2022-10-17 ENCOUNTER — Encounter: Payer: Self-pay | Admitting: Internal Medicine

## 2022-10-17 NOTE — Telephone Encounter (Signed)
Spoke with patient to follow up from [facility] 11/15 and assess navigation needs. Patient denies any questions or concerns at this time. Encouraged her to call should anything arise. Patient verbalized understanding.

## 2022-10-17 NOTE — Telephone Encounter (Addendum)
[id number] LVM to follow up on patient's interest in this study. Asked patient to call back at her convenience.  Foye Spurling, BSN, RN, [place] Nurse II [phone] 10/17/2022 2:40 PM  Patient returned call and states she would like to participate in this study. She agreed to meet with research nurse next week on 10/25/22 at 12:00 pm with lab to follow at 12:30 pm.  Asked patient to call back if any questions or need to reschedule appointment. She verbalized understanding.  Foye Spurling, BSN, RN, [place] Nurse II [phone] 10/17/2022 4:23 PM

## 2022-10-20 ENCOUNTER — Other Ambulatory Visit: Payer: Self-pay | Admitting: Physician Assistant

## 2022-10-20 ENCOUNTER — Other Ambulatory Visit: Payer: Self-pay | Admitting: Internal Medicine

## 2022-10-20 DIAGNOSIS — I10 Essential (primary) hypertension: Secondary | ICD-10-CM

## 2022-10-25 ENCOUNTER — Inpatient Hospital Stay: Payer: PPO

## 2022-10-25 ENCOUNTER — Other Ambulatory Visit: Payer: Self-pay

## 2022-10-25 ENCOUNTER — Inpatient Hospital Stay: Payer: PPO | Admitting: *Deleted

## 2022-10-25 DIAGNOSIS — C50412 Malignant neoplasm of upper-outer quadrant of left female breast: Secondary | ICD-10-CM

## 2022-10-25 LAB — RESEARCH LABS

## 2022-10-25 NOTE — Research (Signed)
[employer] 2021-05 - Specimen Collection Study to Evaluate Biomarkers in Subjects with Cancer   This Nurse has reviewed this patient's inclusion and exclusion criteria as a second review and confirms Margaret Mckenzie is eligible for study participation. Patient may continue with enrollment.  Vickii Penna, RN, BSN, CPN Clinical Research Nurse I [phone]  10/25/2022 1:29 PM

## 2022-10-25 NOTE — Research (Signed)
Trial Name:  [employer] 2021-05 - Specimen Collection Study to Evaluate Biomarkers in Subjects with Cancer    Patient Margaret Mckenzie was identified by Dr. Burr Medico as a potential candidate for the above listed study.  This Clinical Research Nurse met with [name], [id number] on 10/25/22 in a manner and location that ensures patient privacy to discuss participation in the above listed research study.  Patient is Unaccompanied.  Patient was previously provided with informed consent documents.  Patient confirmed they have read the informed consent documents.  As outlined in the informed consent form, this Nurse and Ross Marcus discussed the purpose of the research study, the investigational nature of the study, study procedures and requirements for study participation, potential risks and benefits of study participation, as well as alternatives to participation.  This study is not blinded or double-blinded. The patient understands participation is voluntary and they may withdraw from study participation at any time.  This study does not involve randomization.  This study does not involve an investigational drug or device. This study does not involve a placebo. Patient understands enrollment is pending full eligibility review.   Confidentiality and how the patient's information will be used as part of study participation were discussed.  Patient was informed there is reimbursement provided for their time and effort spent on trial participation.  The patient is encouraged to discuss research study participation with their insurance provider to determine what costs they may incur as part of study participation, including research related injury.    All questions were answered to patient's satisfaction.  The informed consent with embedded HIPAA language was reviewed page by page.  The patient's mental and emotional status is appropriate to provide informed consent, and the  patient verbalizes an understanding of study participation.  Patient has agreed to participate in the above listed research study and has voluntarily signed the informed consent version 10 Dec 2020, revised 26 Dec 2021 on 10/25/22 at 12:00PM.  The patient was provided with a copy of the signed informed consent form with embedded HIPAA language for their reference.  No study specific procedures were obtained prior to the signing of the informed consent document.  Approximately 15 minutes were spent with the patient reviewing the informed consent documents.  Patient was not requested to complete a Release of Information form.   Eligibility: Eligibility criteria reviewed with patient. This Nurse has reviewed this patient's inclusion and exclusion criteria and confirmed patient is eligible for study participation. Eligibility confirmed by treating investigator, who also agrees that patient should proceed with enrollment. Patient will continue with enrollment.    Data:  Medical History:  High Blood Pressure  Yes Coronary Artery Disease No Lupus    No Rheumatoid Arthritis  No Diabetes   No      Lynch Syndrome  No  Is the patient currently taking a magnesium supplement?   Yes If yes, dose and frequency? 500 mg every day at bedtime Indication? Prevent leg cramps Start date? 2019  Does the patient have a personal history of cancer (greater than 5 years ago)?  No  Does the patient have a family history of cancer in 1st or 2nd degree relatives? No  Does the patient have history of alcohol consumption? Yes   If yes, current or former? Current If former, year stopped?  Number of years? 49 Drinks per week? 14  Does the patient have history of cigarette, cigar, pipe, or chewing tobacco use?  Yes  If yes, current  for former? Former, If yes, type (Cigarette, cigar, pipe, and/or chewing tobacco)? Cigarettes   If former, year stopped? 2019 Number of years? 40 Packs/number/containers per day? 1    Blood Collection: Research blood obtained by [facility] venipuncture. Patient tolerated well without any adverse events.  Gift Card: Patient was thanked and $50 gift card given to patient for their participation.   Foye Spurling, BSN, RN, [place] Nurse II [phone] 10/25/2022

## 2022-10-26 ENCOUNTER — Encounter (HOSPITAL_BASED_OUTPATIENT_CLINIC_OR_DEPARTMENT_OTHER): Payer: Self-pay | Admitting: Surgery

## 2022-10-26 ENCOUNTER — Encounter: Payer: PPO | Admitting: Internal Medicine

## 2022-10-26 ENCOUNTER — Ambulatory Visit: Payer: PPO | Admitting: Internal Medicine

## 2022-10-26 ENCOUNTER — Telehealth: Payer: Self-pay | Admitting: Cardiology

## 2022-10-26 NOTE — Progress Notes (Signed)
Margaret Mckenzie aware at Dr. Josetta Huddle office we patients cardiologist will need to address patient stopping eliquis for surgery.

## 2022-10-26 NOTE — Telephone Encounter (Signed)
Patient with diagnosis of afib on Eliquis for anticoagulation.    Procedure: left breast lumpectomy Date of procedure: 11/02/22  CHA2DS2-VASc Score = 3  This indicates a 3.2% annual risk of stroke. The patient's score is based upon: CHF History: 0 HTN History: 1 Diabetes History: 0 Stroke History: 0 Vascular Disease History: 0 Age Score: 1 Gender Score: 1   CrCl 67m/min using adjusted body weight Platelet count 221K  Per office protocol, patient can hold Eliquis for 2 days prior to procedure.    **This guidance is not considered finalized until pre-operative APP has relayed final recommendations.**

## 2022-10-26 NOTE — Telephone Encounter (Signed)
   Pre-operative Risk Assessment    Patient Name: Margaret Mckenzie  DOB: 01/08/1951 MRN: [id number]      Request for Surgical Clearance    Procedure:   Left Breast Lumpectomy  Date of Surgery:  Clearance 11/02/22                                 Surgeon:  Dr. Erroll Luna Surgeon's Group or Practice Name:  [facility]  Phone number:  [phone]  Fax number:  [phone]   Type of Clearance Requested:   - Pharmacy:  Hold Apixaban (Eliquis)     Type of Anesthesia:  General    Additional requests/questions:   Caller would like to hold the patient's Eliquis for a few days.  Signed, Heloise Beecham   10/26/2022, 3:07 PM

## 2022-10-27 ENCOUNTER — Telehealth: Payer: Self-pay | Admitting: *Deleted

## 2022-10-27 NOTE — Telephone Encounter (Signed)
I s/w the pt and she has been scheduled for tele pre op appt 10/30/22 @ 1:40. Med rec and consent are done.     Patient Consent for Virtual Visit        Margaret Mckenzie has provided verbal consent on 10/27/2022 for a virtual visit (video or telephone).   CONSENT FOR VIRTUAL VISIT FOR:  Margaret Mckenzie  By participating in this virtual visit I agree to the following:  I hereby voluntarily request, consent and authorize [place] and its employed or contracted physicians, physician assistants, nurse practitioners or other licensed health care professionals (the Practitioner), to provide me with telemedicine health care services (the "Services") as deemed necessary by the treating Practitioner. I acknowledge and consent to receive the Services by the Practitioner via telemedicine. I understand that the telemedicine visit will involve communicating with the Practitioner through live audiovisual communication technology and the disclosure of certain medical information by electronic transmission. I acknowledge that I have been given the opportunity to request an in-person assessment or other available alternative prior to the telemedicine visit and am voluntarily participating in the telemedicine visit.  I understand that I have the right to withhold or withdraw my consent to the use of telemedicine in the course of my care at any time, without affecting my right to future care or treatment, and that the Practitioner or I may terminate the telemedicine visit at any time. I understand that I have the right to inspect all information obtained and/or recorded in the course of the telemedicine visit and may receive copies of available information for a reasonable fee.  I understand that some of the potential risks of receiving the Services via telemedicine include:  Delay or interruption in medical evaluation due to technological equipment failure or disruption; Information  transmitted may not be sufficient (e.g. poor resolution of images) to allow for appropriate medical decision making by the Practitioner; and/or  In rare instances, security protocols could fail, causing a breach of personal health information.  Furthermore, I acknowledge that it is my responsibility to provide information about my medical history, conditions and care that is complete and accurate to the best of my ability. I acknowledge that Practitioner's advice, recommendations, and/or decision may be based on factors not within their control, such as incomplete or inaccurate data provided by me or distortions of diagnostic images or specimens that may result from electronic transmissions. I understand that the practice of medicine is not an exact science and that Practitioner makes no warranties or guarantees regarding treatment outcomes. I acknowledge that a copy of this consent can be made available to me via my patient portal ([place]), or I can request a printed copy by calling the office of [place].    I understand that my insurance will be billed for this visit.   I have read or had this consent read to me. I understand the contents of this consent, which adequately explains the benefits and risks of the Services being provided via telemedicine.  I have been provided ample opportunity to ask questions regarding this consent and the Services and have had my questions answered to my satisfaction. I give my informed consent for the services to be provided through the use of telemedicine in my medical care

## 2022-10-27 NOTE — Telephone Encounter (Signed)
   Name: Margaret Mckenzie  DOB: [birth year]  MRN: [id number]  Primary Cardiologist: Minus Breeding, MD   Preoperative team, please contact this patient and set up a phone call appointment for further preoperative risk assessment. Please obtain consent and complete medication review. Thank you for your help.  I confirm that guidance regarding antiplatelet and oral anticoagulation therapy has been completed and, if necessary, noted below.  Pharmacy has addressed request for anticoagulation hold.   Deberah Pelton, NP 10/27/2022, 8:42 AM [place]

## 2022-10-27 NOTE — Telephone Encounter (Signed)
I s/w the pt and she has been scheduled for tele pre op appt 10/30/22 @ 1:40. Med rec and consent are done.

## 2022-10-30 ENCOUNTER — Ambulatory Visit: Payer: PPO | Attending: Cardiology | Admitting: Physician Assistant

## 2022-10-30 DIAGNOSIS — Z01810 Encounter for preprocedural cardiovascular examination: Secondary | ICD-10-CM | POA: Diagnosis not present

## 2022-10-30 NOTE — Progress Notes (Signed)
Virtual Visit via Telephone Note   Because of Margaret Mckenzie's co-morbid illnesses, she is at least at moderate risk for complications without adequate follow up.  This format is felt to be most appropriate for this patient at this time.  The patient did not have access to video technology/had technical difficulties with video requiring transitioning to audio format only (telephone).  All issues noted in this document were discussed and addressed.  No physical exam could be performed with this format.  Please refer to the patient's chart for her consent to telehealth for Margaret Mckenzie.  Evaluation Performed:  Preoperative cardiovascular risk assessment _____________   Date:  10/30/2022   Patient ID:  Margaret Mckenzie, DOB [birth year], MRN [id number] Patient Location:  Home Provider location:   Office  Primary Care Provider:  Isaac Mckenzie, Margaret Halsted, MD Primary Cardiologist:  Minus Breeding, MD  Chief Complaint / Patient Profile   71 y.o. y/o female with a h/o chronic atrial fibrillation, LVH, anxiety, bipolar 1, depression, HTN, kidney stones who is pending left breast lumpectomy and presents today for telephonic preoperative cardiovascular risk assessment.  History of Present Illness    Margaret Mckenzie is a 71 y.o. female who presents via audio/video conferencing for a telehealth visit today.  Pt was last seen in cardiology clinic on 05/09/22 by Dr. Percival Spanish.  At that time Margaret Mckenzie was doing well. Last echo 2018 EF normal, moderate LVH, trace MR, TR. The patient is now pending procedure as outlined above. Since her last visit, she denies any new cardiac issues or recent DCCV. Works as a Programme researcher, broadcasting/film/video at [employer] and traverses about 3 and a half miles per shift.  Past Medical History    Past Medical History:  Diagnosis Date   Anxiety    Bipolar 1 disorder ([place])    Chicken pox    Depression    Hypertension    high blood pressure  readings    Kidney disease    kidney stones and saw urologist, Dr. Edward Qualia for lithotripsy remotely    PAF (paroxysmal atrial fibrillation) [facility])    Past Surgical History:  Procedure Laterality Date   BUNIONECTOMY  2007   TONSILLECTOMY AND ADENOIDECTOMY  1975    Allergies  Allergies  Allergen Reactions   Trazodone And Nefazodone Other (See Comments)    Trazodone causes lethargy the morning after taking it   Soap Rash    Patients states soap at work drys out hands and has to use special cream to sooth skin.    Home Medications    Prior to Admission medications   Medication Sig Start Date End Date Taking? Authorizing Provider  Ascorbic Acid (VITAMIN C) 1000 MG tablet Take 1,000 mg by mouth at bedtime.    [provider]  busPIRone (BUSPAR) 15 MG tablet TAKE 1 TABLET (15 MG TOTAL) BY MOUTH DAILY. 09/07/22   Addison Lank, PA-C  Calcium Carbonate (CALCIUM 500 PO) Take 1 tablet by mouth at bedtime.    [provider]  Calcium Carbonate (CALCIUM 600 PO) Take 1,200 mg by mouth at bedtime. Patient not taking: Reported on 10/27/2022    [provider]  Cholecalciferol (VITAMIN D-3) 25 MCG (1000 UT) CAPS Take 4,000 Units by mouth at bedtime.    [provider]  cyanocobalamin (,VITAMIN B-12,) 1000 MCG/ML injection INJECT 1 ML (1,000 MCG) INTRAMUSCULARLY EVERY 30 DAYS 05/11/22   Margaret Mckenzie, Margaret Halsted, MD  diazepam (VALIUM) 10 MG tablet Take 0.5-1  tablets (5-10 mg total) by mouth every 12 (twelve) hours as needed for anxiety. 08/09/22   Addison Lank, PA-C  diltiazem (CARDIZEM) 30 MG tablet Take 1 tablet every 4 hours AS NEEDED for AFIB heart rate >100 12/21/20   Minus Breeding, MD  diltiazem (DILACOR XR) 180 MG 24 hr capsule Take 1 capsule (180 mg total) by mouth daily. 11/29/21   Minus Breeding, MD  ELIQUIS 5 MG TABS tablet TAKE 1 TABLET BY MOUTH TWICE A DAY 07/28/22   Minus Breeding, MD  lamoTRIgine (LAMICTAL) 150 MG tablet TAKE 2 TABLETS BY MOUTH  AT BEDTIME 10/24/22   Hurst, Helene Kelp T, PA-C  lisinopril (ZESTRIL) 20 MG tablet TAKE 1 TABLET BY MOUTH EVERY DAY 10/23/22   Margaret Mckenzie, Margaret Halsted, MD  Magnesium 400 MG CAPS Take 400 mg by mouth at bedtime.    [provider]  risperiDONE (RISPERDAL) 3 MG tablet Take 1 tablet (3 mg total) by mouth at bedtime. 08/09/22   Donnal Moat T, PA-C  traZODone (DESYREL) 50 MG tablet TAKE 1 TO 2 TABLETS BY MOUTH AT BEDTIME AS NEEDED FOR SLEEP 08/02/21   Addison Lank, [place]    Physical Exam    Vital Signs:  Margaret Mckenzie does not have vital signs available for review today.  Given telephonic nature of communication, physical exam is limited. AAOx3. NAD. Normal affect.  Speech and respirations are unlabored.  Accessory Clinical Findings    None  Assessment & Plan    1.  Preoperative Cardiovascular Risk Assessment: RCRI 0.4% indicating low CV risk. The patient affirms she has been doing well without any new cardiac symptoms. They are able to achieve over 4 METS without cardiac limitations. She walks approximately 3.5 miles cumulatively per serving shift. Therefore, based on ACC/AHA guidelines, the patient would be at acceptable risk for the planned procedure without further cardiovascular testing. The patient was advised that if she develops new symptoms prior to surgery to contact our office to arrange for a follow-up visit, and she verbalized understanding. The patient was advised that if she develops new symptoms prior to surgery to contact our office to arrange for a follow-up visit, and she verbalized understanding.  Per pharmD review, per office protocol, patient can hold Eliquis for 2 days prior to procedure.  Reviewed with patient given time sensitive nature of clearance and she is in agreement.  A copy of this note will be routed to requesting surgeon.  Time:   Today, I have spent 5 minutes with the patient with telehealth technology discussing medical history, symptoms,  and management plan.     Charlie Pitter, PA-C  10/30/2022, 1:41 PM

## 2022-10-31 ENCOUNTER — Encounter (HOSPITAL_BASED_OUTPATIENT_CLINIC_OR_DEPARTMENT_OTHER)
Admission: RE | Admit: 2022-10-31 | Discharge: 2022-10-31 | Disposition: A | Discharge status: Home / Self Care | Payer: PPO | Source: Ambulatory Visit | Attending: Surgery | Admitting: Surgery

## 2022-10-31 ENCOUNTER — Other Ambulatory Visit: Payer: Self-pay | Admitting: Internal Medicine

## 2022-10-31 ENCOUNTER — Ambulatory Visit
Admission: RE | Admit: 2022-10-31 | Discharge: 2022-10-31 | Disposition: A | Discharge status: Home / Self Care | Payer: PPO | Source: Ambulatory Visit | Attending: Surgery | Admitting: Surgery

## 2022-10-31 DIAGNOSIS — Z01818 Encounter for other preprocedural examination: Secondary | ICD-10-CM | POA: Diagnosis not present

## 2022-10-31 DIAGNOSIS — C50412 Malignant neoplasm of upper-outer quadrant of left female breast: Secondary | ICD-10-CM | POA: Insufficient documentation

## 2022-10-31 DIAGNOSIS — R928 Other abnormal and inconclusive findings on diagnostic imaging of breast: Secondary | ICD-10-CM | POA: Diagnosis not present

## 2022-10-31 DIAGNOSIS — Z170 Estrogen receptor positive status [ER+]: Secondary | ICD-10-CM | POA: Diagnosis not present

## 2022-10-31 DIAGNOSIS — C50912 Malignant neoplasm of unspecified site of left female breast: Secondary | ICD-10-CM

## 2022-10-31 HISTORY — PX: BREAST BIOPSY: SHX20

## 2022-10-31 LAB — BASIC METABOLIC PANEL
Anion gap: 11 (ref 5–15)
BUN: <5 mg/dL — ABNORMAL LOW (ref 8–23)
CO2: 27 mmol/L (ref 22–32)
Calcium: 9.6 mg/dL (ref 8.9–10.3)
Chloride: 100 mmol/L (ref 98–111)
Creatinine, Ser: 0.64 mg/dL (ref 0.44–1.00)
GFR, Estimated: >60 mL/min (ref >60)
Glucose, Bld: 105 mg/dL — ABNORMAL HIGH (ref 70–99)
Potassium: 4.6 mmol/L (ref 3.5–5.1)
Sodium: 138 mmol/L (ref 135–145)

## 2022-10-31 NOTE — Progress Notes (Signed)

## 2022-11-01 ENCOUNTER — Ambulatory Visit: Payer: PPO | Attending: Internal Medicine

## 2022-11-01 DIAGNOSIS — M6281 Muscle weakness (generalized): Secondary | ICD-10-CM | POA: Diagnosis not present

## 2022-11-01 DIAGNOSIS — M5459 Other low back pain: Secondary | ICD-10-CM | POA: Insufficient documentation

## 2022-11-01 DIAGNOSIS — R262 Difficulty in walking, not elsewhere classified: Secondary | ICD-10-CM | POA: Insufficient documentation

## 2022-11-01 DIAGNOSIS — R252 Cramp and spasm: Secondary | ICD-10-CM | POA: Diagnosis not present

## 2022-11-01 NOTE — Therapy (Signed)
OUTPATIENT PHYSICAL THERAPY THORACOLUMBAR TREATMENT NOTE   Patient Name: Margaret Mckenzie MRN: [id number] DOB:[birth year], 71 y.o., female Today's Date: 11/02/2022  END OF SESSION:  PT End of Session - 11/01/22 1540     Visit Number 2    Date for PT Re-Evaluation 12/08/22    Authorization Type [employer]    PT Start Time 1538    PT Stop Time 1616    PT Time Calculation (min) 38 min    Activity Tolerance Patient tolerated treatment well    Behavior During Therapy WFL for tasks assessed/performed             Past Medical History:  Diagnosis Date   Anxiety    Bipolar 1 disorder ([place])    Chicken pox    Depression    Hypertension    high blood pressure readings    Kidney disease    kidney stones and saw urologist, Dr. Edward Qualia for lithotripsy remotely    PAF (paroxysmal atrial fibrillation) [facility])    Past Surgical History:  Procedure Laterality Date   BREAST BIOPSY  10/31/2022   MM LT RADIOACTIVE SEED LOC MAMMO GUIDE 10/31/2022 [facility] MAMMOGRAP[facility]   BUNIONECTOMY  2007   [place]   Patient Active Problem List   Diagnosis Date Noted   Malignant neoplasm of upper-outer quadrant of left breast in female, estrogen receptor positive ([place]) 10/09/2022   Chronic atrial fibrillation ([place]) 05/08/2022   Hyponatremia 07/15/2021   Generalized anxiety disorder 10/25/2020   Panic disorder 10/25/2020   Bipolar I disorder ([place]) 10/25/2020   Insomnia 10/25/2020   Vitamin B12 deficiency 04/23/2020   Paroxysmal atrial fibrillation ([place]) 08/31/2017   Essential hypertension 08/31/2017    PCP: Isaac Bliss, Rayford Halsted, MD  REFERRING PROVIDER: Isaac Bliss, Rayford Halsted, MD  REFERRING DIAG: [phone] (ICD-10-CM) - Chronic bilateral low back pain without sciatica  Rationale for Evaluation and Treatment: Rehabilitation  THERAPY DIAG:  Other low back pain  Cramp and spasm  Difficulty in walking, not elsewhere classified  Muscle  weakness (generalized)  ONSET DATE: 10/09/2022  SUBJECTIVE:                                                                                                                                                                                           SUBJECTIVE STATEMENT: Patient reports she gets temporary relief with the stretches but lost her HEP handout and has forgotten how to do the exercises.     PERTINENT HISTORY:  Injections years ago.  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  Had back issues but the injections solved her pain and she did not require further intervention.   PAIN:  Are you having pain? Yes: NPRS scale: 2/10 Pain location: middle low back Pain description: aching Aggravating factors: standing, working Relieving factors: [facility] back patches  PRECAUTIONS: None  WEIGHT BEARING RESTRICTIONS: No  FALLS:  Has patient fallen in last 6 months? No  LIVING ENVIRONMENT: Lives with: lives alone Lives in: House/apartment  OCCUPATION: works at [employer]   PLOF: Independent, Independent with basic ADLs, Independent with household mobility without device, Independent with community mobility without device, Independent with homemaking with ambulation, Independent with gait, and Independent with transfers  PATIENT GOALS: to be able to move about and go to work without constant pain  NEXT MD VISIT: none  OBJECTIVE:   DIAGNOSTIC FINDINGS:  IMPRESSION: 03/07/2007 1. The dominant finding is focal and intense bony edema in the right aspect of S1.  Edema is also noted in the right pedicle of L5 and about the right facet joint.  All of this bone edema may be related to facet arthropathy although a sacral insufficiency fracture of S1 is within the differential.  Appearances are not suggestive of a neoplastic process.   2.  Mild degenerative disk disease at L4-5 and L5-S1 without notable  central canal or foraminal stenosis.  PATIENT SURVEYS:  FOTO 67 (goal is 72)  SCREENING FOR RED FLAGS: Bowel or bladder incontinence: No Spinal tumors: No Cauda equina syndrome: No Compression fracture: No Abdominal aneurysm: No  COGNITION: Overall cognitive status: Within functional limits for tasks assessed     SENSATION: WFL  MUSCLE LENGTH: Hamstrings: Right 50 deg; Left 50 deg Thomas test: Right neg ; Left neg  POSTURE:  scoliosis   LUMBAR ROM:   AROM eval  Flexion WNL but with significant shift to right  Extension WNL  Right lateral flexion To just above joint line  Left lateral flexion To mid thigh  Right rotation WFL  Left rotation WFL   (Blank rows = not tested)  LOWER EXTREMITY ROM:     WFL  LOWER EXTREMITY MMT:    Generally 3+/5 proximal hip strength and 4/5 bilateral quads and hamstrings  LUMBAR SPECIAL TESTS:  Straight leg raise test: Negative  FUNCTIONAL TESTS:  Initial eval 10/13/22: 5 times sit to stand: 10.71 sec Timed up and go (TUG): 10.76  GAIT: Distance walked: 30 Assistive device utilized: None Level of assistance: Complete Independence Comments: antalgic/ shifted  TODAY'S TREATMENT:                                                                                                                              DATE: 11/01/22 Reviewed HEP: hamstring stretches 2 x 30 sec each, IT band stretches 2 x 30 sec, seated piriformis stretch 2 x 30 sec, hooklying piriformis stretch 1 x 30 sec PPT x 20 PPT with 90/90 heel tap x  20 PPT with dying bug  PPT with shoulder extension to SLR x 10 each LE Lower trunk rotation x 20  DATE: 10/13/22 Initial eval completed and initiated HEP    PATIENT EDUCATION:  Education details: Initiated HEP and educated on anatomy of the lumbar spine and DDD Person educated: Patient Education method: Consulting civil engineer, Media planner, Verbal cues, and Handouts Education comprehension: verbalized understanding, returned  demonstration, verbal cues required, and tactile cues required  HOME EXERCISE PROGRAM: Access Code: [email or web address] URL: [email or web address] Date: 11/01/2022 Prepared by: Candyce Churn  Exercises - Supine Hamstring Stretch with Strap  - 1 x daily - 7 x weekly - 1 sets - 3 reps - 30 sec hold - Supine ITB Stretch with Strap  - 1 x daily - 7 x weekly - 1 sets - 3 reps - 30sec hold - Supine Piriformis Stretch Pulling Heel to Hip  - 1 x daily - 7 x weekly - 1 sets - 3 reps - 30 sec hold - Seated Figure 4 Piriformis Stretch  - 1 x daily - 7 x weekly - 1 sets - 3 reps - 30 sec hold - Supine Posterior Pelvic Tilt  - 1 x daily - 7 x weekly - 3 sets - 10 reps - Supine 90/90 Alternating Heel Touches with Posterior Pelvic Tilt  - 1 x daily - 7 x weekly - 3 sets - 10 reps - Supine Dead Bug with Leg Extension  - 1 x daily - 7 x weekly - 3 sets - 10 reps - Supine Lower Trunk Rotation  - 1 x daily - 7 x weekly - 3 sets - 10 reps  ASSESSMENT:  CLINICAL IMPRESSION: Dak[place] was able to complete HEP with moderated vc's for correct technique.  We added core exercises today.  She had some difficulty maintaining neutral pelvic position but completed all tasks pain free.  Upon sitting up, she did admit her low back was somewhat achy.  She will be having lumpectomy tomorrow.  We modified HEP and reviewed what she would likely be able to safely do a few days after surgery.  She was also instructed to inquire with MD about any restrictions or limitations.  She would benefit from continued skilled PT to address flexibility and core weakness.    OBJECTIVE IMPAIRMENTS: decreased mobility, difficulty walking, decreased ROM, decreased strength, hypomobility, increased fascial restrictions, increased muscle spasms, impaired flexibility, postural dysfunction, and pain.   ACTIVITY LIMITATIONS: carrying, lifting, bending, standing, squatting, sleeping, transfers, and dressing  PARTICIPATION LIMITATIONS: meal prep,  cleaning, laundry, shopping, community activity, occupation, and yard work  PERSONAL FACTORS: Fitness, Profession, and 1-2 comorbidities: Htn and congenital scoliosis  are also affecting patient's functional outcome.   REHAB POTENTIAL: Good  CLINICAL DECISION MAKING: Stable/uncomplicated  EVALUATION COMPLEXITY: Low   GOALS: Goals reviewed with patient? Yes  SHORT TERM GOALS: Target date: 11/10/2022    Patient will be independent with initial HEP  Baseline: Goal status: INITIAL  2.  Pain report to be no greater than 4/10  Baseline:  Goal status: INITIAL   LONG TERM GOALS: Target date: 12/08/2022    Patient to be independent with advanced HEP  Baseline:  Goal status: INITIAL  2.  Patient to report pain no greater than 2/10  Baseline:  Goal status: INITIAL  3.  FOTO to be 71 Baseline: 67 Goal status: INITIAL  4.  Functional tests to improve by 2-3 sec Baseline:  Goal status: INITIAL  5.  Patient to be able to do her job for  full shift with min difficulty Baseline:  Goal status: INITIAL  6.  Patient to report 85% improvement in overall symptoms  Baseline:  Goal status: INITIAL  PLAN:  PT FREQUENCY: 1-2x/week  PT DURATION: 8 weeks  PLANNED INTERVENTIONS: Therapeutic exercises, Therapeutic activity, Neuromuscular re-education, Balance training, Gait training, Patient/Family education, Self Care, Joint mobilization, Stair training, Aquatic Therapy, Dry Needling, Electrical stimulation, Spinal mobilization, Cryotherapy, Moist heat, Taping, Traction, Ultrasound, Ionotophoresis '4mg'$ /ml Dexamethasone, Manual therapy, and Re-evaluation.  PLAN FOR NEXT SESSION: Nustep, dry needling to lumbar multifidi   Olla Delancey B. Augustino Savastano, PT 11/02/22 1:51 AM  [facility] [address] [place], [place] [address] Phone # [phone] Fax [phone]

## 2022-11-02 ENCOUNTER — Encounter (HOSPITAL_BASED_OUTPATIENT_CLINIC_OR_DEPARTMENT_OTHER): Payer: Self-pay | Admitting: Surgery

## 2022-11-02 ENCOUNTER — Ambulatory Visit (HOSPITAL_BASED_OUTPATIENT_CLINIC_OR_DEPARTMENT_OTHER): Payer: PPO | Admitting: Certified Registered"

## 2022-11-02 ENCOUNTER — Other Ambulatory Visit: Payer: Self-pay

## 2022-11-02 ENCOUNTER — Encounter: Payer: PPO | Admitting: Internal Medicine

## 2022-11-02 ENCOUNTER — Ambulatory Visit
Admission: RE | Admit: 2022-11-02 | Discharge: 2022-11-02 | Disposition: A | Discharge status: Home / Self Care | Payer: PPO | Source: Ambulatory Visit | Attending: Surgery | Admitting: Surgery

## 2022-11-02 ENCOUNTER — Ambulatory Visit (HOSPITAL_BASED_OUTPATIENT_CLINIC_OR_DEPARTMENT_OTHER)
Admission: RE | Admit: 2022-11-02 | Discharge: 2022-11-02 | Disposition: A | Discharge status: Home / Self Care | Payer: PPO | Source: Ambulatory Visit | Attending: Surgery | Admitting: Surgery

## 2022-11-02 ENCOUNTER — Encounter (HOSPITAL_BASED_OUTPATIENT_CLINIC_OR_DEPARTMENT_OTHER)
Admission: RE | Disposition: A | Discharge status: Home / Self Care | Payer: Self-pay | Source: Ambulatory Visit | Attending: Surgery

## 2022-11-02 DIAGNOSIS — I10 Essential (primary) hypertension: Secondary | ICD-10-CM | POA: Insufficient documentation

## 2022-11-02 DIAGNOSIS — Z87891 Personal history of nicotine dependence: Secondary | ICD-10-CM | POA: Diagnosis not present

## 2022-11-02 DIAGNOSIS — C50412 Malignant neoplasm of upper-outer quadrant of left female breast: Secondary | ICD-10-CM | POA: Diagnosis not present

## 2022-11-02 DIAGNOSIS — Z170 Estrogen receptor positive status [ER+]: Secondary | ICD-10-CM | POA: Insufficient documentation

## 2022-11-02 DIAGNOSIS — Z79899 Other long term (current) drug therapy: Secondary | ICD-10-CM | POA: Insufficient documentation

## 2022-11-02 DIAGNOSIS — I4891 Unspecified atrial fibrillation: Secondary | ICD-10-CM | POA: Insufficient documentation

## 2022-11-02 DIAGNOSIS — F319 Bipolar disorder, unspecified: Secondary | ICD-10-CM | POA: Insufficient documentation

## 2022-11-02 DIAGNOSIS — Z7901 Long term (current) use of anticoagulants: Secondary | ICD-10-CM | POA: Insufficient documentation

## 2022-11-02 DIAGNOSIS — C50912 Malignant neoplasm of unspecified site of left female breast: Secondary | ICD-10-CM

## 2022-11-02 DIAGNOSIS — F418 Other specified anxiety disorders: Secondary | ICD-10-CM | POA: Diagnosis not present

## 2022-11-02 DIAGNOSIS — N289 Disorder of kidney and ureter, unspecified: Secondary | ICD-10-CM | POA: Diagnosis not present

## 2022-11-02 DIAGNOSIS — F411 Generalized anxiety disorder: Secondary | ICD-10-CM | POA: Insufficient documentation

## 2022-11-02 DIAGNOSIS — I480 Paroxysmal atrial fibrillation: Secondary | ICD-10-CM

## 2022-11-02 HISTORY — PX: BREAST LUMPECTOMY WITH RADIOACTIVE SEED LOCALIZATION: SHX6424

## 2022-11-02 HISTORY — PX: BREAST LUMPECTOMY: SHX2

## 2022-11-02 SURGERY — BREAST LUMPECTOMY WITH RADIOACTIVE SEED LOCALIZATION
Anesthesia: General | Site: Breast | Laterality: Left

## 2022-11-02 MED ORDER — IBUPROFEN 800 MG PO TABS: 800.0000 mg | ORAL_TABLET | Freq: Three times a day (TID) | ORAL | 0 refills | Status: DC | PRN

## 2022-11-02 MED ORDER — ACETAMINOPHEN 500 MG PO TABS
ORAL_TABLET | ORAL | Status: AC
  Filled 2022-11-02: qty 2

## 2022-11-02 MED ORDER — CHLORHEXIDINE GLUCONATE CLOTH 2 % EX PADS: 6.0000 | MEDICATED_PAD | Freq: Once | CUTANEOUS | Status: DC

## 2022-11-02 MED ORDER — GABAPENTIN 300 MG PO CAPS
300.0000 mg | ORAL_CAPSULE | ORAL | Status: AC
  Administered 2022-11-02: 300 mg via ORAL

## 2022-11-02 MED ORDER — PROPOFOL 10 MG/ML IV BOLUS
INTRAVENOUS | Status: DC | PRN
  Administered 2022-11-02: 150 mg via INTRAVENOUS

## 2022-11-02 MED ORDER — LACTATED RINGERS IV SOLN: INTRAVENOUS | Status: DC

## 2022-11-02 MED ORDER — LIDOCAINE HCL (CARDIAC) PF 100 MG/5ML IV SOSY
PREFILLED_SYRINGE | INTRAVENOUS | Status: DC | PRN
  Administered 2022-11-02: 30 mg via INTRAVENOUS

## 2022-11-02 MED ORDER — GABAPENTIN 300 MG PO CAPS
ORAL_CAPSULE | ORAL | Status: AC
  Filled 2022-11-02: qty 1

## 2022-11-02 MED ORDER — BUPIVACAINE-EPINEPHRINE (PF) 0.25% -1:200000 IJ SOLN
INTRAMUSCULAR | Status: DC | PRN
  Administered 2022-11-02: 20 mL

## 2022-11-02 MED ORDER — SODIUM CHLORIDE 0.9 % IV SOLN
INTRAVENOUS | Status: DC | PRN
  Administered 2022-11-02: 500 mL

## 2022-11-02 MED ORDER — OXYCODONE HCL 5 MG PO TABS: 5.0000 mg | ORAL_TABLET | Freq: Four times a day (QID) | ORAL | 0 refills | Status: DC | PRN

## 2022-11-02 MED ORDER — FENTANYL CITRATE (PF) 100 MCG/2ML IJ SOLN
INTRAMUSCULAR | Status: AC
  Filled 2022-11-02: qty 2

## 2022-11-02 MED ORDER — DEXAMETHASONE SODIUM PHOSPHATE 4 MG/ML IJ SOLN
INTRAMUSCULAR | Status: DC | PRN
  Administered 2022-11-02: 4 mg via INTRAVENOUS

## 2022-11-02 MED ORDER — ACETAMINOPHEN 500 MG PO TABS
1000.0000 mg | ORAL_TABLET | ORAL | Status: AC
  Administered 2022-11-02: 1000 mg via ORAL

## 2022-11-02 MED ORDER — CEFAZOLIN SODIUM-DEXTROSE 2-4 GM/100ML-% IV SOLN
INTRAVENOUS | Status: AC
  Filled 2022-11-02: qty 100

## 2022-11-02 MED ORDER — CEFAZOLIN IN SODIUM CHLORIDE 3-0.9 GM/100ML-% IV SOLN
3.0000 g | INTRAVENOUS | Status: AC
  Administered 2022-11-02: 2 g via INTRAVENOUS

## 2022-11-02 MED ORDER — OXYCODONE HCL 5 MG PO TABS
5.0000 mg | ORAL_TABLET | ORAL | Status: DC | PRN
  Administered 2022-11-02: 5 mg via ORAL

## 2022-11-02 MED ORDER — PROPOFOL 500 MG/50ML IV EMUL
INTRAVENOUS | Status: DC | PRN
  Administered 2022-11-02: 25 ug/kg/min via INTRAVENOUS

## 2022-11-02 MED ORDER — OXYCODONE HCL 5 MG PO TABS
ORAL_TABLET | ORAL | Status: AC
  Filled 2022-11-02: qty 1

## 2022-11-02 MED ORDER — FENTANYL CITRATE (PF) 100 MCG/2ML IJ SOLN
INTRAMUSCULAR | Status: DC | PRN
  Administered 2022-11-02 (×2): 50 ug via INTRAVENOUS

## 2022-11-02 SURGICAL SUPPLY — 49 items
ADH SKN CLS APL DERMABOND .7 (GAUZE/BANDAGES/DRESSINGS) ×1
APL PRP STRL LF DISP 70% ISPRP (MISCELLANEOUS) ×1
APPLIER CLIP 9.375 MED OPEN (MISCELLANEOUS) ×1
APR CLP MED 9.3 20 MLT OPN (MISCELLANEOUS) ×1
BINDER BREAST LRG (GAUZE/BANDAGES/DRESSINGS) IMPLANT
BINDER BREAST MEDIUM (GAUZE/BANDAGES/DRESSINGS) IMPLANT
BINDER BREAST XLRG (GAUZE/BANDAGES/DRESSINGS) IMPLANT
BINDER BREAST XXLRG (GAUZE/BANDAGES/DRESSINGS) IMPLANT
BLADE SURG 15 STRL LF DISP TIS (BLADE) ×1 IMPLANT
BLADE SURG 15 STRL SS (BLADE) ×1
CANISTER SUC SOCK COL 7IN (MISCELLANEOUS) IMPLANT
CANISTER SUCT 1200ML W/VALVE (MISCELLANEOUS) IMPLANT
CHLORAPREP W/TINT 26 (MISCELLANEOUS) ×1 IMPLANT
CLIP APPLIE 9.375 MED OPEN (MISCELLANEOUS) IMPLANT
COVER BACK TABLE 60X90IN (DRAPES) ×1 IMPLANT
COVER MAYO STAND STRL (DRAPES) ×1 IMPLANT
COVER PROBE CYLINDRICAL 5X96 (MISCELLANEOUS) ×1 IMPLANT
DERMABOND ADVANCED .7 DNX12 (GAUZE/BANDAGES/DRESSINGS) ×1 IMPLANT
DRAPE LAPAROSCOPIC ABDOMINAL (DRAPES) IMPLANT
DRAPE LAPAROTOMY 100X72 PEDS (DRAPES) ×1 IMPLANT
DRAPE UTILITY XL STRL (DRAPES) ×1 IMPLANT
ELECT COATED BLADE 2.86 ST (ELECTRODE) ×1 IMPLANT
ELECT REM PT RETURN 9FT ADLT (ELECTROSURGICAL) ×1
ELECTRODE REM PT RTRN 9FT ADLT (ELECTROSURGICAL) ×1 IMPLANT
GLOVE BIOGEL PI IND STRL 8 (GLOVE) ×1 IMPLANT
GLOVE ECLIPSE 8.0 STRL XLNG CF (GLOVE) ×1 IMPLANT
GOWN STRL REUS W/ TWL LRG LVL3 (GOWN DISPOSABLE) ×2 IMPLANT
GOWN STRL REUS W/ TWL XL LVL3 (GOWN DISPOSABLE) ×1 IMPLANT
GOWN STRL REUS W/TWL LRG LVL3 (GOWN DISPOSABLE) ×2
GOWN STRL REUS W/TWL XL LVL3 (GOWN DISPOSABLE) ×1
HEMOSTAT ARISTA ABSORB 3G PWDR (HEMOSTASIS) IMPLANT
HEMOSTAT SNOW SURGICEL 2X4 (HEMOSTASIS) IMPLANT
KIT MARKER MARGIN INK (KITS) ×1 IMPLANT
NDL HYPO 25X1 1.5 SAFETY (NEEDLE) ×1 IMPLANT
NEEDLE HYPO 25X1 1.5 SAFETY (NEEDLE) ×1 IMPLANT
NS IRRIG 1000ML POUR BTL (IV SOLUTION) ×1 IMPLANT
PACK BASIN DAY SURGERY FS (CUSTOM PROCEDURE TRAY) ×1 IMPLANT
PENCIL SMOKE EVACUATOR (MISCELLANEOUS) ×1 IMPLANT
SLEEVE SCD COMPRESS KNEE MED (STOCKING) ×1 IMPLANT
SPIKE FLUID TRANSFER (MISCELLANEOUS) IMPLANT
SPONGE T-LAP 4X18 ~~LOC~~+RFID (SPONGE) ×1 IMPLANT
SUT MNCRL AB 4-0 PS2 18 (SUTURE) ×1 IMPLANT
SUT SILK 2 0 SH (SUTURE) IMPLANT
SUT VICRYL 3-0 CR8 SH (SUTURE) ×1 IMPLANT
SYR CONTROL 10ML LL (SYRINGE) ×1 IMPLANT
TOWEL GREEN STERILE FF (TOWEL DISPOSABLE) ×1 IMPLANT
TRAY FAXITRON CT DISP (TRAY / TRAY PROCEDURE) ×1 IMPLANT
TUBE CONNECTING 20X1/4 (TUBING) IMPLANT
YANKAUER SUCT BULB TIP NO VENT (SUCTIONS) IMPLANT

## 2022-11-02 NOTE — Anesthesia Procedure Notes (Signed)
Procedure Name: LMA Insertion Date/Time: 11/02/2022 12:24 PM  Performed by: Kolin Erdahl, Ernesta Amble, CRNAPre-anesthesia Checklist: Patient identified, Emergency Drugs available, Suction available and Patient being monitored Patient Re-evaluated:Patient Re-evaluated prior to induction Oxygen Delivery Method: Circle system utilized Preoxygenation: Pre-oxygenation with 100% oxygen Induction Type: IV induction Ventilation: Mask ventilation without difficulty LMA: LMA inserted LMA Size: 4.0 Number of attempts: 1 Airway Equipment and Method: Bite block Placement Confirmation: positive ETCO2 Tube secured with: Tape Dental Injury: Teeth and Oropharynx as per pre-operative assessment

## 2022-11-02 NOTE — H&P (Signed)
History of Present Illness: Margaret Mckenzie is a 71 y.o. female who is seen today as an office consultation for evaluation of Breast Cancer .   Patient presents the [facility] for evaluation of abnormal left mammogram. 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 She complains of severe bruising from the biopsy. She does take Eliquis for A-fib.  Review of Systems: A complete review of systems was obtained from the patient. I have reviewed this information and discussed as appropriate with the patient. See HPI as well for other ROS.    Medical History: Past Medical History:  Diagnosis Date  Anxiety  History of cancer  Hypertension   Patient Active Problem List  Diagnosis  Essential hypertension  Generalized anxiety disorder  Malignant neoplasm of upper-outer quadrant of left breast in female, estrogen receptor positive   Past Surgical History:  Procedure Laterality Date  TONSILLECTOMY AND ADENOIDECTOMY N/A  1975    Allergies  Allergen Reactions  Nefazodone Other (See Comments)  Hypersensitivity  Trazodone Other (See Comments)  Trazodone causes lethargy the morning after taking it  Soap Rash  Patients states soap at work drys out hands and has to use special cream to sooth skin.   Current Outpatient Medications on File Prior to Visit  Medication Sig Dispense Refill  busPIRone (BUSPAR) 15 MG tablet Take 1 tablet by mouth once daily  diazePAM (VALIUM) 10 MG tablet Take by mouth  dilTIAZem (CARDIZEM) 30 MG tablet Take 1 tablet every 4 hours AS NEEDED for AFIB heart rate >100  dilTIAZem (DILT-XR) 180 MG XR capsule Take by mouth  ELIQUIS 5 mg tablet Take 5 mg by mouth 2 (two) times daily  lamoTRIgine (LAMICTAL) 150 MG tablet Take 2 tablets by mouth at bedtime  lisinopriL (ZESTRIL) 20 MG tablet Take 1 tablet by mouth once daily  risperiDONE (RISPERDAL) 3  MG tablet Take 3 mg by mouth at bedtime   No current facility-administered medications on file prior to visit.   Family History  Problem Relation Age of Onset  High blood pressure (Hypertension) Father  Obesity Sister    Social History   Tobacco Use  Smoking Status Former  Types: Cigarettes  Smokeless Tobacco Never  Tobacco Comments  Quit cigarettes 4 years ago, 2019    Social History   Socioeconomic History  Marital status: Single  Tobacco Use  Smoking status: Former  Types: Cigarettes  Smokeless tobacco: Never  Tobacco comments:  Quit cigarettes 4 years ago, 2019  Vaping Use  Vaping Use: Every day  Substance and Sexual Activity  Alcohol use: Yes  Comment: "Daily"  Drug use: Never   Objective:  There were no vitals filed for this visit.  There is no height or weight on file to calculate BMI.  Physical Exam HENT:  Head: Normocephalic.  Eyes:  Pupils: Pupils are equal, round, and reactive to light.  Cardiovascular:  Rate and Rhythm: Normal rate.  Pulmonary:  Effort: Pulmonary effort is normal.  Breath sounds: No stridor.  Chest:  Breasts: Right: No swelling, bleeding, mass or tenderness.  Left: Swelling and tenderness present. No bleeding or mass.   Comments: Significant bruising left breast with small hematoma Musculoskeletal:  General: Normal range of motion.  Cervical back: Normal range of motion.  Lymphadenopathy:  Upper Body:  Right upper body: No supraclavicular or axillary adenopathy.  Left upper body: No supraclavicular  or axillary adenopathy.  Skin: General: Skin is warm.  Neurological:  General: No focal deficit present.  Mental Status: She is alert.  Psychiatric:  Mood and Affect: Mood normal.     Labs, Imaging and Diagnostic Testing: 9 mm mass left breast upper outer quadrant core biopsy proven to be invasive ductal carcinoma grade 2 ER positive PR positive HER2/neu negative with a Ki 67 of 10%  Assessment and Plan:   Diagnoses  and all orders for this visit:  Malignant neoplasm of upper-outer quadrant of left breast in female, estrogen receptor positive     Discussed breast conserving surgery as well as mastectomy reconstruction. She does work and is eager to get back to work. I reviewed the data on omission of sentinel nodes in patients over the age 14 with stage I disease with favorable markers. Discussed potential risk of 8% of finding disease but her ultrasound is normal. I discussed potential complications and the fact there is no increase in survival with lymph node surgery. She has opted for left breast seed localized lumpectomy. She will need cardiac clearance. She is satisfied with not removing a lymph node at this time. The procedure has been discussed with the patient. Alternatives to surgery have been discussed with the patient. Risks of surgery include bleeding, Infection, Seroma formation, death, and the need for further surgery. The patient understands and wishes to proceed.  No follow-ups on file.  Kennieth Francois, MD

## 2022-11-02 NOTE — Anesthesia Postprocedure Evaluation (Signed)
Anesthesia Post Note  Patient: Margaret Mckenzie  Procedure(s) Performed: LEFT BREAST LUMPECTOMY WITH RADIOACTIVE SEED LOCALIZATION (Left: Breast)     Patient location during evaluation: PACU Anesthesia Type: General Level of consciousness: awake and alert Pain management: pain level controlled Vital Signs Assessment: post-procedure vital signs reviewed and stable Respiratory status: spontaneous breathing, nonlabored ventilation, respiratory function stable and patient connected to nasal cannula oxygen Cardiovascular status: blood pressure returned to baseline and stable Postop Assessment: no apparent nausea or vomiting Anesthetic complications: no   No notable events documented.  Last Vitals:  Vitals:   11/02/22 1345 11/02/22 1400  BP: (!) 143/107 (!) 152/100  Pulse: 98 93  Resp: 15 15  Temp:  (!) 36.1 C  SpO2: 99% 95%    Last Pain:  Vitals:   11/02/22 1400  TempSrc:   PainSc: [place] Barabara Motz

## 2022-11-02 NOTE — Discharge Instructions (Addendum)
[place] Office Phone Number [phone]  BREAST BIOPSY/ PARTIAL MASTECTOMY: POST OP INSTRUCTIONS  Always review your discharge instruction sheet given to you by the facility where your surgery was performed.  IF YOU HAVE DISABILITY OR FAMILY LEAVE FORMS, YOU MUST BRING THEM TO THE OFFICE FOR PROCESSING.  DO NOT GIVE THEM TO YOUR DOCTOR.  A prescription for pain medication may be given to you upon discharge.  Take your pain medication as prescribed, if needed.  If narcotic pain medicine is not needed, then you may take acetaminophen (Tylenol) or ibuprofen (Advil) as needed. Take your usually prescribed medications unless otherwise directed If you need a refill on your pain medication, please contact your pharmacy.  They will contact our office to request authorization.  Prescriptions will not be filled after 5pm or on week-ends. You should eat very light the first 24 hours after surgery, such as soup, crackers, pudding, etc.  Resume your normal diet the day after surgery. Most patients will experience some swelling and bruising in the breast.  Ice packs and a good support bra will help.  Swelling and bruising can take several days to resolve.  It is common to experience some constipation if taking pain medication after surgery.  Increasing fluid intake and taking a stool softener will usually help or prevent this problem from occurring.  A mild laxative (Milk of Magnesia or Miralax) should be taken according to package directions if there are no bowel movements after 48 hours. Unless discharge instructions indicate otherwise, you may remove your bandages 24-48 hours after surgery, and you may shower at that time.  You may have steri-strips (small skin tapes) in place directly over the incision.  These strips should be left on the skin for 7-10 days.  If your surgeon used skin glue on the incision, you may shower in 24 hours.  The glue will flake off over the next 2-3 weeks.  Any  sutures or staples will be removed at the office during your follow-up visit. ACTIVITIES:  You may resume regular daily activities (gradually increasing) beginning the next day.  Wearing a good support bra or sports bra minimizes pain and swelling.  You may have sexual intercourse when it is comfortable. You may drive when you no longer are taking prescription pain medication, you can comfortably wear a seatbelt, and you can safely maneuver your car and apply brakes. RETURN TO WORK:  ______________________________________________________________________________________ Dennis Bast should see your doctor in the office for a follow-up appointment approximately two weeks after your surgery.  Your doctor's nurse will typically make your follow-up appointment when she calls you with your pathology report.  Expect your pathology report 2-3 business days after your surgery.  You may call to check if you do not hear from [place] after three days. OTHER INSTRUCTIONS: _______________________________________________________________________________________________ _____________________________________________________________________________________________________________________________________ _____________________________________________________________________________________________________________________________________ _____________________________________________________________________________________________________________________________________  WHEN TO CALL YOUR DOCTOR: Fever over 101.0 Nausea and/or vomiting. Extreme swelling or bruising. Continued bleeding from incision. Increased pain, redness, or drainage from the incision.  The clinic staff is available to answer your questions during regular business hours.  Please don't hesitate to call and ask to speak to one of the nurses for clinical concerns.  If you have a medical emergency, go to the nearest emergency room or call 911.  A surgeon from [facility] is always on call at the hospital.  For further questions, please visit [email or web address]      RESTART BLOOD THINNER IN 48 HOURS   May take Tylenol after 5pm  if needed.   Post Anesthesia Home Care Instructions  Activity: Get plenty of rest for the remainder of the day. A responsible individual must stay with you for 24 hours following the procedure.  For the next 24 hours, DO NOT: -Drive a car -Paediatric nurse -Drink alcoholic beverages -Take any medication unless instructed by your physician -Make any legal decisions or sign important papers.  Meals: Start with liquid foods such as gelatin or soup. Progress to regular foods as tolerated. Avoid greasy, spicy, heavy foods. If nausea and/or vomiting occur, drink only clear liquids until the nausea and/or vomiting subsides. Call your physician if vomiting continues.  Special Instructions/Symptoms: Your throat may feel dry or sore from the anesthesia or the breathing tube placed in your throat during surgery. If this causes discomfort, gargle with warm salt water. The discomfort should disappear within 24 hours.

## 2022-11-02 NOTE — Interval H&P Note (Signed)
History and Physical Interval Note:  11/02/2022 11:50 AM  Margaret Mckenzie  has presented today for surgery, with the diagnosis of LEFT BREAST CANCER.  The various methods of treatment have been discussed with the patient and family. After consideration of risks, benefits and other options for treatment, the patient has consented to  Procedure(s): LEFT BREAST LUMPECTOMY WITH RADIOACTIVE SEED LOCALIZATION (Left) as a surgical intervention.  The patient's history has been reviewed, patient examined, no change in status, stable for surgery.  I have reviewed the patient's chart and labs.  Questions were answered to the patient's satisfaction.     Turner Daniels MD

## 2022-11-02 NOTE — Op Note (Signed)
Preoperative diagnosis: Stage I left breast cancer upper outer quadrant  Postoperative diagnosis: Same  Procedure: Left breast seed localized lumpectomy  Surgeon: Erroll Luna, MD  Anesthesia: LMA with 0.25% Marcaine with epinephrine and pectoral block per anesthesia protocol  Drains: None  Specimen: Left breast tissue with seed and clip verified by Faxitron.  Additional superior margin taken.  EBL: 10 cc  Indications for procedure: Patient presents for left breast lumpectomy for stage I left breast cancer.  She was seen in the multidisciplinary clinic and opted for breast conserving surgery.  She opted out of sentinel lymph node mapping after discussing the pros and cons and benefit especially with her age and stage of disease.The procedure has been discussed with the patient. Alternatives to surgery have been discussed with the patient.  Risks of surgery include bleeding,  Infection,  Seroma formation, death,  and the need for further surgery.   The patient understands and wishes to proceed.       Description of procedure: The patient was seen in the holding area.  Left side was marked as correct site of note a seed was placed as an outpatient.  All questions were answered.  She was then taken back to the operating room.  She is placed supine upon the operating room table.  After induction of general anesthesia, left breast was prepped and draped in a sterile fashion and timeout performed.  Proper patient, site and procedure were verified.  Neoprobe was used to identify the seed left breast upper outer quadrant.  A curvilinear incision was made over the signal.  Dissection was carried down all tissue around the seed and clip were excised with a grossly negative margin.  Upon review the Faxitron image, both seed and clip were present but I felt the superior margin was close.  This was excised separately.  All margins were oriented with ink.  This was sent to pathology.  Irrigation was used.   Clips were placed to mark the cavity and local anesthetic infiltrated.  No evidence of bleeding at this time.  We then closed the deep layer 3-0 Vicryl.  4 Monocryl was used to close skin in a subcuticular fashion.  Dermabond applied.  All counts found to be correct.  The patient was awoke extubated taken to recovery in satisfactory condition.

## 2022-11-02 NOTE — Anesthesia Preprocedure Evaluation (Addendum)
Anesthesia Evaluation  Patient identified by MRN, date of birth, ID band Patient awake    Reviewed: Allergy & Precautions, NPO status , Patient's Chart, lab work & pertinent test results  Airway Mallampati: II  TM Distance: >3 FB Neck ROM: Full    Dental  (+) Teeth Intact, Dental Advisory Given   Pulmonary former smoker   breath sounds clear to auscultation       Cardiovascular hypertension, Pt. on medications + dysrhythmias Atrial Fibrillation  Rhythm:Irregular Rate:Normal     Neuro/Psych  PSYCHIATRIC DISORDERS Anxiety Depression Bipolar Disorder   negative neurological ROS     GI/Hepatic negative GI ROS, Neg liver ROS,,,  Endo/Other  negative endocrine ROS    Renal/GU Renal disease     Musculoskeletal negative musculoskeletal ROS (+)    Abdominal   Peds  Hematology negative hematology ROS (+)   Anesthesia Other Findings   Reproductive/Obstetrics                             Anesthesia Physical Anesthesia Plan  ASA: 3  Anesthesia Plan: General   Post-op Pain Management: Tylenol PO (pre-op)*   Induction: Intravenous  PONV Risk Score and Plan:   Airway Management Planned: LMA  Additional Equipment: None  Intra-op Plan:   Post-operative Plan: Extubation in OR  Informed Consent: I have reviewed the patients History and Physical, chart, labs and discussed the procedure including the risks, benefits and alternatives for the proposed anesthesia with the patient or authorized representative who has indicated his/her understanding and acceptance.     Dental advisory given  Plan Discussed with: CRNA  Anesthesia Plan Comments:        Anesthesia Quick Evaluation

## 2022-11-02 NOTE — Transfer of Care (Signed)
Immediate Anesthesia Transfer of Care Note  Patient: Margaret Mckenzie  Procedure(s) Performed: LEFT BREAST LUMPECTOMY WITH RADIOACTIVE SEED LOCALIZATION (Left: Breast)  Patient Location: PACU  Anesthesia Type:General  Level of Consciousness: drowsy and patient cooperative  Airway & Oxygen Therapy: Patient Spontanous Breathing and Patient connected to face mask oxygen  Post-op Assessment: Report given to RN and Post -op Vital signs reviewed and stable  Post vital signs: Reviewed and stable  Last Vitals:  Vitals Value Taken Time  BP 110/77 11/02/22 1320  Temp    Pulse 92 11/02/22 1322  Resp 16 11/02/22 1322  SpO2 100 % 11/02/22 1322  Vitals shown include unvalidated device data.  Last Pain:  Vitals:   11/02/22 1057  TempSrc: Oral  PainSc: 0-No pain      Patients Stated Pain Goal: 5 (94/32/00 3794)  Complications: No notable events documented.

## 2022-11-03 ENCOUNTER — Encounter (HOSPITAL_BASED_OUTPATIENT_CLINIC_OR_DEPARTMENT_OTHER): Payer: Self-pay | Admitting: Surgery

## 2022-11-06 ENCOUNTER — Ambulatory Visit (INDEPENDENT_AMBULATORY_CARE_PROVIDER_SITE_OTHER): Payer: PPO | Admitting: Internal Medicine

## 2022-11-06 ENCOUNTER — Encounter: Payer: Self-pay | Admitting: Internal Medicine

## 2022-11-06 VITALS — BP 130/80 | HR 115 | Temp 98.1°F | Ht 64.0 in | Wt 176.5 lb

## 2022-11-06 DIAGNOSIS — R251 Tremor, unspecified: Secondary | ICD-10-CM | POA: Diagnosis not present

## 2022-11-06 DIAGNOSIS — C50412 Malignant neoplasm of upper-outer quadrant of left female breast: Secondary | ICD-10-CM

## 2022-11-06 DIAGNOSIS — Z170 Estrogen receptor positive status [ER+]: Secondary | ICD-10-CM

## 2022-11-06 DIAGNOSIS — C50912 Malignant neoplasm of unspecified site of left female breast: Secondary | ICD-10-CM | POA: Diagnosis not present

## 2022-11-06 DIAGNOSIS — F319 Bipolar disorder, unspecified: Secondary | ICD-10-CM

## 2022-11-06 DIAGNOSIS — I10 Essential (primary) hypertension: Secondary | ICD-10-CM | POA: Diagnosis not present

## 2022-11-06 DIAGNOSIS — E538 Deficiency of other specified B group vitamins: Secondary | ICD-10-CM | POA: Diagnosis not present

## 2022-11-06 DIAGNOSIS — I480 Paroxysmal atrial fibrillation: Secondary | ICD-10-CM

## 2022-11-06 LAB — CBC WITH DIFFERENTIAL/PLATELET
Basophils Absolute: 0 10*3/uL (ref 0.0–0.1)
Basophils Relative: 0.9 % (ref 0.0–3.0)
Eosinophils Absolute: 0.1 10*3/uL (ref 0.0–0.7)
Eosinophils Relative: 1.1 % (ref 0.0–5.0)
HCT: 40.8 % (ref 36.0–46.0)
Hemoglobin: 13.8 g/dL (ref 12.0–15.0)
Lymphocytes Relative: 23.4 % (ref 12.0–46.0)
Lymphs Abs: 1.2 10*3/uL (ref 0.7–4.0)
MCHC: 33.8 g/dL (ref 30.0–36.0)
MCV: 98.8 fl (ref 78.0–100.0)
Monocytes Absolute: 0.6 10*3/uL (ref 0.1–1.0)
Monocytes Relative: 11.3 % (ref 3.0–12.0)
Neutro Abs: 3.3 10*3/uL (ref 1.4–7.7)
Neutrophils Relative %: 63.3 % (ref 43.0–77.0)
Platelets: 251 10*3/uL (ref 150.0–400.0)
RBC: 4.13 Mil/uL (ref 3.87–5.11)
RDW: 14 % (ref 11.5–15.5)
WBC: 5.2 10*3/uL (ref 4.0–10.5)

## 2022-11-06 LAB — LIPID PANEL
Cholesterol: 244 mg/dL — ABNORMAL HIGH (ref 0–200)
HDL: 138.4 mg/dL (ref >39.00)
LDL Cholesterol: 78 mg/dL (ref 0–99)
NonHDL: 105.25
Total CHOL/HDL Ratio: 2
Triglycerides: 136 mg/dL (ref 0.0–149.0)
VLDL: 27.2 mg/dL (ref 0.0–40.0)

## 2022-11-06 LAB — COMPREHENSIVE METABOLIC PANEL
ALT: 60 U/L — ABNORMAL HIGH (ref 0–35)
AST: 59 U/L — ABNORMAL HIGH (ref 0–37)
Albumin: 4.4 g/dL (ref 3.5–5.2)
Alkaline Phosphatase: 73 U/L (ref 39–117)
BUN: 5 mg/dL — ABNORMAL LOW (ref 6–23)
CO2: 30 mEq/L (ref 19–32)
Calcium: 9.5 mg/dL (ref 8.4–10.5)
Chloride: 95 mEq/L — ABNORMAL LOW (ref 96–112)
Creatinine, Ser: 0.65 mg/dL (ref 0.40–1.20)
GFR: 89.15 mL/min (ref >60.00)
Glucose, Bld: 81 mg/dL (ref 70–99)
Potassium: 4.2 mEq/L (ref 3.5–5.1)
Sodium: 135 mEq/L (ref 135–145)
Total Bilirubin: 0.5 mg/dL (ref 0.2–1.2)
Total Protein: 7.1 g/dL (ref 6.0–8.3)

## 2022-11-06 LAB — HEMOGLOBIN A1C: Hgb A1c MFr Bld: 5.2 % (ref 4.6–6.5)

## 2022-11-06 LAB — TSH: TSH: 1.68 u[IU]/mL (ref 0.35–5.50)

## 2022-11-06 LAB — VITAMIN B12: Vitamin B-12: 634 pg/mL (ref 211–911)

## 2022-11-06 LAB — SURGICAL PATHOLOGY

## 2022-11-06 NOTE — Progress Notes (Signed)
Established Patient Office Visit     CC/Reason for Visit: Follow-up chronic conditions  HPI: Margaret Mckenzie is a 71 y.o. female who is coming in today for the above mentioned reasons. Past Medical History is significant for: Hypertension, A-fib, bipolar disorder, previous hyponatremia as well as a new diagnosis of left breast cancer.  She had her lumpectomy on 12/7.  Will be doing radiation.  She is fasting and is requesting labs to be done today.  She has been complaining of tremors of her hands.  She works as a Programme researcher, broadcasting/film/video.   Past Medical/Surgical History: Past Medical History:  Diagnosis Date   Anxiety    Bipolar 1 disorder ([place])    Chicken pox    Depression    Hypertension    high blood pressure readings    Kidney disease    kidney stones and saw urologist, Dr. Edward Qualia for lithotripsy remotely    PAF (paroxysmal atrial fibrillation) [facility])     Past Surgical History:  Procedure Laterality Date   BREAST BIOPSY  10/31/2022   MM LT RADIOACTIVE SEED LOC MAMMO GUIDE 10/31/2022 [facility] MAMMOGRAPHY   BREAST LUMPECTOMY WITH RADIOACTIVE SEED LOCALIZATION Left 11/02/2022   Procedure: LEFT BREAST LUMPECTOMY WITH RADIOACTIVE SEED LOCALIZATION;  Surgeon: Erroll Luna, MD;  Location: [facility];  Service: General;  Laterality: Left;   BUNIONECTOMY  2007   TONSILLECTOMY AND ADENOIDECTOMY  1975    Social History:  reports that she quit smoking about 5 years ago. Her smoking use included cigarettes. She has a 20.00 pack-year smoking history. She has never used smokeless tobacco. She reports current alcohol use of about 14.0 standard drinks of alcohol per week. She reports that she does not use drugs.  Allergies: Allergies  Allergen Reactions   Trazodone And Nefazodone Other (See Comments)    Trazodone causes lethargy the morning after taking it   Soap Rash    Patients states soap at work drys out hands and has to use special cream to sooth skin.    Family  History:  Family History  Problem Relation Age of Onset   Osteoporosis Mother    Stroke Mother        TIA, stoke   Mental retardation Mother    Hypertension Father    Heart disease Father 27   Stroke Father 1   Mental illness Sister    Breast cancer Neg Hx      Current Outpatient Medications:    Ascorbic Acid (VITAMIN C) 1000 MG tablet, Take 1,000 mg by mouth at bedtime., Disp: , Rfl:    busPIRone (BUSPAR) 15 MG tablet, TAKE 1 TABLET (15 MG TOTAL) BY MOUTH DAILY., Disp: 90 tablet, Rfl: 0   Calcium Carbonate (CALCIUM 500 PO), Take 1 tablet by mouth at bedtime., Disp: , Rfl:    Cholecalciferol (VITAMIN D-3) 25 MCG (1000 UT) CAPS, Take 4,000 Units by mouth at bedtime., Disp: , Rfl:    cyanocobalamin (VITAMIN B12) 1000 MCG/ML injection, INJECT 1 ML (1,000 MCG) INTRAMUSCULARLY EVERY 30 DAYS, Disp: 3 mL, Rfl: 1   diazepam (VALIUM) 10 MG tablet, Take 0.5-1 tablets (5-10 mg total) by mouth every 12 (twelve) hours as needed for anxiety., Disp: 60 tablet, Rfl: 5   diltiazem (CARDIZEM) 30 MG tablet, Take 1 tablet every 4 hours AS NEEDED for AFIB heart rate >100, Disp: 45 tablet, Rfl: 1   diltiazem (DILACOR XR) 180 MG 24 hr capsule, Take 1 capsule (180 mg total) by mouth daily., Disp: 90  capsule, Rfl: 3   ELIQUIS 5 MG TABS tablet, TAKE 1 TABLET BY MOUTH TWICE A DAY, Disp: 180 tablet, Rfl: 3   ibuprofen (ADVIL) 800 MG tablet, Take 1 tablet (800 mg total) by mouth every 8 (eight) hours as needed., Disp: 30 tablet, Rfl: 0   lamoTRIgine (LAMICTAL) 150 MG tablet, TAKE 2 TABLETS BY MOUTH AT BEDTIME, Disp: 180 tablet, Rfl: 0   lisinopril (ZESTRIL) 20 MG tablet, TAKE 1 TABLET BY MOUTH EVERY DAY, Disp: 90 tablet, Rfl: 0   Magnesium 400 MG CAPS, Take 400 mg by mouth at bedtime., Disp: , Rfl:    oxyCODONE (OXY IR/ROXICODONE) 5 MG immediate release tablet, Take 1 tablet (5 mg total) by mouth every 6 (six) hours as needed for severe pain., Disp: 15 tablet, Rfl: 0   risperiDONE (RISPERDAL) 3 MG tablet, Take 1  tablet (3 mg total) by mouth at bedtime., Disp: 90 tablet, Rfl: 1   traZODone (DESYREL) 50 MG tablet, TAKE 1 TO 2 TABLETS BY MOUTH AT BEDTIME AS NEEDED FOR SLEEP, Disp: 180 tablet, Rfl: 0   Calcium Carbonate (CALCIUM 600 PO), Take 1,200 mg by mouth at bedtime. (Patient not taking: Reported on 10/27/2022), Disp: , Rfl:   Review of Systems:  Constitutional: Denies fever, chills, diaphoresis, appetite change and fatigue.  HEENT: Denies photophobia, eye pain, redness, hearing loss, ear pain, congestion, sore throat, rhinorrhea, sneezing, mouth sores, trouble swallowing, neck pain, neck stiffness and tinnitus.   Respiratory: Denies SOB, DOE, cough, chest tightness,  and wheezing.   Cardiovascular: Denies chest pain, palpitations and leg swelling.  Gastrointestinal: Denies nausea, vomiting, abdominal pain, diarrhea, constipation, blood in stool and abdominal distention.  Genitourinary: Denies dysuria, urgency, frequency, hematuria, flank pain and difficulty urinating.  Endocrine: Denies: hot or cold intolerance, sweats, changes in hair or nails, polyuria, polydipsia. Musculoskeletal: Denies myalgias, back pain, joint swelling, arthralgias and gait problem.  Skin: Denies pallor, rash and wound.  Neurological: Denies dizziness, seizures, syncope, weakness, light-headedness, numbness and headaches.  Hematological: Denies adenopathy. Easy bruising, personal or family bleeding history  Psychiatric/Behavioral: Denies suicidal ideation, mood changes, confusion, nervousness, sleep disturbance and agitation    Physical Exam: Vitals:   11/06/22 0835  BP: 130/80  Pulse: (!) 115  Temp: 98.1 F (36.7 C)  TempSrc: Oral  SpO2: 98%  Weight: 176 lb 8 oz (80.1 kg)  Height: '5\' 4"'$  (1.626 m)    Body mass index is 30.3 kg/m.   Constitutional: NAD, calm, comfortable Eyes: PERRL, lids and conjunctivae normal, wears corrective lenses ENMT: Mucous membranes are moist.  Respiratory: clear to auscultation  bilaterally, no wheezing, no crackles. Normal respiratory effort. No accessory muscle use.  Cardiovascular: Regular rate and rhythm, no murmurs / rubs / gallops. No extremity edema.  Psychiatric: Normal judgment and insight. Alert and oriented x 3. Normal mood.    Impression and Plan:  Paroxysmal atrial fibrillation ([place]) - Plan: Lipid panel, Hemoglobin A1c  Essential hypertension - Plan: CBC with Differential/Platelet, Comprehensive metabolic panel  Vitamin F74 deficiency - Plan: Vitamin B12  Bipolar I disorder ([place])  Malignant neoplasm of upper-outer quadrant of left breast in female, estrogen receptor positive ([place])  Tremor - Plan: TSH   -Charts reviewed in regards to her recent breast cancer diagnosis.  She had lumpectomy last week and will be doing radiation soon.  Oncotype will determine whether she will need.  Results pending. -Blood pressure is fairly well-controlled. -She is requesting labs today as she is fasting, she is due for annual labs anyway  so I will do. -She is complaining of some tremors of her hands that have gotten worse.  Check TSH, wonder if she might have increased anxiety related to her recent breast cancer diagnosis or even whether her risperidone might be partly at fault.  Time spent:30 minutes reviewing chart, interviewing and examining patient and formulating plan of care.      [name], MD [facility]

## 2022-11-07 ENCOUNTER — Encounter: Payer: Self-pay | Admitting: Surgery

## 2022-11-09 ENCOUNTER — Other Ambulatory Visit: Payer: Self-pay | Admitting: *Deleted

## 2022-11-09 ENCOUNTER — Encounter: Payer: Self-pay | Admitting: *Deleted

## 2022-11-09 ENCOUNTER — Other Ambulatory Visit: Payer: Self-pay | Admitting: Internal Medicine

## 2022-11-09 ENCOUNTER — Encounter: Payer: Self-pay | Admitting: Internal Medicine

## 2022-11-09 ENCOUNTER — Telehealth: Payer: Self-pay | Admitting: Radiation Oncology

## 2022-11-09 DIAGNOSIS — R7401 Elevation of levels of liver transaminase levels: Secondary | ICD-10-CM | POA: Insufficient documentation

## 2022-11-09 DIAGNOSIS — Z170 Estrogen receptor positive status [ER+]: Secondary | ICD-10-CM

## 2022-11-09 NOTE — Telephone Encounter (Signed)
Called patient to schedule a consultation w. Alison. No answer, LVM for a return call.  

## 2022-11-13 ENCOUNTER — Ambulatory Visit: Payer: PPO | Admitting: Physical Therapy

## 2022-11-13 ENCOUNTER — Encounter: Payer: Self-pay | Admitting: *Deleted

## 2022-11-13 ENCOUNTER — Encounter: Payer: Self-pay | Admitting: Physical Therapy

## 2022-11-13 DIAGNOSIS — M5459 Other low back pain: Secondary | ICD-10-CM | POA: Diagnosis not present

## 2022-11-13 DIAGNOSIS — R262 Difficulty in walking, not elsewhere classified: Secondary | ICD-10-CM

## 2022-11-13 DIAGNOSIS — M6281 Muscle weakness (generalized): Secondary | ICD-10-CM

## 2022-11-13 DIAGNOSIS — R252 Cramp and spasm: Secondary | ICD-10-CM

## 2022-11-13 NOTE — Therapy (Signed)
OUTPATIENT PHYSICAL THERAPY THORACOLUMBAR TREATMENT NOTE   Patient Name: Margaret Mckenzie MRN: [id number] DOB:February 08, 1951, 71 y.o., female Today's Date: 11/13/2022  END OF SESSION:  PT End of Session - 11/13/22 1358     Visit Number 3    Date for PT Re-Evaluation 12/08/22    Authorization Type [employer]    Progress Note Due on Visit 10    PT Start Time 8938    PT Stop Time 1438    PT Time Calculation (min) 40 min    Activity Tolerance Patient tolerated treatment well    Behavior During Therapy WFL for tasks assessed/performed              Past Medical History:  Diagnosis Date   Anxiety    Bipolar 1 disorder ([place])    Chicken pox    Depression    Hypertension    high blood pressure readings    Kidney disease    kidney stones and saw urologist, Dr. Edward Qualia for lithotripsy remotely    PAF (paroxysmal atrial fibrillation) [facility])    Past Surgical History:  Procedure Laterality Date   BREAST BIOPSY  10/31/2022   MM LT RADIOACTIVE SEED LOC MAMMO GUIDE 10/31/2022 [facility] MAMMOGRAP[facility]   BREAST LUMPECTOMY WITH RADIOACTIVE SEED LOCALIZATION Left 11/02/2022   Procedure: LEFT BREAST LUMPECTOMY WITH RADIOACTIVE SEED LOCALIZATION;  Surgeon: Erroll Luna, MD;  Location: [facility];  Service: General;  Laterality: Left;   BUNIONECTOMY  2007   [place]   Patient Active Problem List   Diagnosis Date Noted   Transaminitis 11/09/2022   Malignant neoplasm of upper-outer quadrant of left breast in female, estrogen receptor positive ([place]) 10/09/2022   Chronic atrial fibrillation ([place]) 05/08/2022   Hyponatremia 07/15/2021   Generalized anxiety disorder 10/25/2020   Panic disorder 10/25/2020   Bipolar I disorder ([place]) 10/25/2020   Insomnia 10/25/2020   Vitamin B12 deficiency 04/23/2020   Paroxysmal atrial fibrillation ([place]) 08/31/2017   Essential hypertension 08/31/2017    PCP: Isaac Bliss, Rayford Halsted, MD  REFERRING  PROVIDER: Isaac Bliss, Rayford Halsted, MD  REFERRING DIAG: [phone] (ICD-10-CM) - Chronic bilateral low back pain without sciatica  Rationale for Evaluation and Treatment: Rehabilitation  THERAPY DIAG:  Other low back pain  Cramp and spasm  Difficulty in walking, not elsewhere classified  Muscle weakness (generalized)  ONSET DATE: 10/09/2022  SUBJECTIVE:                                                                                                                                                                                           SUBJECTIVE STATEMENT: Had my lumpectomy,  it went well. I don't think I have any restrictions. My back is doing great because I haven't done much lately.    PERTINENT HISTORY:  Injections years ago.  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  Had back issues but the injections solved her pain and she did not require further intervention.   PAIN:  Are you having pain? No  PRECAUTIONS: None  WEIGHT BEARING RESTRICTIONS: No  FALLS:  Has patient fallen in last 6 months? No  LIVING ENVIRONMENT: Lives with: lives alone Lives in: House/apartment  OCCUPATION: works at [employer]   PLOF: Independent, Independent with basic ADLs, Independent with household mobility without device, Independent with community mobility without device, Independent with homemaking with ambulation, Independent with gait, and Independent with transfers  PATIENT GOALS: to be able to move about and go to work without constant pain  NEXT MD VISIT: none  OBJECTIVE:   DIAGNOSTIC FINDINGS:  IMPRESSION: 03/07/2007 1. The dominant finding is focal and intense bony edema in the right aspect of S1.  Edema is also noted in the right pedicle of L5 and about the right facet joint.  All of this bone edema may be related to facet arthropathy although a sacral insufficiency fracture of S1 is  within the differential.  Appearances are not suggestive of a neoplastic process.   2.  Mild degenerative disk disease at L4-5 and L5-S1 without notable central canal or foraminal stenosis.  PATIENT SURVEYS:  FOTO 67 (goal is 48)  SCREENING FOR RED FLAGS: Bowel or bladder incontinence: No Spinal tumors: No Cauda equina syndrome: No Compression fracture: No Abdominal aneurysm: No  COGNITION: Overall cognitive status: Within functional limits for tasks assessed     SENSATION: WFL  MUSCLE LENGTH: Hamstrings: Right 50 deg; Left 50 deg Thomas test: Right neg ; Left neg  POSTURE:  scoliosis   LUMBAR ROM:   AROM eval  Flexion WNL but with significant shift to right  Extension WNL  Right lateral flexion To just above joint line  Left lateral flexion To mid thigh  Right rotation WFL  Left rotation WFL   (Blank rows = not tested)  LOWER EXTREMITY ROM:     WFL  LOWER EXTREMITY MMT:    Generally 3+/5 proximal hip strength and 4/5 bilateral quads and hamstrings  LUMBAR SPECIAL TESTS:  Straight leg raise test: Negative  FUNCTIONAL TESTS:  Initial eval 10/13/22: 5 times sit to stand: 10.71 sec Timed up and go (TUG): 10.76  GAIT: Distance walked: 30 Assistive device utilized: None Level of assistance: Complete Independence Comments: antalgic/ shifted  TODAY'S TREATMENT:   11/13/22: Nustep: L1 6 min LE only while discussing current status.  Review and performance of HEP again as pt is not been compliant. VC/TC on hamstring stretching to not bounce. Pt also requested an alternative method for stretching her piriformis, sitting too painful so pt could do in supine.  Supine Tiny steps with PPT 10x 2: VC for control and breathing Supine clamshells with green loop  and PPT 2x10: VC to maintain tilt then do hip movement. Difficult for pt to do. SLR RT 2x5, 5x LT initiating with PPT Standing PPT against the wall: 5x 10 sec: pt may use this at work to help if her back starts  to hurt.  DATE: 11/01/22 Reviewed HEP: hamstring stretches 2 x 30 sec each, IT band stretches 2 x 30 sec, seated piriformis stretch 2 x 30 sec, hooklying piriformis stretch 1 x 30 sec PPT x 20 PPT with 90/90 heel tap x 20 PPT with dying bug  PPT with shoulder extension to SLR x 10 each LE Lower trunk rotation x 20  DATE: 10/13/22 Initial eval completed and initiated HEP    PATIENT EDUCATION:  Education details: Initiated HEP and educated on anatomy of the lumbar spine and DDD Person educated: Patient Education method: Consulting civil engineer, Media planner, Verbal cues, and Handouts Education comprehension: verbalized understanding, returned demonstration, verbal cues required, and tactile cues required  HOME EXERCISE PROGRAM: Access Code: [email or web address] URL: [email or web address] Date: 11/01/2022 Prepared by: Candyce Churn  Exercises - Supine Hamstring Stretch with Strap  - 1 x daily - 7 x weekly - 1 sets - 3 reps - 30 sec hold - Supine ITB Stretch with Strap  - 1 x daily - 7 x weekly - 1 sets - 3 reps - 30sec hold - Supine Piriformis Stretch Pulling Heel to Hip  - 1 x daily - 7 x weekly - 1 sets - 3 reps - 30 sec hold - Seated Figure 4 Piriformis Stretch  - 1 x daily - 7 x weekly - 1 sets - 3 reps - 30 sec hold - Supine Posterior Pelvic Tilt  - 1 x daily - 7 x weekly - 3 sets - 10 reps - Supine 90/90 Alternating Heel Touches with Posterior Pelvic Tilt  - 1 x daily - 7 x weekly - 3 sets - 10 reps - Supine Dead Bug with Leg Extension  - 1 x daily - 7 x weekly - 3 sets - 10 reps - Supine Lower Trunk Rotation  - 1 x daily - 7 x weekly - 3 sets - 10 reps  Added: Myrene Galas, PTA 11/13/22 2:40 PM Supine piriformis stretch and hip ER static strtech with leg on bad  ASSESSMENT:  CLINICAL IMPRESSION:Pt had her lumpectomy and did well from this. She reports  no restrictions. Pt has not been compliant with her HEP so nothing new was added today.Pt demonstrated weakness in her core and has difficulty maintaining a post pelvic tilt and moving her hips.   OBJECTIVE IMPAIRMENTS: decreased mobility, difficulty walking, decreased ROM, decreased strength, hypomobility, increased fascial restrictions, increased muscle spasms, impaired flexibility, postural dysfunction, and pain.   ACTIVITY LIMITATIONS: carrying, lifting, bending, standing, squatting, sleeping, transfers, and dressing  PARTICIPATION LIMITATIONS: meal prep, cleaning, laundry, shopping, community activity, occupation, and yard work  PERSONAL FACTORS: Fitness, Profession, and 1-2 comorbidities: Htn and congenital scoliosis  are also affecting patient's functional outcome.   REHAB POTENTIAL: Good  CLINICAL DECISION MAKING: Stable/uncomplicated  EVALUATION COMPLEXITY: Low   GOALS: Goals reviewed with patient? Yes  SHORT TERM GOALS: Target date: 11/10/2022    Patient will be independent with initial HEP  Baseline: Goal status: INITIAL  2.  Pain report to be no greater than 4/10  Baseline:  Goal status: INITIAL   LONG TERM GOALS: Target date: 12/08/2022    Patient to be independent with advanced HEP  Baseline:  Goal status: INITIAL  2.  Patient to report pain no greater than 2/10  Baseline:  Goal status: INITIAL  3.  FOTO to be 71 Baseline: 67 Goal status: INITIAL  4.  Functional tests to improve by 2-3 sec Baseline:  Goal status: INITIAL  5.  Patient to be able to do her job  for full shift with min difficulty Baseline:  Goal status: INITIAL  6.  Patient to report 85% improvement in overall symptoms  Baseline:  Goal status: INITIAL  PLAN:  PT FREQUENCY: 1-2x/week  PT DURATION: 8 weeks  PLANNED INTERVENTIONS: Therapeutic exercises, Therapeutic activity, Neuromuscular re-education, Balance training, Gait training, Patient/Family education, Self Care, Joint  mobilization, Stair training, Aquatic Therapy, Dry Needling, Electrical stimulation, Spinal mobilization, Cryotherapy, Moist heat, Taping, Traction, Ultrasound, Ionotophoresis '4mg'$ /ml Dexamethasone, Manual therapy, and Re-evaluation.  PLAN FOR NEXT SESSION: See how her return to work went: Nustep, dry needling to lumbar multifidi   Myrene Galas, PTA 11/13/22 2:40 PM   [facility] [address], [address] [place], [place] [address] Phone # [phone] Fax [phone]

## 2022-11-16 ENCOUNTER — Ambulatory Visit: Payer: PPO

## 2022-11-16 DIAGNOSIS — M5459 Other low back pain: Secondary | ICD-10-CM

## 2022-11-16 DIAGNOSIS — M6281 Muscle weakness (generalized): Secondary | ICD-10-CM

## 2022-11-16 DIAGNOSIS — R252 Cramp and spasm: Secondary | ICD-10-CM

## 2022-11-16 DIAGNOSIS — R262 Difficulty in walking, not elsewhere classified: Secondary | ICD-10-CM

## 2022-11-16 NOTE — Therapy (Signed)
OUTPATIENT PHYSICAL THERAPY THORACOLUMBAR TREATMENT NOTE   Patient Name: Margaret Mckenzie MRN: [id number] DOB:1951/06/13, 71 y.o., female Today's Date: 11/16/2022  END OF SESSION:  PT End of Session - 11/16/22 1535     Visit Number 4    Date for PT Re-Evaluation 12/08/22    Authorization Type [employer]    Progress Note Due on Visit 10    PT Start Time 1530    PT Stop Time 1600    PT Time Calculation (min) 30 min    Activity Tolerance Patient tolerated treatment well    Behavior During Therapy WFL for tasks assessed/performed              Past Medical History:  Diagnosis Date   Anxiety    Bipolar 1 disorder ([place])    Chicken pox    Depression    Hypertension    high blood pressure readings    Kidney disease    kidney stones and saw urologist, Dr. Edward Qualia for lithotripsy remotely    PAF (paroxysmal atrial fibrillation) [facility])    Past Surgical History:  Procedure Laterality Date   BREAST BIOPSY  10/31/2022   MM LT RADIOACTIVE SEED LOC MAMMO GUIDE 10/31/2022 [facility] MAMMOGRAP[facility]   BREAST LUMPECTOMY WITH RADIOACTIVE SEED LOCALIZATION Left 11/02/2022   Procedure: LEFT BREAST LUMPECTOMY WITH RADIOACTIVE SEED LOCALIZATION;  Surgeon: Erroll Luna, MD;  Location: [facility];  Service: General;  Laterality: Left;   BUNIONECTOMY  2007   [place]   Patient Active Problem List   Diagnosis Date Noted   Transaminitis 11/09/2022   Malignant neoplasm of upper-outer quadrant of left breast in female, estrogen receptor positive ([place]) 10/09/2022   Chronic atrial fibrillation ([place]) 05/08/2022   Hyponatremia 07/15/2021   Generalized anxiety disorder 10/25/2020   Panic disorder 10/25/2020   Bipolar I disorder ([place]) 10/25/2020   Insomnia 10/25/2020   Vitamin B12 deficiency 04/23/2020   Paroxysmal atrial fibrillation ([place]) 08/31/2017   Essential hypertension 08/31/2017    PCP: Isaac Bliss, Rayford Halsted, MD  REFERRING  PROVIDER: Isaac Bliss, Rayford Halsted, MD  REFERRING DIAG: [phone] (ICD-10-CM) - Chronic bilateral low back pain without sciatica  Rationale for Evaluation and Treatment: Rehabilitation  THERAPY DIAG:  Other low back pain  Cramp and spasm  Difficulty in walking, not elsewhere classified  Muscle weakness (generalized)  ONSET DATE: 10/09/2022  SUBJECTIVE:                                                                                                                                                                                           SUBJECTIVE STATEMENT: Patient reports she  is doing well.  " I worked a really long day today.  I registered about 5 miles on my pedometer" and no back pain.  "I would like to try the dry needling"     PERTINENT HISTORY:  Injections years ago.  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  Had back issues but the injections solved her pain and she did not require further intervention.   PAIN:  Are you having pain? No  PRECAUTIONS: None  WEIGHT BEARING RESTRICTIONS: No  FALLS:  Has patient fallen in last 6 months? No  LIVING ENVIRONMENT: Lives with: lives alone Lives in: House/apartment  OCCUPATION: works at [employer]   PLOF: Independent, Independent with basic ADLs, Independent with household mobility without device, Independent with community mobility without device, Independent with homemaking with ambulation, Independent with gait, and Independent with transfers  PATIENT GOALS: to be able to move about and go to work without constant pain  NEXT MD VISIT: none  OBJECTIVE:   DIAGNOSTIC FINDINGS:  IMPRESSION: 03/07/2007 1. The dominant finding is focal and intense bony edema in the right aspect of S1.  Edema is also noted in the right pedicle of L5 and about the right facet joint.  All of this bone edema may be related to facet arthropathy although  a sacral insufficiency fracture of S1 is within the differential.  Appearances are not suggestive of a neoplastic process.   2.  Mild degenerative disk disease at L4-5 and L5-S1 without notable central canal or foraminal stenosis.  PATIENT SURVEYS:  FOTO 67 (goal is 25)  SCREENING FOR RED FLAGS: Bowel or bladder incontinence: No Spinal tumors: No Cauda equina syndrome: No Compression fracture: No Abdominal aneurysm: No  COGNITION: Overall cognitive status: Within functional limits for tasks assessed     SENSATION: WFL  MUSCLE LENGTH: Hamstrings: Right 50 deg; Left 50 deg Thomas test: Right neg ; Left neg  POSTURE:  scoliosis   LUMBAR ROM:   AROM eval  Flexion WNL but with significant shift to right  Extension WNL  Right lateral flexion To just above joint line  Left lateral flexion To mid thigh  Right rotation WFL  Left rotation WFL   (Blank rows = not tested)  LOWER EXTREMITY ROM:     WFL  LOWER EXTREMITY MMT:    Generally 3+/5 proximal hip strength and 4/5 bilateral quads and hamstrings  LUMBAR SPECIAL TESTS:  Straight leg raise test: Negative  FUNCTIONAL TESTS:  Initial eval 10/13/22: 5 times sit to stand: 10.71 sec Timed up and go (TUG): 10.76  GAIT: Distance walked: 30 Assistive device utilized: None Level of assistance: Complete Independence Comments: antalgic/ shifted  TODAY'S TREATMENT:   11/16/22: Nustep: L1 6 min LE only while discussing current status. Standing hamstring stretches bilateral 2 x 30 sec Standing quad/hip flexor stretch bilateral 2 x 30 sec  Trigger Point Dry-Needling  Treatment instructions: Expect mild to moderate muscle soreness. S/S of pneumothorax if dry needled over a lung field, and to seek immediate medical attention should they occur. Patient verbalized understanding of these instructions and education.  Patient Consent Given: Yes Education handout provided: Yes Muscles treated: lumbar multifidi and Iliocostalis  lumborum Electrical stimulation performed: No Parameters: N/A Treatment response/outcome: Skilled palpation used to identify trigger points and taut bands in the lumbar area.  Once identified, dry needling techniques used to treat these areas.  Muscle elongation and twitch response noted with 2 of the 6 areas treated.  Patient reported relief of symptoms and decreased tension in her low back following treatment.      11/13/22: Nustep: L1 6 min LE only while discussing current status.  Review and performance of HEP again as pt is not been compliant. VC/TC on hamstring stretching to not bounce. Pt also requested an alternative method for stretching her piriformis, sitting too painful so pt could do in supine.  Supine Tiny steps with PPT 10x 2: VC for control and breathing Supine clamshells with green loop  and PPT 2x10: VC to maintain tilt then do hip movement. Difficult for pt to do. SLR RT 2x5, 5x LT initiating with PPT Standing PPT against the wall: 5x 10 sec: pt may use this at work to help if her back starts to hurt.                                                                                                                            DATE: 11/01/22 Reviewed HEP: hamstring stretches 2 x 30 sec each, IT band stretches 2 x 30 sec, seated piriformis stretch 2 x 30 sec, hooklying piriformis stretch 1 x 30 sec PPT x 20 PPT with 90/90 heel tap x 20 PPT with dying bug  PPT with shoulder extension to SLR x 10 each LE Lower trunk rotation x 20  DATE: 10/13/22 Initial eval completed and initiated HEP    PATIENT EDUCATION:  Education details: Initiated HEP and educated on anatomy of the lumbar spine and DDD Person educated: Patient Education method: Consulting civil engineer, Media planner, Verbal cues, and Handouts Education comprehension: verbalized understanding, returned demonstration, verbal cues required, and tactile cues required  HOME EXERCISE PROGRAM: Access Code: [email or web address] URL:  [email or web address] Date: 11/01/2022 Prepared by: Candyce Churn  Exercises - Supine Hamstring Stretch with Strap  - 1 x daily - 7 x weekly - 1 sets - 3 reps - 30 sec hold - Supine ITB Stretch with Strap  - 1 x daily - 7 x weekly - 1 sets - 3 reps - 30sec hold - Supine Piriformis Stretch Pulling Heel to Hip  - 1 x daily - 7 x weekly - 1 sets - 3 reps - 30 sec hold - Seated Figure 4 Piriformis Stretch  - 1 x daily - 7 x weekly - 1 sets - 3 reps - 30 sec hold - Supine Posterior Pelvic Tilt  - 1 x daily - 7 x weekly - 3 sets - 10 reps - Supine 90/90 Alternating Heel Touches with Posterior Pelvic Tilt  - 1 x daily - 7 x weekly - 3 sets - 10 reps - Supine Dead Bug with Leg Extension  - 1 x daily - 7 x weekly - 3 sets - 10 reps - Supine Lower Trunk Rotation  - 1 x daily - 7 x weekly - 3 sets - 10 reps  Added: Myrene Galas, PTA 11/16/22 4:07 PM  Supine piriformis stretch and hip ER static strtech with leg on bad  ASSESSMENT:  CLINICAL IMPRESSION: Patient did very well with return to work.  She states she worked her whole shift without back pain and put in about 5 miles of walking on that shift.  She had good response to dry needling and was able to complete stretches today without increased pain.  Patients right LE is quite restricted vs left on hamstring stretches.  She is also lacking full knee extension which is likely contributing to abnormal gait and low back pain.  She would benefit from continuing skilled PT for flexibility exercises for bilateral LE's and core strengthening.  Dry needling would also be helpful for muscle guarding and pain control.    OBJECTIVE IMPAIRMENTS: decreased mobility, difficulty walking, decreased ROM, decreased strength, hypomobility, increased fascial restrictions, increased muscle spasms, impaired flexibility, postural dysfunction, and pain.   ACTIVITY LIMITATIONS: carrying, lifting, bending, standing, squatting, sleeping, transfers, and  dressing  PARTICIPATION LIMITATIONS: meal prep, cleaning, laundry, shopping, community activity, occupation, and yard work  PERSONAL FACTORS: Fitness, Profession, and 1-2 comorbidities: Htn and congenital scoliosis  are also affecting patient's functional outcome.   REHAB POTENTIAL: Good  CLINICAL DECISION MAKING: Stable/uncomplicated  EVALUATION COMPLEXITY: Low   GOALS: Goals reviewed with patient? Yes  SHORT TERM GOALS: Target date: 11/10/2022    Patient will be independent with initial HEP  Baseline: Goal status: MET  2.  Pain report to be no greater than 4/10  Baseline:  Goal status: MET   LONG TERM GOALS: Target date: 12/08/2022    Patient to be independent with advanced HEP  Baseline:  Goal status: INITIAL  2.  Patient to report pain no greater than 2/10  Baseline:  Goal status: INITIAL  3.  FOTO to be 71 Baseline: 67 Goal status: INITIAL  4.  Functional tests to improve by 2-3 sec Baseline:  Goal status: INITIAL  5.  Patient to be able to do her job for full shift with min difficulty Baseline:  Goal status: INITIAL  6.  Patient to report 85% improvement in overall symptoms  Baseline:  Goal status: INITIAL  PLAN:  PT FREQUENCY: 1-2x/week  PT DURATION: 8 weeks  PLANNED INTERVENTIONS: Therapeutic exercises, Therapeutic activity, Neuromuscular re-education, Balance training, Gait training, Patient/Family education, Self Care, Joint mobilization, Stair training, Aquatic Therapy, Dry Needling, Electrical stimulation, Spinal mobilization, Cryotherapy, Moist heat, Taping, Traction, Ultrasound, Ionotophoresis 85m/ml Dexamethasone, Manual therapy, and Re-evaluation.  PLAN FOR NEXT SESSION:  Nustep, hamstring and quad/hipflexor stretching, dry needling to lumbar multifidi, progress core strengthening.    JAnderson MaltaB. Kavonte Bearse, PT 11/16/22 4:07 PM    [facility] [address] [id number] #  [phone] [phone]

## 2022-11-20 ENCOUNTER — Other Ambulatory Visit: Payer: Self-pay | Admitting: Internal Medicine

## 2022-11-22 ENCOUNTER — Ambulatory Visit: Payer: PPO

## 2022-11-22 DIAGNOSIS — M6281 Muscle weakness (generalized): Secondary | ICD-10-CM

## 2022-11-22 DIAGNOSIS — M5459 Other low back pain: Secondary | ICD-10-CM | POA: Diagnosis not present

## 2022-11-22 DIAGNOSIS — R252 Cramp and spasm: Secondary | ICD-10-CM

## 2022-11-22 DIAGNOSIS — R262 Difficulty in walking, not elsewhere classified: Secondary | ICD-10-CM

## 2022-11-22 NOTE — Therapy (Signed)
OUTPATIENT PHYSICAL THERAPY THORACOLUMBAR TREATMENT NOTE   Patient Name: Margaret Mckenzie MRN: [id number] DOB:22-Dec-1950, 71 y.o., female Today's Date: 11/22/2022  END OF SESSION:  PT End of Session - 11/22/22 1451     Visit Number 5    Date for PT Re-Evaluation 12/08/22    Authorization Type [employer]    PT Start Time 1448    PT Stop Time 1520    PT Time Calculation (min) 32 min    Activity Tolerance Patient tolerated treatment well    Behavior During Therapy WFL for tasks assessed/performed              Past Medical History:  Diagnosis Date   Anxiety    Bipolar 1 disorder ([place])    Chicken pox    Depression    Hypertension    high blood pressure readings    Kidney disease    kidney stones and saw urologist, Dr. Edward Qualia for lithotripsy remotely    PAF (paroxysmal atrial fibrillation) [facility])    Past Surgical History:  Procedure Laterality Date   BREAST BIOPSY  10/31/2022   MM LT RADIOACTIVE SEED LOC MAMMO GUIDE 10/31/2022 [facility] MAMMOGRAPHY   BREAST LUMPECTOMY WITH RADIOACTIVE SEED LOCALIZATION Left 11/02/2022   Procedure: LEFT BREAST LUMPECTOMY WITH RADIOACTIVE SEED LOCALIZATION;  Surgeon: Erroll Luna, MD;  Location: [place];  Service: General;  Laterality: Left;   BUNIONECTOMY  2007   [place]   Patient Active Problem List   Diagnosis Date Noted   Transaminitis 11/09/2022   Malignant neoplasm of upper-outer quadrant of left breast in female, estrogen receptor positive ([place]) 10/09/2022   Chronic atrial fibrillation ([place]) 05/08/2022   Hyponatremia 07/15/2021   Generalized anxiety disorder 10/25/2020   Panic disorder 10/25/2020   Bipolar I disorder ([place]) 10/25/2020   Insomnia 10/25/2020   Vitamin B12 deficiency 04/23/2020   Paroxysmal atrial fibrillation ([place]) 08/31/2017   Essential hypertension 08/31/2017    PCP: Isaac Bliss, Rayford Halsted, MD  REFERRING PROVIDER: Isaac Bliss, Rayford Halsted, MD  REFERRING DIAG: [phone] (ICD-10-CM) - Chronic bilateral low back pain without sciatica  Rationale for Evaluation and Treatment: Rehabilitation  THERAPY DIAG:  Other low back pain  Cramp and spasm  Difficulty in walking, not elsewhere classified  Muscle weakness (generalized)  ONSET DATE: 10/09/2022  SUBJECTIVE:                                                                                                                                                                                           SUBJECTIVE STATEMENT: Patient reports she is doing great.  She felt the needling really  helped and she is back to doing her usual work load without any pain.  She rates her pain at 0/10 today and she worked all morning.     PERTINENT HISTORY:  Injections years ago.  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  Had back issues but the injections solved her pain and she did not require further intervention.   PAIN:  Are you having pain? No  PRECAUTIONS: None  WEIGHT BEARING RESTRICTIONS: No  FALLS:  Has patient fallen in last 6 months? No  LIVING ENVIRONMENT: Lives with: lives alone Lives in: House/apartment  OCCUPATION: works at [employer]   PLOF: Independent, Independent with basic ADLs, Independent with household mobility without device, Independent with community mobility without device, Independent with homemaking with ambulation, Independent with gait, and Independent with transfers  PATIENT GOALS: to be able to move about and go to work without constant pain  NEXT MD VISIT: none  OBJECTIVE:   DIAGNOSTIC FINDINGS:  IMPRESSION: 03/07/2007 1. The dominant finding is focal and intense bony edema in the right aspect of S1.  Edema is also noted in the right pedicle of L5 and about the right facet joint.  All of this bone edema may be related to facet arthropathy although a sacral  insufficiency fracture of S1 is within the differential.  Appearances are not suggestive of a neoplastic process.   2.  Mild degenerative disk disease at L4-5 and L5-S1 without notable central canal or foraminal stenosis.  PATIENT SURVEYS:  FOTO 67 (goal is 27) 11/22/22: 82  SCREENING FOR RED FLAGS: Bowel or bladder incontinence: No Spinal tumors: No Cauda equina syndrome: No Compression fracture: No Abdominal aneurysm: No  COGNITION: Overall cognitive status: Within functional limits for tasks assessed     SENSATION: WFL  MUSCLE LENGTH: Hamstrings: Right 50 deg; Left 50 deg Thomas test: Right neg ; Left neg  POSTURE:  scoliosis   LUMBAR ROM:   AROM eval 11/22/22  Flexion WNL but with significant shift to right WNL without pain  Extension WNL WNL  Right lateral flexion To just above joint line WNL  Left lateral flexion To mid thigh WNL  Right rotation WFL WNL  Left rotation WFL WNL   (Blank rows = not tested)  LOWER EXTREMITY ROM:     WFL  LOWER EXTREMITY MMT:    Generally 3+/5 proximal hip strength and 4/5 bilateral quads and hamstrings 11/22/22: generally 4/5 proximal hip strength and 4+/5 bilateral quads and hamstrings  LUMBAR SPECIAL TESTS:  Straight leg raise test: Negative  FUNCTIONAL TESTS:  Initial eval 10/13/22: 5 times sit to stand: 10.71 sec Timed up and go (TUG): 10.76 DC 11/22/22: 5 times sit to stand: 8.68 sec Timed up and go (TUG): 8.07 sec  GAIT: Distance walked: 30 Assistive device utilized: None Level of assistance: Complete Independence Comments: antalgic/ shifted  TODAY'S TREATMENT:   11/22/22: Nustep: L3 5 min LE only while discussing current status. Reviewed HEP DC assessment completed  Trigger Point Dry-Needling  Treatment instructions: Expect mild to moderate muscle soreness. S/S of pneumothorax if dry needled over a lung field, and to seek immediate medical attention should they occur. Patient verbalized understanding of  these instructions and education.  Patient Consent Given: Yes Education handout provided: Yes Muscles treated: lumbar multifidi and Iliocostalis lumborum Electrical stimulation performed: No Parameters: N/A Treatment response/outcome: Skilled palpation used to identify  trigger points and taut bands in the lumbar area.  Once identified, dry needling techniques used to treat these areas.  Muscle elongation and twitch response noted with 4 of the 8 areas treated.  Patient reported relief of symptoms and decreased tension in her low back following treatment.      11/16/22: Nustep: L1 6 min LE only while discussing current status. Standing hamstring stretches bilateral 2 x 30 sec Standing quad/hip flexor stretch bilateral 2 x 30 sec  Trigger Point Dry-Needling  Treatment instructions: Expect mild to moderate muscle soreness. S/S of pneumothorax if dry needled over a lung field, and to seek immediate medical attention should they occur. Patient verbalized understanding of these instructions and education.  Patient Consent Given: Yes Education handout provided: Yes Muscles treated: lumbar multifidi and Iliocostalis lumborum Electrical stimulation performed: No Parameters: N/A Treatment response/outcome: Skilled palpation used to identify trigger points and taut bands in the lumbar area.  Once identified, dry needling techniques used to treat these areas.  Muscle elongation and twitch response noted with 2 of the 6 areas treated.  Patient reported relief of symptoms and decreased tension in her low back following treatment.      11/13/22: Nustep: L1 6 min LE only while discussing current status.  Review and performance of HEP again as pt is not been compliant. VC/TC on hamstring stretching to not bounce. Pt also requested an alternative method for stretching her piriformis, sitting too painful so pt could do in supine.  Supine Tiny steps with PPT 10x 2: VC for control and breathing Supine  clamshells with green loop  and PPT 2x10: VC to maintain tilt then do hip movement. Difficult for pt to do. SLR RT 2x5, 5x LT initiating with PPT Standing PPT against the wall: 5x 10 sec: pt may use this at work to help if her back starts to hurt.    PATIENT EDUCATION:  Education details: Initiated HEP and educated on anatomy of the lumbar spine and DDD Person educated: Patient Education method: Consulting civil engineer, Media planner, Verbal cues, and Handouts Education comprehension: verbalized understanding, returned demonstration, verbal cues required, and tactile cues required  HOME EXERCISE PROGRAM: Access Code: [email or web address] URL: [email or web address] Date: 11/01/2022 Prepared by: Candyce Churn  Exercises - Supine Hamstring Stretch with Strap  - 1 x daily - 7 x weekly - 1 sets - 3 reps - 30 sec hold - Supine ITB Stretch with Strap  - 1 x daily - 7 x weekly - 1 sets - 3 reps - 30sec hold - Supine Piriformis Stretch Pulling Heel to Hip  - 1 x daily - 7 x weekly - 1 sets - 3 reps - 30 sec hold - Seated Figure 4 Piriformis Stretch  - 1 x daily - 7 x weekly - 1 sets - 3 reps - 30 sec hold - Supine Posterior Pelvic Tilt  - 1 x daily - 7 x weekly - 3 sets - 10 reps - Supine 90/90 Alternating Heel Touches with Posterior Pelvic Tilt  - 1 x daily - 7 x weekly - 3 sets - 10 reps - Supine Dead Bug with Leg Extension  - 1 x daily - 7 x weekly - 3 sets - 10 reps - Supine Lower Trunk Rotation  - 1 x daily - 7 x weekly - 3 sets - 10 reps  Added: Myrene Galas, PTA 11/22/22 3:35 PM Supine piriformis stretch and hip ER static strtech with leg on bad  ASSESSMENT:  CLINICAL IMPRESSION: Marleigh arrives with no complaints  of pain and feeling like she is back to her normal functional level.  All objective findings and FOTO scores have significantly improved.  She has met all goals and agrees to discharge today.  She is well motivated and compliant.  She should continue to do well.    OBJECTIVE  IMPAIRMENTS: decreased mobility, difficulty walking, decreased ROM, decreased strength, hypomobility, increased fascial restrictions, increased muscle spasms, impaired flexibility, postural dysfunction, and pain.   ACTIVITY LIMITATIONS: carrying, lifting, bending, standing, squatting, sleeping, transfers, and dressing  PARTICIPATION LIMITATIONS: meal prep, cleaning, laundry, shopping, community activity, occupation, and yard work  PERSONAL FACTORS: Fitness, Profession, and 1-2 comorbidities: Htn and congenital scoliosis  are also affecting patient's functional outcome.   REHAB POTENTIAL: Good  CLINICAL DECISION MAKING: Stable/uncomplicated  EVALUATION COMPLEXITY: Low   GOALS: Goals reviewed with patient? Yes  SHORT TERM GOALS: Target date: 11/10/2022    Patient will be independent with initial HEP  Baseline: Goal status: MET  2.  Pain report to be no greater than 4/10  Baseline:  Goal status: MET   LONG TERM GOALS: Target date: 12/08/2022    Patient to be independent with advanced HEP  Baseline:  Goal status: MET  2.  Patient to report pain no greater than 2/10  Baseline:  Goal status: MET  3.  FOTO to be 71 Baseline: 67 Goal status: INITIAL  4.  Functional tests to improve by 2-3 sec Baseline:  Goal status: MET  5.  Patient to be able to do her job for full shift with min difficulty Baseline:  Goal status: MET  6.  Patient to report 85% improvement in overall symptoms  Baseline:  Goal status: MET  PLAN:  PT FREQUENCY: 1-2x/week  PT DURATION: 8 weeks  PLANNED INTERVENTIONS: Therapeutic exercises, Therapeutic activity, Neuromuscular re-education, Balance training, Gait training, Patient/Family education, Self Care, Joint mobilization, Stair training, Aquatic Therapy, Dry Needling, Electrical stimulation, Spinal mobilization, Cryotherapy, Moist heat, Taping, Traction, Ultrasound, Ionotophoresis 70m/ml Dexamethasone, Manual therapy, and  Re-evaluation.  PLAN FOR NEXT SESSION:  Nustep, hamstring and quad/hipflexor stretching, dry needling to lumbar multifidi, progress core strengthening.   PHYSICAL THERAPY DISCHARGE SUMMARY  Visits from Start of Care: 5  Current functional level related to goals / functional outcomes: See above   Remaining deficits: See above   Education / Equipment: See above   Patient agrees to discharge. Patient goals were met. Patient is being discharged due to meeting the stated rehab goals.   JAnderson MaltaB. Andree Golphin, PT 11/22/22 3:35 PM    [facility] [id number] [place] [address]Phone # [phone] [phone]

## 2022-11-24 ENCOUNTER — Other Ambulatory Visit: Payer: Self-pay | Admitting: Cardiology

## 2022-11-28 ENCOUNTER — Ambulatory Visit: Payer: PPO

## 2022-11-30 ENCOUNTER — Ambulatory Visit
Admission: RE | Admit: 2022-11-30 | Discharge: 2022-11-30 | Disposition: A | Discharge status: Home / Self Care | Payer: PPO | Source: Ambulatory Visit | Attending: Radiation Oncology | Admitting: Radiation Oncology

## 2022-11-30 ENCOUNTER — Encounter: Payer: Self-pay | Admitting: Radiation Oncology

## 2022-11-30 ENCOUNTER — Other Ambulatory Visit: Payer: Self-pay

## 2022-11-30 VITALS — BP 151/92 | HR 75 | Temp 96.3°F | Resp 18 | Ht 64.0 in | Wt 175.5 lb

## 2022-11-30 DIAGNOSIS — Z170 Estrogen receptor positive status [ER+]: Secondary | ICD-10-CM | POA: Insufficient documentation

## 2022-11-30 DIAGNOSIS — F319 Bipolar disorder, unspecified: Secondary | ICD-10-CM | POA: Insufficient documentation

## 2022-11-30 DIAGNOSIS — Z87891 Personal history of nicotine dependence: Secondary | ICD-10-CM | POA: Insufficient documentation

## 2022-11-30 DIAGNOSIS — Z79899 Other long term (current) drug therapy: Secondary | ICD-10-CM | POA: Insufficient documentation

## 2022-11-30 DIAGNOSIS — I10 Essential (primary) hypertension: Secondary | ICD-10-CM | POA: Diagnosis not present

## 2022-11-30 DIAGNOSIS — I480 Paroxysmal atrial fibrillation: Secondary | ICD-10-CM | POA: Insufficient documentation

## 2022-11-30 DIAGNOSIS — C50412 Malignant neoplasm of upper-outer quadrant of left female breast: Secondary | ICD-10-CM | POA: Insufficient documentation

## 2022-11-30 DIAGNOSIS — Z87442 Personal history of urinary calculi: Secondary | ICD-10-CM | POA: Insufficient documentation

## 2022-11-30 MED ORDER — DOXYCYCLINE HYCLATE 100 MG PO TABS: 100.0000 mg | ORAL_TABLET | Freq: Two times a day (BID) | ORAL | 0 refills | Status: DC

## 2022-11-30 NOTE — Progress Notes (Signed)
Radiation Oncology         [phone]) [phone] ________________________________  Name: Margaret Mckenzie        MRN: [id number]  Date of Service: 11/30/2022 DOB: 04/01/1951  CC:Isaac Bliss, Rayford Halsted, MD  Truitt Merle, MD     REFERRING PHYSICIAN: Truitt Merle, MD   DIAGNOSIS: The encounter diagnosis was Malignant neoplasm of upper-outer quadrant of left breast in female, estrogen receptor positive ([place]).   HISTORY OF PRESENT ILLNESS: Margaret Mckenzie is a 72 y.o. female originally seen in the multidisciplinary breast clinic for a new diagnosis of left breast cancer. The patient was noted to have a mass in the breast at 2:00 position measuring 9 mm. Her axilla was negative for adenopathy. 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    Since her last visit she underwent a left lumpectomy on 11/02/22 with Dr. Brantley Stage. Final pathology revealed a grade 1 invasive ductal carcinoma measurin 6 mm in greatest dimension, the report also mentioned a tumor size of 1.4 cm but it appears this is the overal size of the inflammed cavity from prior biopsy, but only 6 mm was invasive carcinoma. The margins were clear, and no nodes were removed. No Oncotype Dx is planned, so  chemotherapy is not recommended. She's seen today to discuss treatment recommendations of her cancer.      PREVIOUS RADIATION THERAPY: No   PAST MEDICAL HISTORY:  Past Medical History:  Diagnosis Date   Anxiety    Bipolar 1 disorder ([place])    Chicken pox    Depression    Hypertension    high blood pressure readings    Kidney disease    kidney stones and saw urologist, Dr. Edward Qualia for lithotripsy remotely    PAF (paroxysmal atrial fibrillation) [facility])        PAST SURGICAL HISTORY: Past Surgical History:  Procedure Laterality Date   BREAST BIOPSY  10/31/2022   MM LT RADIOACTIVE SEED LOC MAMMO GUIDE 10/31/2022 [facility] MAMMOGRAPHY   BREAST LUMPECTOMY WITH  RADIOACTIVE SEED LOCALIZATION Left 11/02/2022   Procedure: LEFT BREAST LUMPECTOMY WITH RADIOACTIVE SEED LOCALIZATION;  Surgeon: Erroll Luna, MD;  Location: [facility];  Service: General;  Laterality: Left;   BUNIONECTOMY  2007   TONSILLECTOMY AND ADENOIDECTOMY  1975     FAMILY HISTORY:  Family History  Problem Relation Age of Onset   Osteoporosis Mother    Stroke Mother        TIA, stoke   Mental retardation Mother    Hypertension Father    Heart disease Father 27   Stroke Father 32   Mental illness Sister    Breast cancer Neg Hx      SOCIAL HISTORY:  reports that she quit smoking about 5 years ago. Her smoking use included cigarettes. She has a 20.00 pack-year smoking history. She has never used smokeless tobacco. She reports current alcohol use of about 14.0 standard drinks of alcohol per week. She reports that she does not use drugs. The patient is single and lives in [place]. She's close with her sister and enjoys weekly visits with her. She works at [employer] early in the morning til about noon.    ALLERGIES: Trazodone and nefazodone and Soap   MEDICATIONS:  Current Outpatient Medications  Medication Sig Dispense Refill   doxycycline (VIBRA-TABS) 100 MG tablet Take 1 tablet (100 mg total) by mouth 2 (two) times daily.  20 tablet 0   Ascorbic Acid (VITAMIN C) 1000 MG tablet Take 1,000 mg by mouth at bedtime.     busPIRone (BUSPAR) 15 MG tablet TAKE 1 TABLET (15 MG TOTAL) BY MOUTH DAILY. 90 tablet 0   Calcium Carbonate (CALCIUM 500 PO) Take 1 tablet by mouth at bedtime.     Calcium Carbonate (CALCIUM 600 PO) Take 1,200 mg by mouth at bedtime. (Patient not taking: Reported on 10/27/2022)     Cholecalciferol (VITAMIN D-3) 25 MCG (1000 UT) CAPS Take 4,000 Units by mouth at bedtime.     cyanocobalamin (VITAMIN B12) 1000 MCG/ML injection INJECT 1 ML (1,000 MCG) INTRAMUSCULARLY EVERY 30 DAYS 9 mL 1   diazepam (VALIUM) 10 MG tablet Take 0.5-1 tablets  (5-10 mg total) by mouth every 12 (twelve) hours as needed for anxiety. 60 tablet 5   DILT-XR 180 MG 24 hr capsule TAKE 1 CAPSULE BY MOUTH EVERY DAY 90 capsule 3   diltiazem (CARDIZEM) 30 MG tablet Take 1 tablet every 4 hours AS NEEDED for AFIB heart rate >100 45 tablet 1   ELIQUIS 5 MG TABS tablet TAKE 1 TABLET BY MOUTH TWICE A DAY 180 tablet 3   ibuprofen (ADVIL) 800 MG tablet Take 1 tablet (800 mg total) by mouth every 8 (eight) hours as needed. 30 tablet 0   lamoTRIgine (LAMICTAL) 150 MG tablet TAKE 2 TABLETS BY MOUTH AT BEDTIME 180 tablet 0   lisinopril (ZESTRIL) 20 MG tablet TAKE 1 TABLET BY MOUTH EVERY DAY 90 tablet 0   Magnesium 400 MG CAPS Take 400 mg by mouth at bedtime.     oxyCODONE (OXY IR/ROXICODONE) 5 MG immediate release tablet Take 1 tablet (5 mg total) by mouth every 6 (six) hours as needed for severe pain. 15 tablet 0   risperiDONE (RISPERDAL) 3 MG tablet Take 1 tablet (3 mg total) by mouth at bedtime. 90 tablet 1   traZODone (DESYREL) 50 MG tablet TAKE 1 TO 2 TABLETS BY MOUTH AT BEDTIME AS NEEDED FOR SLEEP 180 tablet 0   No current facility-administered medications for this encounter.     REVIEW OF SYSTEMS: On review of systems, the patient reports that she is doing great without any significant breast pain or other verbalized concerns.      PHYSICAL EXAM:  Wt Readings from Last 3 Encounters:  11/30/22 175 lb 8 oz (79.6 kg)  11/06/22 176 lb 8 oz (80.1 kg)  11/02/22 173 lb 11.6 oz (78.8 kg)   Temp Readings from Last 3 Encounters:  11/30/22 (!) 96.3 F (35.7 C) (Temporal)  11/06/22 98.1 F (36.7 C) (Oral)  11/02/22 97.8 F (36.6 C)   BP Readings from Last 3 Encounters:  11/30/22 (!) 151/92  11/06/22 130/80  11/02/22 (!) 158/94   Pulse Readings from Last 3 Encounters:  11/30/22 75  11/06/22 (!) 115  11/02/22 90    In general this is a well appearing caucasian female in no acute distress. She's alert and oriented x4 and appropriate throughout the  examination. Cardiopulmonary assessment is negative for acute distress and she exhibits normal effort. The left breast has a well healed surgical incision site as seen below without separation, or drainage. The majority of the lateral breast is slightly erythematous and warm to the touch without punctate findings or deep fluctuance. A border outlining the majority is noted below as well.     ECOG = 1  0 - Asymptomatic (Fully active, able to carry on all predisease activities without restriction)  1 -  Symptomatic but completely ambulatory (Restricted in physically strenuous activity but ambulatory and able to carry out work of a light or sedentary nature. For example, light housework, office work)  2 - Symptomatic, <50% in bed during the day (Ambulatory and capable of all self care but unable to carry out any work activities. Up and about more than 50% of waking hours)  3 - Symptomatic, >50% in bed, but not bedbound (Capable of only limited self-care, confined to bed or chair 50% or more of waking hours)  4 - Bedbound (Completely disabled. Cannot carry on any self-care. Totally confined to bed or chair)  5 - Death   Eustace Pen MM, Creech RH, Tormey DC, et al. [phone]). "Toxicity and response criteria of the [facility]". [place] Oncol. 5 (6): 649-55    LABORATORY DATA:  Lab Results  Component Value Date   WBC 5.2 11/06/2022   HGB 13.8 11/06/2022   HCT 40.8 11/06/2022   MCV 98.8 11/06/2022   PLT 251.0 11/06/2022   Lab Results  Component Value Date   NA 135 11/06/2022   K 4.2 11/06/2022   CL 95 (L) 11/06/2022   CO2 30 11/06/2022   Lab Results  Component Value Date   ALT 60 (H) 11/06/2022   AST 59 (H) 11/06/2022   ALKPHOS 73 11/06/2022   BILITOT 0.5 11/06/2022      RADIOGRAPHY: MM Breast Surgical Specimen  Result Date: 11/02/2022 CLINICAL DATA:  Evaluate surgical specimen following lumpectomy for LEFT breast cancer. EXAM: SPECIMEN RADIOGRAPH OF THE  LEFT BREAST COMPARISON:  Previous exam(s). FINDINGS: Status post excision of the LEFT breast. The radioactive seed and RIBBON biopsy marker clip are present and completely intact. IMPRESSION: Specimen radiograph of the LEFT breast. Electronically Signed   By: Margarette Canada M.D.   On: 11/02/2022 12:57  MM LT RADIOACTIVE SEED LOC MAMMO GUIDE  Result Date: 10/31/2022 CLINICAL DATA:  Patient presents for seed localization prior to lumpectomy of the LEFT breast. EXAM: MAMMOGRAPHIC GUIDED RADIOACTIVE SEED LOCALIZATION OF THE LEFT BREAST COMPARISON:  Previous exam(s). FINDINGS: Patient presents for radioactive seed localization prior to lumpectomy. I met with the patient and we discussed the procedure of seed localization including benefits and alternatives. We discussed the high likelihood of a successful procedure. We discussed the risks of the procedure including infection, bleeding, tissue injury and further surgery. We discussed the low dose of radioactivity involved in the procedure. Informed, written consent was given. Prior to seed placement, a two view mammogram was performed, showing near complete resolution of the hematoma in the LATERAL portion of the LEFT breast. The usual time-out protocol was performed immediately prior to the procedure. Using mammographic guidance, sterile technique, 1% lidocaine and an I-125 radioactive seed, the ribbon shaped clip in the UPPER-OUTER QUADRANT of the LEFT breast was localized using a LATERAL approach. The follow-up mammogram images confirm the seed in the expected location and were marked for Dr. Brantley Stage. Follow-up survey of the patient confirms presence of the radioactive seed. Order number of I-125 seed:  [id number]. Total activity:  2.992 millicuries reference Date: 10/17/2022 The patient tolerated the procedure well and was released from the [facility]. She was given instructions regarding seed removal. IMPRESSION: Radioactive seed localization of the LEFT breast.  No apparent complications. Electronically Signed   By: Nolon Nations M.D.   On: 10/31/2022 13:48      IMPRESSION/PLAN: 1. 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 Dr. Lisbeth Renshaw  has reviewed the final pathology findings and the patient and I reviewed the nature of early stage left breast disease. She is doing well since surgery and sees Dr. Brantley Stage tomorrow. I will go ahead and start her on antibiotics for what appears to be early cellulitis. I was able to confirm with Dr. Ernestina Penna team that there are no plans for chemotherapy. Dr. Lisbeth Renshaw recommends external radiotherapy to the breast  to reduce risks of local recurrence followed by antiestrogen therapy. We discussed the risks, benefits, short, and long term effects of radiotherapy, as well as the curative intent, and the patient is interested in proceeding. I reviewed the delivery and logistics of radiotherapy and that Dr. Lisbeth Renshaw recommends a course of 4  weeks of radiotherapy to the left breast with deep inspiration breath hold technique.Written consent is obtained and placed in the chart, a copy was provided to the patient. She will simulate tomorrow.   In a visit lasting 45 minutes, greater than 50% of the time was spent face to face reviewing her case, as well as in preparation of, discussing, and coordinating the patient's care.      Carola Rhine, [facility]    **Disclaimer: This note was dictated with voice recognition software. Similar sounding words can inadvertently be transcribed and this note may contain transcription errors which may not have been corrected upon publication of note.**

## 2022-11-30 NOTE — Progress Notes (Signed)
Nursing interview for Malignant neoplasm of upper-outer quadrant of left breast in female, estrogen receptor positive ([place]).  I verified patient's identity and began nursing interview. Patient reports doing well.  Meaningful use complete. NO chances of pregnancy.  BP (!) 151/92 (BP Location: Right Arm, Patient Position: Sitting, Cuff Size: Normal)   Pulse 75   Temp (!) 96.3 F (35.7 C) (Temporal)   Resp 18   Ht '5\' 4"'$  (1.626 m)   Wt 175 lb 8 oz (79.6 kg)   SpO2 100%   BMI 30.12 kg/m   This concludes the interview.   Leandra Kern, LPN

## 2022-12-01 ENCOUNTER — Ambulatory Visit
Admission: RE | Admit: 2022-12-01 | Discharge: 2022-12-01 | Disposition: A | Discharge status: Home / Self Care | Payer: PPO | Source: Ambulatory Visit | Attending: Radiation Oncology | Admitting: Radiation Oncology

## 2022-12-01 ENCOUNTER — Other Ambulatory Visit: Payer: Self-pay

## 2022-12-01 DIAGNOSIS — C50412 Malignant neoplasm of upper-outer quadrant of left female breast: Secondary | ICD-10-CM | POA: Diagnosis not present

## 2022-12-01 DIAGNOSIS — Z170 Estrogen receptor positive status [ER+]: Secondary | ICD-10-CM | POA: Diagnosis not present

## 2022-12-01 DIAGNOSIS — Z510 Encounter for antineoplastic radiation therapy: Secondary | ICD-10-CM | POA: Diagnosis not present

## 2022-12-04 ENCOUNTER — Ambulatory Visit (INDEPENDENT_AMBULATORY_CARE_PROVIDER_SITE_OTHER): Payer: PPO | Admitting: Physician Assistant

## 2022-12-04 ENCOUNTER — Encounter: Payer: Self-pay | Admitting: Physician Assistant

## 2022-12-04 DIAGNOSIS — E538 Deficiency of other specified B group vitamins: Secondary | ICD-10-CM | POA: Diagnosis not present

## 2022-12-04 DIAGNOSIS — F1010 Alcohol abuse, uncomplicated: Secondary | ICD-10-CM | POA: Diagnosis not present

## 2022-12-04 DIAGNOSIS — C50412 Malignant neoplasm of upper-outer quadrant of left female breast: Secondary | ICD-10-CM

## 2022-12-04 DIAGNOSIS — F411 Generalized anxiety disorder: Secondary | ICD-10-CM | POA: Diagnosis not present

## 2022-12-04 DIAGNOSIS — Z170 Estrogen receptor positive status [ER+]: Secondary | ICD-10-CM

## 2022-12-04 DIAGNOSIS — I482 Chronic atrial fibrillation, unspecified: Secondary | ICD-10-CM

## 2022-12-04 DIAGNOSIS — F319 Bipolar disorder, unspecified: Secondary | ICD-10-CM

## 2022-12-04 DIAGNOSIS — G4700 Insomnia, unspecified: Secondary | ICD-10-CM

## 2022-12-04 MED ORDER — RISPERIDONE 3 MG PO TABS: 3.0000 mg | ORAL_TABLET | Freq: Every day | ORAL | 1 refills | Status: DC

## 2022-12-04 MED ORDER — CHLORDIAZEPOXIDE HCL 25 MG PO CAPS: ORAL_CAPSULE | ORAL | 0 refills | Status: DC

## 2022-12-04 MED ORDER — BUSPIRONE HCL 15 MG PO TABS: 15.0000 mg | ORAL_TABLET | Freq: Every day | ORAL | 0 refills | Status: DC

## 2022-12-04 MED ORDER — ACAMPROSATE CALCIUM 333 MG PO TBEC: 666.0000 mg | DELAYED_RELEASE_TABLET | Freq: Three times a day (TID) | ORAL | 1 refills | Status: DC

## 2022-12-04 MED ORDER — TRAZODONE HCL 50 MG PO TABS: 50.0000 mg | ORAL_TABLET | Freq: Every evening | ORAL | 0 refills | Status: DC | PRN

## 2022-12-04 NOTE — Progress Notes (Signed)
[facility]  Patient ID: Margaret Mckenzie,  MRN: [id number]  PCP: Isaac Bliss, Rayford Halsted, MD  Date of Evaluation: 12/04/2022 Time spent:30 minutes  Chief Complaint:  Chief Complaint   Anxiety; Depression; Alcohol Problem; Insomnia; Follow-up    HISTORY/CURRENT STATUS: HPI  For routine med check.  Breast cancer dx in Oct. Had lumpectomy last month. Will have about a month of XRT and that's all. Very happy about that.   Started drinking heavier a few mo ago. Now at 1 regular size bottle of wine per day. Doesn't want to depend on it to help w/ anxiety. Gets panicky if doesn't drink. Not craving the alcohol per se, just needs to relax. Worries about work, is a Programme researcher, broadcasting/film/video at [employer]. Several other workers are helping her with taking orders and carrying food to tables b/c hands shake uncontrollably sometimes.   Doesn't feel depressed. Not particularly worried about the breast cancer. [place]. Energy and motivation are good.   No extreme sadness, tearfulness, or feelings of hopelessness.  Sleeps well most of the time. ADLs and personal hygiene are normal.   Denies any changes in concentration, making decisions, or remembering things.  Appetite has not changed.  Weight is stable.   Denies suicidal or homicidal thoughts.  Patient denies increased energy with decreased need for sleep, increased talkativeness, racing thoughts, impulsivity or risky behaviors, increased spending, increased libido, grandiosity, increased irritability or anger, paranoia, or hallucinations.  Denies dizziness, syncope, seizures, numbness, tingling, tics, unsteady gait, slurred speech, confusion. Denies muscle or joint pain, stiffness, or dystonia.  Individual Medical History/ Review of Systems: Changes? :No      Past medications for mental health diagnoses include: Depakote, Lamictal, Prozac, Lexapro, Xanax, Sonata caused amnesia, Risperdal, Trazodone ineffective at low doses but caused  dizziness at higher doses.  Allergies: Soap  Current Medications:  Current Outpatient Medications:    acamprosate (CAMPRAL) 333 MG tablet, Take 2 tablets (666 mg total) by mouth 3 (three) times daily with meals., Disp: 180 tablet, Rfl: 1   Ascorbic Acid (VITAMIN C) 1000 MG tablet, Take 1,000 mg by mouth at bedtime., Disp: , Rfl:    Calcium Carbonate (CALCIUM 500 PO), Take 1 tablet by mouth at bedtime., Disp: , Rfl:    Calcium Carbonate (CALCIUM 600 PO), Take 1,200 mg by mouth at bedtime., Disp: , Rfl:    chlordiazePOXIDE (LIBRIUM) 25 MG capsule, 2 p.o. 4 times daily for 2 days, then 2 p.o. 3 times daily for 2 days, then 1 p.o. 4 times daily for 2 days, then 1 p.o. 3 times daily for 2 days, then 1 p.o. twice daily for 2 days and then stop., Disp: 46 capsule, Rfl: 0   Cholecalciferol (VITAMIN D-3) 25 MCG (1000 UT) CAPS, Take 4,000 Units by mouth at bedtime., Disp: , Rfl:    cyanocobalamin (VITAMIN B12) 1000 MCG/ML injection, INJECT 1 ML (1,000 MCG) INTRAMUSCULARLY EVERY 30 DAYS, Disp: 9 mL, Rfl: 1   diltiazem (CARDIZEM) 30 MG tablet, Take 1 tablet every 4 hours AS NEEDED for AFIB heart rate >100, Disp: 45 tablet, Rfl: 1   doxycycline (VIBRA-TABS) 100 MG tablet, Take 1 tablet (100 mg total) by mouth 2 (two) times daily., Disp: 20 tablet, Rfl: 0   ELIQUIS 5 MG TABS tablet, TAKE 1 TABLET BY MOUTH TWICE A DAY, Disp: 180 tablet, Rfl: 3   ibuprofen (ADVIL) 800 MG tablet, Take 1 tablet (800 mg total) by mouth every 8 (eight) hours as needed., Disp: 30 tablet, Rfl: 0  lamoTRIgine (LAMICTAL) 150 MG tablet, TAKE 2 TABLETS BY MOUTH AT BEDTIME, Disp: 180 tablet, Rfl: 0   lisinopril (ZESTRIL) 20 MG tablet, TAKE 1 TABLET BY MOUTH EVERY DAY, Disp: 90 tablet, Rfl: 0   Magnesium 400 MG CAPS, Take 400 mg by mouth at bedtime., Disp: , Rfl:    busPIRone (BUSPAR) 15 MG tablet, Take 1 tablet (15 mg total) by mouth daily., Disp: 90 tablet, Rfl: 0   DILT-XR 180 MG 24 hr capsule, TAKE 1 CAPSULE BY MOUTH EVERY DAY, Disp:  90 capsule, Rfl: 3   oxyCODONE (OXY IR/ROXICODONE) 5 MG immediate release tablet, Take 1 tablet (5 mg total) by mouth every 6 (six) hours as needed for severe pain. (Patient not taking: Reported on 12/04/2022), Disp: 15 tablet, Rfl: 0   risperiDONE (RISPERDAL) 3 MG tablet, Take 1 tablet (3 mg total) by mouth at bedtime., Disp: 90 tablet, Rfl: 1   traZODone (DESYREL) 50 MG tablet, Take 1-2 tablets (50-100 mg total) by mouth at bedtime as needed. for sleep, Disp: 180 tablet, Rfl: 0 Medication Side Effects: none  Family Medical/ Social History: Changes?  No  MENTAL HEALTH EXAM:  There were no vitals taken for this visit.There is no height or weight on file to calculate BMI.  General Appearance: Casual, Neat, and Well Groomed  Eye Contact:  Good  Speech:  Clear and Coherent and Normal Rate  Volume:  Normal  Mood:  Euthymic  Affect:  Appropriate  Thought Process:  Goal Directed and Descriptions of Associations: Circumstantial  Orientation:  Full (Time, Place, and Person)  Thought Content: Logical   Suicidal Thoughts:  No  Homicidal Thoughts:  No  Memory:  WNL  Judgement:  Good  Insight:  Good  Psychomotor Activity:  Tremor and of oustretched arms, in hands bilat. Mild to moderate  Concentration:  Concentration: Good and Attention Span: Good  Recall:  Good  Fund of Knowledge: Good  Language: Good  Assets:  Desire for Improvement  ADL's:  Intact  Cognition: WNL  Prognosis:  Good    DIAGNOSES:    ICD-10-CM   1. Bipolar I disorder ([place])  F31.9     2. Alcohol abuse  F10.10     3. Generalized anxiety disorder  F41.1     4. Insomnia, unspecified type  G47.00     5. Malignant neoplasm of upper-outer quadrant of left breast in female, estrogen receptor positive ([place])  C50.412    Z17.0     6. Vitamin B12 deficiency  E53.8     7. Chronic atrial fibrillation ([facility])  I48.20       Receiving Psychotherapy: Yes   Matt Schooler  RECOMMENDATIONS:  PDMP was reviewed.  Last Valium  filled 09/15/2022.   I provided 30 minutes of face to face time during this encounter, including time spent before and after the visit in records review, medical decision making, counseling pertinent to today's visit, and charting.   Discussed the ETOH, recommend Campral to help any cravings. Also Librium detox. Benefits, risks, SE disc and she would like to proceed with this plan.   D/C valium. Start Campral 333 mg, 2 po tid.  Continue BuSpar 15 mg, 1 p.o. daily. (Needing a RF earlier than expected  Star Libium as per med list.   Continue Lamictal 150 mg, 2 p.o. nightly. Continue Risperdal 3 mg, 1 p.o. nightly. Continue trazodone 50 mg, 1/2-2 p.o. nightly as needed sleep. Continue therapy with Elias Else.  Return in 3 weeks.   Donnal Moat,  PA-C

## 2022-12-08 ENCOUNTER — Ambulatory Visit (HOSPITAL_BASED_OUTPATIENT_CLINIC_OR_DEPARTMENT_OTHER)
Admission: RE | Admit: 2022-12-08 | Discharge: 2022-12-08 | Disposition: A | Discharge status: Home / Self Care | Payer: PPO | Source: Ambulatory Visit | Attending: Internal Medicine | Admitting: Internal Medicine

## 2022-12-08 DIAGNOSIS — R7401 Elevation of levels of liver transaminase levels: Secondary | ICD-10-CM | POA: Diagnosis not present

## 2022-12-08 DIAGNOSIS — R748 Abnormal levels of other serum enzymes: Secondary | ICD-10-CM | POA: Diagnosis not present

## 2022-12-11 ENCOUNTER — Encounter: Payer: Self-pay | Admitting: Internal Medicine

## 2022-12-11 ENCOUNTER — Telehealth: Payer: Self-pay | Admitting: Physician Assistant

## 2022-12-11 ENCOUNTER — Other Ambulatory Visit: Payer: Self-pay | Admitting: *Deleted

## 2022-12-11 DIAGNOSIS — Z510 Encounter for antineoplastic radiation therapy: Secondary | ICD-10-CM | POA: Diagnosis not present

## 2022-12-11 DIAGNOSIS — C50412 Malignant neoplasm of upper-outer quadrant of left female breast: Secondary | ICD-10-CM | POA: Diagnosis not present

## 2022-12-11 DIAGNOSIS — K7581 Nonalcoholic steatohepatitis (NASH): Secondary | ICD-10-CM | POA: Insufficient documentation

## 2022-12-11 DIAGNOSIS — R7401 Elevation of levels of liver transaminase levels: Secondary | ICD-10-CM

## 2022-12-11 DIAGNOSIS — Z170 Estrogen receptor positive status [ER+]: Secondary | ICD-10-CM | POA: Diagnosis not present

## 2022-12-11 NOTE — Telephone Encounter (Signed)
Pt has been taking campral 2 tabs TID and weaning off of librium.She is unable to walk,she stated she walks like a drunken sailor.She is off balance and leaning to the right side more.She needs help to get in her house and is unable to work.She stated financially she has to be able to work.

## 2022-12-11 NOTE — Telephone Encounter (Signed)
Pt informed

## 2022-12-11 NOTE — Telephone Encounter (Signed)
Next visit is 02/09/23. Tona called and said that she was put on two new medications last week and she can't walk. She has had to leave work and it takes two people to help her walk. Please call her at [phone].

## 2022-12-11 NOTE — Telephone Encounter (Signed)
Recommend she go to ER d/t physical sx. They may or may not be related to new meds. Also want to get her off both new meds.  On the Librium, have her take 1 po bid for 1 day, then 1 daily for 1 day, then stop. Make sure she has completely stopped alcohol too, as that will make the balance problems worse.  Stop Campral. Make sure she's taking Thiamine, OTC.

## 2022-12-12 ENCOUNTER — Other Ambulatory Visit: Payer: Self-pay

## 2022-12-12 ENCOUNTER — Ambulatory Visit
Admission: RE | Admit: 2022-12-12 | Discharge: 2022-12-12 | Disposition: A | Discharge status: Home / Self Care | Payer: PPO | Source: Ambulatory Visit | Attending: Radiation Oncology | Admitting: Radiation Oncology

## 2022-12-12 DIAGNOSIS — Z170 Estrogen receptor positive status [ER+]: Secondary | ICD-10-CM | POA: Diagnosis not present

## 2022-12-12 DIAGNOSIS — C50412 Malignant neoplasm of upper-outer quadrant of left female breast: Secondary | ICD-10-CM | POA: Diagnosis not present

## 2022-12-12 DIAGNOSIS — Z510 Encounter for antineoplastic radiation therapy: Secondary | ICD-10-CM | POA: Diagnosis not present

## 2022-12-12 LAB — RAD ONC ARIA SESSION SUMMARY
Course Elapsed Days: 0
Plan Fractions Treated to Date: 1
Plan Prescribed Dose Per Fraction: 2.66 Gy
Plan Total Fractions Prescribed: 16
Plan Total Prescribed Dose: 42.56 Gy
Reference Point Dosage Given to Date: 2.66 Gy
Reference Point Session Dosage Given: 2.66 Gy
Session Number: 1

## 2022-12-13 ENCOUNTER — Encounter: Payer: Self-pay | Admitting: Internal Medicine

## 2022-12-13 ENCOUNTER — Ambulatory Visit (INDEPENDENT_AMBULATORY_CARE_PROVIDER_SITE_OTHER): Payer: PPO | Admitting: Internal Medicine

## 2022-12-13 ENCOUNTER — Other Ambulatory Visit: Payer: Self-pay

## 2022-12-13 ENCOUNTER — Ambulatory Visit
Admission: RE | Admit: 2022-12-13 | Discharge: 2022-12-13 | Disposition: A | Discharge status: Home / Self Care | Payer: PPO | Source: Ambulatory Visit | Attending: Radiation Oncology | Admitting: Radiation Oncology

## 2022-12-13 VITALS — BP 135/89 | HR 88 | Ht 63.5 in | Wt 170.0 lb

## 2022-12-13 DIAGNOSIS — Z170 Estrogen receptor positive status [ER+]: Secondary | ICD-10-CM | POA: Diagnosis not present

## 2022-12-13 DIAGNOSIS — R531 Weakness: Secondary | ICD-10-CM | POA: Diagnosis not present

## 2022-12-13 DIAGNOSIS — I10 Essential (primary) hypertension: Secondary | ICD-10-CM

## 2022-12-13 DIAGNOSIS — Z0000 Encounter for general adult medical examination without abnormal findings: Secondary | ICD-10-CM | POA: Diagnosis not present

## 2022-12-13 DIAGNOSIS — Z13820 Encounter for screening for osteoporosis: Secondary | ICD-10-CM

## 2022-12-13 DIAGNOSIS — E871 Hypo-osmolality and hyponatremia: Secondary | ICD-10-CM

## 2022-12-13 DIAGNOSIS — C50412 Malignant neoplasm of upper-outer quadrant of left female breast: Secondary | ICD-10-CM | POA: Diagnosis not present

## 2022-12-13 DIAGNOSIS — Z122 Encounter for screening for malignant neoplasm of respiratory organs: Secondary | ICD-10-CM

## 2022-12-13 DIAGNOSIS — Z510 Encounter for antineoplastic radiation therapy: Secondary | ICD-10-CM | POA: Diagnosis not present

## 2022-12-13 LAB — RAD ONC ARIA SESSION SUMMARY
Course Elapsed Days: 1
Plan Fractions Treated to Date: 2
Plan Prescribed Dose Per Fraction: 2.66 Gy
Plan Total Fractions Prescribed: 16
Plan Total Prescribed Dose: 42.56 Gy
Reference Point Dosage Given to Date: 5.32 Gy
Reference Point Session Dosage Given: 2.66 Gy
Session Number: 2

## 2022-12-13 LAB — BASIC METABOLIC PANEL
BUN: 5 mg/dL — ABNORMAL LOW (ref 6–23)
CO2: 31 mEq/L (ref 19–32)
Calcium: 9.6 mg/dL (ref 8.4–10.5)
Chloride: 98 mEq/L (ref 96–112)
Creatinine, Ser: 0.67 mg/dL (ref 0.40–1.20)
GFR: 88.43 mL/min (ref >60.00)
Glucose, Bld: 96 mg/dL (ref 70–99)
Potassium: 3.7 mEq/L (ref 3.5–5.1)
Sodium: 136 mEq/L (ref 135–145)

## 2022-12-13 NOTE — Progress Notes (Signed)
Established Patient Office Visit     CC/Reason for Visit: Annual preventive exam, subsequent Medicare wellness visit, discuss acute concern  HPI: Margaret Mckenzie is a 72 y.o. female who is coming in today for the above mentioned reasons. Past Medical History is significant for: Hypertension, A-fib, bipolar disorder, recent left breast cancer diagnosis currently undergoing radiation.  Her psychiatrist recently started her on Campral and Librium due to increased alcohol use.  She was noted to have increased LFTs on lab work in December.  Shortly after this she started having weakness and balance issues that lasted 48 hours.  She has been hospitalized in the past for hyponatremia.  She is otherwise doing well.  She has routine eye and dental care, no perceived hearing issues, she is overdue for RSV and COVID vaccinations.  All cancer screening is up-to-date.   Past Medical/Surgical History: Past Medical History:  Diagnosis Date   Anxiety    Bipolar 1 disorder ([place])    Chicken pox    Depression    Hypertension    high blood pressure readings    Kidney disease    kidney stones and saw urologist, Dr. Edward Qualia for lithotripsy remotely    PAF (paroxysmal atrial fibrillation) [facility])     Past Surgical History:  Procedure Laterality Date   BREAST BIOPSY  10/31/2022   MM LT RADIOACTIVE SEED LOC MAMMO GUIDE 10/31/2022 [facility] MAMMOGRAPHY   BREAST LUMPECTOMY WITH RADIOACTIVE SEED LOCALIZATION Left 11/02/2022   Procedure: LEFT BREAST LUMPECTOMY WITH RADIOACTIVE SEED LOCALIZATION;  Surgeon: Erroll Luna, MD;  Location: [facility];  Service: General;  Laterality: Left;   BUNIONECTOMY  2007   TONSILLECTOMY AND ADENOIDECTOMY  1975    Social History:  reports that she quit smoking about 5 years ago. Her smoking use included cigarettes. She has a 20.00 pack-year smoking history. She has never used smokeless tobacco. She reports current alcohol use. She reports that she does  not use drugs.  Allergies: Allergies  Allergen Reactions   Soap Rash    Patients states soap at work drys out hands and has to use special cream to sooth skin.    Family History:  Family History  Problem Relation Age of Onset   Osteoporosis Mother    Stroke Mother        TIA, stoke   Mental retardation Mother    Hypertension Father    Heart disease Father 58   Stroke Father 19   Mental illness Sister    Breast cancer Neg Hx      Current Outpatient Medications:    Ascorbic Acid (VITAMIN C) 1000 MG tablet, Take 1,000 mg by mouth at bedtime., Disp: , Rfl:    busPIRone (BUSPAR) 15 MG tablet, Take 1 tablet (15 mg total) by mouth daily., Disp: 90 tablet, Rfl: 0   Calcium Carbonate (CALCIUM 500 PO), Take 1 tablet by mouth at bedtime., Disp: , Rfl:    Calcium Carbonate (CALCIUM 600 PO), Take 1,200 mg by mouth at bedtime., Disp: , Rfl:    Cholecalciferol (VITAMIN D-3) 25 MCG (1000 UT) CAPS, Take 4,000 Units by mouth at bedtime., Disp: , Rfl:    cyanocobalamin (VITAMIN B12) 1000 MCG/ML injection, INJECT 1 ML (1,000 MCG) INTRAMUSCULARLY EVERY 30 DAYS, Disp: 9 mL, Rfl: 1   DILT-XR 180 MG 24 hr capsule, TAKE 1 CAPSULE BY MOUTH EVERY DAY, Disp: 90 capsule, Rfl: 3   diltiazem (CARDIZEM) 30 MG tablet, Take 1 tablet every 4 hours AS NEEDED for  AFIB heart rate >100, Disp: 45 tablet, Rfl: 1   doxycycline (VIBRA-TABS) 100 MG tablet, Take 1 tablet (100 mg total) by mouth 2 (two) times daily., Disp: 20 tablet, Rfl: 0   ELIQUIS 5 MG TABS tablet, TAKE 1 TABLET BY MOUTH TWICE A DAY, Disp: 180 tablet, Rfl: 3   ibuprofen (ADVIL) 800 MG tablet, Take 1 tablet (800 mg total) by mouth every 8 (eight) hours as needed., Disp: 30 tablet, Rfl: 0   lamoTRIgine (LAMICTAL) 150 MG tablet, TAKE 2 TABLETS BY MOUTH AT BEDTIME, Disp: 180 tablet, Rfl: 0   lisinopril (ZESTRIL) 20 MG tablet, TAKE 1 TABLET BY MOUTH EVERY DAY, Disp: 90 tablet, Rfl: 0   Magnesium 400 MG CAPS, Take 400 mg by mouth at bedtime., Disp: , Rfl:     oxyCODONE (OXY IR/ROXICODONE) 5 MG immediate release tablet, Take 1 tablet (5 mg total) by mouth every 6 (six) hours as needed for severe pain., Disp: 15 tablet, Rfl: 0   risperiDONE (RISPERDAL) 3 MG tablet, Take 1 tablet (3 mg total) by mouth at bedtime., Disp: 90 tablet, Rfl: 1   traZODone (DESYREL) 50 MG tablet, Take 1-2 tablets (50-100 mg total) by mouth at bedtime as needed. for sleep, Disp: 180 tablet, Rfl: 0  Review of Systems:  Negative unless indicated in HPI.   Physical Exam: Vitals:   12/13/22 1257 12/13/22 1300  BP: (!) 144/88 135/89  Pulse: 88   SpO2: 99%   Weight: 170 lb (77.1 kg)   Height: 5' 3.5" (1.613 m)     Body mass index is 29.64 kg/m.   Physical Exam Vitals reviewed.  Constitutional:      General: She is not in acute distress.    Appearance: Normal appearance. She is not ill-appearing, toxic-appearing or diaphoretic.  HENT:     Head: Normocephalic.     Right Ear: Tympanic membrane, ear canal and external ear normal. There is no impacted cerumen.     Left Ear: Tympanic membrane, ear canal and external ear normal. There is no impacted cerumen.     Nose: Nose normal.     Mouth/Throat:     Mouth: Mucous membranes are moist.     Pharynx: Oropharynx is clear. No oropharyngeal exudate or posterior oropharyngeal erythema.  Eyes:     General: No scleral icterus.       Right eye: No discharge.        Left eye: No discharge.     Conjunctiva/sclera: Conjunctivae normal.     Pupils: Pupils are equal, round, and reactive to light.  Neck:     Vascular: No carotid bruit.  Cardiovascular:     Rate and Rhythm: Normal rate and regular rhythm.     Pulses: Normal pulses.     Heart sounds: Normal heart sounds.  Pulmonary:     Effort: Pulmonary effort is normal. No respiratory distress.     Breath sounds: Normal breath sounds.  Abdominal:     General: Abdomen is flat. Bowel sounds are normal.     Palpations: Abdomen is soft.  Musculoskeletal:        General:  Normal range of motion.     Cervical back: Normal range of motion.  Skin:    General: Skin is warm and dry.     Capillary Refill: Capillary refill takes less than 2 seconds.  Neurological:     General: No focal deficit present.     Mental Status: She is alert and oriented to person, place, and time. Mental  status is at baseline.  Psychiatric:        Mood and Affect: Mood normal.        Behavior: Behavior normal.        Thought Content: Thought content normal.        Judgment: Judgment normal.       Subsequent Medicare wellness visit   1. Risk factors, based on past  M,S,F -cardiovascular disease risk factors include age, obesity, history of hypertension   2.  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  Depression/mood: History of bipolar disorder   4.  Hearing: No perceived issues   5.  ADL's: Independent in all ADLs   6.  Fall risk: Low fall risk   7.  Home safety: No problems identified   8.  Height weight, and visual acuity: height and weight as above, vision:  Vision is 20/25 with each eye independently and together   9.  Counseling: Discussed alcohol use, advised to update age-appropriate immunizations   10. Lab orders based on risk factors: Laboratory update will be reviewed   11. Referral : None today   12. Care plan: Follow-up with me in 6 months   13. Cognitive assessment: No cognitive impairment   14. Screening: Patient provided with a written and personalized 5-10 year screening schedule in the AVS. yes   15. Provider List Update: PCP, oncologist, radiation oncologist  32. Advance Directives: Full code   17. Opioids: Patient is not on any opioid prescriptions and has no risk factors for a substance use disorder.   [facility] Office Visit from 12/13/2022 in [place] at [place]  PHQ-9 Total Score 0          10/09/2022    3:02 PM 11/02/2022   10:59 AM 11/06/2022    8:55 AM 11/30/2022     1:02 PM 12/13/2022    1:10 PM  Fall Risk  Falls in the past year? 1  0  0  Was there an injury with Fall? 0  0  0  Fall Risk Category Calculator 2  0  0  Fall Risk Category (Retired) Moderate  Low    (RETIRED) Patient Fall Risk Level Moderate fall risk Low fall risk Low fall risk Low fall risk   Patient at Risk for Falls Due to Impaired balance/gait  No Fall Risks  No Fall Risks  Fall risk Follow up Falls evaluation completed  Falls evaluation completed  Falls evaluation completed    Impression and Plan:  Screening for osteoporosis - Plan: DG Bone Density  Encounter for preventive health examination  Weakness - Plan: Basic metabolic panel, Basic metabolic panel  Essential hypertension  Hyponatremia - Plan: Basic metabolic panel, Basic metabolic panel  Screening for lung cancer - Plan: Ambulatory Referral Lung Cancer Screening [facility]  -Recommend routine eye and dental care. -Immunizations: Advised to update COVID and RSV at pharmacy, all other immunizations are up-to-date -Healthy lifestyle discussed in detail. -Labs to be updated today. -Colon cancer screening: 01/2018 -Breast cancer screening: Current diagnosis of breast cancer undergoing radiation -Cervical cancer screening: 06/2021 -Lung cancer screening: Referred for lung cancer screening due to history of smoking -Prostate cancer screening: Not applicable -DEXA: 07/9241  -Due to her weakness and history of hyponatremia, BMP today.      [name], MD [facility] at [facility]

## 2022-12-14 ENCOUNTER — Encounter: Payer: Self-pay | Admitting: *Deleted

## 2022-12-14 ENCOUNTER — Encounter (HOSPITAL_COMMUNITY): Payer: Self-pay

## 2022-12-14 ENCOUNTER — Other Ambulatory Visit: Payer: Self-pay

## 2022-12-14 ENCOUNTER — Ambulatory Visit
Admission: RE | Admit: 2022-12-14 | Discharge: 2022-12-14 | Disposition: A | Discharge status: Home / Self Care | Payer: PPO | Source: Ambulatory Visit | Attending: Radiation Oncology | Admitting: Radiation Oncology

## 2022-12-14 DIAGNOSIS — Z170 Estrogen receptor positive status [ER+]: Secondary | ICD-10-CM | POA: Diagnosis not present

## 2022-12-14 DIAGNOSIS — C50412 Malignant neoplasm of upper-outer quadrant of left female breast: Secondary | ICD-10-CM | POA: Diagnosis not present

## 2022-12-14 DIAGNOSIS — Z510 Encounter for antineoplastic radiation therapy: Secondary | ICD-10-CM | POA: Diagnosis not present

## 2022-12-14 LAB — RAD ONC ARIA SESSION SUMMARY
Course Elapsed Days: 2
Plan Fractions Treated to Date: 3
Plan Prescribed Dose Per Fraction: 2.66 Gy
Plan Total Fractions Prescribed: 16
Plan Total Prescribed Dose: 42.56 Gy
Reference Point Dosage Given to Date: 7.98 Gy
Reference Point Session Dosage Given: 2.66 Gy
Session Number: 3

## 2022-12-15 ENCOUNTER — Other Ambulatory Visit: Payer: Self-pay

## 2022-12-15 ENCOUNTER — Ambulatory Visit
Admission: RE | Admit: 2022-12-15 | Discharge: 2022-12-15 | Disposition: A | Discharge status: Home / Self Care | Payer: PPO | Source: Ambulatory Visit | Attending: Radiation Oncology | Admitting: Radiation Oncology

## 2022-12-15 DIAGNOSIS — C50412 Malignant neoplasm of upper-outer quadrant of left female breast: Secondary | ICD-10-CM | POA: Diagnosis not present

## 2022-12-15 DIAGNOSIS — Z170 Estrogen receptor positive status [ER+]: Secondary | ICD-10-CM | POA: Diagnosis not present

## 2022-12-15 DIAGNOSIS — Z510 Encounter for antineoplastic radiation therapy: Secondary | ICD-10-CM | POA: Diagnosis not present

## 2022-12-15 LAB — RAD ONC ARIA SESSION SUMMARY
Course Elapsed Days: 3
Plan Fractions Treated to Date: 4
Plan Prescribed Dose Per Fraction: 2.66 Gy
Plan Total Fractions Prescribed: 16
Plan Total Prescribed Dose: 42.56 Gy
Reference Point Dosage Given to Date: 10.64 Gy
Reference Point Session Dosage Given: 2.66 Gy
Session Number: 4

## 2022-12-15 MED ORDER — ALRA NON-METALLIC DEODORANT (RAD-ONC)
1.0000 | Freq: Once | TOPICAL | Status: AC
  Administered 2022-12-15: 1 via TOPICAL

## 2022-12-15 MED ORDER — RADIAPLEXRX EX GEL: Freq: Once | CUTANEOUS | Status: AC

## 2022-12-18 ENCOUNTER — Ambulatory Visit
Admission: RE | Admit: 2022-12-18 | Discharge: 2022-12-18 | Disposition: A | Discharge status: Home / Self Care | Payer: PPO | Source: Ambulatory Visit | Attending: Radiation Oncology | Admitting: Radiation Oncology

## 2022-12-18 ENCOUNTER — Other Ambulatory Visit: Payer: Self-pay

## 2022-12-18 DIAGNOSIS — C50412 Malignant neoplasm of upper-outer quadrant of left female breast: Secondary | ICD-10-CM | POA: Diagnosis not present

## 2022-12-18 DIAGNOSIS — Z170 Estrogen receptor positive status [ER+]: Secondary | ICD-10-CM | POA: Diagnosis not present

## 2022-12-18 DIAGNOSIS — Z510 Encounter for antineoplastic radiation therapy: Secondary | ICD-10-CM | POA: Diagnosis not present

## 2022-12-18 LAB — RAD ONC ARIA SESSION SUMMARY
Course Elapsed Days: 6
Plan Fractions Treated to Date: 5
Plan Prescribed Dose Per Fraction: 2.66 Gy
Plan Total Fractions Prescribed: 16
Plan Total Prescribed Dose: 42.56 Gy
Reference Point Dosage Given to Date: 13.3 Gy
Reference Point Session Dosage Given: 2.66 Gy
Session Number: 5

## 2022-12-19 ENCOUNTER — Other Ambulatory Visit: Payer: Self-pay

## 2022-12-19 ENCOUNTER — Ambulatory Visit
Admission: RE | Admit: 2022-12-19 | Discharge: 2022-12-19 | Disposition: A | Discharge status: Home / Self Care | Payer: PPO | Source: Ambulatory Visit | Attending: Radiation Oncology | Admitting: Radiation Oncology

## 2022-12-19 DIAGNOSIS — Z170 Estrogen receptor positive status [ER+]: Secondary | ICD-10-CM | POA: Diagnosis not present

## 2022-12-19 DIAGNOSIS — C50412 Malignant neoplasm of upper-outer quadrant of left female breast: Secondary | ICD-10-CM | POA: Diagnosis not present

## 2022-12-19 DIAGNOSIS — Z510 Encounter for antineoplastic radiation therapy: Secondary | ICD-10-CM | POA: Diagnosis not present

## 2022-12-19 LAB — RAD ONC ARIA SESSION SUMMARY
Course Elapsed Days: 7
Plan Fractions Treated to Date: 6
Plan Prescribed Dose Per Fraction: 2.66 Gy
Plan Total Fractions Prescribed: 16
Plan Total Prescribed Dose: 42.56 Gy
Reference Point Dosage Given to Date: 15.96 Gy
Reference Point Session Dosage Given: 2.66 Gy
Session Number: 6

## 2022-12-20 ENCOUNTER — Ambulatory Visit
Admission: RE | Admit: 2022-12-20 | Discharge: 2022-12-20 | Disposition: A | Discharge status: Home / Self Care | Payer: PPO | Source: Ambulatory Visit | Attending: Radiation Oncology | Admitting: Radiation Oncology

## 2022-12-20 ENCOUNTER — Other Ambulatory Visit: Payer: Self-pay

## 2022-12-20 DIAGNOSIS — C50412 Malignant neoplasm of upper-outer quadrant of left female breast: Secondary | ICD-10-CM | POA: Diagnosis not present

## 2022-12-20 DIAGNOSIS — Z510 Encounter for antineoplastic radiation therapy: Secondary | ICD-10-CM | POA: Diagnosis not present

## 2022-12-20 DIAGNOSIS — Z170 Estrogen receptor positive status [ER+]: Secondary | ICD-10-CM | POA: Diagnosis not present

## 2022-12-20 LAB — RAD ONC ARIA SESSION SUMMARY
Course Elapsed Days: 8
Plan Fractions Treated to Date: 7
Plan Prescribed Dose Per Fraction: 2.66 Gy
Plan Total Fractions Prescribed: 16
Plan Total Prescribed Dose: 42.56 Gy
Reference Point Dosage Given to Date: 18.62 Gy
Reference Point Session Dosage Given: 2.66 Gy
Session Number: 7

## 2022-12-21 ENCOUNTER — Other Ambulatory Visit: Payer: Self-pay

## 2022-12-21 ENCOUNTER — Ambulatory Visit
Admission: RE | Admit: 2022-12-21 | Discharge: 2022-12-21 | Disposition: A | Discharge status: Home / Self Care | Payer: PPO | Source: Ambulatory Visit | Attending: Radiation Oncology | Admitting: Radiation Oncology

## 2022-12-21 DIAGNOSIS — Z510 Encounter for antineoplastic radiation therapy: Secondary | ICD-10-CM | POA: Diagnosis not present

## 2022-12-21 DIAGNOSIS — Z170 Estrogen receptor positive status [ER+]: Secondary | ICD-10-CM | POA: Diagnosis not present

## 2022-12-21 DIAGNOSIS — C50412 Malignant neoplasm of upper-outer quadrant of left female breast: Secondary | ICD-10-CM | POA: Diagnosis not present

## 2022-12-21 LAB — RAD ONC ARIA SESSION SUMMARY
Course Elapsed Days: 9
Plan Fractions Treated to Date: 8
Plan Prescribed Dose Per Fraction: 2.66 Gy
Plan Total Fractions Prescribed: 16
Plan Total Prescribed Dose: 42.56 Gy
Reference Point Dosage Given to Date: 21.28 Gy
Reference Point Session Dosage Given: 2.66 Gy
Session Number: 8

## 2022-12-22 ENCOUNTER — Other Ambulatory Visit: Payer: Self-pay

## 2022-12-22 ENCOUNTER — Ambulatory Visit
Admission: RE | Admit: 2022-12-22 | Discharge: 2022-12-22 | Disposition: A | Discharge status: Home / Self Care | Payer: PPO | Source: Ambulatory Visit | Attending: Radiation Oncology | Admitting: Radiation Oncology

## 2022-12-22 DIAGNOSIS — Z510 Encounter for antineoplastic radiation therapy: Secondary | ICD-10-CM | POA: Diagnosis not present

## 2022-12-22 DIAGNOSIS — C50412 Malignant neoplasm of upper-outer quadrant of left female breast: Secondary | ICD-10-CM | POA: Diagnosis not present

## 2022-12-22 DIAGNOSIS — Z170 Estrogen receptor positive status [ER+]: Secondary | ICD-10-CM | POA: Diagnosis not present

## 2022-12-22 LAB — RAD ONC ARIA SESSION SUMMARY
Course Elapsed Days: 10
Plan Fractions Treated to Date: 9
Plan Prescribed Dose Per Fraction: 2.66 Gy
Plan Total Fractions Prescribed: 16
Plan Total Prescribed Dose: 42.56 Gy
Reference Point Dosage Given to Date: 23.94 Gy
Reference Point Session Dosage Given: 2.66 Gy
Session Number: 9

## 2022-12-25 ENCOUNTER — Other Ambulatory Visit: Payer: Self-pay

## 2022-12-25 ENCOUNTER — Ambulatory Visit
Admission: RE | Admit: 2022-12-25 | Discharge: 2022-12-25 | Disposition: A | Discharge status: Home / Self Care | Payer: PPO | Source: Ambulatory Visit | Attending: Radiation Oncology | Admitting: Radiation Oncology

## 2022-12-25 ENCOUNTER — Other Ambulatory Visit: Payer: PPO

## 2022-12-25 DIAGNOSIS — Z510 Encounter for antineoplastic radiation therapy: Secondary | ICD-10-CM | POA: Diagnosis not present

## 2022-12-25 DIAGNOSIS — Z170 Estrogen receptor positive status [ER+]: Secondary | ICD-10-CM | POA: Diagnosis not present

## 2022-12-25 DIAGNOSIS — C50412 Malignant neoplasm of upper-outer quadrant of left female breast: Secondary | ICD-10-CM | POA: Diagnosis not present

## 2022-12-25 LAB — RAD ONC ARIA SESSION SUMMARY
Course Elapsed Days: 13
Plan Fractions Treated to Date: 10
Plan Prescribed Dose Per Fraction: 2.66 Gy
Plan Total Fractions Prescribed: 16
Plan Total Prescribed Dose: 42.56 Gy
Reference Point Dosage Given to Date: 26.6 Gy
Reference Point Session Dosage Given: 2.66 Gy
Session Number: 10

## 2022-12-26 ENCOUNTER — Other Ambulatory Visit: Payer: Self-pay

## 2022-12-26 ENCOUNTER — Ambulatory Visit
Admission: RE | Admit: 2022-12-26 | Discharge: 2022-12-26 | Disposition: A | Discharge status: Home / Self Care | Payer: PPO | Source: Ambulatory Visit | Attending: Radiation Oncology | Admitting: Radiation Oncology

## 2022-12-26 DIAGNOSIS — Z170 Estrogen receptor positive status [ER+]: Secondary | ICD-10-CM | POA: Diagnosis not present

## 2022-12-26 DIAGNOSIS — Z510 Encounter for antineoplastic radiation therapy: Secondary | ICD-10-CM | POA: Diagnosis not present

## 2022-12-26 DIAGNOSIS — C50412 Malignant neoplasm of upper-outer quadrant of left female breast: Secondary | ICD-10-CM | POA: Diagnosis not present

## 2022-12-26 LAB — RAD ONC ARIA SESSION SUMMARY
Course Elapsed Days: 14
Plan Fractions Treated to Date: 11
Plan Prescribed Dose Per Fraction: 2.66 Gy
Plan Total Fractions Prescribed: 16
Plan Total Prescribed Dose: 42.56 Gy
Reference Point Dosage Given to Date: 29.26 Gy
Reference Point Session Dosage Given: 2.66 Gy
Session Number: 11

## 2022-12-27 ENCOUNTER — Other Ambulatory Visit: Payer: Self-pay

## 2022-12-27 ENCOUNTER — Ambulatory Visit
Admission: RE | Admit: 2022-12-27 | Discharge: 2022-12-27 | Disposition: A | Discharge status: Home / Self Care | Payer: PPO | Source: Ambulatory Visit | Attending: Radiation Oncology | Admitting: Radiation Oncology

## 2022-12-27 DIAGNOSIS — Z170 Estrogen receptor positive status [ER+]: Secondary | ICD-10-CM | POA: Diagnosis not present

## 2022-12-27 DIAGNOSIS — Z510 Encounter for antineoplastic radiation therapy: Secondary | ICD-10-CM | POA: Diagnosis not present

## 2022-12-27 DIAGNOSIS — C50412 Malignant neoplasm of upper-outer quadrant of left female breast: Secondary | ICD-10-CM | POA: Diagnosis not present

## 2022-12-27 LAB — RAD ONC ARIA SESSION SUMMARY
Course Elapsed Days: 15
Plan Fractions Treated to Date: 12
Plan Prescribed Dose Per Fraction: 2.66 Gy
Plan Total Fractions Prescribed: 16
Plan Total Prescribed Dose: 42.56 Gy
Reference Point Dosage Given to Date: 31.92 Gy
Reference Point Session Dosage Given: 2.66 Gy
Session Number: 12

## 2022-12-28 ENCOUNTER — Ambulatory Visit: Payer: PPO

## 2022-12-29 ENCOUNTER — Ambulatory Visit (INDEPENDENT_AMBULATORY_CARE_PROVIDER_SITE_OTHER): Payer: PPO | Admitting: Physician Assistant

## 2022-12-29 ENCOUNTER — Other Ambulatory Visit: Payer: Self-pay

## 2022-12-29 ENCOUNTER — Encounter: Payer: Self-pay | Admitting: Physician Assistant

## 2022-12-29 ENCOUNTER — Ambulatory Visit: Payer: PPO

## 2022-12-29 ENCOUNTER — Ambulatory Visit: Payer: PPO | Admitting: Radiation Oncology

## 2022-12-29 ENCOUNTER — Ambulatory Visit
Admission: RE | Admit: 2022-12-29 | Discharge: 2022-12-29 | Disposition: A | Discharge status: Home / Self Care | Payer: PPO | Source: Ambulatory Visit | Attending: Radiation Oncology | Admitting: Radiation Oncology

## 2022-12-29 DIAGNOSIS — C50412 Malignant neoplasm of upper-outer quadrant of left female breast: Secondary | ICD-10-CM | POA: Diagnosis not present

## 2022-12-29 DIAGNOSIS — G4700 Insomnia, unspecified: Secondary | ICD-10-CM | POA: Diagnosis not present

## 2022-12-29 DIAGNOSIS — F319 Bipolar disorder, unspecified: Secondary | ICD-10-CM

## 2022-12-29 DIAGNOSIS — F411 Generalized anxiety disorder: Secondary | ICD-10-CM

## 2022-12-29 DIAGNOSIS — I482 Chronic atrial fibrillation, unspecified: Secondary | ICD-10-CM | POA: Diagnosis not present

## 2022-12-29 DIAGNOSIS — Z510 Encounter for antineoplastic radiation therapy: Secondary | ICD-10-CM | POA: Insufficient documentation

## 2022-12-29 DIAGNOSIS — Z170 Estrogen receptor positive status [ER+]: Secondary | ICD-10-CM

## 2022-12-29 DIAGNOSIS — Z87898 Personal history of other specified conditions: Secondary | ICD-10-CM | POA: Diagnosis not present

## 2022-12-29 LAB — RAD ONC ARIA SESSION SUMMARY
Course Elapsed Days: 17
Plan Fractions Treated to Date: 13
Plan Prescribed Dose Per Fraction: 2.66 Gy
Plan Total Fractions Prescribed: 16
Plan Total Prescribed Dose: 42.56 Gy
Reference Point Dosage Given to Date: 34.58 Gy
Reference Point Session Dosage Given: 2.66 Gy
Session Number: 13

## 2022-12-29 NOTE — Progress Notes (Unsigned)
[facility]  Patient ID: Margaret Mckenzie,  MRN: [id number]  PCP: Isaac Bliss, Rayford Halsted, MD  Date of Evaluation: 12/29/2022 Time spent:30 minutes  Chief Complaint:  Chief Complaint   Follow-up    HISTORY/CURRENT STATUS: HPI  For routine med check.  Breast cancer dx in Oct. Had lumpectomy last month. Will have about a month of XRT and that's all. Very happy about that.      Denies dizziness, syncope, seizures, numbness, tingling, tics, unsteady gait, slurred speech, confusion. Denies muscle or joint pain, stiffness, or dystonia.  Individual Medical History/ Review of Systems: Changes? :No      Past medications for mental health diagnoses include: Depakote, Lamictal, Prozac, Lexapro, Xanax, Sonata caused amnesia, Risperdal, Trazodone ineffective at low doses but caused dizziness at higher doses.  Allergies: Soap  Current Medications:  Current Outpatient Medications:    Ascorbic Acid (VITAMIN C) 1000 MG tablet, Take 1,000 mg by mouth at bedtime., Disp: , Rfl:    busPIRone (BUSPAR) 15 MG tablet, Take 1 tablet (15 mg total) by mouth daily., Disp: 90 tablet, Rfl: 0   Calcium Carbonate (CALCIUM 500 PO), Take 1 tablet by mouth at bedtime., Disp: , Rfl:    Calcium Carbonate (CALCIUM 600 PO), Take 1,200 mg by mouth at bedtime., Disp: , Rfl:    Cholecalciferol (VITAMIN D-3) 25 MCG (1000 UT) CAPS, Take 4,000 Units by mouth at bedtime., Disp: , Rfl:    cyanocobalamin (VITAMIN B12) 1000 MCG/ML injection, INJECT 1 ML (1,000 MCG) INTRAMUSCULARLY EVERY 30 DAYS, Disp: 9 mL, Rfl: 1   DILT-XR 180 MG 24 hr capsule, TAKE 1 CAPSULE BY MOUTH EVERY DAY, Disp: 90 capsule, Rfl: 3   diltiazem (CARDIZEM) 30 MG tablet, Take 1 tablet every 4 hours AS NEEDED for AFIB heart rate >100, Disp: 45 tablet, Rfl: 1   ELIQUIS 5 MG TABS tablet, TAKE 1 TABLET BY MOUTH TWICE A DAY, Disp: 180 tablet, Rfl: 3   ibuprofen (ADVIL) 800 MG tablet, Take 1 tablet (800 mg total) by mouth every 8 (eight)  hours as needed., Disp: 30 tablet, Rfl: 0   lamoTRIgine (LAMICTAL) 150 MG tablet, TAKE 2 TABLETS BY MOUTH AT BEDTIME, Disp: 180 tablet, Rfl: 0   lisinopril (ZESTRIL) 20 MG tablet, TAKE 1 TABLET BY MOUTH EVERY DAY, Disp: 90 tablet, Rfl: 0   Magnesium 400 MG CAPS, Take 400 mg by mouth at bedtime., Disp: , Rfl:    risperiDONE (RISPERDAL) 3 MG tablet, Take 1 tablet (3 mg total) by mouth at bedtime., Disp: 90 tablet, Rfl: 1   thiamine (VITAMIN B1) 100 MG tablet, Take 100 mg by mouth daily., Disp: , Rfl:    traZODone (DESYREL) 50 MG tablet, Take 1-2 tablets (50-100 mg total) by mouth at bedtime as needed. for sleep, Disp: 180 tablet, Rfl: 0   doxycycline (VIBRA-TABS) 100 MG tablet, Take 1 tablet (100 mg total) by mouth 2 (two) times daily. (Patient not taking: Reported on 12/29/2022), Disp: 20 tablet, Rfl: 0   oxyCODONE (OXY IR/ROXICODONE) 5 MG immediate release tablet, Take 1 tablet (5 mg total) by mouth every 6 (six) hours as needed for severe pain. (Patient not taking: Reported on 12/29/2022), Disp: 15 tablet, Rfl: 0 Medication Side Effects: none  Family Medical/ Social History: Changes?  No  MENTAL HEALTH EXAM:  There were no vitals taken for this visit.There is no height or weight on file to calculate BMI.  General Appearance: Casual, Neat, and Well Groomed  Eye Contact:  Good  Speech:  Clear and Coherent and Normal Rate  Volume:  Normal  Mood:  Euthymic  Affect:  Appropriate  Thought Process:  Goal Directed and Descriptions of Associations: Circumstantial  Orientation:  Full (Time, Place, and Person)  Thought Content: Logical   Suicidal Thoughts:  No  Homicidal Thoughts:  No  Memory:  WNL  Judgement:  Good  Insight:  Good  Psychomotor Activity:  Tremor and of oustretched arms, in hands bilat. Mild to moderate  Concentration:  Concentration: Good and Attention Span: Good  Recall:  Good  Fund of Knowledge: Good  Language: Good  Assets:  Desire for Improvement  ADL's:  Intact   Cognition: WNL  Prognosis:  Good   DIAGNOSES:  No diagnosis found.  Receiving Psychotherapy: Yes   Matt Schooler  RECOMMENDATIONS:  PDMP was reviewed.  Last Valium filled 09/15/2022.  Librium filled  12/04/2022     D/C valium. Start Campral 333 mg, 2 po tid.  Continue BuSpar 15 mg, 1 p.o. daily. (Needing a RF earlier than expected  Star Libium as per med list.   Continue Lamictal 150 mg, 2 p.o. nightly. Continue Risperdal 3 mg, 1 p.o. nightly. Continue trazodone 50 mg, 1/2-2 p.o. nightly as needed sleep. Continue therapy with Elias Else.  Return in 3 weeks.   Donnal Moat, PA-C

## 2022-12-30 DIAGNOSIS — Z510 Encounter for antineoplastic radiation therapy: Secondary | ICD-10-CM | POA: Diagnosis not present

## 2022-12-30 DIAGNOSIS — C50412 Malignant neoplasm of upper-outer quadrant of left female breast: Secondary | ICD-10-CM | POA: Diagnosis not present

## 2022-12-30 DIAGNOSIS — Z170 Estrogen receptor positive status [ER+]: Secondary | ICD-10-CM | POA: Diagnosis not present

## 2023-01-01 ENCOUNTER — Other Ambulatory Visit: Payer: Self-pay

## 2023-01-01 ENCOUNTER — Ambulatory Visit
Admission: RE | Admit: 2023-01-01 | Discharge: 2023-01-01 | Disposition: A | Discharge status: Home / Self Care | Payer: PPO | Source: Ambulatory Visit | Attending: Radiation Oncology | Admitting: Radiation Oncology

## 2023-01-01 DIAGNOSIS — Z510 Encounter for antineoplastic radiation therapy: Secondary | ICD-10-CM | POA: Diagnosis not present

## 2023-01-01 DIAGNOSIS — Z170 Estrogen receptor positive status [ER+]: Secondary | ICD-10-CM | POA: Diagnosis not present

## 2023-01-01 DIAGNOSIS — C50412 Malignant neoplasm of upper-outer quadrant of left female breast: Secondary | ICD-10-CM | POA: Diagnosis not present

## 2023-01-01 LAB — RAD ONC ARIA SESSION SUMMARY
Course Elapsed Days: 20
Plan Fractions Treated to Date: 14
Plan Prescribed Dose Per Fraction: 2.66 Gy
Plan Total Fractions Prescribed: 16
Plan Total Prescribed Dose: 42.56 Gy
Reference Point Dosage Given to Date: 37.24 Gy
Reference Point Session Dosage Given: 2.66 Gy
Session Number: 14

## 2023-01-02 ENCOUNTER — Other Ambulatory Visit: Payer: Self-pay

## 2023-01-02 ENCOUNTER — Ambulatory Visit
Admission: RE | Admit: 2023-01-02 | Discharge: 2023-01-02 | Disposition: A | Discharge status: Home / Self Care | Payer: PPO | Source: Ambulatory Visit | Attending: Radiation Oncology | Admitting: Radiation Oncology

## 2023-01-02 DIAGNOSIS — Z170 Estrogen receptor positive status [ER+]: Secondary | ICD-10-CM | POA: Diagnosis not present

## 2023-01-02 DIAGNOSIS — C50412 Malignant neoplasm of upper-outer quadrant of left female breast: Secondary | ICD-10-CM | POA: Diagnosis not present

## 2023-01-02 DIAGNOSIS — Z510 Encounter for antineoplastic radiation therapy: Secondary | ICD-10-CM | POA: Diagnosis not present

## 2023-01-02 LAB — RAD ONC ARIA SESSION SUMMARY
Course Elapsed Days: 21
Plan Fractions Treated to Date: 15
Plan Prescribed Dose Per Fraction: 2.66 Gy
Plan Total Fractions Prescribed: 16
Plan Total Prescribed Dose: 42.56 Gy
Reference Point Dosage Given to Date: 39.9 Gy
Reference Point Session Dosage Given: 2.66 Gy
Session Number: 15

## 2023-01-03 ENCOUNTER — Other Ambulatory Visit: Payer: Self-pay

## 2023-01-03 ENCOUNTER — Ambulatory Visit: Payer: PPO

## 2023-01-03 ENCOUNTER — Ambulatory Visit
Admission: RE | Admit: 2023-01-03 | Discharge: 2023-01-03 | Disposition: A | Discharge status: Home / Self Care | Payer: PPO | Source: Ambulatory Visit | Attending: Radiation Oncology | Admitting: Radiation Oncology

## 2023-01-03 DIAGNOSIS — C50412 Malignant neoplasm of upper-outer quadrant of left female breast: Secondary | ICD-10-CM | POA: Diagnosis not present

## 2023-01-03 DIAGNOSIS — Z510 Encounter for antineoplastic radiation therapy: Secondary | ICD-10-CM | POA: Diagnosis not present

## 2023-01-03 DIAGNOSIS — Z170 Estrogen receptor positive status [ER+]: Secondary | ICD-10-CM | POA: Diagnosis not present

## 2023-01-03 LAB — RAD ONC ARIA SESSION SUMMARY
Course Elapsed Days: 22
Plan Fractions Treated to Date: 16
Plan Prescribed Dose Per Fraction: 2.66 Gy
Plan Total Fractions Prescribed: 16
Plan Total Prescribed Dose: 42.56 Gy
Reference Point Dosage Given to Date: 42.56 Gy
Reference Point Session Dosage Given: 2.66 Gy
Session Number: 16

## 2023-01-04 ENCOUNTER — Inpatient Hospital Stay (HOSPITAL_BASED_OUTPATIENT_CLINIC_OR_DEPARTMENT_OTHER): Payer: PPO | Admitting: Hematology

## 2023-01-04 ENCOUNTER — Other Ambulatory Visit: Payer: Self-pay

## 2023-01-04 ENCOUNTER — Ambulatory Visit: Payer: PPO

## 2023-01-04 ENCOUNTER — Ambulatory Visit
Admission: RE | Admit: 2023-01-04 | Discharge: 2023-01-04 | Disposition: A | Discharge status: Home / Self Care | Payer: PPO | Source: Ambulatory Visit | Attending: Radiation Oncology | Admitting: Radiation Oncology

## 2023-01-04 VITALS — BP 133/87 | HR 84 | Temp 98.3°F | Resp 19 | Ht 63.5 in | Wt 165.3 lb

## 2023-01-04 DIAGNOSIS — Z87442 Personal history of urinary calculi: Secondary | ICD-10-CM | POA: Insufficient documentation

## 2023-01-04 DIAGNOSIS — F419 Anxiety disorder, unspecified: Secondary | ICD-10-CM | POA: Insufficient documentation

## 2023-01-04 DIAGNOSIS — Z170 Estrogen receptor positive status [ER+]: Secondary | ICD-10-CM | POA: Insufficient documentation

## 2023-01-04 DIAGNOSIS — Z510 Encounter for antineoplastic radiation therapy: Secondary | ICD-10-CM | POA: Diagnosis not present

## 2023-01-04 DIAGNOSIS — I10 Essential (primary) hypertension: Secondary | ICD-10-CM | POA: Insufficient documentation

## 2023-01-04 DIAGNOSIS — F319 Bipolar disorder, unspecified: Secondary | ICD-10-CM | POA: Insufficient documentation

## 2023-01-04 DIAGNOSIS — Z79899 Other long term (current) drug therapy: Secondary | ICD-10-CM | POA: Insufficient documentation

## 2023-01-04 DIAGNOSIS — I480 Paroxysmal atrial fibrillation: Secondary | ICD-10-CM | POA: Insufficient documentation

## 2023-01-04 DIAGNOSIS — Z7901 Long term (current) use of anticoagulants: Secondary | ICD-10-CM | POA: Insufficient documentation

## 2023-01-04 DIAGNOSIS — C50412 Malignant neoplasm of upper-outer quadrant of left female breast: Secondary | ICD-10-CM | POA: Insufficient documentation

## 2023-01-04 DIAGNOSIS — Z923 Personal history of irradiation: Secondary | ICD-10-CM | POA: Insufficient documentation

## 2023-01-04 LAB — RAD ONC ARIA SESSION SUMMARY
Course Elapsed Days: 23
Plan Fractions Treated to Date: 1
Plan Prescribed Dose Per Fraction: 2 Gy
Plan Total Fractions Prescribed: 4
Plan Total Prescribed Dose: 8 Gy
Reference Point Dosage Given to Date: 2 Gy
Reference Point Session Dosage Given: 2 Gy
Session Number: 17

## 2023-01-04 MED ORDER — ANASTROZOLE 1 MG PO TABS: 1.0000 mg | ORAL_TABLET | Freq: Every day | ORAL | 3 refills | Status: DC

## 2023-01-04 NOTE — Assessment & Plan Note (Signed)
-  pT1b,cN0M0, G1 -Found on screening mammogram, diagnosed in October 2023 -She underwent left breast lumpectomy, sentinel lymph nodes was not obtained due to the early stage disease and her age. -I discussed her surgical pathology findings, which is very favorable. -She completed adjuvant radiation --Given the strong ER and PR positivity, I do recommend adjuvant aromatase inhibitor to reduce her risk of cancer recurrence,  The potential benefit and side effects, which includes but not limited to, hot flash, skin and vaginal dryness, metabolic changes ( increased blood glucose, cholesterol, weight, etc.), slightly in increased risk of cardiovascular disease, cataracts, muscular and joint discomfort, osteopenia and osteoporosis, etc, were discussed with her in great details. She is interested, and we'll start after she completes radiation.

## 2023-01-04 NOTE — Progress Notes (Signed)
[place]   Telephone:([phone]) [phone] Fax:([phone]) [phone]   Clinic Follow up Note   Patient Care Team: Isaac Bliss, Rayford Halsted, MD as PCP - General (Internal Medicine) Minus Breeding, MD as PCP - Cardiology (Cardiology) Group, [facility] ([place]) Viona Gilmore, [facility] (Inactive) as Pharmacist (Pharmacist) Mauro Kaufmann, RN as Oncology Nurse Navigator Rockwell Germany, RN as Oncology Nurse Navigator Truitt Merle, MD as Consulting Physician (Hematology) Erroll Luna, MD as Consulting Physician (General Surgery) Kyung Rudd, MD as Consulting Physician (Radiation Oncology)  Date of Service:  01/04/2023  CHIEF COMPLAINT: f/u of Left Breast Cancer, ER+   CURRENT THERAPY:  Anastrozole starting 01/04/2023  ASSESSMENT:  Margaret Mckenzie is a 72 y.o. female with   Malignant neoplasm of upper-outer quadrant of left breast in female, estrogen receptor positive ([place]) -pT1b,cN0M0, G1 -Found on screening mammogram, diagnosed in October 2023 -She underwent left breast lumpectomy, sentinel lymph nodes was not obtained due to the early stage disease and her age. -I discussed her surgical pathology findings, which is very favorable. -She completed adjuvant radiation --Given the strong ER and PR positivity, I do recommend adjuvant aromatase inhibitor to reduce her risk of cancer recurrence,  The potential benefit and side effects, which includes but not limited to, hot flash, skin and vaginal dryness, metabolic changes ( increased blood glucose, cholesterol, weight, etc.), slightly in increased risk of cardiovascular disease, cataracts, muscular and joint discomfort, osteopenia and osteoporosis, etc, were discussed with her in great details. She is interested, I will call in anastrozole.  PLAN: -Discuss Antiestrogen Therapy and its side effects -Bone Density Scan 04/2023 -Encourage the pt to be more active and Exercise.  -I prescribe  Anastrozole -Survivorship-3 months with NP -lab f/u in 6 months  SUMMARY OF ONCOLOGIC HISTORY: Oncology History  Malignant neoplasm of upper-outer quadrant of left breast in female, estrogen receptor positive ([place])  09/26/2022 Imaging   CLINICAL DATA:  72 year old female presenting as a recall from screening for possible left breast mass.   EXAM: DIGITAL DIAGNOSTIC UNILATERAL LEFT MAMMOGRAM WITH TOMOSYNTHESIS; ULTRASOUND LEFT BREAST LIMITED  IMPRESSION: Suspicious mass measuring 0.9 cm in the left breast at 2 o'clock.   09/29/2022 Cancer Staging   Staging form: Breast, AJCC 8th Edition - Clinical stage from 09/29/2022: Stage IA (cT1b, cN0, cM0, G2, ER+, PR+, HER2-) - Signed by Truitt Merle, MD on 10/10/2022 Stage prefix: Initial diagnosis Histologic grading system: 3 grade system   09/29/2022 Initial Biopsy   Diagnosis Breast, left, needle core biopsy, 2:00 7 cmfn, ribbon clip - INVASIVE DUCTAL CARCINOMA, SEE NOTE - TUBULE FORMATION: SCORE 3 - NUCLEAR PLEOMORPHISM: SCORE 2 - MITOTIC COUNT: SCORE 1 - TOTAL SCORE: 6 - OVERALL GRADE: 2 - LYMPHOVASCULAR INVASION: NOT IDENTIFIED - CANCER LENGTH: 0.6 CM - CALCIFICATIONS: NOT IDENTIFIED  PROGNOSTIC INDICATORS Results: The tumor cells are negative for Her2 (1+). Estrogen Receptor: 95%, POSITIVE, STRONG STAINING INTENSITY Progesterone Receptor: 100%, POSITIVE, STRONG STAINING INTENSITY Proliferation Marker Ki67: 10%   10/09/2022 Initial Diagnosis   Malignant neoplasm of upper-outer quadrant of left breast in female, estrogen receptor positive ([place])   11/02/2022 Cancer Staging   Staging form: Breast, AJCC 8th Edition - Pathologic stage from 11/02/2022: Stage Unknown (pT1b, pNX, cM0, G1, ER+, PR+, HER2-) - Signed by Truitt Merle, MD on 01/04/2023 Stage prefix: Initial diagnosis Histologic grading system: 3 grade system Residual tumor (R): R0 - None      INTERVAL HISTORY:  Margaret Mckenzie is here for a follow up of  Left  Breast Cancer, ER+ She was last seen by me on 10/11/2022 She presents to the clinic alone. Pt states that Radiation is going well. Pt reports surgery went well and she recovered .     All other systems were reviewed with the patient and are negative.  MEDICAL HISTORY:  Past Medical History:  Diagnosis Date   Anxiety    Bipolar 1 disorder ([place])    Chicken pox    Depression    Hypertension    high blood pressure readings    Kidney disease    kidney stones and saw urologist, Dr. Edward Qualia for lithotripsy remotely    PAF (paroxysmal atrial fibrillation) [facility])     SURGICAL HISTORY: Past Surgical History:  Procedure Laterality Date   BREAST BIOPSY  10/31/2022   MM LT RADIOACTIVE SEED LOC MAMMO GUIDE 10/31/2022 [facility] MAMMOGRAPHY   BREAST LUMPECTOMY WITH RADIOACTIVE SEED LOCALIZATION Left 11/02/2022   Procedure: LEFT BREAST LUMPECTOMY WITH RADIOACTIVE SEED LOCALIZATION;  Surgeon: Erroll Luna, MD;  Location: [place];  Service: General;  Laterality: Left;   BUNIONECTOMY  2007   [place]    I have reviewed the social history and family history with the patient and they are unchanged from previous note.  ALLERGIES:  is allergic to soap.  MEDICATIONS:  Current Outpatient Medications  Medication Sig Dispense Refill   Ascorbic Acid (VITAMIN C) 1000 MG tablet Take 1,000 mg by mouth at bedtime.     busPIRone (BUSPAR) 15 MG tablet Take 1 tablet (15 mg total) by mouth daily. 90 tablet 0   Calcium Carbonate (CALCIUM 500 PO) Take 1 tablet by mouth at bedtime.     Calcium Carbonate (CALCIUM 600 PO) Take 1,200 mg by mouth at bedtime.     Cholecalciferol (VITAMIN D-3) 25 MCG (1000 UT) CAPS Take 4,000 Units by mouth at bedtime.     cyanocobalamin (VITAMIN B12) 1000 MCG/ML injection INJECT 1 ML (1,000 MCG) INTRAMUSCULARLY EVERY 30 DAYS 9 mL 1   DILT-XR 180 MG 24 hr capsule TAKE 1 CAPSULE BY MOUTH EVERY DAY 90 capsule 3   diltiazem (CARDIZEM) 30 MG  tablet Take 1 tablet every 4 hours AS NEEDED for AFIB heart rate >100 45 tablet 1   doxycycline (VIBRA-TABS) 100 MG tablet Take 1 tablet (100 mg total) by mouth 2 (two) times daily. (Patient not taking: Reported on 12/29/2022) 20 tablet 0   ELIQUIS 5 MG TABS tablet TAKE 1 TABLET BY MOUTH TWICE A DAY 180 tablet 3   ibuprofen (ADVIL) 800 MG tablet Take 1 tablet (800 mg total) by mouth every 8 (eight) hours as needed. 30 tablet 0   lamoTRIgine (LAMICTAL) 150 MG tablet TAKE 2 TABLETS BY MOUTH AT BEDTIME 180 tablet 0   lisinopril (ZESTRIL) 20 MG tablet TAKE 1 TABLET BY MOUTH EVERY DAY 90 tablet 0   Magnesium 400 MG CAPS Take 400 mg by mouth at bedtime.     oxyCODONE (OXY IR/ROXICODONE) 5 MG immediate release tablet Take 1 tablet (5 mg total) by mouth every 6 (six) hours as needed for severe pain. (Patient not taking: Reported on 12/29/2022) 15 tablet 0   risperiDONE (RISPERDAL) 3 MG tablet Take 1 tablet (3 mg total) by mouth at bedtime. 90 tablet 1   thiamine (VITAMIN B1) 100 MG tablet Take 100 mg by mouth daily.     traZODone (DESYREL) 50 MG tablet Take 1-2 tablets (50-100 mg total) by mouth at bedtime as needed. for sleep 180 tablet  0   No current facility-administered medications for this visit.    PHYSICAL EXAMINATION: ECOG PERFORMANCE STATUS: 0 - Asymptomatic  There were no vitals filed for this visit. Wt Readings from Last 3 Encounters:  12/13/22 170 lb (77.1 kg)  11/30/22 175 lb 8 oz (79.6 kg)  11/06/22 176 lb 8 oz (80.1 kg)     GENERAL:alert, no distress and comfortable SKIN: skin color normal, no rashes or significant lesions EYES: normal, Conjunctiva are pink and non-injected, sclera clear  NEURO: alert & oriented x 3 with fluent speech    LABORATORY DATA:  I have reviewed the data as listed    Latest Ref Rng & Units 11/06/2022    9:13 AM 10/11/2022   12:05 PM 07/16/2021    1:37 AM  CBC  WBC 4.0 - 10.5 K/uL 5.2  7.5  6.1   Hemoglobin 12.0 - 15.0 g/dL 13.8  12.2  13.0    Hematocrit 36.0 - 46.0 % 40.8  36.1  37.4   Platelets 150.0 - 400.0 K/uL 251.0  221  235         Latest Ref Rng & Units 12/13/2022    1:24 PM 11/06/2022    9:13 AM 10/31/2022   12:31 PM  CMP  Glucose 70 - 99 mg/dL 96  81  105   BUN 6 - 23 mg/dL 5  5  <5   Creatinine 0.40 - 1.20 mg/dL 0.67  0.65  0.64   Sodium 135 - 145 mEq/L 136  135  138   Potassium 3.5 - 5.1 mEq/L 3.7  4.2  4.6   Chloride 96 - 112 mEq/L 98  95  100   CO2 19 - 32 mEq/L 31  30  27   $ Calcium 8.4 - 10.5 mg/dL 9.6  9.5  9.6   Total Protein 6.0 - 8.3 g/dL  7.1    Total Bilirubin 0.2 - 1.2 mg/dL  0.5    Alkaline Phos 39 - 117 U/L  73    AST 0 - 37 U/L  59    ALT 0 - 35 U/L  60        RADIOGRAPHIC STUDIES: I have personally reviewed the radiological images as listed and agreed with the findings in the report. No results found.    No orders of the defined types were placed in this encounter.  All questions were answered. The patient knows to call the clinic with any problems, questions or concerns. No barriers to learning was detected. The total time spent in the appointment was 30 minutes.     Truitt Merle, MD 01/04/2023   Felicity Coyer, CMA, am acting as scribe for Truitt Merle, MD.   I have reviewed the above documentation for accuracy and completeness, and I agree with the above.

## 2023-01-05 ENCOUNTER — Other Ambulatory Visit: Payer: Self-pay

## 2023-01-05 ENCOUNTER — Ambulatory Visit
Admission: RE | Admit: 2023-01-05 | Discharge: 2023-01-05 | Disposition: A | Discharge status: Home / Self Care | Payer: PPO | Source: Ambulatory Visit | Attending: Radiation Oncology | Admitting: Radiation Oncology

## 2023-01-05 DIAGNOSIS — Z510 Encounter for antineoplastic radiation therapy: Secondary | ICD-10-CM | POA: Diagnosis not present

## 2023-01-05 LAB — RAD ONC ARIA SESSION SUMMARY
Course Elapsed Days: 24
Plan Fractions Treated to Date: 2
Plan Prescribed Dose Per Fraction: 2 Gy
Plan Total Fractions Prescribed: 4
Plan Total Prescribed Dose: 8 Gy
Reference Point Dosage Given to Date: 4 Gy
Reference Point Session Dosage Given: 2 Gy
Session Number: 18

## 2023-01-06 ENCOUNTER — Encounter: Payer: Self-pay | Admitting: Hematology

## 2023-01-08 ENCOUNTER — Ambulatory Visit: Payer: PPO

## 2023-01-08 ENCOUNTER — Ambulatory Visit
Admission: RE | Admit: 2023-01-08 | Discharge: 2023-01-08 | Disposition: A | Discharge status: Home / Self Care | Payer: PPO | Source: Ambulatory Visit | Attending: Radiation Oncology | Admitting: Radiation Oncology

## 2023-01-08 ENCOUNTER — Other Ambulatory Visit: Payer: Self-pay

## 2023-01-08 DIAGNOSIS — Z510 Encounter for antineoplastic radiation therapy: Secondary | ICD-10-CM | POA: Diagnosis not present

## 2023-01-08 LAB — RAD ONC ARIA SESSION SUMMARY
Course Elapsed Days: 27
Plan Fractions Treated to Date: 3
Plan Prescribed Dose Per Fraction: 2 Gy
Plan Total Fractions Prescribed: 4
Plan Total Prescribed Dose: 8 Gy
Reference Point Dosage Given to Date: 6 Gy
Reference Point Session Dosage Given: 2 Gy
Session Number: 19

## 2023-01-08 NOTE — Progress Notes (Signed)
Care Management & Coordination Services Pharmacy Note  01/08/2023 Name:  [name] MRN:  [id number] DOB:  Sep 13, 1951  Summary: -No new health concerns today -Denies any symptoms of a-fib or low/high BP -Denies any signs of bleed associated with Eliquis  Recommendations/Changes made from today's visit: -Continue medication therapy -Continue regular monitoring of HR/BP -Notify office if any cost concerns arise with Eliquis  Follow up plan: BP call in 3 months Pharmacist visit in 6 months   Subjective: Margaret Mckenzie is an 72 y.o. year old female who is a primary patient of Isaac Bliss, Rayford Halsted, MD.  The care coordination team was consulted for assistance with disease management and care coordination needs.    Engaged with patient by telephone for follow up visit.  Recent office visits: 11/06/22 Margaret Mckenzie - For general f/u. No med changes 10/09/22 Margaret Mckenzie - For low back pian, no med changes, referral to PT  Recent consult visits: Daily radiation treatment ongoing 11/22/22 Margaret Mckenzie, PT (Rehab) - For PT for low back pain 11/13/22 Margaret Mckenzie, PT (Rehab) - For PT for low back pain 11/01/22 Margaret Mckenzie, PT (Rehab) - For PT for low back pain 10/30/22 Margaret Copa, PA (Cardio) - For surgical clearance for lumpectomy of breast 10/13/22 Margaret Mckenzie, PT (Rehab) - For PT for low back pain 10/11/22 Margaret Merle, MD (Oncology) - For stage 1 left breast cancer, no med changes 08/09/22 Margaret Moat, PA (Behavioral Health) - no details available  Hospital visits: 11/02/22 Margaret Luna, MD (Scheduled Surgery) - left breast lumpectomy, no med changes   Objective:  Lab Results  Component Value Date   CREATININE 0.67 12/13/2022   BUN 5 (L) 12/13/2022   GFR 88.43 12/13/2022   GFRNONAA >60 10/31/2022   GFRAA 99 09/08/2019   NA 136 12/13/2022   K 3.7 12/13/2022   CALCIUM 9.6 12/13/2022   CO2 31 12/13/2022   GLUCOSE 96  12/13/2022    Lab Results  Component Value Date/Time   HGBA1C 5.2 11/06/2022 09:13 AM   HGBA1C 5.1 07/15/2021 09:08 AM   HGBA1C 5.0 06/04/2017 12:00 AM   GFR 88.43 12/13/2022 01:24 PM   GFR 89.15 11/06/2022 09:13 AM    Last diabetic Eye exam: No results found for: "HMDIABEYEEXA"  Last diabetic Foot exam: No results found for: "HMDIABFOOTEX"   Lab Results  Component Value Date   CHOL 244 (H) 11/06/2022   HDL 138.40 11/06/2022   LDLCALC 78 11/06/2022   LDLDIRECT 58.8 08/04/2013   TRIG 136.0 11/06/2022   CHOLHDL 2 11/06/2022       Latest Ref Rng & Units 11/06/2022    9:13 AM 10/11/2022   12:05 PM 07/15/2021    9:08 AM  Hepatic Function  Total Protein 6.0 - 8.3 g/dL 7.1  7.1  7.4   Albumin 3.5 - 5.2 g/dL 4.4  4.4  4.5   AST 0 - 37 U/L 59  46  35   ALT 0 - 35 U/L 60  45  27   Alk Phosphatase 39 - 117 U/L 73  90  80   Total Bilirubin 0.2 - 1.2 mg/dL 0.5  0.7  1.5     Lab Results  Component Value Date/Time   TSH 1.68 11/06/2022 09:13 AM   TSH 1.998 07/15/2021 03:22 PM   TSH 2.46 07/15/2021 09:08 AM       Latest Ref Rng & Units 11/06/2022    9:13 AM 10/11/2022   12:05 PM 07/16/2021  1:37 AM  CBC  WBC 4.0 - 10.5 K/uL 5.2  7.5  6.1   Hemoglobin 12.0 - 15.0 g/dL 13.8  12.2  13.0   Hematocrit 36.0 - 46.0 % 40.8  36.1  37.4   Platelets 150.0 - 400.0 K/uL 251.0  221  235     Lab Results  Component Value Date/Time   VD25OH 55.77 07/15/2021 09:08 AM   VD25OH 54.57 04/23/2020 08:24 AM   VITAMINB12 634 11/06/2022 09:13 AM   [id number] (H) 04/26/2022 01:44 PM    Clinical ASCVD: No  The ASCVD Risk score (Arnett DK, et al., 2019) failed to calculate for the following reasons:   The valid HDL cholesterol range is 20 to 100 mg/dL        12/13/2022    1:10 PM 11/06/2022    8:54 AM 10/09/2022    3:02 PM  Depression screen PHQ 2/9  Decreased Interest 0 0 0  Down, Depressed, Hopeless 0 0 0  PHQ - 2 Score 0 0 0  Altered sleeping 0 1 0  Tired, decreased energy 0  0 0  Change in appetite 0 0 0  Feeling bad or failure about yourself  0 0 0  Trouble concentrating 0 0 0  Moving slowly or fidgety/restless 0 0 0  Suicidal thoughts 0 0 0  PHQ-9 Score 0 1 0  Difficult doing work/chores Not difficult at all Not difficult at all Not difficult at all     Social History   Tobacco Use  Smoking Status Former   Packs/day: 0.50   Years: 40.00   Total pack years: 20.00   Types: Cigarettes   Quit date: 07/11/2017   Years since quitting: 5.4  Smokeless Tobacco Never  Tobacco Comments   pt has quit smoking   BP Readings from Last 3 Encounters:  01/04/23 133/87  12/13/22 135/89  11/30/22 (!) 151/92   Pulse Readings from Last 3 Encounters:  01/04/23 84  12/13/22 88  11/30/22 75   Wt Readings from Last 3 Encounters:  01/04/23 165 lb 4.8 oz (75 kg)  12/13/22 170 lb (77.1 kg)  11/30/22 175 lb 8 oz (79.6 kg)   BMI Readings from Last 3 Encounters:  01/04/23 28.82 kg/m  12/13/22 29.64 kg/m  11/30/22 30.12 kg/m    Allergies  Allergen Reactions   Soap Rash    Patients states soap at work drys out hands and has to use special cream to sooth skin.    Medications Reviewed Today     Reviewed by Margaret Merle, MD (Physician) on 01/06/23 at 1456  Med List Status: <None>   Medication Order Taking? Sig Documenting Provider Last Dose Status Informant  anastrozole (ARIMIDEX) 1 MG tablet [id number] Yes Take 1 tablet (1 mg total) by mouth daily. Margaret Merle, MD  Active   Ascorbic Acid (VITAMIN C) 1000 MG tablet [id number] No Take 1,000 mg by mouth at bedtime. [provider] Taking Active Self  busPIRone (BUSPAR) 15 MG tablet [id number] No Take 1 tablet (15 mg total) by mouth daily. Addison Lank, PA-C Taking Active   Calcium Carbonate (CALCIUM 500 PO) [id number] No Take 1 tablet by mouth at bedtime. [provider] Taking Active   Calcium Carbonate (CALCIUM 600 PO) [id number] No Take 1,200 mg by mouth at bedtime. [provider] Taking  Active Self  Cholecalciferol (VITAMIN D-3) 25 MCG (1000 UT) CAPS [id number] No Take 4,000 Units by mouth at bedtime. [provider] Taking Active Self  cyanocobalamin (VITAMIN B12)  1000 MCG/ML injection [id number] No INJECT 1 ML (1,000 MCG) INTRAMUSCULARLY EVERY 30 DAYS Isaac Bliss, Rayford Halsted, MD Taking Active   DILT-XR 180 MG 24 hr capsule [id number] No TAKE 1 CAPSULE BY MOUTH EVERY DAY Minus Breeding, MD Taking Active   diltiazem (CARDIZEM) 30 MG tablet [id number] No Take 1 tablet every 4 hours AS NEEDED for AFIB heart rate >100 Minus Breeding, MD Taking Active   doxycycline (VIBRA-TABS) 100 MG tablet [id number] No Take 1 tablet (100 mg total) by mouth 2 (two) times daily.  Patient not taking: Reported on 12/29/2022   Hayden Pedro, PA-C Not Taking Active   ELIQUIS 5 MG TABS tablet [id number] No TAKE 1 TABLET BY MOUTH TWICE A Kelton Pillar, MD Taking Active   ibuprofen (ADVIL) 800 MG tablet [id number] No Take 1 tablet (800 mg total) by mouth every 8 (eight) hours as needed. Margaret Luna, MD Taking Active   lamoTRIgine (LAMICTAL) 150 MG tablet [id number] No TAKE 2 TABLETS BY MOUTH AT BEDTIME Addison Lank, PA-C Taking Active   lisinopril (ZESTRIL) 20 MG tablet [id number] No TAKE 1 TABLET BY MOUTH EVERY DAY Isaac Bliss, Rayford Halsted, MD Taking Active   Magnesium 400 MG CAPS [id number] No Take 400 mg by mouth at bedtime. [provider] Taking Active Self  oxyCODONE (OXY IR/ROXICODONE) 5 MG immediate release tablet [id number] No Take 1 tablet (5 mg total) by mouth every 6 (six) hours as needed for severe pain.  Patient not taking: Reported on 12/29/2022   Margaret Luna, MD Not Taking Active   risperiDONE (RISPERDAL) 3 MG tablet [id number] No Take 1 tablet (3 mg total) by mouth at bedtime. Addison Lank, PA-C Taking Active   thiamine (VITAMIN B1) 100 MG tablet [id number] No Take 100 mg by mouth daily. [provider] Taking Active   traZODone (DESYREL) 50 MG  tablet [id number] No Take 1-2 tablets (50-100 mg total) by mouth at bedtime as needed. for sleep Addison Lank, PA-C Taking Active             SDOH:  (Social Determinants of Health) assessments and interventions performed: Yes SDOH Interventions    Flowsheet Row Office Visit from 12/13/2022 in [place] at [place] from 11/06/2022 in [place] at [place] Management from 07/12/2022 in [place] at [place] Management from 11/11/2021 in [place] at [place]  SDOH Interventions      Transportation Interventions -- -- -- Intervention Not Indicated  Depression Interventions/Treatment  PHQ2-9 Score <4 Follow-up Not Indicated PHQ2-9 Score <4 Follow-up Not Indicated -- --  Financial Strain Interventions -- -- Other (Comment)  [working on patient assistance] Other (Comment)  [will apply for Eliquis PAP in 2023]       Medication Assistance: None required.  Patient affirms current coverage meets needs.  Medication Access: Within the past 30 days, how often has patient missed a dose of medication? None Is a pillbox or other method used to improve adherence? Yes  Factors that may affect medication adherence? financial need Are meds synced by current pharmacy? No  Are meds delivered by current pharmacy? No  Does patient experience delays in picking up medications due to transportation concerns? No   [facility] Services Reviewed: Is patient disadvantaged to use [facility]?: Yes  Current Rx insurance plan: HTA Name and location of Current pharmacy:  [id number] [place]  [place] [address] Phone: [phone] Fax: [phone]  [facility] services reviewed with patient today?: No  Patient requests to transfer care to [facility]?: No  Reason patient declined to change pharmacies:  Disadvantaged due to insurance/mail order  Compliance/Adherence/Medication fill history: Care Gaps: COVID Vaccine Lung Cancer screening  Star-Rating Drugs: Lisinopril 26m PDC 100%   Assessment/Plan   Hypertension (BP goal <130/80) -Controlled -Current treatment: Lisinopril 255mqd -Medications previously tried: None  -Current home readings: Not checking, getting checked regularly at cancer center -Current dietary habits: not discussed -Current exercise habits: not discussed -Denies hypotensive/hypertensive symptoms -Educated on BP goals and benefits of medications for prevention of heart attack, stroke and kidney damage; Daily salt intake goal < 2300 mg; Proper BP monitoring technique; -Counseled to monitor BP at home if symptomatic, document, and provide log at future appointments -Recommended to continue current medication  Atrial Fibrillation (Goal: prevent stroke and major bleeding) -Controlled -CHA2DSVASC: 3 -Current treatment: Rhythym control:  Diltiazem XR 18064md Diltiazem 46m11m4 h prn Anticoagulation: Eliquis 5mg 61m -Medications previously tried: None -Home BP and HR readings: Not checking at home b/c getting checked regularly at cancer center  -Counseled on increased risk of stroke due to Afib and benefits of anticoagulation for stroke prevention; importance of adherence to anticoagulant exactly as prescribed; bleeding risk associated with Eliquis and importance of self-monitoring for signs/symptoms of bleeding; avoidance of NSAIDs due to increased bleeding risk with anticoagulants; seeking medical attention after a head injury or if there is blood in the urine/stool; -Recommended to continue current medication  [facility] [phone]

## 2023-01-09 ENCOUNTER — Other Ambulatory Visit: Payer: Self-pay

## 2023-01-09 ENCOUNTER — Encounter: Payer: Self-pay | Admitting: *Deleted

## 2023-01-09 ENCOUNTER — Encounter: Payer: Self-pay | Admitting: Radiation Oncology

## 2023-01-09 ENCOUNTER — Ambulatory Visit
Admission: RE | Admit: 2023-01-09 | Discharge: 2023-01-09 | Disposition: A | Discharge status: Home / Self Care | Payer: PPO | Source: Ambulatory Visit | Attending: Radiation Oncology | Admitting: Radiation Oncology

## 2023-01-09 DIAGNOSIS — Z170 Estrogen receptor positive status [ER+]: Secondary | ICD-10-CM | POA: Diagnosis not present

## 2023-01-09 DIAGNOSIS — C50412 Malignant neoplasm of upper-outer quadrant of left female breast: Secondary | ICD-10-CM | POA: Diagnosis not present

## 2023-01-09 DIAGNOSIS — Z510 Encounter for antineoplastic radiation therapy: Secondary | ICD-10-CM | POA: Diagnosis not present

## 2023-01-09 LAB — RAD ONC ARIA SESSION SUMMARY
Course Elapsed Days: 28
Plan Fractions Treated to Date: 4
Plan Prescribed Dose Per Fraction: 2 Gy
Plan Total Fractions Prescribed: 4
Plan Total Prescribed Dose: 8 Gy
Reference Point Dosage Given to Date: 8 Gy
Reference Point Session Dosage Given: 2 Gy
Session Number: 20

## 2023-01-10 NOTE — Progress Notes (Signed)
                                                                                                                                                             Patient Name: Margaret Mckenzie MRN: [id number] DOB: [birth year] Referring Physician: [name] (Profile Not Attached) Date of Service: 01/09/2023 [facility], [place]                                                        End Of Treatment Note  Diagnoses: C50.412-Malignant neoplasm of upper-outer quadrant of left female breast  Cancer Staging:  Stage IA, pT1b,cN0M0 grade 1 ER/PR positive invasive ductal carcinoma of the left breast.   Intent: Curative  Radiation Treatment Dates: 12/12/2022 through 01/09/2023 Site Technique Total Dose (Gy) Dose per Fx (Gy) Completed Fx Beam Energies  Breast, Left: Breast_L 3D 42.56/42.56 2.66 16/16 10XFFF  Breast, Left: Breast_L_Bst 3D 8/8 2 4/4 6X, 10X   Narrative: The patient tolerated radiation therapy relatively well. She developed anticipated skin changes in the treatment field but no complaints of fatigue.  Plan: The patient will receive a call in about one month from the radiation oncology department. She will continue follow up with Dr. Burr Medico as well.   ________________________________________________    Carola Rhine, [facility]

## 2023-01-11 ENCOUNTER — Telehealth: Payer: Self-pay

## 2023-01-11 NOTE — Progress Notes (Signed)
Care Management & Coordination Services Pharmacy Team  Reason for Encounter: Appointment Reminder  Attempted contact patient to confirm telephone appointment with Burman Riis, PharmD on 01/12/2023 at 8:30. Left appointment reminder message.    Do you have any problems getting your medications?  If yes what types of problems are you experiencing?   What is your top health concern you would like to discuss at your upcoming visit?   Have you seen any other providers since your last visit with PCP?    Star Rating Drugs: Lisinopril 20 mg - [facility]  Care Gaps: AWV - completed 12/13/2022 Lung Ca Screen - never done Covid - overdue   [place] Pharmacist Assistant [phone]

## 2023-01-12 ENCOUNTER — Ambulatory Visit: Payer: PPO

## 2023-01-18 ENCOUNTER — Other Ambulatory Visit: Payer: Self-pay | Admitting: Internal Medicine

## 2023-01-18 DIAGNOSIS — I10 Essential (primary) hypertension: Secondary | ICD-10-CM

## 2023-01-22 ENCOUNTER — Other Ambulatory Visit: Payer: PPO

## 2023-01-25 ENCOUNTER — Other Ambulatory Visit: Payer: Self-pay | Admitting: Physician Assistant

## 2023-02-09 ENCOUNTER — Ambulatory Visit: Payer: PPO | Admitting: Physician Assistant

## 2023-02-09 ENCOUNTER — Encounter: Payer: Self-pay | Admitting: Physician Assistant

## 2023-02-09 DIAGNOSIS — F411 Generalized anxiety disorder: Secondary | ICD-10-CM | POA: Diagnosis not present

## 2023-02-09 DIAGNOSIS — F319 Bipolar disorder, unspecified: Secondary | ICD-10-CM | POA: Diagnosis not present

## 2023-02-09 DIAGNOSIS — Z87898 Personal history of other specified conditions: Secondary | ICD-10-CM

## 2023-02-09 DIAGNOSIS — G4700 Insomnia, unspecified: Secondary | ICD-10-CM | POA: Diagnosis not present

## 2023-02-09 MED ORDER — RISPERIDONE 3 MG PO TABS: 3.0000 mg | ORAL_TABLET | Freq: Every day | ORAL | 1 refills | Status: DC

## 2023-02-09 MED ORDER — TRAZODONE HCL 50 MG PO TABS: 50.0000 mg | ORAL_TABLET | Freq: Every evening | ORAL | 1 refills | Status: DC | PRN

## 2023-02-09 MED ORDER — LAMOTRIGINE 150 MG PO TABS: 300.0000 mg | ORAL_TABLET | Freq: Every day | ORAL | 1 refills | Status: DC

## 2023-02-09 MED ORDER — DIAZEPAM 10 MG PO TABS: 5.0000 mg | ORAL_TABLET | Freq: Two times a day (BID) | ORAL | 2 refills | Status: DC | PRN

## 2023-02-09 MED ORDER — BUSPIRONE HCL 15 MG PO TABS: 15.0000 mg | ORAL_TABLET | Freq: Every day | ORAL | 0 refills | Status: DC

## 2023-02-09 NOTE — Progress Notes (Unsigned)
[facility]  Patient ID: Margaret Mckenzie,  MRN: [id number]  PCP: Isaac Bliss, Rayford Halsted, MD  Date of Evaluation: 02/09/2023 Time spent:20 minutes  Chief Complaint:  Chief Complaint   Anxiety; Depression; Insomnia; Follow-up     HISTORY/CURRENT STATUS: HPI  For routine med check.  Is doing very well now. No alcohol since Jan 2024. Doesn't miss it at all. Patient is able to enjoy things.  Energy and motivation are good.  Work is going well.   No extreme sadness, tearfulness, or feelings of hopelessness.  Sleeps well most of the time. ADLs and personal hygiene are normal.   Denies any changes in concentration, making decisions, or remembering things.  Appetite has not changed. She's gradually losing weight on purpose.  Gets anxious at times, no PA though. Valium is helpful, not needing it often. Denies suicidal or homicidal thoughts.  Patient denies increased energy with decreased need for sleep, increased talkativeness, racing thoughts, impulsivity or risky behaviors, increased spending, increased libido, grandiosity, increased irritability or anger, paranoia, or hallucinations.  Denies dizziness, syncope, seizures, numbness, tingling, tics, unsteady gait, slurred speech, confusion. Denies muscle or joint pain, stiffness, or dystonia.  Individual Medical History/ Review of Systems: Changes? :No     Past medications for mental health diagnoses include: Depakote, Lamictal, Prozac, Lexapro, Xanax, Sonata caused amnesia, Risperdal, Trazodone ineffective at low doses but caused dizziness at higher doses.  Librium taper for detox, toward the end of the treatment she had issues with balance.  Acamprosate, questionably caused imbalance.  Allergies: Soap  Current Medications:  Current Outpatient Medications:    anastrozole (ARIMIDEX) 1 MG tablet, Take 1 tablet (1 mg total) by mouth daily., Disp: 30 tablet, Rfl: 3   Ascorbic Acid (VITAMIN C) 1000 MG tablet, Take 1,000 mg  by mouth at bedtime., Disp: , Rfl:    Calcium Carbonate (CALCIUM 500 PO), Take 1 tablet by mouth at bedtime., Disp: , Rfl:    Calcium Carbonate (CALCIUM 600 PO), Take 1,200 mg by mouth at bedtime., Disp: , Rfl:    Cholecalciferol (VITAMIN D-3) 25 MCG (1000 UT) CAPS, Take 4,000 Units by mouth at bedtime., Disp: , Rfl:    cyanocobalamin (VITAMIN B12) 1000 MCG/ML injection, INJECT 1 ML (1,000 MCG) INTRAMUSCULARLY EVERY 30 DAYS, Disp: 9 mL, Rfl: 1   DILT-XR 180 MG 24 hr capsule, TAKE 1 CAPSULE BY MOUTH EVERY DAY, Disp: 90 capsule, Rfl: 3   ELIQUIS 5 MG TABS tablet, TAKE 1 TABLET BY MOUTH TWICE A DAY, Disp: 180 tablet, Rfl: 3   ibuprofen (ADVIL) 800 MG tablet, Take 1 tablet (800 mg total) by mouth every 8 (eight) hours as needed., Disp: 30 tablet, Rfl: 0   lisinopril (ZESTRIL) 20 MG tablet, TAKE 1 TABLET BY MOUTH EVERY DAY, Disp: 90 tablet, Rfl: 1   Magnesium 400 MG CAPS, Take 400 mg by mouth at bedtime., Disp: , Rfl:    thiamine (VITAMIN B1) 100 MG tablet, Take 100 mg by mouth daily., Disp: , Rfl:    busPIRone (BUSPAR) 15 MG tablet, Take 1 tablet (15 mg total) by mouth daily., Disp: 90 tablet, Rfl: 0   diazepam (VALIUM) 10 MG tablet, Take 0.5-1 tablets (5-10 mg total) by mouth every 12 (twelve) hours as needed for anxiety., Disp: 30 tablet, Rfl: 2   diltiazem (CARDIZEM) 30 MG tablet, Take 1 tablet every 4 hours AS NEEDED for AFIB heart rate >100 (Patient not taking: Reported on 02/09/2023), Disp: 45 tablet, Rfl: 1   doxycycline (VIBRA-TABS) 100 MG  tablet, Take 1 tablet (100 mg total) by mouth 2 (two) times daily. (Patient not taking: Reported on 12/29/2022), Disp: 20 tablet, Rfl: 0   lamoTRIgine (LAMICTAL) 150 MG tablet, Take 2 tablets (300 mg total) by mouth at bedtime., Disp: 180 tablet, Rfl: 1   oxyCODONE (OXY IR/ROXICODONE) 5 MG immediate release tablet, Take 1 tablet (5 mg total) by mouth every 6 (six) hours as needed for severe pain. (Patient not taking: Reported on 12/29/2022), Disp: 15 tablet, Rfl:  0   risperiDONE (RISPERDAL) 3 MG tablet, Take 1 tablet (3 mg total) by mouth at bedtime., Disp: 90 tablet, Rfl: 1   traZODone (DESYREL) 50 MG tablet, Take 1-2 tablets (50-100 mg total) by mouth at bedtime as needed. for sleep, Disp: 180 tablet, Rfl: 1 Medication Side Effects: none  Family Medical/ Social History: Changes?  No  MENTAL HEALTH EXAM:  There were no vitals taken for this visit.There is no height or weight on file to calculate BMI.  General Appearance: Casual, Neat, and Well Groomed  Eye Contact:  Good  Speech:  Clear and Coherent and Normal Rate  Volume:  Normal  Mood:  Euthymic  Affect:  Appropriate  Thought Process:  Goal Directed and Descriptions of Associations: Circumstantial  Orientation:  Full (Time, Place, and Person)  Thought Content: Logical   Suicidal Thoughts:  No  Homicidal Thoughts:  No  Memory:  WNL  Judgement:  Good  Insight:  Good  Psychomotor Activity:  Normal  Concentration:  Concentration: Good and Attention Span: Good  Recall:  Good  Fund of Knowledge: Good  Language: Good  Assets:  Desire for Improvement Financial Resources/Insurance Housing Transportation Vocational/Educational  ADL's:  Intact  Cognition: WNL  Prognosis:  Good   DIAGNOSES:    ICD-10-CM   1. Bipolar I disorder ([place])  F31.9     2. Generalized anxiety disorder  F41.1     3. Insomnia, unspecified type  G47.00     4. History of alcohol use disorder  Z87.898       Receiving Psychotherapy: Yes   Matt Schooler  RECOMMENDATIONS:  PDMP was reviewed.  Last Valium filled 01/17/2023. I provided 20 minutes of face to face time during this encounter, including time spent before and after the visit in records review, medical decision making, counseling pertinent to today's visit, and charting.   I'm glad to see her doing so well. No change in meds needed.   Continue BuSpar 15 mg, 1 p.o. daily.  Continue Valium 10 mg , 1/2-1 bid prn.  Continue Lamictal 150 mg, 2 p.o.  nightly. Continue Risperdal 3 mg, 1 p.o. nightly. Continue trazodone 50 mg, 1/2-2 p.o. nightly as needed sleep. Continue therapy with Elias Else.  Return in 6 months.   Donnal Moat, PA-C

## 2023-02-10 ENCOUNTER — Other Ambulatory Visit: Payer: Self-pay | Admitting: Hematology

## 2023-02-19 ENCOUNTER — Ambulatory Visit
Admission: RE | Admit: 2023-02-19 | Discharge: 2023-02-19 | Disposition: A | Discharge status: Home / Self Care | Payer: PPO | Source: Ambulatory Visit | Attending: Radiation Oncology | Admitting: Radiation Oncology

## 2023-02-19 NOTE — Progress Notes (Signed)
  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   Intent: Curative  Radiation Treatment Dates: 12/12/2022 through 01/09/2023 Site Technique Total Dose (Gy) Dose per Fx (Gy) Completed Fx Beam Energies  Breast, Left: Breast_L 3D 42.56/42.56 2.66 16/16 10XFFF  Breast, Left: Breast_L_Bst 3D 8/8 2 4/4 6X, 10X  (as documented in provider EOT note)   The patient was not available for call today. Voicemail left.  The patient was encouraged to avoid sun exposure in the area of prior treatment for up to one year following radiation with either sunscreen or by the style of clothing worn in the sun.  The patient has scheduled follow up with her medical oncologist Dr. Burr Medico for ongoing surveillance, and was encouraged to call if she develops concerns or questions regarding radiation.   Leandra Kern, LPN

## 2023-03-05 ENCOUNTER — Telehealth: Payer: Self-pay | Admitting: Adult Health

## 2023-03-05 NOTE — Telephone Encounter (Signed)
Rescheduled appointment per room/resource. Left a voicemail.

## 2023-03-23 DIAGNOSIS — G4700 Insomnia, unspecified: Secondary | ICD-10-CM | POA: Diagnosis not present

## 2023-03-23 DIAGNOSIS — F319 Bipolar disorder, unspecified: Secondary | ICD-10-CM | POA: Diagnosis not present

## 2023-03-23 DIAGNOSIS — Z7901 Long term (current) use of anticoagulants: Secondary | ICD-10-CM | POA: Diagnosis not present

## 2023-03-23 DIAGNOSIS — I10 Essential (primary) hypertension: Secondary | ICD-10-CM | POA: Diagnosis not present

## 2023-03-23 DIAGNOSIS — E538 Deficiency of other specified B group vitamins: Secondary | ICD-10-CM | POA: Diagnosis not present

## 2023-03-23 DIAGNOSIS — D6869 Other thrombophilia: Secondary | ICD-10-CM | POA: Diagnosis not present

## 2023-03-23 DIAGNOSIS — H9192 Unspecified hearing loss, left ear: Secondary | ICD-10-CM | POA: Diagnosis not present

## 2023-03-23 DIAGNOSIS — H547 Unspecified visual loss: Secondary | ICD-10-CM | POA: Diagnosis not present

## 2023-03-23 DIAGNOSIS — C50912 Malignant neoplasm of unspecified site of left female breast: Secondary | ICD-10-CM | POA: Diagnosis not present

## 2023-03-23 DIAGNOSIS — F419 Anxiety disorder, unspecified: Secondary | ICD-10-CM | POA: Diagnosis not present

## 2023-03-23 DIAGNOSIS — I4891 Unspecified atrial fibrillation: Secondary | ICD-10-CM | POA: Diagnosis not present

## 2023-03-23 DIAGNOSIS — E663 Overweight: Secondary | ICD-10-CM | POA: Diagnosis not present

## 2023-04-06 ENCOUNTER — Encounter: Payer: Self-pay | Admitting: *Deleted

## 2023-04-06 ENCOUNTER — Inpatient Hospital Stay: Payer: PPO | Attending: Radiation Oncology | Admitting: Adult Health

## 2023-04-06 ENCOUNTER — Encounter: Payer: PPO | Admitting: Adult Health

## 2023-04-06 ENCOUNTER — Encounter: Payer: Self-pay | Admitting: Adult Health

## 2023-04-06 ENCOUNTER — Other Ambulatory Visit: Payer: Self-pay

## 2023-04-06 VITALS — BP 140/79 | HR 95 | Temp 98.2°F | Resp 16 | Wt 146.2 lb

## 2023-04-06 DIAGNOSIS — Z823 Family history of stroke: Secondary | ICD-10-CM | POA: Diagnosis not present

## 2023-04-06 DIAGNOSIS — I480 Paroxysmal atrial fibrillation: Secondary | ICD-10-CM | POA: Insufficient documentation

## 2023-04-06 DIAGNOSIS — M85852 Other specified disorders of bone density and structure, left thigh: Secondary | ICD-10-CM | POA: Insufficient documentation

## 2023-04-06 DIAGNOSIS — C50412 Malignant neoplasm of upper-outer quadrant of left female breast: Secondary | ICD-10-CM | POA: Insufficient documentation

## 2023-04-06 DIAGNOSIS — I10 Essential (primary) hypertension: Secondary | ICD-10-CM | POA: Diagnosis not present

## 2023-04-06 DIAGNOSIS — Z87891 Personal history of nicotine dependence: Secondary | ICD-10-CM | POA: Insufficient documentation

## 2023-04-06 DIAGNOSIS — Z8249 Family history of ischemic heart disease and other diseases of the circulatory system: Secondary | ICD-10-CM | POA: Diagnosis not present

## 2023-04-06 DIAGNOSIS — Z79811 Long term (current) use of aromatase inhibitors: Secondary | ICD-10-CM | POA: Insufficient documentation

## 2023-04-06 DIAGNOSIS — Z170 Estrogen receptor positive status [ER+]: Secondary | ICD-10-CM | POA: Insufficient documentation

## 2023-04-06 DIAGNOSIS — Z79899 Other long term (current) drug therapy: Secondary | ICD-10-CM | POA: Insufficient documentation

## 2023-04-06 DIAGNOSIS — Z8262 Family history of osteoporosis: Secondary | ICD-10-CM | POA: Insufficient documentation

## 2023-04-06 DIAGNOSIS — Z818 Family history of other mental and behavioral disorders: Secondary | ICD-10-CM | POA: Insufficient documentation

## 2023-04-06 DIAGNOSIS — Z87442 Personal history of urinary calculi: Secondary | ICD-10-CM | POA: Insufficient documentation

## 2023-04-06 DIAGNOSIS — Z510 Encounter for antineoplastic radiation therapy: Secondary | ICD-10-CM | POA: Diagnosis present

## 2023-04-06 NOTE — Progress Notes (Signed)
SURVIVORSHIP VISIT:   BRIEF ONCOLOGIC HISTORY:  Oncology History  Malignant neoplasm of upper-outer quadrant of left breast in female, estrogen receptor positive (HCC)  09/26/2022 Imaging   CLINICAL DATA:  72 year old female presenting as a recall from screening for possible left breast mass.   EXAM: DIGITAL DIAGNOSTIC UNILATERAL LEFT MAMMOGRAM WITH TOMOSYNTHESIS; ULTRASOUND LEFT BREAST LIMITED  IMPRESSION: Suspicious mass measuring 0.9 cm in the left breast at 2 o'clock.   09/29/2022 Cancer Staging   Staging form: Breast, AJCC 8th Edition - Clinical stage from 09/29/2022: Stage IA (cT1b, cN0, cM0, G2, ER+, PR+, HER2-) - Signed by Malachy Mood, MD on 10/10/2022 Stage prefix: Initial diagnosis Histologic grading system: 3 grade system   09/29/2022 Initial Biopsy   Diagnosis Breast, left, needle core biopsy, 2:00 7 cmfn, ribbon clip - INVASIVE DUCTAL CARCINOMA, SEE NOTE - TUBULE FORMATION: SCORE 3 - NUCLEAR PLEOMORPHISM: SCORE 2 - MITOTIC COUNT: SCORE 1 - TOTAL SCORE: 6 - OVERALL GRADE: 2 - LYMPHOVASCULAR INVASION: NOT IDENTIFIED - CANCER LENGTH: 0.6 CM - CALCIFICATIONS: NOT IDENTIFIED  PROGNOSTIC INDICATORS Results: The tumor cells are negative for Her2 (1+). Estrogen Receptor: 95%, POSITIVE, STRONG STAINING INTENSITY Progesterone Receptor: 100%, POSITIVE, STRONG STAINING INTENSITY Proliferation Marker Ki67: 10%   10/09/2022 Initial Diagnosis   Malignant neoplasm of upper-outer quadrant of left breast in female, estrogen receptor positive (HCC)   11/02/2022 Cancer Staging   Staging form: Breast, AJCC 8th Edition - Pathologic stage from 11/02/2022: Stage Unknown (pT1b, pNX, cM0, G1, ER+, PR+, HER2-) - Signed by Malachy Mood, MD on 01/04/2023 Stage prefix: Initial diagnosis Histologic grading system: 3 grade system Residual tumor (R): R0 - None   12/12/2022 - 01/09/2023 Radiation Therapy   Site Technique Total Dose (Gy) Dose per Fx (Gy) Completed Fx Beam Energies  Breast, Left:  Breast_L 3D 42.56/42.56 2.66 16/16 10XFFF  Breast, Left: Breast_L_Bst 3D 8/8 2 4/4 6X, 10X     12/2022 -  Anti-estrogen oral therapy   Anastrozole     INTERVAL HISTORY:  [name] to review her survivorship care plan detailing her treatment course for breast cancer, as well as monitoring long-term side effects of that treatment, education regarding health maintenance, screening, and overall wellness and health promotion.     Overall, [name] reports feeling quite well.  She is taking Anastrozole daily and is tolerating it very well.    REVIEW OF SYSTEMS:  Review of Systems  Constitutional:  Negative for appetite change, chills, fatigue, fever and unexpected weight change.  HENT:   Negative for hearing loss, lump/mass and trouble swallowing.   Eyes:  Negative for eye problems and icterus.  Respiratory:  Negative for chest tightness, cough and shortness of breath.   Cardiovascular:  Negative for chest pain, leg swelling and palpitations.  Gastrointestinal:  Negative for abdominal distention, abdominal pain, constipation, diarrhea, nausea and vomiting.  Endocrine: Negative for hot flashes.  Genitourinary:  Negative for difficulty urinating.   Musculoskeletal:  Negative for arthralgias.  Skin:  Negative for itching and rash.  Neurological:  Negative for dizziness, extremity weakness, headaches and numbness.  Hematological:  Negative for adenopathy. Does not bruise/bleed easily.  Psychiatric/Behavioral:  Negative for depression. The patient is not nervous/anxious.    Breast: Denies any new nodularity, masses, tenderness, nipple changes, or nipple discharge.      PAST MEDICAL/SURGICAL HISTORY:  Past Medical History:  Diagnosis Date   Anxiety    Bipolar 1 disorder (HCC)    Chicken pox    Depression    Hypertension  high blood pressure readings    Kidney disease    kidney stones and saw urologist, Dr. Dillard Cannon for lithotripsy remotely    PAF (paroxysmal atrial  fibrillation) [facility])    Past Surgical History:  Procedure Laterality Date   BREAST BIOPSY  10/31/2022   MM LT RADIOACTIVE SEED LOC MAMMO GUIDE 10/31/2022 [facility] MAMMOGRAPHY   BREAST LUMPECTOMY WITH RADIOACTIVE SEED LOCALIZATION Left 11/02/2022   Procedure: LEFT BREAST LUMPECTOMY WITH RADIOACTIVE SEED LOCALIZATION;  Surgeon: Harriette Bouillon, MD;  Location: [facility];  Service: General;  Laterality: Left;   BUNIONECTOMY  2007   TONSILLECTOMY AND ADENOIDECTOMY  1975     ALLERGIES:  Allergies  Allergen Reactions   Nefazodone Other (See Comments)    Hypersensitivity   Trazodone Other (See Comments)    Trazodone causes lethargy the morning after taking it   Soap Rash    Patients states soap at work drys out hands and has to use special cream to sooth skin.     CURRENT MEDICATIONS:  Outpatient Encounter Medications as of 04/06/2023  Medication Sig Note   anastrozole (ARIMIDEX) 1 MG tablet TAKE 1 TABLET BY MOUTH EVERY DAY    Ascorbic Acid (VITAMIN C) 1000 MG tablet Take 1,000 mg by mouth at bedtime.    busPIRone (BUSPAR) 15 MG tablet Take 1 tablet (15 mg total) by mouth daily.    Calcium Carbonate (CALCIUM 500 PO) Take 1 tablet by mouth at bedtime.    Cholecalciferol (VITAMIN D-3) 25 MCG (1000 UT) CAPS Take 4,000 Units by mouth at bedtime.    cyanocobalamin (VITAMIN B12) 1000 MCG/ML injection INJECT 1 ML (1,000 MCG) INTRAMUSCULARLY EVERY 30 DAYS    diazepam (VALIUM) 10 MG tablet Take 0.5-1 tablets (5-10 mg total) by mouth every 12 (twelve) hours as needed for anxiety.    DILT-XR 180 MG 24 hr capsule TAKE 1 CAPSULE BY MOUTH EVERY DAY    diltiazem (CARDIZEM) 30 MG tablet Take 1 tablet every 4 hours AS NEEDED for AFIB heart rate >100 (Patient not taking: Reported on 02/09/2023) 04/06/2023: Patient reports she has not needed it   ELIQUIS 5 MG TABS tablet TAKE 1 TABLET BY MOUTH TWICE A DAY    lamoTRIgine (LAMICTAL) 150 MG tablet Take 2 tablets (300 mg total) by mouth at bedtime.     lisinopril (ZESTRIL) 20 MG tablet TAKE 1 TABLET BY MOUTH EVERY DAY    Magnesium 400 MG CAPS Take 400 mg by mouth at bedtime.    risperiDONE (RISPERDAL) 3 MG tablet Take 1 tablet (3 mg total) by mouth at bedtime.    thiamine (VITAMIN B1) 100 MG tablet Take 100 mg by mouth daily.    traZODone (DESYREL) 50 MG tablet Take 1-2 tablets (50-100 mg total) by mouth at bedtime as needed. for sleep    [DISCONTINUED] Calcium Carbonate (CALCIUM 600 PO) Take 1,200 mg by mouth at bedtime.    [DISCONTINUED] doxycycline (VIBRA-TABS) 100 MG tablet Take 1 tablet (100 mg total) by mouth 2 (two) times daily. (Patient not taking: Reported on 12/29/2022)    [DISCONTINUED] ibuprofen (ADVIL) 800 MG tablet Take 1 tablet (800 mg total) by mouth every 8 (eight) hours as needed.    [DISCONTINUED] oxyCODONE (OXY IR/ROXICODONE) 5 MG immediate release tablet Take 1 tablet (5 mg total) by mouth every 6 (six) hours as needed for severe pain. (Patient not taking: Reported on 12/29/2022)    No facility-administered encounter medications on file as of 04/06/2023.     ONCOLOGIC FAMILY HISTORY:  Family  History  Problem Relation Age of Onset   Osteoporosis Mother    Stroke Mother        TIA, stoke   Mental retardation Mother    Hypertension Father    Heart disease Father 13   Stroke Father 64   Mental illness Sister    Breast cancer Neg Hx      SOCIAL HISTORY:  Social History   Socioeconomic History   Marital status: Divorced    Spouse name: Not on file   Number of children: 0   Years of education: Not on file   Highest education level: Associate degree: occupational, Scientist, product/process development, or vocational program  Occupational History   Not on file  Tobacco Use   Smoking status: Former    Packs/day: 0.50    Years: 40.00    Additional pack years: 0.00    Total pack years: 20.00    Types: Cigarettes    Quit date: 07/11/2017    Years since quitting: 5.7   Smokeless tobacco: Never   Tobacco comments:    pt has quit smoking   Vaping Use   Vaping Use: Every day   Substances: Nicotine, Flavoring  Substance and Sexual Activity   Alcohol use: Not Currently    Comment: quit on 12/06/2022   Drug use: No   Sexual activity: Not on file  Other Topics Concern   Not on file  Social History Narrative   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[employer] Situation: lives alone      Spiritual Beliefs: episcopalian      Lifestyle: had been going to Y but currently dealing with meniscal tear; diet good            Social Determinants of Health   Financial Resource Strain: Low Risk  (01/12/2023)   Overall Financial Resource Strain (CARDIA)    Difficulty of Paying Living Expenses: Not very hard  Food Insecurity: No Food Insecurity (01/12/2023)   Hunger Vital Sign    Worried About Running Out of Food in the Last Year: Never true    Ran Out of Food in the Last Year: Never true  Transportation Needs: No Transportation Needs (01/12/2023)   PRAPARE - Administrator, Civil Service (Medical): No    Lack of Transportation (Non-Medical): No  Physical Activity: Sufficiently Active (10/05/2022)   Exercise Vital Sign    Days of Exercise per Week: 4 days    Minutes of Exercise per Session: 150+ min  Stress: Stress Concern Present (10/05/2022)   [employer] of Occupational Health - Occupational Stress Questionnaire    Feeling of Stress : To some extent  Social Connections: Not on file  Intimate Partner Violence: Not on file     OBSERVATIONS/OBJECTIVE:  BP (!) 140/79 (BP Location: Left Arm, Patient Position: Sitting)   Pulse 95   Temp 98.2 F (36.8 C) (Temporal)   Resp 16   Wt 146 lb 3.2 oz (66.3 kg)   SpO2 99%   BMI 25.49 kg/m  GENERAL: Patient is a well appearing female in no acute distress HEENT:  Sclerae anicteric.  Oropharynx clear and moist. No ulcerations or evidence of oropharyngeal candidiasis. Neck is supple.  NODES:  No cervical, supraclavicular, or axillary  lymphadenopathy palpated.  BREAST EXAM: deferred LUNGS:  Clear to auscultation bilaterally.  No wheezes or rhonchi. HEART:  Regular rate and rhythm. No murmur appreciated. ABDOMEN:  Soft, nontender.  Positive, normoactive bowel sounds. No organomegaly palpated. MSK:  No focal spinal tenderness to palpation. Full range of motion bilaterally in the upper extremities. EXTREMITIES:  No peripheral edema.   SKIN:  Clear with no obvious rashes or skin changes. No nail dyscrasia. NEURO:  Nonfocal. Well oriented.  Appropriate affect.  LABORATORY DATA:  None for this visit.  DIAGNOSTIC IMAGING:  None for this visit.      ASSESSMENT AND PLAN:  Ms.. Desio is a pleasant 72 y.o. female with Stage IA left breast invasive ductal carcinoma, ER+/PR+/HER2-, diagnosed in 09/2022, treated with lumpectomy, adjuvant radiation therapy, and anti-estrogen therapy with Anastrozole beginning in 12/2022.  She presents to the Survivorship Clinic for our initial meeting and routine follow-up post-completion of treatment for breast cancer.    1. Stage IA left breast cancer:  [name] is continuing to recover from definitive treatment for breast cancer. She will follow-up with her medical oncologist, Dr. Mosetta Putt in 06/2023 with history and physical exam per surveillance protocol.  She will continue her anti-estrogen therapy with Anastrozole. Thus far, she is tolerating the Anastrozole well, with minimal side effects. She was instructed to make Dr. Pamelia Hoit or myself aware if she begins to experience any worsening side effects of the medication and I could see her back in clinic to help manage those side effects, as needed. Her mammogram is due 08/2023; orders placed today. Today, a comprehensive survivorship care plan and treatment summary was reviewed with the patient today detailing her breast cancer diagnosis, treatment course, potential late/long-term effects of treatment, appropriate follow-up care with recommendations  for the future, and patient education resources.  A copy of this summary, along with a letter will be sent to the patient's primary care provider via mail/fax/In Basket message after today's visit.    2. Bone health:  Given Ms. Strohman's age/history of breast cancer and her current treatment regimen including anti-estrogen therapy with Anastrozole, she is at risk for bone demineralization.  Her last DEXA scan was 07/22/2021 which showed osteopenia with a t score of -2.3 in the left femoral neck.  Repeat has been ordered by Dr. Mosetta Putt to occur in 06/2023.  She was given education on specific activities to promote bone health.  3. Cancer screening:  Due to Ms. Butch's history and her age, she should receive screening for skin cancers, colon cancer, lung cancer, and gynecologic cancers.  The information and recommendations are listed on the patient's comprehensive care plan/treatment summary and were reviewed in detail with the patient.    4. Health maintenance and wellness promotion: Ms. Clure was encouraged to consume 5-7 servings of fruits and vegetables per day. We reviewed the "Nutrition Rainbow" handout.  She was also encouraged to engage in moderate to vigorous exercise for 30 minutes per day most days of the week.  She was instructed to limit her alcohol consumption and continue to abstain from tobacco use.     5. Support services/counseling: It is not uncommon for this period of the patient's cancer care trajectory to be one of many emotions and stressors.    She was given information regarding our available services and encouraged to contact me with any questions or for help enrolling in any of our support group/programs.    Follow up instructions:    -Return to cancer center 06/2023 for f/u with Dr. Mosetta Putt  -Mammogram due in 08/2023 -Bone density 06/2023 -She is welcome to return back to the Survivorship Clinic at any time; no additional follow-up needed at this time.  -Consider referral back  to survivorship as  a long-term survivor for continued surveillance  The patient was provided an opportunity to ask questions and all were answered. The patient agreed with the plan and demonstrated an understanding of the instructions.   Total encounter time:40 minutes*in face-to-face visit time, chart review, lab review, care coordination, order entry, and documentation of the encounter time.    Lillard Anes, NP 04/06/23 2:27 PM Medical Oncology and Hematology [facility] [address] [place], [place] [address] Tel. [phone]    Fax. [phone]  *Total Encounter Time as defined by the Centers for Medicare and Medicaid Services includes, in addition to the face-to-face time of a patient visit (documented in the note above) non-face-to-face time: obtaining and reviewing outside history, ordering and reviewing medications, tests or procedures, care coordination (communications with other health care professionals or caregivers) and documentation in the medical record.

## 2023-04-20 ENCOUNTER — Telehealth: Payer: Self-pay | Admitting: Physician Assistant

## 2023-04-20 NOTE — Telephone Encounter (Signed)
Margaret Mckenzie called around 2:40  today. She requested an appt with Rosey Bath b/c she has started drinking again and wants to get some help stopping. Rosey Bath has Rxed something for cravings in past and she would like to get that med sent in. I did schedule for 5/31. [facility]

## 2023-04-20 NOTE — Telephone Encounter (Signed)
Please see message from patient. Filled January 2024.

## 2023-04-24 ENCOUNTER — Other Ambulatory Visit: Payer: Self-pay | Admitting: Physician Assistant

## 2023-04-24 MED ORDER — ACAMPROSATE CALCIUM 333 MG PO TBEC: 666.0000 mg | DELAYED_RELEASE_TABLET | Freq: Three times a day (TID) | ORAL | 1 refills | Status: DC

## 2023-04-24 NOTE — Telephone Encounter (Signed)
Patient notified of Rx and instructions.

## 2023-04-24 NOTE — Telephone Encounter (Signed)
LVM to RC 

## 2023-04-24 NOTE — Telephone Encounter (Signed)
Please let her know I sent in Accamprosate again. Have her take 1 pill tid for 2 days and then go up to 2 tid.

## 2023-04-25 ENCOUNTER — Telehealth: Payer: Self-pay

## 2023-04-25 NOTE — Progress Notes (Signed)
Care Management & Coordination Services Pharmacy Team  Reason for Encounter: Hypertension  Contacted patient to discuss hypertension disease state. Spoke with patient on 04/25/2023   Current antihypertensive regimen:  Cardizem 30 mg 1 q 4 hrs prn Afib DILR-XR 180 mg daily Lisinopril 20 mg daily Patient verbally confirms she is taking the above medications as directed. Yes  How often are you checking your Blood Pressure? Patient has not been checking her blood pressures at home, her last home readings was around 3 months ago.   What recent interventions/DTPs have been made by any provider to improve Blood Pressure control since last CPP Visit: No recent interventions  Any recent hospitalizations or ED visits since last visit with CPP? No recent hospital visits.   What diet changes have been made to improve Blood Pressure Control?  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 Lunch - a meat, vegetable and sometimes a starch Dinner - doesn't eat dinner Caffeine intake - 2 cups coffee daily Salt intake - no restriction  What exercise is being done to improve your Blood Pressure Control?  Patient walks 3 miles daily and other various exercises.   Adherence Review: Is the patient currently on ACE/ARB medication? Yes Does the patient have >5 day gap between last estimated fill dates? No  Care Gaps: AWV - completed 12/13/2022 Last BP - 140/79 on 04/06/2023 Lung Ca Screen - never done Covid - overdue  Star Rating Drugs: Lisinopril 20 mg - last filled 04/16/2023 13 DS [facility]  Chart Updates: Recent office visits:  None  Recent consult visits:  04/06/2023 Lillard Anes NP (hem/onc) - Patient was seen for Malignant neoplasm of upper-outer quadrant of left breast in female, estrogen receptor positive. Discontinued Calcium, Doxycycline, Ibuprofen and Oxycodone.  02/09/2023 Melony Overly PA-C (psych) -  Patient was seen for Bipolar I disorder and additional concerns. Changed Diazepam to 5-10 mg q 12 hrs prn.  Hospital visits:  None  Medications: Outpatient Encounter Medications as of 04/25/2023  Medication Sig Note   acamprosate (CAMPRAL) 333 MG tablet Take 2 tablets (666 mg total) by mouth 3 (three) times daily with meals.    anastrozole (ARIMIDEX) 1 MG tablet TAKE 1 TABLET BY MOUTH EVERY DAY    Ascorbic Acid (VITAMIN C) 1000 MG tablet Take 1,000 mg by mouth at bedtime.    busPIRone (BUSPAR) 15 MG tablet Take 1 tablet (15 mg total) by mouth daily.    Calcium Carbonate (CALCIUM 500 PO) Take 1 tablet by mouth at bedtime.    Cholecalciferol (VITAMIN D-3) 25 MCG (1000 UT) CAPS Take 4,000 Units by mouth at bedtime.    cyanocobalamin (VITAMIN B12) 1000 MCG/ML injection INJECT 1 ML (1,000 MCG) INTRAMUSCULARLY EVERY 30 DAYS    diazepam (VALIUM) 10 MG tablet Take 0.5-1 tablets (5-10 mg total) by mouth every 12 (twelve) hours as needed for anxiety.    DILT-XR 180 MG 24 hr capsule TAKE 1 CAPSULE BY MOUTH EVERY DAY    diltiazem (CARDIZEM) 30 MG tablet Take 1 tablet every 4 hours AS NEEDED for AFIB heart rate >100 (Patient not taking: Reported on 02/09/2023) 04/06/2023: Patient reports she has not needed it   ELIQUIS 5 MG TABS tablet TAKE 1 TABLET BY MOUTH TWICE A DAY    lamoTRIgine (LAMICTAL) 150 MG tablet Take 2 tablets (300 mg total) by mouth at bedtime.    lisinopril (ZESTRIL) 20 MG tablet TAKE 1 TABLET BY MOUTH EVERY  DAY    Magnesium 400 MG CAPS Take 400 mg by mouth at bedtime.    risperiDONE (RISPERDAL) 3 MG tablet Take 1 tablet (3 mg total) by mouth at bedtime.    thiamine (VITAMIN B1) 100 MG tablet Take 100 mg by mouth daily.    traZODone (DESYREL) 50 MG tablet Take 1-2 tablets (50-100 mg total) by mouth at bedtime as needed. for sleep    No facility-administered encounter medications on file as of 04/25/2023.  Fill History:   Dispensed Days Supply Quantity Provider Pharmacy  ACAMPROSATE 333  MG TABLET, DELAYED RELEASE (ENTERIC COATED) 01/02/2023 30 180 tablet      Dispensed Days Supply Quantity Provider Pharmacy  ANASTROZOLE 1 MG TABLET 04/01/2023 90 90 each      Dispensed Days Supply Quantity Provider Pharmacy  ELIQUIS 5 MG TABLET 01/26/2023 90 180 each      Dispensed Days Supply Quantity Provider Pharmacy  BUSPIRONE HCL 15 MG TABLET 04/06/2023 90 90 each      Dispensed Days Supply Quantity Provider Pharmacy  CYANOCOBALAMIN 1,000 MCG/ML VL 02/18/2023 84 3 mL      Dispensed Days Supply Quantity Provider Pharmacy  DIAZEPAM 10 MG TABLET 02/15/2023 15 30 each      Dispensed Days Supply Quantity Provider Pharmacy  DILT XR 180 MG CAPSULE 02/24/2023 64 64 each      Dispensed Days Supply Quantity Provider Pharmacy  LAMOTRIGINE 150 MG TABLET 01/25/2023 90 180 each      Dispensed Days Supply Quantity Provider Pharmacy  LISINOPRIL 20 MG TABLET 04/16/2023 13 13 each      Dispensed Days Supply Quantity Provider Pharmacy  RISPERIDONE 3 MG TABLET 02/09/2023 90 90 each      Dispensed Days Supply Quantity Provider Pharmacy  TRAZODONE 50 MG TABLET 03/03/2023 90 180 each     Recent Office Vitals: BP Readings from Last 3 Encounters:  04/06/23 (!) 140/79  01/04/23 133/87  12/13/22 135/89   Pulse Readings from Last 3 Encounters:  04/06/23 95  01/04/23 84  12/13/22 88    Wt Readings from Last 3 Encounters:  04/06/23 146 lb 3.2 oz (66.3 kg)  01/04/23 165 lb 4.8 oz (75 kg)  12/13/22 170 lb (77.1 kg)     Kidney Function Lab Results  Component Value Date/Time   CREATININE 0.67 12/13/2022 01:24 PM   CREATININE 0.65 11/06/2022 09:13 AM   CREATININE 0.70 10/11/2022 12:05 PM   GFR 88.43 12/13/2022 01:24 PM   GFRNONAA >60 10/31/2022 12:31 PM   GFRNONAA >60 10/11/2022 12:05 PM   GFRAA 99 09/08/2019 09:16 AM       Latest Ref Rng & Units 12/13/2022    1:24 PM 11/06/2022    9:13 AM 10/31/2022   12:31 PM  BMP  Glucose 70 - 99 mg/dL 96  81  161   BUN 6 - 23 mg/dL 5  5  <5    Creatinine 0.40 - 1.20 mg/dL 0.96  0.45  4.09   Sodium 135 - 145 mEq/L 136  135  138   Potassium 3.5 - 5.1 mEq/L 3.7  4.2  4.6   Chloride 96 - 112 mEq/L 98  95  100   CO2 19 - 32 mEq/L 31  30  27    Calcium 8.4 - 10.5 mg/dL 9.6  9.5  9.6    Inetta Fermo [facility]  Clinical Pharmacist Assistant [phone]

## 2023-04-27 ENCOUNTER — Ambulatory Visit (INDEPENDENT_AMBULATORY_CARE_PROVIDER_SITE_OTHER): Payer: PPO | Admitting: Physician Assistant

## 2023-04-27 ENCOUNTER — Encounter: Payer: Self-pay | Admitting: Physician Assistant

## 2023-04-27 DIAGNOSIS — F411 Generalized anxiety disorder: Secondary | ICD-10-CM

## 2023-04-27 DIAGNOSIS — Z87898 Personal history of other specified conditions: Secondary | ICD-10-CM

## 2023-04-27 DIAGNOSIS — F319 Bipolar disorder, unspecified: Secondary | ICD-10-CM

## 2023-04-27 MED ORDER — CLONAZEPAM 1 MG PO TABS: 1.0000 mg | ORAL_TABLET | Freq: Two times a day (BID) | ORAL | 1 refills | Status: DC | PRN

## 2023-04-27 MED ORDER — BUSPIRONE HCL 15 MG PO TABS: 15.0000 mg | ORAL_TABLET | Freq: Two times a day (BID) | ORAL | 0 refills | Status: DC

## 2023-04-27 NOTE — Progress Notes (Signed)
[facility] Med Check  Patient ID: Margaret Mckenzie,  MRN: [id number]  PCP: Philip Aspen, Limmie Patricia, MD  Date of Evaluation: 04/27/2023 Time spent:30 minutes  Chief Complaint:  Chief Complaint   Anxiety; Depression; Follow-up    HISTORY/CURRENT STATUS: HPI  For routine med check.  She has been craving alcohol more.  She called last week and requested getting back on a acamprosate.  It was helpful in the past.  She has not been drinking except had 1 glass of wine last night.   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  That has been very stressful.  She is a Production assistant, radio.  She gets panicky at times, the Valium does not help like it did in the past.  Patient is able to enjoy things.  Energy and motivation are good.  No extreme sadness, tearfulness, or feelings of hopelessness.  Sleeps well most of the time. ADLs and personal hygiene are normal.   Denies any changes in concentration, making decisions, or remembering things.  Appetite has not changed.  Weight is stable.  Denies suicidal or homicidal thoughts.  Patient denies increased energy with decreased need for sleep, increased talkativeness, racing thoughts, impulsivity or risky behaviors, increased spending, increased libido, grandiosity, increased irritability or anger, paranoia, or hallucinations.  Review of Systems  Constitutional: Negative.   HENT: Negative.    Eyes: Negative.   Respiratory: Negative.    Cardiovascular: Negative.   Gastrointestinal: Negative.   Genitourinary: Negative.   Musculoskeletal: Negative.   Skin: Negative.   Neurological: Negative.   Endo/Heme/Allergies: Negative.   Psychiatric/Behavioral:         See HPI   Individual Medical History/ Review of Systems: Changes? :No     Past medications for mental health diagnoses include: Depakote, Lamictal, Prozac, Lexapro, Xanax, Sonata caused amnesia, Risperdal,  Trazodone ineffective at low doses but caused dizziness at higher doses.  Librium taper for detox, toward the end of the treatment she had issues with balance.  Acamprosate, questionably caused imbalance.  Allergies: Nefazodone and Trazodone  Current Medications:  Current Outpatient Medications:    anastrozole (ARIMIDEX) 1 MG tablet, TAKE 1 TABLET BY MOUTH EVERY DAY, Disp: 90 tablet, Rfl: 1   Ascorbic Acid (VITAMIN C) 1000 MG tablet, Take 1,000 mg by mouth at bedtime., Disp: , Rfl:    Calcium Carbonate (CALCIUM 500 PO), Take 1 tablet by mouth at bedtime., Disp: , Rfl:    Cholecalciferol (VITAMIN D-3) 25 MCG (1000 UT) CAPS, Take 4,000 Units by mouth at bedtime., Disp: , Rfl:    clonazePAM (KLONOPIN) 1 MG tablet, Take 1 tablet (1 mg total) by mouth 2 (two) times daily as needed for anxiety. And may take an extra 1/2 pill mid-day prn, Disp: 75 tablet, Rfl: 1   cyanocobalamin (VITAMIN B12) 1000 MCG/ML injection, INJECT 1 ML (1,000 MCG) INTRAMUSCULARLY EVERY 30 DAYS, Disp: 9 mL, Rfl: 1   DILT-XR 180 MG 24 hr capsule, TAKE 1 CAPSULE BY MOUTH EVERY DAY, Disp: 90 capsule, Rfl: 3   ELIQUIS 5 MG TABS tablet, TAKE 1 TABLET BY MOUTH TWICE A DAY, Disp: 180 tablet, Rfl: 3   lamoTRIgine (LAMICTAL) 150 MG tablet, Take 2 tablets (300 mg total) by mouth at bedtime., Disp: 180 tablet, Rfl: 1   lisinopril (ZESTRIL) 20 MG tablet, TAKE 1 TABLET BY MOUTH EVERY DAY, Disp: 90 tablet, Rfl: 1   Magnesium 400 MG CAPS, Take 400 mg by mouth  at bedtime., Disp: , Rfl:    risperiDONE (RISPERDAL) 3 MG tablet, Take 1 tablet (3 mg total) by mouth at bedtime., Disp: 90 tablet, Rfl: 1   thiamine (VITAMIN B1) 100 MG tablet, Take 100 mg by mouth daily., Disp: , Rfl:    traZODone (DESYREL) 50 MG tablet, Take 1-2 tablets (50-100 mg total) by mouth at bedtime as needed. for sleep, Disp: 180 tablet, Rfl: 1   acamprosate (CAMPRAL) 333 MG tablet, Take 2 tablets (666 mg total) by mouth 3 (three) times daily with meals. (Patient not taking:  Reported on 04/27/2023), Disp: 180 tablet, Rfl: 1   busPIRone (BUSPAR) 15 MG tablet, Take 1 tablet (15 mg total) by mouth 2 (two) times daily., Disp: 90 tablet, Rfl: 0   diltiazem (CARDIZEM) 30 MG tablet, Take 1 tablet every 4 hours AS NEEDED for AFIB heart rate >100 (Patient not taking: Reported on 02/09/2023), Disp: 45 tablet, Rfl: 1 Medication Side Effects: none  Family Medical/ Social History: Changes?  No  MENTAL HEALTH EXAM:  There were no vitals taken for this visit.There is no height or weight on file to calculate BMI.  General Appearance: Casual, Neat, and Well Groomed  Eye Contact:  Good  Speech:  Clear and Coherent and Normal Rate  Volume:  Normal  Mood:  Anxious  Affect:  Appropriate  Thought Process:  Goal Directed and Descriptions of Associations: Circumstantial  Orientation:  Full (Time, Place, and Person)  Thought Content: Logical   Suicidal Thoughts:  No  Homicidal Thoughts:  No  Memory:  WNL  Judgement:  Good  Insight:  Good  Psychomotor Activity:  Normal  Concentration:  Concentration: Good and Attention Span: Good  Recall:  Good  Fund of Knowledge: Good  Language: Good  Assets:  Desire for Improvement Financial Resources/Insurance Housing Transportation Vocational/Educational  ADL's:  Intact  Cognition: WNL  Prognosis:  Good   DIAGNOSES:    ICD-10-CM   1. Generalized anxiety disorder  F41.1     2. Bipolar I disorder (HCC)  F31.9     3. History of alcohol use disorder  Z87.898       Receiving Psychotherapy: Yes   Matt Schooler  RECOMMENDATIONS:  PDMP was reviewed.  Last Valium filled 02/15/2023. I provided 30 minutes of face to face time during this encounter, including time spent before and after the visit in records review, medical decision making, counseling pertinent to today's visit, and charting.   We discussed the anxiety.  We will discontinue Valium and change to Klonopin since it lasts longer.  She is not back on the acamprosate yet, her  pharmacy had order it.  Will start it tonight or tomorrow though.  Discontinue Valium. Restart acamprosate 333 mg, 2 p.o. 3 times daily. Increase BuSpar 15 mg to 1 p.o. every morning and 1/2 pill nightly for 1 week and then increase to 1 p.o. twice daily. Start Klonopin 1 mg, 1 p.o. twice daily as needed and may take 1/2 pill midday as needed. Continue Lamictal 150 mg, 2 p.o. nightly. Continue Risperdal 3 mg, 1 p.o. nightly. Continue trazodone 50 mg, 1/2-2 p.o. nightly as needed sleep. Continue therapy with Georgiann Mohs.  Re-start AA or Cele[facility]. Return in 6 weeks.   Melony Overly, PA-C

## 2023-04-28 ENCOUNTER — Other Ambulatory Visit: Payer: Self-pay | Admitting: Internal Medicine

## 2023-04-28 DIAGNOSIS — I10 Essential (primary) hypertension: Secondary | ICD-10-CM

## 2023-05-01 ENCOUNTER — Encounter: Payer: Self-pay | Admitting: Internal Medicine

## 2023-05-14 ENCOUNTER — Telehealth: Payer: Self-pay | Admitting: Physician Assistant

## 2023-05-14 NOTE — Telephone Encounter (Signed)
Is she taking a total of 2.5 pills/day? If not, go up to that, if so, ok to take 1 po tid prn but we will decrease dose in 3-4 weeks. Also increase Buspar 15 mg to tid.

## 2023-05-14 NOTE — Telephone Encounter (Signed)
Margaret Mckenzie called at 12:00 wanting to know if she can take more than 1 Klonopin per day.  Please call to advise. Next appt 7/15

## 2023-05-14 NOTE — Telephone Encounter (Signed)
Patient is asking to increase her clonazepam from 2 a day to 3 a day prn for approximately 3 weeks. She reports trying to get a reverse mortgage on her house and has a lot of paperwork to do. She said this is causing panic attacks and said if she doesn't have something to calm her down she is going to start drinking again.  LF 5/31.

## 2023-05-15 ENCOUNTER — Encounter: Payer: Self-pay | Admitting: Internal Medicine

## 2023-05-15 DIAGNOSIS — M549 Dorsalgia, unspecified: Secondary | ICD-10-CM

## 2023-05-15 NOTE — Telephone Encounter (Signed)
LVM to RC 

## 2023-05-15 NOTE — Telephone Encounter (Signed)
Patient reported only taking 1 tablet a day. She said directions were not clear on the bottle. Reviewed instructions with patient. It was prescribed for her to take 2.5 tabs daily prn.

## 2023-05-16 ENCOUNTER — Telehealth: Payer: Self-pay | Admitting: Cardiology

## 2023-05-16 NOTE — Telephone Encounter (Signed)
Pt c/o medication issue:   1. Name of Medication: ELIQUIS 5 MG TABS tablet [[id number]]   2. How are you currently taking this medication (dosage and times per day)?   3. Are you having a reaction (difficulty breathing--STAT)? na  4. What is your medication issue? Pt stated for this refill going forward, she needs just 30 day at a time so she does not fall into the "donut hole".  She stated she gets this 3 Month at a time   [facility] [address]

## 2023-05-16 NOTE — Telephone Encounter (Signed)
Patient does not need refill at this time. She just picked up last refill  and she have a 3 month supply. She will give [place] a call to when its time for her next refill

## 2023-05-22 ENCOUNTER — Other Ambulatory Visit: Payer: Self-pay

## 2023-05-22 ENCOUNTER — Encounter: Payer: Self-pay | Admitting: Rehabilitative and Restorative Service Providers"

## 2023-05-22 ENCOUNTER — Ambulatory Visit: Payer: PPO | Attending: Internal Medicine | Admitting: Rehabilitative and Restorative Service Providers"

## 2023-05-22 DIAGNOSIS — M5459 Other low back pain: Secondary | ICD-10-CM

## 2023-05-22 DIAGNOSIS — M549 Dorsalgia, unspecified: Secondary | ICD-10-CM | POA: Insufficient documentation

## 2023-05-22 DIAGNOSIS — M6281 Muscle weakness (generalized): Secondary | ICD-10-CM

## 2023-05-22 DIAGNOSIS — R252 Cramp and spasm: Secondary | ICD-10-CM

## 2023-05-22 DIAGNOSIS — R262 Difficulty in walking, not elsewhere classified: Secondary | ICD-10-CM

## 2023-05-22 NOTE — Patient Instructions (Signed)

## 2023-05-22 NOTE — Therapy (Signed)
OUTPATIENT PHYSICAL THERAPY THORACOLUMBAR EVALUATION   Patient Name: Margaret Mckenzie MRN: [id number] DOB:05-Apr-1951, 72 y.o., female Today's Date: 05/22/2023  END OF SESSION:  PT End of Session - 05/22/23 1225     Visit Number 1    Date for PT Re-Evaluation 07/13/23    Authorization Type [employer]    Progress Note Due on Visit 10    PT Start Time 1219    PT Stop Time 1310    PT Time Calculation (min) 51 min    Activity Tolerance Patient tolerated treatment well    Behavior During Therapy WFL for tasks assessed/performed             Past Medical History:  Diagnosis Date   Anxiety    Bipolar 1 disorder (HCC)    Chicken pox    Depression    Hypertension    high blood pressure readings    Kidney disease    kidney stones and saw urologist, Dr. Dillard Cannon for lithotripsy remotely    PAF (paroxysmal atrial fibrillation) [facility])    Past Surgical History:  Procedure Laterality Date   BREAST BIOPSY  10/31/2022   MM LT RADIOACTIVE SEED LOC MAMMO GUIDE 10/31/2022 [facility] MAMMOGRAP[facility]   BREAST LUMPECTOMY WITH RADIOACTIVE SEED LOCALIZATION Left 11/02/2022   Procedure: LEFT BREAST LUMPECTOMY WITH RADIOACTIVE SEED LOCALIZATION;  Surgeon: Harriette Bouillon, MD;  Location: [facility];  Service: General;  Laterality: Left;   BUNIONECTOMY  2007   TONSILLECTOMY AND ADENOIDECTOMY  1975   Patient Active Problem List   Diagnosis Date Noted   NASH (nonalcoholic steatohepatitis) 12/11/2022   Transaminitis 11/09/2022   Malignant neoplasm of upper-outer quadrant of left breast in female, estrogen receptor positive (HCC) 10/09/2022   Chronic atrial fibrillation (HCC) 05/08/2022   Hyponatremia 07/15/2021   Generalized anxiety disorder 10/25/2020   Panic disorder 10/25/2020   Bipolar I disorder (HCC) 10/25/2020   Insomnia 10/25/2020   Vitamin B12 deficiency 04/23/2020   Paroxysmal atrial fibrillation (HCC) 08/31/2017   Essential hypertension 08/31/2017    PCP:  Philip Aspen, Limmie Patricia, MD  REFERRING PROVIDER: Philip Aspen, Limmie Patricia, MD  REFERRING DIAG: M54.9 (ICD-10-CM) - Back pain, unspecified back location, unspecified back pain laterality, unspecified chronicity  Rationale for Evaluation and Treatment: Rehabilitation  THERAPY DIAG:  Other low back pain  Cramp and spasm  Difficulty in walking, not elsewhere classified  Muscle weakness (generalized)  ONSET DATE: a few weeks ago, unknown origin  SUBJECTIVE:  SUBJECTIVE STATEMENT: Pt reports that she has been doing her HEP from last PT session and things have been going well.  However, a few weeks ago, she started having pain again.  States that dry needling was very helpful last time, and she is hoping to have that again.  PERTINENT HISTORY:  Breast Cancer in October 2023 with lumpectomy without lymph node removal.  Injections years ago.  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  Had back issues but the injections solved her pain and she did not require further intervention.   PAIN:  Are you having pain? Yes: NPRS scale: Generally 4/10, ranges from 0-10/10 Pain location: low back radiating down left LE Pain description: dull and occasionally sharp Aggravating factors: going from sit to stand Relieving factors: standing up, dry needling in the past, walking  PRECAUTIONS: None  WEIGHT BEARING RESTRICTIONS: No  FALLS:  Has patient fallen in last 6 months? 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  NEXT MD VISIT: As needed, but October for annual physical  OBJECTIVE:   DIAGNOSTIC FINDINGS:  Lumbar MRI on 03/07/2007 IMPRESSION:  1. The dominant finding is focal and intense bony edema in the right aspect of S1.  Edema is also noted in the right pedicle of L5 and about the right facet joint.  All of this bone edema may be related to facet arthropathy although a sacral insufficiency fracture of S1 is within the differential.  Appearances are not suggestive of a neoplastic process.   2.  Mild degenerative disk disease at L4-5 and L5-S1 without notable central canal or foraminal stenosis.   PATIENT SURVEYS:  FOTO 61% (projected 71% by visit 9)  SCREENING FOR RED FLAGS: Bowel or bladder incontinence: No Spinal tumors: No Cauda equina syndrome: No Compression fracture: No Abdominal aneurysm: No  COGNITION: Overall cognitive status: Within functional limits for tasks assessed     SENSATION: Within normal limits accept L2-3 patient identified Left as 2-3% different, took patient long to identify difference   MUSCLE LENGTH: Hamstrings: Right 55 deg; Left 50 deg   POSTURE: rounded shoulders, forward head, and flexed trunk   LUMBAR ROM:   WFL  LOWER EXTREMITY ROM:     WFL  LOWER EXTREMITY MMT:    Eval: Bilateral hip abduction strength is 3-/5 Left Hip flexion 3/5, Right hip flexion 5/5 Left knee flexion 3-/5, right knee flexion 3-/5 Bilateral knee extension 5/5 Bilateral Hip internal rotation 5/5 Bilateral Hip external rotation 5/5  LUMBAR SPECIAL TESTS:  Eval:  FABER test: Positive (tighter on right, but positive bilaterally)  FUNCTIONAL TESTS:  Eval:   5 times sit to stand: 8.45 sec Timed up and go (TUG): 10.35 sec  GAIT: Distance walked: greater than 180feet Assistive device utilized: None Level of assistance: Complete Independence Comments: observed  deviations in left hip externally rotated with excessive pronation due to left greater toe bunion, left hip drop   TODAY'S TREATMENT:  DATE: 05/22/2023 Reviewed HEP, see below Trigger Point Dry-Needling  Treatment instructions: Expect mild to moderate muscle soreness. S/S of pneumothorax if dry needled over a lung field, and to seek immediate medical attention should they occur. Patient verbalized understanding of these instructions and education. Patient Consent Given: Yes Education handout provided: Yes Muscles treated: bilateral lumbar multifidi, bilateral glutes/piriformis Electrical stimulation performed: No Parameters: N/A Treatment response/outcome: Utilized skilled palpation to identify bony landmarks and trigger points.  Able to illicit twitch response and muscle elongation.  Soft tissue mobilization following to further promote tissue elongation.     PATIENT EDUCATION:  Education details: Issued HEP Person educated: Patient Education method: Explanation, Facilities manager, and Handouts Education comprehension: verbalized understanding  HOME EXERCISE PROGRAM: Access Code: [email or web address] URL: [email or web address] Date: 05/22/2023 Prepared by: Clydie Braun Yvana Samonte  Exercises - Supine Hamstring Stretch with Strap  - 1 x daily - 7 x weekly - 1 sets - 3 reps - 30 sec hold - Supine ITB Stretch with Strap  - 1 x daily - 7 x weekly - 1 sets - 3 reps - 30sec hold - Supine Piriformis Stretch Pulling Heel to Hip  - 1 x daily - 7 x weekly - 1 sets - 3 reps - 30 sec hold - Seated Figure 4 Piriformis Stretch  - 1 x daily - 7 x weekly - 1 sets - 3 reps - 30 sec hold - Supine Posterior Pelvic Tilt  - 1 x daily - 7 x weekly - 3 sets - 10 reps - Supine 90/90 Alternating Heel Touches with Posterior Pelvic Tilt  - 1 x daily - 7 x weekly - 3 sets - 10 reps - Supine Dead Bug  with Leg Extension  - 1 x daily - 7 x weekly - 3 sets - 10 reps - Supine Lower Trunk Rotation  - 1 x daily - 7 x weekly - 3 sets - 10 reps - Supine Piriformis Stretch Pulling Heel to Hip  - 1 x daily - 7 x weekly - 3 sets - 3 reps - 30 hold - Supine Hip External Rotation  - 1 x daily - 7 x weekly - 1 sets - 60 hold - Bent Knee Fallouts  - 1 x daily - 7 x weekly - 2 sets - 10 reps  ASSESSMENT:  CLINICAL IMPRESSION: Patient is a 72 y.o. female who was seen today for physical therapy evaluation and treatment for low back pain. Pt's PLOF is independent with all functional tasks and working 5 days per week serving breakfast at [employer].  Patient presents with difficulty walking with pain during sit to stand transfer and supine to sit transfer.  Pain alleviates quickly following transitions.  Patient presents with muscle weakness, abnormal posture, muscle spasms, and difficulty with transfers.  Pt has had success with dry needling in the past and is agreeable to treatment again.  Patient with good twitch responses noted throughout on bilateral sides.  Patient would benefit from skilled PT to address her functional impairments to allow her to return to decreased pain with functional tasks.  OBJECTIVE IMPAIRMENTS: decreased balance, difficulty walking, decreased strength, increased muscle spasms, impaired flexibility, postural dysfunction, and pain.   ACTIVITY LIMITATIONS: bending and transfers  PARTICIPATION LIMITATIONS: cleaning and community activity  PERSONAL FACTORS: Past/current experiences, Time since onset of injury/illness/exacerbation, and 1-2 comorbidities: Hx of breast cancer, Hx of low back pain  are also affecting patient's functional outcome.   REHAB POTENTIAL: Good  CLINICAL DECISION MAKING: Stable/uncomplicated  EVALUATION COMPLEXITY:  Low   GOALS: Goals reviewed with patient? No  SHORT TERM GOALS: Target date: 06/08/2023  Pt will be independent with proper technique  with initial HEP. Baseline: Goal status: INITIAL  2.  Assess single leg stance time on each LE to establish a baseline and potential fall risk. Baseline:  Goal status: INITIAL   LONG TERM GOALS: Target date: 07/13/2023  Pt will be independent with advanced HEP. Baseline:  Goal status: INITIAL  2.  Pt will increase increase strength to at least 4/5 throughout to improve her ability to navigate stairs with reciprocal pattern. Baseline:  Goal status: INITIAL  3.  Pt will increase FOTO to at least 71% to demonstrate improvements in functional mobility. Baseline:  Goal status: INITIAL  4.  Pt will be able to transfer from sit to stand and supine to sit without increased pain. Baseline:  Goal status: INITIAL   PLAN:  PT FREQUENCY: 1-2x/week  PT DURATION: 8 weeks  PLANNED INTERVENTIONS: Therapeutic exercises, Therapeutic activity, Neuromuscular re-education, Balance training, Gait training, Patient/Family education, Self Care, Joint mobilization, Joint manipulation, Stair training, Aquatic Therapy, Dry Needling, Electrical stimulation, Spinal manipulation, Spinal mobilization, Cryotherapy, Moist heat, Taping, Traction, Ultrasound, Ionotophoresis 4mg /ml Dexamethasone, Manual therapy, and Re-evaluation.  PLAN FOR NEXT SESSION: Assess and progress HEP as indicated, strengthening, postural/core stability, balance.   Reather Laurence, PT 05/22/2023, 12:27 PM   [facility] [address] [place], [place] [address] Phone # [phone] Fax [phone]

## 2023-05-28 ENCOUNTER — Encounter: Payer: Self-pay | Admitting: Physical Therapy

## 2023-05-28 ENCOUNTER — Ambulatory Visit: Payer: PPO | Attending: Internal Medicine | Admitting: Physical Therapy

## 2023-05-28 DIAGNOSIS — M5459 Other low back pain: Secondary | ICD-10-CM | POA: Diagnosis not present

## 2023-05-28 DIAGNOSIS — R262 Difficulty in walking, not elsewhere classified: Secondary | ICD-10-CM | POA: Diagnosis not present

## 2023-05-28 DIAGNOSIS — M6281 Muscle weakness (generalized): Secondary | ICD-10-CM | POA: Diagnosis not present

## 2023-05-28 DIAGNOSIS — R293 Abnormal posture: Secondary | ICD-10-CM | POA: Diagnosis not present

## 2023-05-28 DIAGNOSIS — R252 Cramp and spasm: Secondary | ICD-10-CM | POA: Insufficient documentation

## 2023-05-28 NOTE — Therapy (Signed)
OUTPATIENT PHYSICAL THERAPY TREATMENT   Patient Name: Margaret Mckenzie MRN: [id number] DOB:Nov 24, 1951, 72 y.o., female Today's Date: 05/28/2023  END OF SESSION:  PT End of Session - 05/28/23 1055     Visit Number 2    Date for PT Re-Evaluation 07/13/23    Authorization Type [employer]    Authorization - Visit Number 2    Progress Note Due on Visit 10    PT Start Time 1050    PT Stop Time 1140    PT Time Calculation (min) 50 min    Activity Tolerance Patient tolerated treatment well    Behavior During Therapy WFL for tasks assessed/performed             Past Medical History:  Diagnosis Date   Anxiety    Bipolar 1 disorder (HCC)    Chicken pox    Depression    Hypertension    high blood pressure readings    Kidney disease    kidney stones and saw urologist, Dr. Dillard Cannon for lithotripsy remotely    PAF (paroxysmal atrial fibrillation) [facility])    Past Surgical History:  Procedure Laterality Date   BREAST BIOPSY  10/31/2022   MM LT RADIOACTIVE SEED LOC MAMMO GUIDE 10/31/2022 [facility] MAMMOGRAP[facility]   BREAST LUMPECTOMY WITH RADIOACTIVE SEED LOCALIZATION Left 11/02/2022   Procedure: LEFT BREAST LUMPECTOMY WITH RADIOACTIVE SEED LOCALIZATION;  Surgeon: Harriette Bouillon, MD;  Location: [facility];  Service: General;  Laterality: Left;   BUNIONECTOMY  2007   TONSILLECTOMY AND ADENOIDECTOMY  1975   Patient Active Problem List   Diagnosis Date Noted   NASH (nonalcoholic steatohepatitis) 12/11/2022   Transaminitis 11/09/2022   Malignant neoplasm of upper-outer quadrant of left breast in female, estrogen receptor positive (HCC) 10/09/2022   Chronic atrial fibrillation (HCC) 05/08/2022   Hyponatremia 07/15/2021   Generalized anxiety disorder 10/25/2020   Panic disorder 10/25/2020   Bipolar I disorder (HCC) 10/25/2020   Insomnia 10/25/2020   Vitamin B12 deficiency 04/23/2020   Paroxysmal atrial fibrillation (HCC) 08/31/2017   Essential hypertension  08/31/2017    PCP: Philip Aspen, Limmie Patricia, MD  REFERRING PROVIDER: Philip Aspen, Limmie Patricia, MD  REFERRING DIAG: M54.9 (ICD-10-CM) - Back pain, unspecified back location, unspecified back pain laterality, unspecified chronicity  Rationale for Evaluation and Treatment: Rehabilitation  THERAPY DIAG:  Other low back pain  Cramp and spasm  Difficulty in walking, not elsewhere classified  Muscle weakness (generalized)  ONSET DATE: a few weeks ago, unknown origin  SUBJECTIVE:  SUBJECTIVE STATEMENT: Pt reports that her pain has been increasing in severity since her evaluation. She has been working on her HEP and is doesn't seem to be causing any pain. Painful to walk and sit. Applying ice.  PERTINENT HISTORY:  Breast Cancer in October 2023 with lumpectomy without lymph node removal.  Injections years ago.  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  Had back issues but the injections solved her pain and she did not require further intervention.   PAIN:  Are you having pain? Yes: NPRS scale: Generally 10/10, ranges from 0-10/10 Pain location: low back radiating down left LE Pain description: dull and occasionally sharp Aggravating factors: going from sit to stand Relieving factors: standing up, dry needling in the past, walking  PRECAUTIONS: None  WEIGHT BEARING RESTRICTIONS: No  FALLS:  Has patient fallen in last 6 months? 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  NEXT MD VISIT: As needed, but October for annual physical  OBJECTIVE:   DIAGNOSTIC FINDINGS:  Lumbar MRI on 03/07/2007 IMPRESSION:  1. The dominant finding is focal and intense bony edema in the right aspect of S1.  Edema is also noted in the right pedicle of L5 and about the right facet joint.  All of this bone edema may be related to facet arthropathy although a sacral insufficiency fracture of S1 is within the differential.  Appearances are not suggestive of a neoplastic process.   2.  Mild degenerative disk disease at L4-5 and L5-S1 without notable central canal or foraminal stenosis.   PATIENT SURVEYS:  FOTO 61% (projected 71% by visit 9)  SCREENING FOR RED FLAGS: Bowel or bladder incontinence: No Spinal tumors: No Cauda equina syndrome: No Compression fracture: No Abdominal aneurysm: No  COGNITION: Overall cognitive status: Within functional limits for tasks assessed     SENSATION: Within normal limits accept L2-3 patient identified Left as 2-3% different, took patient long to identify difference   MUSCLE LENGTH: Hamstrings: Right 55 deg; Left 50 deg   POSTURE: rounded shoulders, forward head, and flexed trunk   LUMBAR ROM:   WFL  LOWER EXTREMITY ROM:     WFL  LOWER EXTREMITY MMT:    Eval: Bilateral hip abduction strength is 3-/5 Left Hip flexion 3/5, Right hip flexion 5/5 Left knee flexion 3-/5, right knee flexion 3-/5 Bilateral knee extension 5/5 Bilateral Hip internal rotation 5/5 Bilateral Hip external rotation 5/5  LUMBAR SPECIAL TESTS:  Eval:  FABER test: Positive (tighter on right, but positive bilaterally)  FUNCTIONAL TESTS:  Eval:   5 times sit to stand: 8.45 sec Timed up and go (TUG): 10.35 sec  GAIT: Distance walked: greater than 168feet Assistive device utilized: None Level of assistance: Complete Independence Comments: observed deviations in left hip  externally rotated with excessive pronation due to left greater toe bunion, left hip drop   TODAY'S TREATMENT:  DATE:  05/28/23 Tenderness with palpation Lt L5/S1 paraspinals.  Ice pack applied to Lt lumbar region throughout supine portion of session Supine bridge (+) pain on Lt Lt low lumbar rotation opening mobilization x3 reps  Lt hip extension contract/relax/stretch 5 sec hold, 10 sec stretch Attempted Lt hip extension MWM but pt unable to tolerate setup without increase in pain.  STM Lt medial/proximal glutes, Lt lumbar paraspinals Trigger Point Dry-Needling  Treatment instructions: Expect mild to moderate muscle soreness. S/S of pneumothorax if dry needled over a lung field, and to seek immediate medical attention should they occur. Patient verbalized understanding of these instructions and education. Patient Consent Given: Yes Education handout provided: Yes Muscles treated: Lt L4, L5, S1 lumbar multifidi Electrical stimulation performed: No Parameters: N/A Treatment response/outcome: Utilized skilled palpation to identify bony landmarks and trigger points.  Able to illicit twitch response and muscle elongation.  Soft tissue mobilization following to further promote tissue elongation.   05/22/2023 Reviewed HEP, see below Trigger Point Dry-Needling  Treatment instructions: Expect mild to moderate muscle soreness. S/S of pneumothorax if dry needled over a lung field, and to seek immediate medical attention should they occur. Patient verbalized understanding of these instructions and education. Patient Consent Given: Yes Education handout provided: Yes Muscles treated: bilateral lumbar multifidi, bilateral glutes/piriformis Electrical stimulation performed: No Parameters: N/A Treatment response/outcome: Utilized skilled palpation to identify bony landmarks and  trigger points.  Able to illicit twitch response and muscle elongation.  Soft tissue mobilization following to further promote tissue elongation.     PATIENT EDUCATION:  Education details: Issued HEP Person educated: Patient Education method: Explanation, Facilities manager, and Handouts Education comprehension: verbalized understanding  HOME EXERCISE PROGRAM: Access Code: [email or web address] URL: [email or web address] Date: 05/22/2023 Prepared by: Clydie Braun Menke  Exercises - Supine Hamstring Stretch with Strap  - 1 x daily - 7 x weekly - 1 sets - 3 reps - 30 sec hold - Supine ITB Stretch with Strap  - 1 x daily - 7 x weekly - 1 sets - 3 reps - 30sec hold - Supine Piriformis Stretch Pulling Heel to Hip  - 1 x daily - 7 x weekly - 1 sets - 3 reps - 30 sec hold - Seated Figure 4 Piriformis Stretch  - 1 x daily - 7 x weekly - 1 sets - 3 reps - 30 sec hold - Supine Posterior Pelvic Tilt  - 1 x daily - 7 x weekly - 3 sets - 10 reps - Supine 90/90 Alternating Heel Touches with Posterior Pelvic Tilt  - 1 x daily - 7 x weekly - 3 sets - 10 reps - Supine Dead Bug with Leg Extension  - 1 x daily - 7 x weekly - 3 sets - 10 reps - Supine Lower Trunk Rotation  - 1 x daily - 7 x weekly - 3 sets - 10 reps - Supine Piriformis Stretch Pulling Heel to Hip  - 1 x daily - 7 x weekly - 3 sets - 3 reps - 30 hold - Supine Hip External Rotation  - 1 x daily - 7 x weekly - 1 sets - 60 hold - Bent Knee Fallouts  - 1 x daily - 7 x weekly - 2 sets - 10 reps  ASSESSMENT:  CLINICAL IMPRESSION: Patient arrived with high levels of Lt sided low back pain. Pt ambulated with antalgic pattern and noted she had to call out of work secondary to pain with being upright. Pt had no pain with hip flexibility testing on  the Lt but had ipsilateral low back pain with weight bearing. Pt had palpable tenderness in the low lumbar paraspinals and multifidi and requested dry needling. PT completed manual therapy to improve lumbar spine  mobility and glute activation in weight bearing. Ended with dry needling where several twitch responses were elicited in the Lt lower multifidi. Pt did appear to be taking longer strides end of session, but felt her pain remained the same. PT educated pt on ice parameters and encouraged her to perform HEP stretches up to 4 times a day as long as pain was not exacerbated during this. She verbalized understanding.   OBJECTIVE IMPAIRMENTS: decreased balance, difficulty walking, decreased strength, increased muscle spasms, impaired flexibility, postural dysfunction, and pain.   ACTIVITY LIMITATIONS: bending and transfers  PARTICIPATION LIMITATIONS: cleaning and community activity  PERSONAL FACTORS: Past/current experiences, Time since onset of injury/illness/exacerbation, and 1-2 comorbidities: Hx of breast cancer, Hx of low back pain  are also affecting patient's functional outcome.   REHAB POTENTIAL: Good  CLINICAL DECISION MAKING: Stable/uncomplicated  EVALUATION COMPLEXITY: Low   GOALS: Goals reviewed with patient? No  SHORT TERM GOALS: Target date: 06/08/2023  Pt will be independent with proper technique with initial HEP. Baseline: Goal status: INITIAL  2.  Assess single leg stance time on each LE to establish a baseline and potential fall risk. Baseline:  Goal status: INITIAL   LONG TERM GOALS: Target date: 07/13/2023  Pt will be independent with advanced HEP. Baseline:  Goal status: INITIAL  2.  Pt will increase increase strength to at least 4/5 throughout to improve her ability to navigate stairs with reciprocal pattern. Baseline:  Goal status: INITIAL  3.  Pt will increase FOTO to at least 71% to demonstrate improvements in functional mobility. Baseline:  Goal status: INITIAL  4.  Pt will be able to transfer from sit to stand and supine to sit without increased pain. Baseline:  Goal status: INITIAL   PLAN:  PT FREQUENCY: 1-2x/week  PT DURATION: 8  weeks  PLANNED INTERVENTIONS: Therapeutic exercises, Therapeutic activity, Neuromuscular re-education, Balance training, Gait training, Patient/Family education, Self Care, Joint mobilization, Joint manipulation, Stair training, Aquatic Therapy, Dry Needling, Electrical stimulation, Spinal manipulation, Spinal mobilization, Cryotherapy, Moist heat, Taping, Traction, Ultrasound, Ionotophoresis 4mg /ml Dexamethasone, Manual therapy, and Re-evaluation.  PLAN FOR NEXT SESSION: Assess and progress HEP as indicated, strengthening, postural/core stability, balance.  2:31 PM,05/28/23 Donita Brooks PT, DPT [facility]  [phone]   [facility] [address] [place], [place] [address] Phone # [phone] Fax [phone]

## 2023-05-30 ENCOUNTER — Ambulatory Visit (INDEPENDENT_AMBULATORY_CARE_PROVIDER_SITE_OTHER): Payer: PPO

## 2023-05-30 ENCOUNTER — Encounter: Payer: Self-pay | Admitting: Internal Medicine

## 2023-05-30 ENCOUNTER — Ambulatory Visit (INDEPENDENT_AMBULATORY_CARE_PROVIDER_SITE_OTHER): Payer: PPO | Admitting: Internal Medicine

## 2023-05-30 ENCOUNTER — Ambulatory Visit: Payer: PPO

## 2023-05-30 VITALS — BP 120/80 | HR 99 | Temp 97.9°F | Ht 63.5 in | Wt 136.0 lb

## 2023-05-30 DIAGNOSIS — M5459 Other low back pain: Secondary | ICD-10-CM

## 2023-05-30 DIAGNOSIS — R262 Difficulty in walking, not elsewhere classified: Secondary | ICD-10-CM

## 2023-05-30 DIAGNOSIS — R252 Cramp and spasm: Secondary | ICD-10-CM

## 2023-05-30 DIAGNOSIS — M545 Low back pain, unspecified: Secondary | ICD-10-CM

## 2023-05-30 DIAGNOSIS — M6281 Muscle weakness (generalized): Secondary | ICD-10-CM

## 2023-05-30 DIAGNOSIS — G8929 Other chronic pain: Secondary | ICD-10-CM

## 2023-05-30 DIAGNOSIS — M47816 Spondylosis without myelopathy or radiculopathy, lumbar region: Secondary | ICD-10-CM | POA: Diagnosis not present

## 2023-05-30 DIAGNOSIS — M4856XA Collapsed vertebra, not elsewhere classified, lumbar region, initial encounter for fracture: Secondary | ICD-10-CM | POA: Diagnosis not present

## 2023-05-30 DIAGNOSIS — R293 Abnormal posture: Secondary | ICD-10-CM

## 2023-05-30 DIAGNOSIS — M4855XA Collapsed vertebra, not elsewhere classified, thoracolumbar region, initial encounter for fracture: Secondary | ICD-10-CM | POA: Diagnosis not present

## 2023-05-30 MED ORDER — PREDNISONE 10 MG (21) PO TBPK: ORAL_TABLET | ORAL | 0 refills | Status: DC

## 2023-05-30 NOTE — Therapy (Signed)
OUTPATIENT PHYSICAL THERAPY TREATMENT   Patient Name: Margaret Mckenzie MRN: [id number] DOB:1951/03/02, 72 y.o., female Today's Date: 05/30/2023  END OF SESSION:  PT End of Session - 05/30/23 0759     Visit Number 3    Date for PT Re-Evaluation 07/13/23    Authorization Type [employer]    Progress Note Due on Visit 10    PT Start Time 0800    PT Stop Time 0845    PT Time Calculation (min) 45 min    Activity Tolerance Patient tolerated treatment well    Behavior During Therapy WFL for tasks assessed/performed             Past Medical History:  Diagnosis Date   Anxiety    Bipolar 1 disorder (HCC)    Chicken pox    Depression    Hypertension    high blood pressure readings    Kidney disease    kidney stones and saw urologist, Dr. Dillard Mckenzie for lithotripsy remotely    PAF (paroxysmal atrial fibrillation) [facility])    Past Surgical History:  Procedure Laterality Date   BREAST BIOPSY  10/31/2022   MM LT RADIOACTIVE SEED LOC MAMMO GUIDE 10/31/2022 [facility]MO[facility]AP[facility]   BREAST LUMPECTOMY WITH RADIOACTIVE SEED LOCALIZATION Left 11/02/2022   Procedure: LEFT BREAST LUMPECTOMY WITH RADIOACTIVE SEED LOCALIZATION;  Surgeon: Margaret Bouillon, MD;  Location: [facility];  Service: General;  Laterality: Left;   BUNIONECTOMY  2007   TONSILLECTOMY AND ADENOIDECTOMY  1975   Patient Active Problem List   Diagnosis Date Noted   NASH (nonalcoholic steatohepatitis) 12/11/2022   Transaminitis 11/09/2022   Malignant neoplasm of upper-outer quadrant of left breast in female, estrogen receptor positive (HCC) 10/09/2022   Chronic atrial fibrillation (HCC) 05/08/2022   Hyponatremia 07/15/2021   Generalized anxiety disorder 10/25/2020   Panic disorder 10/25/2020   Bipolar I disorder (HCC) 10/25/2020   Insomnia 10/25/2020   Vitamin B12 deficiency 04/23/2020   Paroxysmal atrial fibrillation (HCC) 08/31/2017   Essential hypertension 08/31/2017    PCP: Margaret Mckenzie,  Margaret Patricia, MD  REFERRING PROVIDER: Philip Mckenzie, Margaret Patricia, MD  REFERRING DIAG: M54.9 (ICD-10-CM) - Back pain, unspecified back location, unspecified back pain laterality, unspecified chronicity  Rationale for Evaluation and Treatment: Rehabilitation  THERAPY DIAG:  Other low back pain  Cramp and spasm  Difficulty in walking, not elsewhere classified  Muscle weakness (generalized)  Abnormal posture  ONSET DATE: a few weeks ago, unknown origin  SUBJECTIVE:  SUBJECTIVE STATEMENT: Pt reports she continues to be at around 10/10.  Has not been able to work. Only taking Ibuprofen.  Has appt with MD today.    PERTINENT HISTORY:  Breast Cancer in October 2023 with lumpectomy without lymph node removal.  Injections years ago.  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  Had back issues but the injections solved her pain and she did not require further intervention.   PAIN:  05/30/23: Are you having pain? Yes: NPRS scale: Generally 10/10 Pain location: left sided low back but denies any leg pain Pain description: dull and occasionally sharp Aggravating factors: going from sit to stand Relieving factors: standing up, dry needling in the past, walking  PRECAUTIONS: None  WEIGHT BEARING RESTRICTIONS: No  FALLS:  Has patient fallen in last 6 months? 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  NEXT MD  VISIT: As needed, but October for annual physical  OBJECTIVE:   DIAGNOSTIC FINDINGS:  Lumbar MRI on 03/07/2007 IMPRESSION:  1. The dominant finding is focal and intense bony edema in the right aspect of S1.  Edema is also noted in the right pedicle of L5 and about the right facet joint.  All of this bone edema may be related to facet arthropathy although a sacral insufficiency fracture of S1 is within the differential.  Appearances are not suggestive of a neoplastic process.   2.  Mild degenerative disk disease at L4-5 and L5-S1 without notable central canal or foraminal stenosis.   PATIENT SURVEYS:  FOTO 61% (projected 71% by visit 9)  SCREENING FOR RED FLAGS: Bowel or bladder incontinence: No Spinal tumors: No Cauda equina syndrome: No Compression fracture: No Abdominal aneurysm: No  COGNITION: Overall cognitive status: Within functional limits for tasks assessed     SENSATION: Within normal limits accept L2-3 patient identified Left as 2-3% different, took patient long to identify difference   MUSCLE LENGTH: Hamstrings: Right 55 deg; Left 50 deg   POSTURE: rounded shoulders, forward head, and flexed trunk   LUMBAR ROM:   WFL  LOWER EXTREMITY ROM:     WFL  LOWER EXTREMITY MMT:    Eval: Bilateral hip abduction strength is 3-/5 Left Hip flexion 3/5, Right hip flexion 5/5 Left knee flexion 3-/5, right knee flexion 3-/5 Bilateral knee extension 5/5 Bilateral Hip internal rotation 5/5 Bilateral Hip external rotation 5/5  LUMBAR SPECIAL TESTS:  Eval:  FABER test: Positive (tighter on right, but positive bilaterally)  FUNCTIONAL TESTS:  Eval:   5 times sit to stand: 8.45 sec Timed up and go (TUG): 10.35 sec  GAIT: Distance walked: greater than 1109feet Assistive device utilized: None Level of assistance: Complete Independence Comments: observed deviations in left hip externally rotated with excessive pronation due to left greater toe bunion, left hip drop    TODAY'S TREATMENT:  DATE:  05/30/23 Margaret Mckenzie arrives with continued 10/10 pain, we sit and discuss various attempts at pain control and how exercises are going at home.  Standing hamstring stretch 3 x 30 sec with heavy verbal cues for technique to control pain Standing quad/hip flexor stretch 3 x 30 sec with heavy verbal cues for technique to control pain (patient became light headed and needed to lie down) Placed patient in supine with ice to lumbar spine and feet elevated x 10 min During ice, PT educated patient on possible lumbar stenosis vs continued sacral insufficiency issues from MRI in 2008.  Suggested patient modify HEP and went over specific exercises to continue.  Added hip IR/ER combo stretch due to severe left > right hip ER.    05/28/23 Tenderness with palpation Lt L5/S1 paraspinals.  Ice pack applied to Lt lumbar region throughout supine portion of session Supine bridge (+) pain on Lt Lt low lumbar rotation opening mobilization x3 reps  Lt hip extension contract/relax/stretch 5 sec hold, 10 sec stretch Attempted Lt hip extension MWM but pt unable to tolerate setup without increase in pain.  STM Lt medial/proximal glutes, Lt lumbar paraspinals Trigger Point Dry-Needling  Treatment instructions: Expect mild to moderate muscle soreness. S/S of pneumothorax if dry needled over a lung field, and to seek immediate medical attention should they occur. Patient verbalized understanding of these instructions and education. Patient Consent Given: Yes Education handout provided: Yes Muscles treated: Lt L4, L5, S1 lumbar multifidi Electrical stimulation performed: No Parameters: N/A Treatment response/outcome: Utilized skilled palpation to identify bony landmarks and trigger points.  Able to illicit twitch response and muscle elongation.  Soft tissue mobilization following  to further promote tissue elongation.   05/22/2023 Reviewed HEP, see below Trigger Point Dry-Needling  Treatment instructions: Expect mild to moderate muscle soreness. S/S of pneumothorax if dry needled over a lung field, and to seek immediate medical attention should they occur. Patient verbalized understanding of these instructions and education. Patient Consent Given: Yes Education handout provided: Yes Muscles treated: bilateral lumbar multifidi, bilateral glutes/piriformis Electrical stimulation performed: No Parameters: N/A Treatment response/outcome: Utilized skilled palpation to identify bony landmarks and trigger points.  Able to illicit twitch response and muscle elongation.  Soft tissue mobilization following to further promote tissue elongation.     PATIENT EDUCATION:  Education details: Issued HEP Person educated: Patient Education method: Explanation, Facilities manager, and Handouts Education comprehension: verbalized understanding  HOME EXERCISE PROGRAM: Access Code: [email or web address] URL: [email or web address] Date: 05/30/2023 Prepared by: Mikey Kirschner  Exercises - Supine Hamstring Stretch with Strap  - 1 x daily - 7 x weekly - 1 sets - 3 reps - 30 sec hold - Supine ITB Stretch with Strap  - 1 x daily - 7 x weekly - 1 sets - 3 reps - 30sec hold - Supine Piriformis Stretch Pulling Heel to Hip  - 1 x daily - 7 x weekly - 1 sets - 3 reps - 30 sec hold - Seated Figure 4 Piriformis Stretch  - 1 x daily - 7 x weekly - 1 sets - 3 reps - 30 sec hold - Supine Posterior Pelvic Tilt  - 1 x daily - 7 x weekly - 3 sets - 10 reps - Supine 90/90 Alternating Heel Touches with Posterior Pelvic Tilt  - 1 x daily - 7 x weekly - 3 sets - 10 reps - Supine Dead Bug with Leg Extension  - 1 x daily - 7 x weekly - 3 sets - 10 reps - Supine  Lower Trunk Rotation  - 1 x daily - 7 x weekly - 3 sets - 10 reps - Supine Piriformis Stretch Pulling Heel to Hip  - 1 x daily - 7 x weekly - 3 sets  - 3 reps - 30 hold - Supine Hip External Rotation  - 1 x daily - 7 x weekly - 1 sets - 60 hold - Bent Knee Fallouts  - 1 x daily - 7 x weekly - 2 sets - 10 reps - Supine Hip Internal and External Rotation  - 1 x daily - 7 x weekly - 3 sets - 10 reps ASSESSMENT:  CLINICAL IMPRESSION: Margaret Mckenzie continues to be at a 10/10 pain level consistently.  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  We had completed a successful episode of PT at the end of 2023 where she was pain free but pain reoccurred recently and her pain is not subsiding this episode as it did previously.   She continues to work a job where she is on her feet for long periods of time.  She has not been able to work in the past two weeks as the pain has worsened.  We have attempted gentle stretching, dry needling and core activity along with pain control without success.  We had a lengthy discussion today about various other options that MD may suggest.  Encouraged her to keep her PT appts for now until she sees MD for follow up.  If pain level can be lowered, she would likely benefit from formal PT.  We will proceed based on MD suggestions at follow up today.  Will route this note to referring provider.    OBJECTIVE IMPAIRMENTS: decreased balance, difficulty walking, decreased strength, increased muscle spasms, impaired flexibility, postural dysfunction, and pain.   ACTIVITY LIMITATIONS: bending and transfers  PARTICIPATION LIMITATIONS: cleaning and community activity  PERSONAL FACTORS: Past/current experiences, Time since onset of injury/illness/exacerbation, and 1-2 comorbidities: Hx of breast cancer, Hx of low back pain  are also affecting patient's functional outcome.   REHAB POTENTIAL: Good  CLINICAL DECISION MAKING: Stable/uncomplicated  EVALUATION COMPLEXITY: Low   GOALS: Goals reviewed with patient? No  SHORT TERM GOALS: Target date:  06/08/2023  Pt will be independent with proper technique with initial HEP. Baseline: Goal status: MET  2.  Assess single leg stance time on each LE to establish a baseline and potential fall risk. Baseline:  Goal status: INITIAL   LONG TERM GOALS: Target date: 07/13/2023  Pt will be independent with advanced HEP. Baseline:  Goal status: INITIAL  2.  Pt will increase increase strength to at least 4/5 throughout to improve her ability to navigate stairs with reciprocal pattern. Baseline:  Goal status: INITIAL  3.  Pt will increase FOTO to at least 71% to demonstrate improvements in functional mobility. Baseline:  Goal status: INITIAL  4.  Pt will be able to transfer from sit to stand and supine to sit without increased pain. Baseline:  Goal status: INITIAL   PLAN:  PT FREQUENCY: 1-2x/week  PT DURATION: 8 weeks  PLANNED INTERVENTIONS: Therapeutic exercises, Therapeutic activity, Neuromuscular re-education, Balance training, Gait training, Patient/Family education, Self Care, Joint mobilization, Joint manipulation, Stair training, Aquatic Therapy, Dry Needling, Electrical stimulation, Spinal manipulation, Spinal mobilization, Cryotherapy, Moist heat, Taping, Traction, Ultrasound, Ionotophoresis 4mg /ml Dexamethasone, Manual therapy, and Re-evaluation.  PLAN FOR NEXT SESSION: Patient to see MD for follow up today.  Her  pain is not well controlled at this time.  We will proceed based on MD suggestion after follow up.    Victorino Dike B. Jlynn Langille, PT 05/30/23 9:03 AM  [facility] [address], [address] [place], [place] [address] Phone # [phone] Fax [phone]

## 2023-05-30 NOTE — Progress Notes (Signed)
Established Patient Office Visit     CC/Reason for Visit: Low back pain  HPI: Margaret Mckenzie is a 72 y.o. female who is coming in today for the above mentioned reasons.  She has been experiencing low back pain for approximately 2 months.  Pain is located in left lower back without radiation, no bowel or bladder incontinence, no saddle anesthesia.  She has done NSAIDs, ice and heat, has been to physical therapy and even had dry needling without much relief.  She has had to quit work this week because of her pain.   Past Medical/Surgical History: Past Medical History:  Diagnosis Date   Anxiety    Bipolar 1 disorder (HCC)    Chicken pox    Depression    Hypertension    high blood pressure readings    Kidney disease    kidney stones and saw urologist, Dr. Dillard Cannon for lithotripsy remotely    PAF (paroxysmal atrial fibrillation) [facility])     Past Surgical History:  Procedure Laterality Date   BREAST BIOPSY  10/31/2022   MM LT RADIOACTIVE SEED LOC MAMMO GUIDE 10/31/2022 [facility] MAMMOGRAP[facility]   BREAST LUMPECTOMY WITH RADIOACTIVE SEED LOCALIZATION Left 11/02/2022   Procedure: LEFT BREAST LUMPECTOMY WITH RADIOACTIVE SEED LOCALIZATION;  Surgeon: Harriette Bouillon, MD;  Location: [facility];  Service: General;  Laterality: Left;   BUNIONECTOMY  2007   TONSILLECTOMY AND ADENOIDECTOMY  1975    Social History:  reports that she quit smoking about 5 years ago. Her smoking use included cigarettes. She has a 20.00 pack-year smoking history. She has never used smokeless tobacco. She reports that she does not currently use alcohol. She reports that she does not use drugs.  Allergies: Allergies  Allergen Reactions   Nefazodone Other (See Comments)    Hypersensitivity   Trazodone Other (See Comments)    Trazodone causes lethargy the morning after taking it    Family History:  Family History  Problem Relation Age of Onset   Osteoporosis Mother    Stroke Mother         TIA, stoke   Mental retardation Mother    Hypertension Father    Heart disease Father 62   Stroke Father 22   Mental illness Sister    Breast cancer Neg Hx      Current Outpatient Medications:    acamprosate (CAMPRAL) 333 MG tablet, Take 2 tablets (666 mg total) by mouth 3 (three) times daily with meals., Disp: 180 tablet, Rfl: 1   anastrozole (ARIMIDEX) 1 MG tablet, TAKE 1 TABLET BY MOUTH EVERY DAY, Disp: 90 tablet, Rfl: 1   Ascorbic Acid (VITAMIN C) 1000 MG tablet, Take 1,000 mg by mouth at bedtime., Disp: , Rfl:    busPIRone (BUSPAR) 15 MG tablet, Take 1 tablet (15 mg total) by mouth 2 (two) times daily., Disp: 90 tablet, Rfl: 0   Calcium Carbonate (CALCIUM 500 PO), Take 1 tablet by mouth at bedtime., Disp: , Rfl:    Cholecalciferol (VITAMIN D-3) 25 MCG (1000 UT) CAPS, Take 4,000 Units by mouth at bedtime., Disp: , Rfl:    clonazePAM (KLONOPIN) 1 MG tablet, Take 1 tablet (1 mg total) by mouth 2 (two) times daily as needed for anxiety. And may take an extra 1/2 pill mid-day prn, Disp: 75 tablet, Rfl: 1   cyanocobalamin (VITAMIN B12) 1000 MCG/ML injection, INJECT 1 ML (1,000 MCG) INTRAMUSCULARLY EVERY 30 DAYS, Disp: 9 mL, Rfl: 1   DILT-XR 180 MG 24 hr capsule,  TAKE 1 CAPSULE BY MOUTH EVERY DAY, Disp: 90 capsule, Rfl: 3   diltiazem (CARDIZEM) 30 MG tablet, Take 1 tablet every 4 hours AS NEEDED for AFIB heart rate >100, Disp: 45 tablet, Rfl: 1   ELIQUIS 5 MG TABS tablet, TAKE 1 TABLET BY MOUTH TWICE A DAY, Disp: 180 tablet, Rfl: 3   lamoTRIgine (LAMICTAL) 150 MG tablet, Take 2 tablets (300 mg total) by mouth at bedtime., Disp: 180 tablet, Rfl: 1   lisinopril (ZESTRIL) 20 MG tablet, TAKE 1 TABLET BY MOUTH EVERY DAY, Disp: 90 tablet, Rfl: 1   Magnesium 400 MG CAPS, Take 400 mg by mouth at bedtime., Disp: , Rfl:    predniSONE (STERAPRED UNI-PAK 21 TAB) 10 MG (21) TBPK tablet, Take as directed, Disp: 21 tablet, Rfl: 0   risperiDONE (RISPERDAL) 3 MG tablet, Take 1 tablet (3 mg total) by mouth at  bedtime., Disp: 90 tablet, Rfl: 1   thiamine (VITAMIN B1) 100 MG tablet, Take 100 mg by mouth daily., Disp: , Rfl:    traZODone (DESYREL) 50 MG tablet, Take 1-2 tablets (50-100 mg total) by mouth at bedtime as needed. for sleep, Disp: 180 tablet, Rfl: 1  Review of Systems:  Negative unless indicated in HPI.   Physical Exam: Vitals:   05/30/23 1545  BP: 120/80  Pulse: 99  Temp: 97.9 F (36.6 C)  TempSrc: Oral  SpO2: 97%  Weight: 136 lb (61.7 kg)  Height: 5' 3.5" (1.613 m)    Body mass index is 23.71 kg/m.   Physical Exam Vitals reviewed.  Constitutional:      Appearance: Normal appearance.  HENT:     Head: Normocephalic and atraumatic.  Eyes:     Conjunctiva/sclera: Conjunctivae normal.     Pupils: Pupils are equal, round, and reactive to light.  Skin:    General: Skin is warm and dry.  Neurological:     General: No focal deficit present.     Mental Status: She is alert and oriented to person, place, and time.  Psychiatric:        Mood and Affect: Mood normal.        Behavior: Behavior normal.        Thought Content: Thought content normal.        Judgment: Judgment normal.      Impression and Plan:  Chronic left-sided low back pain without sciatica -     predniSONE; Take as directed  Dispense: 21 tablet; Refill: 0 -     DG Lumbar Spine Complete; Future  -Given duration and the fact that she has tried many conservative measures without relief, I think it is prudent to do lumbar spine x-rays today.  She will also be sent in a prednisone taper.  Further workup to follow.  She will continue physical therapy.   Time spent:30 minutes reviewing chart, interviewing and examining patient and formulating plan of care.     Chaya Jan, MD [facility]

## 2023-06-04 ENCOUNTER — Ambulatory Visit: Payer: PPO | Admitting: Physical Therapy

## 2023-06-04 DIAGNOSIS — M5459 Other low back pain: Secondary | ICD-10-CM

## 2023-06-04 DIAGNOSIS — M6281 Muscle weakness (generalized): Secondary | ICD-10-CM

## 2023-06-04 DIAGNOSIS — R262 Difficulty in walking, not elsewhere classified: Secondary | ICD-10-CM

## 2023-06-04 DIAGNOSIS — R252 Cramp and spasm: Secondary | ICD-10-CM

## 2023-06-04 DIAGNOSIS — R293 Abnormal posture: Secondary | ICD-10-CM

## 2023-06-04 NOTE — Therapy (Addendum)
OUTPATIENT PHYSICAL THERAPY TREATMENT AND LATE ENTRY DISCHARGE SUMMARY   Patient Name: Margaret Mckenzie MRN: [id number] DOB:Nov 22, 1951, 72 y.o., female Today's Date: 06/04/2023  END OF SESSION:  PT End of Session - 06/04/23 1445     Visit Number 4    Date for PT Re-Evaluation 07/13/23    Authorization Type [employer]    Progress Note Due on Visit 10    PT Start Time 1445    PT Stop Time 1530    PT Time Calculation (min) 45 min    Activity Tolerance Patient tolerated treatment well    Behavior During Therapy WFL for tasks assessed/performed              Past Medical History:  Diagnosis Date   Anxiety    Bipolar 1 disorder (HCC)    Chicken pox    Depression    Hypertension    high blood pressure readings    Kidney disease    kidney stones and saw urologist, Dr. Dillard Cannon for lithotripsy remotely    PAF (paroxysmal atrial fibrillation) [facility])    Past Surgical History:  Procedure Laterality Date   BREAST BIOPSY  10/31/2022   MM LT RADIOACTIVE SEED LOC MAMMO GUIDE 10/31/2022 [facility] MAMMOGRAP[facility]   BREAST LUMPECTOMY WITH RADIOACTIVE SEED LOCALIZATION Left 11/02/2022   Procedure: LEFT BREAST LUMPECTOMY WITH RADIOACTIVE SEED LOCALIZATION;  Surgeon: Harriette Bouillon, MD;  Location: [facility];  Service: General;  Laterality: Left;   BUNIONECTOMY  2007   TONSILLECTOMY AND ADENOIDECTOMY  1975   Patient Active Problem List   Diagnosis Date Noted   NASH (nonalcoholic steatohepatitis) 12/11/2022   Transaminitis 11/09/2022   Malignant neoplasm of upper-outer quadrant of left breast in female, estrogen receptor positive (HCC) 10/09/2022   Chronic atrial fibrillation (HCC) 05/08/2022   Hyponatremia 07/15/2021   Generalized anxiety disorder 10/25/2020   Panic disorder 10/25/2020   Bipolar I disorder (HCC) 10/25/2020   Insomnia 10/25/2020   Vitamin B12 deficiency 04/23/2020   Paroxysmal atrial fibrillation (HCC) 08/31/2017   Essential hypertension  08/31/2017    PCP: Philip Aspen, Limmie Patricia, MD  REFERRING PROVIDER: Philip Aspen, Limmie Patricia, MD  REFERRING DIAG: M54.9 (ICD-10-CM) - Back pain, unspecified back location, unspecified back pain laterality, unspecified chronicity  Rationale for Evaluation and Treatment: Rehabilitation  THERAPY DIAG:  Other low back pain  Cramp and spasm  Difficulty in walking, not elsewhere classified  Muscle weakness (generalized)  Abnormal posture  ONSET DATE: a few weeks ago, unknown origin  SUBJECTIVE:  SUBJECTIVE STATEMENT: Pt states she was started on prednisone last week. Feeling better this afternoon.   PERTINENT HISTORY:  Breast Cancer in October 2023 with lumpectomy without lymph node removal.  Injections years ago.  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  Had back issues but the injections solved her pain and she did not require further intervention.   PAIN:  05/30/23: Are you having pain? Yes: NPRS scale: 7/10 Pain location: left sided low back but denies any leg pain Pain description: dull and occasionally sharp Aggravating factors: going from sit to stand Relieving factors: standing up, dry needling in the past, walking  PRECAUTIONS: None  WEIGHT BEARING RESTRICTIONS: No  FALLS:  Has patient fallen in last 6 months? 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  NEXT MD VISIT: As needed,  but October for annual physical  OBJECTIVE:   DIAGNOSTIC FINDINGS:  Lumbar MRI on 03/07/2007 IMPRESSION:  1. The dominant finding is focal and intense bony edema in the right aspect of S1.  Edema is also noted in the right pedicle of L5 and about the right facet joint.  All of this bone edema may be related to facet arthropathy although a sacral insufficiency fracture of S1 is within the differential.  Appearances are not suggestive of a neoplastic process.   2.  Mild degenerative disk disease at L4-5 and L5-S1 without notable central canal or foraminal stenosis.   PATIENT SURVEYS:  FOTO 61% (projected 71% by visit 9)  SCREENING FOR RED FLAGS: Bowel or bladder incontinence: No Spinal tumors: No Cauda equina syndrome: No Compression fracture: No Abdominal aneurysm: No  COGNITION: Overall cognitive status: Within functional limits for tasks assessed     SENSATION: Within normal limits accept L2-3 patient identified Left as 2-3% different, took patient long to identify difference   MUSCLE LENGTH: Hamstrings: Right 55 deg; Left 50 deg   POSTURE: rounded shoulders, forward head, and flexed trunk   LUMBAR ROM:   WFL  LOWER EXTREMITY ROM:     WFL  LOWER EXTREMITY MMT:    Eval: Bilateral hip abduction strength is 3-/5 Left Hip flexion 3/5, Right hip flexion 5/5 Left knee flexion 3-/5, right knee flexion 3-/5 Bilateral knee extension 5/5 Bilateral Hip internal rotation 5/5 Bilateral Hip external rotation 5/5  LUMBAR SPECIAL TESTS:  Eval:  FABER test: Positive (tighter on right, but positive bilaterally)  FUNCTIONAL TESTS:  Eval:   5 times sit to stand: 8.45 sec Timed up and go (TUG): 10.35 sec  GAIT: Distance walked: greater than 165feet Assistive device utilized: None Level of assistance: Complete Independence Comments: observed deviations in left hip externally rotated with excessive pronation due to left greater toe bunion, left hip drop   TODAY'S TREATMENT:  DATE:  06/04/23 Seated pball roll out x 30 sec, with R lateral flexion x30 sec Seated figure 4 stretch 2x 30 sec Quadruped cat/cow x10 Quadruped hip ext x10 Quadruped "Wag the tail" x10 Quadruped thoracic rotation x10 Seated pball press down 2x10 Seated hip hinge x10 Sit<>stand with hip hinge 2x10 Attempted sitting on pball but pt highly challenged and could feel it in her back  PPT 2x10 PPT + ball squeeze 2x10 Butterfly stretch x 30 sec Sidelying clamshell 2x10  05/30/23 Amaiyah arrives with continued 10/10 pain, we sit and discuss various attempts at pain control and how exercises are going at home.  Standing hamstring stretch 3 x 30 sec with heavy verbal cues for technique to control pain Standing quad/hip flexor stretch 3 x 30 sec with heavy verbal cues for technique to control pain (patient became light headed and needed to lie down) Placed patient in supine with ice to lumbar spine and feet elevated x 10 min During ice, PT educated patient on possible lumbar stenosis vs continued sacral insufficiency issues from MRI in 2008.  Suggested patient modify HEP and went over specific exercises to continue.  Added hip IR/ER combo stretch due to severe left > right hip ER.    05/28/23 Tenderness with palpation Lt L5/S1 paraspinals.  Ice pack applied to Lt lumbar region throughout supine portion of session Supine bridge (+) pain on Lt Lt low lumbar rotation opening mobilization x3 reps  Lt hip extension contract/relax/stretch 5 sec hold, 10 sec stretch Attempted Lt hip extension MWM but pt unable to tolerate setup without increase in pain.  STM Lt medial/proximal glutes, Lt lumbar paraspinals Trigger Point Dry-Needling  Treatment instructions: Expect mild to moderate muscle soreness. S/S of pneumothorax if dry needled over a lung field, and to seek immediate medical  attention should they occur. Patient verbalized understanding of these instructions and education. Patient Consent Given: Yes Education handout provided: Yes Muscles treated: Lt L4, L5, S1 lumbar multifidi Electrical stimulation performed: No Parameters: N/A Treatment response/outcome: Utilized skilled palpation to identify bony landmarks and trigger points.  Able to illicit twitch response and muscle elongation.  Soft tissue mobilization following to further promote tissue elongation.   05/22/2023 Reviewed HEP, see below Trigger Point Dry-Needling  Treatment instructions: Expect mild to moderate muscle soreness. S/S of pneumothorax if dry needled over a lung field, and to seek immediate medical attention should they occur. Patient verbalized understanding of these instructions and education. Patient Consent Given: Yes Education handout provided: Yes Muscles treated: bilateral lumbar multifidi, bilateral glutes/piriformis Electrical stimulation performed: No Parameters: N/A Treatment response/outcome: Utilized skilled palpation to identify bony landmarks and trigger points.  Able to illicit twitch response and muscle elongation.  Soft tissue mobilization following to further promote tissue elongation.     PATIENT EDUCATION:  Education details: Issued HEP Person educated: Patient Education method: Explanation, Facilities manager, and Handouts Education comprehension: verbalized understanding  HOME EXERCISE PROGRAM: Access Code: [email or web address] URL: [email or web address] Date: 05/30/2023 Prepared by: Mikey Kirschner  Exercises - Supine Hamstring Stretch with Strap  - 1 x daily - 7 x weekly - 1 sets - 3 reps - 30 sec hold - Supine ITB Stretch with Strap  - 1 x daily - 7 x weekly - 1 sets - 3 reps - 30sec hold - Supine Piriformis Stretch Pulling Heel to Hip  - 1 x daily - 7 x weekly - 1 sets - 3 reps - 30 sec hold - Seated Figure 4 Piriformis Stretch  - 1 x daily -  7 x weekly - 1  sets - 3 reps - 30 sec hold - Supine Posterior Pelvic Tilt  - 1 x daily - 7 x weekly - 3 sets - 10 reps - Supine 90/90 Alternating Heel Touches with Posterior Pelvic Tilt  - 1 x daily - 7 x weekly - 3 sets - 10 reps - Supine Dead Bug with Leg Extension  - 1 x daily - 7 x weekly - 3 sets - 10 reps - Supine Lower Trunk Rotation  - 1 x daily - 7 x weekly - 3 sets - 10 reps - Supine Piriformis Stretch Pulling Heel to Hip  - 1 x daily - 7 x weekly - 3 sets - 3 reps - 30 hold - Supine Hip External Rotation  - 1 x daily - 7 x weekly - 1 sets - 60 hold - Bent Knee Fallouts  - 1 x daily - 7 x weekly - 2 sets - 10 reps - Supine Hip Internal and External Rotation  - 1 x daily - 7 x weekly - 3 sets - 10 reps ASSESSMENT:  CLINICAL IMPRESSION: Pain is better managed today. Able to tolerate initiation of strengthening exercises today. Worked on core/trunk stabilization. Pt was highly challenged with maintaining neutral lumbar alignment with quadruped exercises. Unable to tolerate sitting on pball without back pain.   OBJECTIVE IMPAIRMENTS: decreased balance, difficulty walking, decreased strength, increased muscle spasms, impaired flexibility, postural dysfunction, and pain.   ACTIVITY LIMITATIONS: bending and transfers  PARTICIPATION LIMITATIONS: cleaning and community activity  PERSONAL FACTORS: Past/current experiences, Time since onset of injury/illness/exacerbation, and 1-2 comorbidities: Hx of breast cancer, Hx of low back pain  are also affecting patient's functional outcome.   REHAB POTENTIAL: Good  CLINICAL DECISION MAKING: Stable/uncomplicated  EVALUATION COMPLEXITY: Low   GOALS: Goals reviewed with patient? No  SHORT TERM GOALS: Target date: 06/08/2023  Pt will be independent with proper technique with initial HEP. Baseline: Goal status: MET  2.  Assess single leg stance time on each LE to establish a baseline and potential fall risk. Baseline:  Goal status: INITIAL   LONG TERM  GOALS: Target date: 07/13/2023  Pt will be independent with advanced HEP. Baseline:  Goal status: INITIAL  2.  Pt will increase increase strength to at least 4/5 throughout to improve her ability to navigate stairs with reciprocal pattern. Baseline:  Goal status: INITIAL  3.  Pt will increase FOTO to at least 71% to demonstrate improvements in functional mobility. Baseline:  Goal status: INITIAL  4.  Pt will be able to transfer from sit to stand and supine to sit without increased pain. Baseline:  Goal status: INITIAL   PLAN:  PT FREQUENCY: 1-2x/week  PT DURATION: 8 weeks  PLANNED INTERVENTIONS: Therapeutic exercises, Therapeutic activity, Neuromuscular re-education, Balance training, Gait training, Patient/Family education, Self Care, Joint mobilization, Joint manipulation, Stair training, Aquatic Therapy, Dry Needling, Electrical stimulation, Spinal manipulation, Spinal mobilization, Cryotherapy, Moist heat, Taping, Traction, Ultrasound, Ionotophoresis 4mg /ml Dexamethasone, Manual therapy, and Re-evaluation.  PLAN FOR NEXT SESSION: Progress core/pelvic/hip strengthening. Stretching/manual work as indicated for pain.   [facility] April Randalyn Rhea Marina, PT 06/04/23 2:45 PM  [facility] [address] [place], [place] [address] Phone # [phone] Fax [phone]    PHYSICAL THERAPY DISCHARGE SUMMARY  Patient called into clinic on 06/11/2023 stating that she is cancelling all PT appointments, as she is going to see a Neurosurgeon.  Pt states that if PT is indicated again, she will attain  a new referral.  Patient agrees to discharge. Patient goals were not met. Patient is being discharged due to the patient's request.  Reather Laurence, PT, DPT 06/11/23, 10:40 AM

## 2023-06-06 ENCOUNTER — Ambulatory Visit (INDEPENDENT_AMBULATORY_CARE_PROVIDER_SITE_OTHER): Payer: PPO | Admitting: Family Medicine

## 2023-06-06 ENCOUNTER — Encounter: Payer: Self-pay | Admitting: Family Medicine

## 2023-06-06 VITALS — BP 108/78 | HR 92 | Temp 98.3°F | Wt 139.4 lb

## 2023-06-06 DIAGNOSIS — M545 Low back pain, unspecified: Secondary | ICD-10-CM

## 2023-06-06 MED ORDER — TRAMADOL HCL 50 MG PO TABS: 100.0000 mg | ORAL_TABLET | Freq: Four times a day (QID) | ORAL | 0 refills | Status: DC | PRN

## 2023-06-06 MED ORDER — PREDNISONE 10 MG PO TABS
10.0000 mg | ORAL_TABLET | Freq: Two times a day (BID) | ORAL | 0 refills | Status: DC
Start: 2023-06-06 — End: 2023-06-14

## 2023-06-06 NOTE — Progress Notes (Signed)
   Subjective:    Patient ID: Margaret Mckenzie, female    DOB: 29-May-1951, 72 y.o.   MRN: [id number]  HPI Here to follow up on 2 months of pain in the left lower back. No radiation to the legs. No hx of trauma. She had a lumbar spine MRI in 2008 showing scoliosis and edema around S1. It was not clear if this was the result of facet arthropathy or an occult fracture. No herniated discs were seen. When this recent pain flared up, she had plain films taken of her lumbar spine, but these have not been read yet. Dr. Ardyth Harps, her PCP, ordered a steroid taper pack and sent her to PT. She is also taking Tylenol. At first she felt better, but as the steroids wore off the pain got worse again. She says the PT also makes the pain worse.    Review of Systems  Constitutional: Negative.   Respiratory: Negative.    Cardiovascular: Negative.   Musculoskeletal:  Positive for back pain.  Neurological:  Negative for weakness and numbness.       Objective:   Physical Exam Constitutional:      Comments: In pain, walks with a slight limp   Cardiovascular:     Rate and Rhythm: Normal rate and regular rhythm.     Pulses: Normal pulses.     Heart sounds: Normal heart sounds.  Pulmonary:     Effort: Pulmonary effort is normal.     Breath sounds: Normal breath sounds.  Musculoskeletal:     Comments: She is tender on the left side of the lower back, especially at the L5-S1 level. The spine has reduced ROM due to pain  Neurological:     Mental Status: She is alert.           Assessment & Plan:  Low back pain. We will put the PT on hold for now. We will start her on Prednisone 10 mg BID for 2 weeks. She can use Tramadol for the pain. We will have Radiology give [place] an expedited report on the plain films, and we will set up a lumbar spine MRI asap.  Gershon Crane, MD

## 2023-06-07 ENCOUNTER — Telehealth: Payer: Self-pay | Admitting: Internal Medicine

## 2023-06-07 ENCOUNTER — Ambulatory Visit
Admission: RE | Admit: 2023-06-07 | Discharge: 2023-06-07 | Disposition: A | Discharge status: Home / Self Care | Payer: PPO | Source: Ambulatory Visit | Attending: Family Medicine | Admitting: Family Medicine

## 2023-06-07 DIAGNOSIS — M545 Low back pain, unspecified: Secondary | ICD-10-CM | POA: Diagnosis not present

## 2023-06-07 DIAGNOSIS — R937 Abnormal findings on diagnostic imaging of other parts of musculoskeletal system: Secondary | ICD-10-CM | POA: Diagnosis not present

## 2023-06-07 NOTE — Telephone Encounter (Signed)
Prescription Request  06/07/2023  LOV: 06/06/2023 = Back Pain / Saw Dr. Clent Ridges  What is the name of the medication or equipment? traMADol traMADol (ULTRAM) 50 MG tablet  Pt states she is in a lot of pain and has no medication. Pt says pharmacy does not have it in stock at the moment, but told her to request alternative from MD.   Have you contacted your pharmacy to request a refill? Yes   Which pharmacy would you like this sent to?  [facility] [phone] G[place] Otto, [place] - [address] [place] [address] Phone: [phone] Fax: [phone]    Patient notified that their request is being sent to the clinical staff for review and that they should receive a response within 2 business days.   Please advise at Mobile [phone] (mobile)

## 2023-06-08 ENCOUNTER — Ambulatory Visit
Admission: RE | Admit: 2023-06-08 | Discharge: 2023-06-08 | Disposition: A | Discharge status: Home / Self Care | Payer: PPO | Source: Ambulatory Visit | Attending: Internal Medicine | Admitting: Internal Medicine

## 2023-06-08 ENCOUNTER — Telehealth: Payer: Self-pay | Admitting: Internal Medicine

## 2023-06-08 DIAGNOSIS — Z13820 Encounter for screening for osteoporosis: Secondary | ICD-10-CM

## 2023-06-08 DIAGNOSIS — M5136 Other intervertebral disc degeneration, lumbar region: Secondary | ICD-10-CM

## 2023-06-08 MED ORDER — HYDROCODONE-ACETAMINOPHEN 5-325 MG PO TABS: 1.0000 | ORAL_TABLET | Freq: Four times a day (QID) | ORAL | 0 refills | Status: DC | PRN

## 2023-06-08 NOTE — Telephone Encounter (Signed)
I sent in Norco 5-325 for her

## 2023-06-08 NOTE — Telephone Encounter (Signed)
The letter is ready  

## 2023-06-08 NOTE — Telephone Encounter (Signed)
Pt was seen by Dr. Clent Ridges on 06/06/23.  Pt called to say she needs a letter to return to work, with restrictions.  Letter is for her to return to work on Monday, June 11, 2023.  Letter should specify:  No pushing, no pulling and no heavy lifting  Pt would like to come by today to pick this letter up.

## 2023-06-11 ENCOUNTER — Ambulatory Visit (INDEPENDENT_AMBULATORY_CARE_PROVIDER_SITE_OTHER): Payer: PPO | Admitting: Physician Assistant

## 2023-06-11 ENCOUNTER — Encounter: Payer: PPO | Admitting: Physical Therapy

## 2023-06-11 ENCOUNTER — Encounter: Payer: Self-pay | Admitting: Physician Assistant

## 2023-06-11 DIAGNOSIS — F319 Bipolar disorder, unspecified: Secondary | ICD-10-CM | POA: Diagnosis not present

## 2023-06-11 DIAGNOSIS — F411 Generalized anxiety disorder: Secondary | ICD-10-CM | POA: Diagnosis not present

## 2023-06-11 DIAGNOSIS — G4700 Insomnia, unspecified: Secondary | ICD-10-CM

## 2023-06-11 DIAGNOSIS — Z87898 Personal history of other specified conditions: Secondary | ICD-10-CM

## 2023-06-11 MED ORDER — BUSPIRONE HCL 15 MG PO TABS: 15.0000 mg | ORAL_TABLET | Freq: Two times a day (BID) | ORAL | 1 refills | Status: DC

## 2023-06-11 NOTE — Progress Notes (Signed)
[facility] Med Check  Patient ID: Margaret Mckenzie,  MRN: [id number]  PCP: Philip Aspen, Limmie Patricia, MD  Date of Evaluation: 06/11/2023 Time spent:20 minutes  Chief Complaint:  Chief Complaint   Anxiety; Depression; Follow-up    HISTORY/CURRENT STATUS: HPI  For routine med check.  She's doing well. Anxiety is well controlled on the Klonopin. Has been taking it twice a day. Gets overwhelmed more often than having panic attacks, however that has not frequent either right now.  She is dealing with back pain.  Was out of work for 2 weeks, is currently on steroids and an opiate for the pain.  Went back to work today.  She is a Child psychotherapist.  Had an MRI last week.  Has been referred to a neurosurgeon but the appointment has not been set yet.  Her mental health is stable, even on steroids she is not having any psychotic behaviors.  And she is sleeping well too.  Has not had any alcohol in months.  Acamprosate is working well.  Patient is able to enjoy things.  Energy and motivation are good.  No extreme sadness, tearfulness, or feelings of hopelessness.  ADLs and personal hygiene are normal.   Denies any changes in concentration, making decisions, or remembering things.  Appetite has not changed.  Weight is stable.  Denies suicidal or homicidal thoughts.  Patient denies increased energy with decreased need for sleep, increased talkativeness, racing thoughts, impulsivity or risky behaviors, increased spending, increased libido, grandiosity, increased irritability or anger, paranoia, or hallucinations.  Denies dizziness, syncope, seizures, numbness, tingling, tremor, tics, unsteady gait, slurred speech, confusion. Denies dystonia.  Individual Medical History/ Review of Systems: Changes? :Yes  See HPI  Past medications for mental health diagnoses include: Depakote, Lamictal, Prozac, Lexapro, Xanax, Sonata caused amnesia, Risperdal, Trazodone ineffective at low doses but caused dizziness at  higher doses.  Librium taper for detox, toward the end of the treatment she had issues with balance.  Acamprosate, questionably caused imbalance.  Allergies: Nefazodone and Trazodone  Current Medications:  Current Outpatient Medications:    acamprosate (CAMPRAL) 333 MG tablet, Take 2 tablets (666 mg total) by mouth 3 (three) times daily with meals., Disp: 180 tablet, Rfl: 1   anastrozole (ARIMIDEX) 1 MG tablet, TAKE 1 TABLET BY MOUTH EVERY DAY, Disp: 90 tablet, Rfl: 1   Ascorbic Acid (VITAMIN C) 1000 MG tablet, Take 1,000 mg by mouth at bedtime., Disp: , Rfl:    Calcium Carbonate (CALCIUM 500 PO), Take 1 tablet by mouth at bedtime., Disp: , Rfl:    Cholecalciferol (VITAMIN D-3) 25 MCG (1000 UT) CAPS, Take 4,000 Units by mouth at bedtime., Disp: , Rfl:    clonazePAM (KLONOPIN) 1 MG tablet, Take 1 tablet (1 mg total) by mouth 2 (two) times daily as needed for anxiety. And may take an extra 1/2 pill mid-day prn, Disp: 75 tablet, Rfl: 1   cyanocobalamin (VITAMIN B12) 1000 MCG/ML injection, INJECT 1 ML (1,000 MCG) INTRAMUSCULARLY EVERY 30 DAYS, Disp: 9 mL, Rfl: 1   DILT-XR 180 MG 24 hr capsule, TAKE 1 CAPSULE BY MOUTH EVERY DAY, Disp: 90 capsule, Rfl: 3   diltiazem (CARDIZEM) 30 MG tablet, Take 1 tablet every 4 hours AS NEEDED for AFIB heart rate >100, Disp: 45 tablet, Rfl: 1   ELIQUIS 5 MG TABS tablet, TAKE 1 TABLET BY MOUTH TWICE A DAY, Disp: 180 tablet, Rfl: 3   HYDROcodone-acetaminophen (NORCO) 5-325 MG tablet, Take 1 tablet by mouth every 6 (six) hours as needed for  moderate pain., Disp: 30 tablet, Rfl: 0   lamoTRIgine (LAMICTAL) 150 MG tablet, Take 2 tablets (300 mg total) by mouth at bedtime., Disp: 180 tablet, Rfl: 1   lisinopril (ZESTRIL) 20 MG tablet, TAKE 1 TABLET BY MOUTH EVERY DAY, Disp: 90 tablet, Rfl: 1   Magnesium 400 MG CAPS, Take 400 mg by mouth at bedtime., Disp: , Rfl:    predniSONE (DELTASONE) 10 MG tablet, Take 1 tablet (10 mg total) by mouth 2 (two) times daily with a meal.,  Disp: 30 tablet, Rfl: 0   risperiDONE (RISPERDAL) 3 MG tablet, Take 1 tablet (3 mg total) by mouth at bedtime., Disp: 90 tablet, Rfl: 1   thiamine (VITAMIN B1) 100 MG tablet, Take 100 mg by mouth daily., Disp: , Rfl:    traMADol (ULTRAM) 50 MG tablet, Take 2 tablets (100 mg total) by mouth every 6 (six) hours as needed for moderate pain., Disp: 60 tablet, Rfl: 0   traZODone (DESYREL) 50 MG tablet, Take 1-2 tablets (50-100 mg total) by mouth at bedtime as needed. for sleep, Disp: 180 tablet, Rfl: 1   busPIRone (BUSPAR) 15 MG tablet, Take 1 tablet (15 mg total) by mouth 2 (two) times daily., Disp: 180 tablet, Rfl: 1 Medication Side Effects: none  Family Medical/ Social History: Changes?  No  MENTAL HEALTH EXAM:  There were no vitals taken for this visit.There is no height or weight on file to calculate BMI.  General Appearance: Casual, Neat, and Well Groomed  Eye Contact:  Good  Speech:  Clear and Coherent and Normal Rate  Volume:  Normal  Mood:  Euthymic  Affect:  Congruent  Thought Process:  Goal Directed and Descriptions of Associations: Circumstantial  Orientation:  Full (Time, Place, and Person)  Thought Content: Logical   Suicidal Thoughts:  No  Homicidal Thoughts:  No  Memory:  WNL  Judgement:  Good  Insight:  Good  Psychomotor Activity:  Normal  Concentration:  Concentration: Good and Attention Span: Good  Recall:  Good  Fund of Knowledge: Good  Language: Good  Assets:  Desire for Improvement Financial Resources/Insurance Housing Transportation Vocational/Educational  ADL's:  Intact  Cognition: WNL  Prognosis:  Good   DIAGNOSES:    ICD-10-CM   1. Generalized anxiety disorder  F41.1     2. Bipolar I disorder (HCC)  F31.9     3. Insomnia, unspecified type  G47.00     4. History of alcohol use disorder  Z87.898      Receiving Psychotherapy: Yes   Matt Schooler  RECOMMENDATIONS:  PDMP was reviewed.  Last Klonopin filled 04/27/2023.   I provided 20 minutes of  face to face time during this encounter, including time spent before and after the visit in records review, medical decision making, counseling pertinent to today's visit, and charting.   I am glad to see her doing better as far as her mental health goes.  No changes in medications are needed.  She knows to call however if she starts having trouble sleeping or manic symptoms, likely caused by steroids.  Hopefully she will not need surgery for her back.  Continue acamprosate 333 mg, 2 p.o. 3 times daily. Continue BuSpar 15 mg, 1 p.o. twice daily. Continue Klonopin 1 mg, 1 p.o. twice daily as needed and may take 1/2 pill midday as needed. Continue Lamictal 150 mg, 2 p.o. nightly. Continue Risperdal 3 mg, 1 p.o. nightly. Continue trazodone 50 mg, 1/2-2 p.o. nightly as needed sleep. Continue therapy with Georgiann Mohs.  Continue AA or [facility]. Return in 3 months.  Melony Overly, PA-C

## 2023-06-13 NOTE — Telephone Encounter (Signed)
Spoke with pt confirmed she picked up her letter from the office

## 2023-06-14 ENCOUNTER — Encounter: Payer: Self-pay | Admitting: Internal Medicine

## 2023-06-14 ENCOUNTER — Ambulatory Visit: Payer: PPO | Admitting: Internal Medicine

## 2023-06-14 VITALS — BP 112/80 | HR 97 | Temp 99.0°F | Wt 135.0 lb

## 2023-06-14 DIAGNOSIS — M545 Low back pain, unspecified: Secondary | ICD-10-CM

## 2023-06-14 MED ORDER — PREDNISONE 10 MG PO TABS: 10.0000 mg | ORAL_TABLET | Freq: Two times a day (BID) | ORAL | 0 refills | Status: DC

## 2023-06-14 NOTE — Progress Notes (Signed)
Established Patient Office Visit     CC/Reason for Visit: Continued back pain  HPI: Margaret Mckenzie is a 72 y.o. female who is coming in today for the above mentioned reasons.  She has been experiencing back pain for about 5 weeks.  Prednisone taper helped but wore off quickly.  She has been taking 10 mg twice daily and this seems to be effective.  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  She had a lumbar MRI and I will insert results below:  IMPRESSION: 1. There is patchy abnormal marrow edema affecting the anterior vertebrae from the visible T11 level through the L3 superior endplate. And a chronically degenerated left facet at L1-L2 also appears mildly edematous. However, rather than tumor or infection these changes are favored to be degenerative in nature and likely secondary to salient finding of: Left subarticular disc extrusion at L1-L2 superimposed on dextroconvex lumbar scoliosis. Up to severe subsequent left foraminal and lateral recess stenosis there. Query Left L1 and/or L2 radiculitis.   2. No significant lumbar spinal stenosis. Multilevel additional lumbar spine degeneration including up to moderate bilateral L4 neural foraminal stenosis.   Past Medical/Surgical History: Past Medical History:  Diagnosis Date   Anxiety    Bipolar 1 disorder (HCC)    Chicken pox    Depression    Hypertension    high blood pressure readings    Kidney disease    kidney stones and saw urologist, Dr. Dillard Cannon for lithotripsy remotely    PAF (paroxysmal atrial fibrillation) [facility])     Past Surgical History:  Procedure Laterality Date   BREAST BIOPSY  10/31/2022   MM LT RADIOACTIVE SEED LOC MAMMO GUIDE 10/31/2022 [facility] M[facility]MOGRAP[facility]   BREAST LUMPECTOMY WITH RADIOACTIVE SEED LOCALIZATION Left 11/02/2022   Procedure: LEFT BREAST LUMPECTOMY WITH RADIOACTIVE SEED LOCALIZATION;  Surgeon: Harriette Bouillon, MD;  Location: [facility];   Service: General;  Laterality: Left;   BUNIONECTOMY  2007   TONSILLECTOMY AND ADENOIDECTOMY  1975    Social History:  reports that she quit smoking about 5 years ago. Her smoking use included cigarettes. She started smoking about 45 years ago. She has a 20 pack-year smoking history. She has never used smokeless tobacco. She reports that she does not currently use alcohol. She reports that she does not use drugs.  Allergies: Allergies  Allergen Reactions   Nefazodone Other (See Comments)    Hypersensitivity   Trazodone Other (See Comments)    Trazodone causes lethargy the morning after taking it    Family History:  Family History  Problem Relation Age of Onset   Osteoporosis Mother    Stroke Mother        TIA, stoke   Mental retardation Mother    Hypertension Father    Heart disease Father 44   Stroke Father 37   Mental illness Sister    Breast cancer Neg Hx      Current Outpatient Medications:    acamprosate (CAMPRAL) 333 MG tablet, Take 2 tablets (666 mg total) by mouth 3 (three) times daily with meals., Disp: 180 tablet, Rfl: 1   anastrozole (ARIMIDEX) 1 MG tablet, TAKE 1 TABLET BY MOUTH EVERY DAY, Disp: 90 tablet, Rfl: 1   Ascorbic Acid (VITAMIN C) 1000 MG tablet, Take 1,000 mg by mouth at bedtime., Disp: , Rfl:    busPIRone (BUSPAR) 15 MG tablet, Take 1 tablet (15 mg total) by mouth 2 (two) times  daily., Disp: 180 tablet, Rfl: 1   Calcium Carbonate (CALCIUM 500 PO), Take 1 tablet by mouth at bedtime., Disp: , Rfl:    Cholecalciferol (VITAMIN D-3) 25 MCG (1000 UT) CAPS, Take 4,000 Units by mouth at bedtime., Disp: , Rfl:    clonazePAM (KLONOPIN) 1 MG tablet, Take 1 tablet (1 mg total) by mouth 2 (two) times daily as needed for anxiety. And may take an extra 1/2 pill mid-day prn, Disp: 75 tablet, Rfl: 1   cyanocobalamin (VITAMIN B12) 1000 MCG/ML injection, INJECT 1 ML (1,000 MCG) INTRAMUSCULARLY EVERY 30 DAYS, Disp: 9 mL, Rfl: 1   DILT-XR 180 MG 24 hr capsule, TAKE 1  CAPSULE BY MOUTH EVERY DAY, Disp: 90 capsule, Rfl: 3   diltiazem (CARDIZEM) 30 MG tablet, Take 1 tablet every 4 hours AS NEEDED for AFIB heart rate >100, Disp: 45 tablet, Rfl: 1   ELIQUIS 5 MG TABS tablet, TAKE 1 TABLET BY MOUTH TWICE A DAY, Disp: 180 tablet, Rfl: 3   HYDROcodone-acetaminophen (NORCO) 5-325 MG tablet, Take 1 tablet by mouth every 6 (six) hours as needed for moderate pain., Disp: 30 tablet, Rfl: 0   lamoTRIgine (LAMICTAL) 150 MG tablet, Take 2 tablets (300 mg total) by mouth at bedtime., Disp: 180 tablet, Rfl: 1   lisinopril (ZESTRIL) 20 MG tablet, TAKE 1 TABLET BY MOUTH EVERY DAY, Disp: 90 tablet, Rfl: 1   Magnesium 400 MG CAPS, Take 400 mg by mouth at bedtime., Disp: , Rfl:    risperiDONE (RISPERDAL) 3 MG tablet, Take 1 tablet (3 mg total) by mouth at bedtime., Disp: 90 tablet, Rfl: 1   thiamine (VITAMIN B1) 100 MG tablet, Take 100 mg by mouth daily., Disp: , Rfl:    traMADol (ULTRAM) 50 MG tablet, Take 2 tablets (100 mg total) by mouth every 6 (six) hours as needed for moderate pain., Disp: 60 tablet, Rfl: 0   traZODone (DESYREL) 50 MG tablet, Take 1-2 tablets (50-100 mg total) by mouth at bedtime as needed. for sleep, Disp: 180 tablet, Rfl: 1   predniSONE (DELTASONE) 10 MG tablet, Take 1 tablet (10 mg total) by mouth 2 (two) times daily with a meal., Disp: 30 tablet, Rfl: 0  Review of Systems:  Negative unless indicated in HPI.   Physical Exam: Vitals:   06/14/23 1440  BP: 112/80  Pulse: 97  Temp: 99 F (37.2 C)  TempSrc: Oral  SpO2: 99%  Weight: 135 lb (61.2 kg)    Body mass index is 23.54 kg/m.    Impression and Plan:  Acute bilateral low back pain without sciatica -     predniSONE; Take 1 tablet (10 mg total) by mouth 2 (two) times daily with a meal.  Dispense: 30 tablet; Refill: 0 -     Ambulatory referral to Neurosurgery   -With above MRI results, will place an urgent referral to neurosurgery.  Refill prednisone.  Time spent:31 minutes reviewing  chart, interviewing and examining patient and formulating plan of care.     Chaya Jan, MD [facility] at [facility]

## 2023-06-15 ENCOUNTER — Other Ambulatory Visit (HOSPITAL_COMMUNITY): Payer: Self-pay

## 2023-06-15 ENCOUNTER — Encounter: Payer: Self-pay | Admitting: Internal Medicine

## 2023-06-18 ENCOUNTER — Encounter: Payer: PPO | Admitting: Rehabilitative and Restorative Service Providers"

## 2023-06-19 DIAGNOSIS — M5126 Other intervertebral disc displacement, lumbar region: Secondary | ICD-10-CM | POA: Diagnosis not present

## 2023-06-23 ENCOUNTER — Other Ambulatory Visit: Payer: Self-pay | Admitting: Physician Assistant

## 2023-06-25 ENCOUNTER — Encounter: Payer: PPO | Admitting: Physical Therapy

## 2023-06-27 ENCOUNTER — Encounter: Payer: PPO | Admitting: Rehabilitative and Restorative Service Providers"

## 2023-06-28 ENCOUNTER — Ambulatory Visit (INDEPENDENT_AMBULATORY_CARE_PROVIDER_SITE_OTHER): Payer: PPO | Admitting: Internal Medicine

## 2023-06-28 VITALS — BP 130/70 | HR 120 | Temp 100.5°F | Wt 138.5 lb

## 2023-06-28 DIAGNOSIS — M791 Myalgia, unspecified site: Secondary | ICD-10-CM | POA: Diagnosis not present

## 2023-06-28 DIAGNOSIS — M549 Dorsalgia, unspecified: Secondary | ICD-10-CM | POA: Diagnosis not present

## 2023-06-28 DIAGNOSIS — R509 Fever, unspecified: Secondary | ICD-10-CM

## 2023-06-28 LAB — POC COVID19 BINAXNOW: SARS Coronavirus 2 Ag: NEGATIVE

## 2023-06-28 NOTE — Progress Notes (Signed)
Established Patient Office Visit     CC/Reason for Visit: Weaning off prednisone  HPI: Margaret Mckenzie is a 72 y.o. female who is coming in today for the above mentioned reasons.  She has been dealing with lower back pain for some time.  She recently saw the surgeon and was pain-free, surgery was not recommended but she was told she could have an epidural injection if pain recurred.  She is only taking 5 mg of prednisone and is wondering about discontinuing altogether.  She was incidentally noticed to have a temperature of 100.5 today.  She has no URI symptoms.  She has been feeling achy and with crampy abdominal pain, had a small loose bowel movement this morning.   Past Medical/Surgical History: Past Medical History:  Diagnosis Date   Anxiety    Bipolar 1 disorder (HCC)    Chicken pox    Depression    Hypertension    high blood pressure readings    Kidney disease    kidney stones and saw urologist, Dr. Dillard Cannon for lithotripsy remotely    PAF (paroxysmal atrial fibrillation) [facility])     Past Surgical History:  Procedure Laterality Date   BREAST BIOPSY  10/31/2022   MM LT RADIOACTIVE SEED LOC MAMMO GUIDE 10/31/2022 [facility]AMMO[facility]AP[facility]   BREAST LUMPECTOMY WITH RADIOACTIVE SEED LOCALIZATION Left 11/02/2022   Procedure: LEFT BREAST LUMPECTOMY WITH RADIOACTIVE SEED LOCALIZATION;  Surgeon: Harriette Bouillon, MD;  Location: [facility];  Service: General;  Laterality: Left;   BUNIONECTOMY  2007   TONSILLECTOMY AND ADENOIDECTOMY  1975    Social History:  reports that she quit smoking about 5 years ago. Her smoking use included cigarettes. She started smoking about 45 years ago. She has a 20 pack-year smoking history. She has never used smokeless tobacco. She reports that she does not currently use alcohol. She reports that she does not use drugs.  Allergies: Allergies  Allergen Reactions   Nefazodone Other (See Comments)    Hypersensitivity   Trazodone Other  (See Comments)    Trazodone causes lethargy the morning after taking it    Family History:  Family History  Problem Relation Age of Onset   Osteoporosis Mother    Stroke Mother        TIA, stoke   Mental retardation Mother    Hypertension Father    Heart disease Father 92   Stroke Father 55   Mental illness Sister    Breast cancer Neg Hx      Current Outpatient Medications:    acamprosate (CAMPRAL) 333 MG tablet, Take 2 tablets (666 mg total) by mouth 3 (three) times daily with meals., Disp: 180 tablet, Rfl: 1   anastrozole (ARIMIDEX) 1 MG tablet, TAKE 1 TABLET BY MOUTH EVERY DAY, Disp: 90 tablet, Rfl: 1   Ascorbic Acid (VITAMIN C) 1000 MG tablet, Take 1,000 mg by mouth at bedtime., Disp: , Rfl:    busPIRone (BUSPAR) 15 MG tablet, Take 1 tablet (15 mg total) by mouth 2 (two) times daily., Disp: 180 tablet, Rfl: 1   Calcium Carbonate (CALCIUM 500 PO), Take 1 tablet by mouth at bedtime., Disp: , Rfl:    Cholecalciferol (VITAMIN D-3) 25 MCG (1000 UT) CAPS, Take 4,000 Units by mouth at bedtime., Disp: , Rfl:    clonazePAM (KLONOPIN) 1 MG tablet, Take 1 tablet (1 mg total) by mouth 2 (two) times daily as needed for anxiety. And may take an extra 1/2 pill mid-day prn, Disp: 75  tablet, Rfl: 1   cyanocobalamin (VITAMIN B12) 1000 MCG/ML injection, INJECT 1 ML (1,000 MCG) INTRAMUSCULARLY EVERY 30 DAYS, Disp: 9 mL, Rfl: 1   DILT-XR 180 MG 24 hr capsule, TAKE 1 CAPSULE BY MOUTH EVERY DAY, Disp: 90 capsule, Rfl: 3   diltiazem (CARDIZEM) 30 MG tablet, Take 1 tablet every 4 hours AS NEEDED for AFIB heart rate >100, Disp: 45 tablet, Rfl: 1   ELIQUIS 5 MG TABS tablet, TAKE 1 TABLET BY MOUTH TWICE A DAY, Disp: 180 tablet, Rfl: 3   HYDROcodone-acetaminophen (NORCO) 5-325 MG tablet, Take 1 tablet by mouth every 6 (six) hours as needed for moderate pain., Disp: 30 tablet, Rfl: 0   lamoTRIgine (LAMICTAL) 150 MG tablet, Take 2 tablets (300 mg total) by mouth at bedtime., Disp: 180 tablet, Rfl: 1    lisinopril (ZESTRIL) 20 MG tablet, TAKE 1 TABLET BY MOUTH EVERY DAY, Disp: 90 tablet, Rfl: 1   Magnesium 400 MG CAPS, Take 400 mg by mouth at bedtime., Disp: , Rfl:    predniSONE (DELTASONE) 10 MG tablet, Take 1 tablet (10 mg total) by mouth 2 (two) times daily with a meal., Disp: 30 tablet, Rfl: 0   risperiDONE (RISPERDAL) 3 MG tablet, Take 1 tablet (3 mg total) by mouth at bedtime., Disp: 90 tablet, Rfl: 1   thiamine (VITAMIN B1) 100 MG tablet, Take 100 mg by mouth daily., Disp: , Rfl:    traMADol (ULTRAM) 50 MG tablet, Take 2 tablets (100 mg total) by mouth every 6 (six) hours as needed for moderate pain., Disp: 60 tablet, Rfl: 0   traZODone (DESYREL) 50 MG tablet, Take 1-2 tablets (50-100 mg total) by mouth at bedtime as needed. for sleep, Disp: 180 tablet, Rfl: 1  Review of Systems:  Negative unless indicated in HPI.   Physical Exam: Vitals:   06/28/23 1131  BP: 130/70  Pulse: (!) 120  Temp: (!) 100.5 F (38.1 C)  TempSrc: Oral  SpO2: 98%  Weight: 138 lb 8 oz (62.8 kg)    Body mass index is 24.15 kg/m.   Physical Exam Vitals reviewed.  Constitutional:      Appearance: Normal appearance.  HENT:     Mouth/Throat:     Mouth: Mucous membranes are moist.     Pharynx: Oropharynx is clear.  Eyes:     Conjunctiva/sclera: Conjunctivae normal.     Pupils: Pupils are equal, round, and reactive to light.  Cardiovascular:     Rate and Rhythm: Normal rate and regular rhythm.  Pulmonary:     Effort: Pulmonary effort is normal.     Breath sounds: Normal breath sounds.  Neurological:     Mental Status: She is alert.      Impression and Plan:  Fever, unspecified fever cause -     POC COVID-19 BinaxNow  Myalgia  Back pain, unspecified back location, unspecified back pain laterality, unspecified chronicity   -Since only taking 5 mg of prednisone for the past week okay to discontinue altogether. -Suspect she has mild viral gastroenteritis, have advised plenty of rest and  fluids nothing further at present.  Time spent:20 minutes reviewing chart, interviewing and examining patient and formulating plan of care.     Chaya Jan, MD [facility] at [facility]

## 2023-07-03 ENCOUNTER — Encounter: Payer: PPO | Admitting: Rehabilitative and Restorative Service Providers"

## 2023-07-04 ENCOUNTER — Other Ambulatory Visit: Payer: Self-pay

## 2023-07-04 DIAGNOSIS — Z170 Estrogen receptor positive status [ER+]: Secondary | ICD-10-CM

## 2023-07-05 ENCOUNTER — Encounter: Payer: Self-pay | Admitting: Hematology

## 2023-07-05 ENCOUNTER — Inpatient Hospital Stay: Payer: PPO | Attending: Radiation Oncology

## 2023-07-05 ENCOUNTER — Inpatient Hospital Stay: Payer: PPO | Admitting: Hematology

## 2023-07-05 ENCOUNTER — Other Ambulatory Visit: Payer: Self-pay

## 2023-07-05 VITALS — BP 135/80 | HR 76 | Temp 98.7°F | Resp 18 | Ht 63.5 in | Wt 139.4 lb

## 2023-07-05 DIAGNOSIS — I10 Essential (primary) hypertension: Secondary | ICD-10-CM | POA: Diagnosis not present

## 2023-07-05 DIAGNOSIS — I480 Paroxysmal atrial fibrillation: Secondary | ICD-10-CM | POA: Diagnosis not present

## 2023-07-05 DIAGNOSIS — Z79899 Other long term (current) drug therapy: Secondary | ICD-10-CM | POA: Diagnosis not present

## 2023-07-05 DIAGNOSIS — Z923 Personal history of irradiation: Secondary | ICD-10-CM | POA: Insufficient documentation

## 2023-07-05 DIAGNOSIS — Z7901 Long term (current) use of anticoagulants: Secondary | ICD-10-CM | POA: Diagnosis not present

## 2023-07-05 DIAGNOSIS — Z170 Estrogen receptor positive status [ER+]: Secondary | ICD-10-CM

## 2023-07-05 DIAGNOSIS — C50412 Malignant neoplasm of upper-outer quadrant of left female breast: Secondary | ICD-10-CM | POA: Diagnosis not present

## 2023-07-05 DIAGNOSIS — Z87442 Personal history of urinary calculi: Secondary | ICD-10-CM | POA: Diagnosis not present

## 2023-07-05 DIAGNOSIS — Z9089 Acquired absence of other organs: Secondary | ICD-10-CM | POA: Insufficient documentation

## 2023-07-05 DIAGNOSIS — Z79811 Long term (current) use of aromatase inhibitors: Secondary | ICD-10-CM | POA: Insufficient documentation

## 2023-07-05 DIAGNOSIS — Z510 Encounter for antineoplastic radiation therapy: Secondary | ICD-10-CM | POA: Diagnosis present

## 2023-07-05 LAB — CMP (CANCER CENTER ONLY)
ALT: 10 U/L (ref 0–44)
AST: 12 U/L — ABNORMAL LOW (ref 15–41)
Albumin: 3.7 g/dL (ref 3.5–5.0)
Alkaline Phosphatase: 67 U/L (ref 38–126)
Anion gap: 6 (ref 5–15)
BUN: <5 mg/dL — ABNORMAL LOW (ref 8–23)
CO2: 29 mmol/L (ref 22–32)
Calcium: 9.6 mg/dL (ref 8.9–10.3)
Chloride: 94 mmol/L — ABNORMAL LOW (ref 98–111)
Creatinine: 0.49 mg/dL (ref 0.44–1.00)
GFR, Estimated: >60 mL/min (ref >60)
Glucose, Bld: 81 mg/dL (ref 70–99)
Potassium: 4 mmol/L (ref 3.5–5.1)
Sodium: 129 mmol/L — ABNORMAL LOW (ref 135–145)
Total Bilirubin: 0.5 mg/dL (ref 0.3–1.2)
Total Protein: 6.5 g/dL (ref 6.5–8.1)

## 2023-07-05 LAB — CBC WITH DIFFERENTIAL (CANCER CENTER ONLY)
Abs Immature Granulocytes: 0.12 10*3/uL — ABNORMAL HIGH (ref 0.00–0.07)
Basophils Absolute: 0 10*3/uL (ref 0.0–0.1)
Basophils Relative: 0 %
Eosinophils Absolute: 0.1 10*3/uL (ref 0.0–0.5)
Eosinophils Relative: 1 %
HCT: 33.4 % — ABNORMAL LOW (ref 36.0–46.0)
Hemoglobin: 11.6 g/dL — ABNORMAL LOW (ref 12.0–15.0)
Immature Granulocytes: 2 %
Lymphocytes Relative: 15 %
Lymphs Abs: 1 10*3/uL (ref 0.7–4.0)
MCH: 30.9 pg (ref 26.0–34.0)
MCHC: 34.7 g/dL (ref 30.0–36.0)
MCV: 89.1 fL (ref 80.0–100.0)
Monocytes Absolute: 0.7 10*3/uL (ref 0.1–1.0)
Monocytes Relative: 11 %
Neutro Abs: 4.5 10*3/uL (ref 1.7–7.7)
Neutrophils Relative %: 71 %
Platelet Count: 254 10*3/uL (ref 150–400)
RBC: 3.75 MIL/uL — ABNORMAL LOW (ref 3.87–5.11)
RDW: 14.1 % (ref 11.5–15.5)
WBC Count: 6.3 10*3/uL (ref 4.0–10.5)
nRBC: 0 % (ref 0.0–0.2)

## 2023-07-05 MED ORDER — ANASTROZOLE 1 MG PO TABS: 1.0000 mg | ORAL_TABLET | Freq: Every day | ORAL | 1 refills | Status: DC

## 2023-07-05 NOTE — Assessment & Plan Note (Signed)
-  pT1b,cN0M0, G1, ER+/PR+/HER2- -Found on screening mammogram, diagnosed in October 2023 -She underwent left breast lumpectomy, sentinel lymph nodes was not obtained due to the early stage disease and her age. -I discussed her surgical pathology findings, which is very favorable. -She completed adjuvant radiation -She started adjuvant anastrozole in February 2024

## 2023-07-05 NOTE — Progress Notes (Signed)
[facility]   [phone] [phone]   Clinic Follow up Note   Patient Care Team: Philip Aspen, Limmie Patricia, MD as PCP - General (Internal Medicine) Rollene Rotunda, MD as PCP - Cardiology (Cardiology) Group, [facility]) Margaret Mood, MD as Consulting Physician (Hematology) Harriette Bouillon, MD as Consulting Physician (General Surgery) Dorothy Puffer, MD as Consulting Physician (Radiation Oncology) Sherrill Raring, [facility] (Inactive) (Pharmacist)  Date of Service:  07/05/2023  CHIEF COMPLAINT: f/u of Left Breast Cancer, ER+   CURRENT THERAPY:  Anastrozole starting 01/04/2023  ASSESSMENT:  Margaret Mckenzie is a 72 y.o. female with   Malignant neoplasm of upper-outer quadrant of left breast in female, estrogen receptor positive (HCC) -pT1b,cN0M0, G1 -Found on screening mammogram, diagnosed in October 2023 -She underwent left breast lumpectomy, sentinel lymph nodes was not obtained due to the early stage disease and her age. -I discussed her surgical pathology findings, which is very favorable. -She completed adjuvant radiation -She started adjuvant anastrozole in February 2024 and tolerating very well -she is clinically doing well, lab reviewed, exam unremarkable, no concerns of cancer recurrence   PLAN: -continue anastrozole -she will call her PCP to repeat DEXA scan  -lab and f/u in 6 months with NP Mardella Layman  -mammogram in 08/2023, ordered   SUMMARY OF ONCOLOGIC HISTORY: Oncology History  Malignant neoplasm of upper-outer quadrant of left breast in female, estrogen receptor positive (HCC)  09/26/2022 Imaging   CLINICAL DATA:  72 year old female presenting as a recall from screening for possible left breast mass.   EXAM: DIGITAL DIAGNOSTIC UNILATERAL LEFT MAMMOGRAM WITH TOMOSYNTHESIS; ULTRASOUND LEFT BREAST LIMITED  IMPRESSION: Suspicious mass measuring 0.9 cm in the left breast at 2 o'clock.   09/29/2022  Cancer Staging   Staging form: Breast, AJCC 8th Edition - Clinical stage from 09/29/2022: Stage IA (cT1b, cN0, cM0, G2, ER+, PR+, HER2-) - Signed by Margaret Mood, MD on 10/10/2022 Stage prefix: Initial diagnosis Histologic grading system: 3 grade system   09/29/2022 Initial Biopsy   Diagnosis Breast, left, needle core biopsy, 2:00 7 cmfn, ribbon clip - INVASIVE DUCTAL CARCINOMA, SEE NOTE - TUBULE FORMATION: SCORE 3 - NUCLEAR PLEOMORPHISM: SCORE 2 - MITOTIC COUNT: SCORE 1 - TOTAL SCORE: 6 - OVERALL GRADE: 2 - LYMPHOVASCULAR INVASION: NOT IDENTIFIED - CANCER LENGTH: 0.6 CM - CALCIFICATIONS: NOT IDENTIFIED  PROGNOSTIC INDICATORS Results: The tumor cells are negative for Her2 (1+). Estrogen Receptor: 95%, POSITIVE, STRONG STAINING INTENSITY Progesterone Receptor: 100%, POSITIVE, STRONG STAINING INTENSITY Proliferation Marker Ki67: 10%   10/09/2022 Initial Diagnosis   Malignant neoplasm of upper-outer quadrant of left breast in female, estrogen receptor positive (HCC)   11/02/2022 Cancer Staging   Staging form: Breast, AJCC 8th Edition - Pathologic stage from 11/02/2022: Stage Unknown (pT1b, pNX, cM0, G1, ER+, PR+, HER2-) - Signed by Margaret Mood, MD on 01/04/2023 Stage prefix: Initial diagnosis Histologic grading system: 3 grade system Residual tumor (R): R0 - None   12/12/2022 - 01/09/2023 Radiation Therapy   Site Technique Total Dose (Gy) Dose per Fx (Gy) Completed Fx Beam Energies  Breast, Left: Breast_L 3D 42.56/42.56 2.66 16/16 10XFFF  Breast, Left: Breast_L_Bst 3D 8/8 2 4/4 6X, 10X     12/2022 -  Anti-estrogen oral therapy   Anastrozole      INTERVAL HISTORY:  Margaret Mckenzie is here for a follow up of  Left Breast Cancer, ER+ She was last seen by me on 01/04/2023. She presents to the clinic alone.  Tolerating anastrozole very well,  no noticeable side effects  Feels well, no complains.        All other systems were reviewed with the patient and are negative.  MEDICAL  HISTORY:  Past Medical History:  Diagnosis Date   Anxiety    Bipolar 1 disorder (HCC)    Chicken pox    Depression    Hypertension    high blood pressure readings    Kidney disease    kidney stones and saw urologist, Dr. Dillard Cannon for lithotripsy remotely    PAF (paroxysmal atrial fibrillation) [facility])     SURGICAL HISTORY: Past Surgical History:  Procedure Laterality Date   BREAST BIOPSY  10/31/2022   MM LT RADIOACTIVE SEED LOC MAMMO GUIDE 10/31/2022 [facility] MAMMOGRAP[facility]   BREAST LUMPECTOMY WITH RADIOACTIVE SEED LOCALIZATION Left 11/02/2022   Procedure: LEFT BREAST LUMPECTOMY WITH RADIOACTIVE SEED LOCALIZATION;  Surgeon: Harriette Bouillon, MD;  Location: [facility];  Service: General;  Laterality: Left;   BUNIONECTOMY  2007   TONSILLECTOMY AND ADENOIDECTOMY  1975    I have reviewed the social history and family history with the patient and they are unchanged from previous note.  ALLERGIES:  is allergic to nefazodone and trazodone.  MEDICATIONS:  Current Outpatient Medications  Medication Sig Dispense Refill   acamprosate (CAMPRAL) 333 MG tablet Take 2 tablets (666 mg total) by mouth 3 (three) times daily with meals. 180 tablet 1   anastrozole (ARIMIDEX) 1 MG tablet Take 1 tablet (1 mg total) by mouth daily. 90 tablet 1   Ascorbic Acid (VITAMIN C) 1000 MG tablet Take 1,000 mg by mouth at bedtime.     busPIRone (BUSPAR) 15 MG tablet Take 1 tablet (15 mg total) by mouth 2 (two) times daily. 180 tablet 1   Calcium Carbonate (CALCIUM 500 PO) Take 1 tablet by mouth at bedtime.     Cholecalciferol (VITAMIN D-3) 25 MCG (1000 UT) CAPS Take 4,000 Units by mouth at bedtime.     clonazePAM (KLONOPIN) 1 MG tablet Take 1 tablet (1 mg total) by mouth 2 (two) times daily as needed for anxiety. And may take an extra 1/2 pill mid-day prn 75 tablet 1   cyanocobalamin (VITAMIN B12) 1000 MCG/ML injection INJECT 1 ML (1,000 MCG) INTRAMUSCULARLY EVERY 30 DAYS 9 mL 1   DILT-XR 180 MG 24 hr  capsule TAKE 1 CAPSULE BY MOUTH EVERY DAY 90 capsule 3   diltiazem (CARDIZEM) 30 MG tablet Take 1 tablet every 4 hours AS NEEDED for AFIB heart rate >100 45 tablet 1   ELIQUIS 5 MG TABS tablet TAKE 1 TABLET BY MOUTH TWICE A DAY 180 tablet 3   HYDROcodone-acetaminophen (NORCO) 5-325 MG tablet Take 1 tablet by mouth every 6 (six) hours as needed for moderate pain. 30 tablet 0   lamoTRIgine (LAMICTAL) 150 MG tablet Take 2 tablets (300 mg total) by mouth at bedtime. 180 tablet 1   lisinopril (ZESTRIL) 20 MG tablet TAKE 1 TABLET BY MOUTH EVERY DAY 90 tablet 1   Magnesium 400 MG CAPS Take 400 mg by mouth at bedtime.     risperiDONE (RISPERDAL) 3 MG tablet Take 1 tablet (3 mg total) by mouth at bedtime. 90 tablet 1   thiamine (VITAMIN B1) 100 MG tablet Take 100 mg by mouth daily.     traZODone (DESYREL) 50 MG tablet Take 1-2 tablets (50-100 mg total) by mouth at bedtime as needed. for sleep 180 tablet 1   No current facility-administered medications for this visit.    PHYSICAL EXAMINATION: ECOG  PERFORMANCE STATUS: 0 - Asymptomatic  Vitals:   07/05/23 1407  BP: 135/80  Pulse: 76  Resp: 18  Temp: 98.7 F (37.1 C)  SpO2: 98%   Wt Readings from Last 3 Encounters:  07/05/23 139 lb 6.4 oz (63.2 kg)  06/28/23 138 lb 8 oz (62.8 kg)  06/14/23 135 lb (61.2 kg)     GENERAL:alert, no distress and comfortable SKIN: skin color normal, no rashes or significant lesions EYES: normal, Conjunctiva are pink and non-injected, sclera clear  NEURO: alert & oriented x 3 with fluent speech    LABORATORY DATA:  I have reviewed the data as listed    Latest Ref Rng & Units 07/05/2023    1:24 PM 11/06/2022    9:13 AM 10/11/2022   12:05 PM  CBC  WBC 4.0 - 10.5 K/uL 6.3  5.2  7.5   Hemoglobin 12.0 - 15.0 g/dL 16.1  09.6  04.5   Hematocrit 36.0 - 46.0 % 33.4  40.8  36.1   Platelets 150 - 400 K/uL 254  251.0  221         Latest Ref Rng & Units 07/05/2023    1:24 PM 12/13/2022    1:24 PM 11/06/2022     9:13 AM  CMP  Glucose 70 - 99 mg/dL 81  96  81   BUN 8 - 23 mg/dL 5  5  5    Creatinine 0.44 - 1.00 mg/dL 4.09  8.11  9.14   Sodium 135 - 145 mmol/L 129  136  135   Potassium 3.5 - 5.1 mmol/L 4.0  3.7  4.2   Chloride 98 - 111 mmol/L 94  98  95   CO2 22 - 32 mmol/L 29  31  30    Calcium 8.9 - 10.3 mg/dL 9.6  9.6  9.5   Total Protein 6.5 - 8.1 g/dL 6.5   7.1   Total Bilirubin 0.3 - 1.2 mg/dL 0.5   0.5   Alkaline Phos 38 - 126 U/L 67   73   AST 15 - 41 U/L 12   59   ALT 0 - 44 U/L 10   60       RADIOGRAPHIC STUDIES: I have personally reviewed the radiological images as listed and agreed with the findings in the report. No results found.    Orders Placed This Encounter  Procedures   MM 3D SCREENING MAMMOGRAM BILATERAL BREAST    Standing Status:   Future    Standing Expiration Date:   07/04/2024    Order Specific Question:   Reason for Exam (SYMPTOM  OR DIAGNOSIS REQUIRED)    Answer:   screening    Order Specific Question:   Preferred imaging location?    Answer:   [facility]   All questions were answered. The patient knows to call the clinic with any problems, questions or concerns. No barriers to learning was detected. The total time spent in the appointment was 20 minutes.     Margaret Mood, MD 07/05/2023

## 2023-07-10 ENCOUNTER — Encounter: Payer: PPO | Admitting: Rehabilitative and Restorative Service Providers"

## 2023-07-12 ENCOUNTER — Encounter: Payer: PPO | Admitting: Rehabilitative and Restorative Service Providers"

## 2023-07-17 ENCOUNTER — Other Ambulatory Visit: Payer: Self-pay | Admitting: Physician Assistant

## 2023-07-24 ENCOUNTER — Other Ambulatory Visit: Payer: Self-pay

## 2023-07-26 ENCOUNTER — Other Ambulatory Visit: Payer: Self-pay | Admitting: Cardiology

## 2023-07-26 DIAGNOSIS — I480 Paroxysmal atrial fibrillation: Secondary | ICD-10-CM

## 2023-07-26 NOTE — Telephone Encounter (Signed)
Eliquis 5mg  refill request received. Patient is 72 years old, weight-63.2kg, Crea-0.49 on 07/05/23, Diagnosis-Afib, and last seen by Ronie Spies on 10/30/22 & pending appt with Dr. Antoine Poche on 08/24/23. Dose is appropriate based on dosing criteria. Will send in refill to requested pharmacy.

## 2023-08-05 ENCOUNTER — Encounter: Payer: Self-pay | Admitting: Cardiology

## 2023-08-05 DIAGNOSIS — I480 Paroxysmal atrial fibrillation: Secondary | ICD-10-CM

## 2023-08-06 MED ORDER — APIXABAN 5 MG PO TABS
5.0000 mg | ORAL_TABLET | Freq: Two times a day (BID) | ORAL | 5 refills | Status: DC
Start: 2023-08-06 — End: 2023-08-17

## 2023-08-08 ENCOUNTER — Telehealth: Payer: Self-pay | Admitting: *Deleted

## 2023-08-08 NOTE — Telephone Encounter (Signed)
   Pre-operative Risk Assessment    Patient Name: Margaret Mckenzie  DOB: [birth year] MRN: [id number]    DATE OF LAST VISIT: 10/30/22 Ronie Spies, PAC DATE OF NEXT VISIT: 08/17/23 DR. HOCHREIN  Request for Surgical Clearance    Procedure:   L1-L2-LEFT ESI  Date of Surgery:  Clearance TBD                                 Surgeon:  DR. DAVE [facility] Surgeon's Group or Practice Name:  [facility] Phone number:  [phone] Fax number:  [phone]   Type of Clearance Requested:   - Medical  - Pharmacy:  Hold Apixaban (Eliquis) x 3 DAYS PRIOR AND RESUME THE DAY AFTER PROCEDURE   Type of Anesthesia:  Not Indicated   Additional requests/questions:    Elpidio Anis   08/08/2023, 4:15 PM

## 2023-08-09 NOTE — Telephone Encounter (Signed)
Patient with diagnosis of afib on Eliquis for anticoagulation.    Procedure: L1-L2-LEFT ESI  Date of procedure: TBD   CHA2DS2-VASc Score = 3   This indicates a 3.2% annual risk of stroke. The patient's score is based upon: CHF History: 0 HTN History: 1 Diabetes History: 0 Stroke History: 0 Vascular Disease History: 0 Age Score: 1 Gender Score: 1      CrCl 87 ml/min  Per office protocol, patient can hold Eliquis for 3 days prior to procedure.    **This guidance is not considered finalized until pre-operative APP has relayed final recommendations.**

## 2023-08-09 NOTE — Telephone Encounter (Signed)
   Name: Margaret Mckenzie  DOB: [birth year]  MRN: [id number]  Primary Cardiologist: Rollene Rotunda, MD  Chart reviewed as part of pre-operative protocol coverage. The patient has an upcoming visit scheduled with Dr. Antoine Poche on 9/20/2024at which time clearance can be addressed in case there are any issues that would impact surgical recommendations.  L1-L2 left ESI is not scheduled until TBD as below. I added preop FYI to appointment note so that provider is aware to address at time of outpatient visit.  Per office protocol the cardiology provider should forward their finalized clearance decision and recommendations regarding antiplatelet therapy to the requesting party below.    Per office protocol, patient can hold Eliquis for 3 days prior to procedure.    I will route this message as FYI to requesting party and remove this message from the preop box as separate preop APP input not needed at this time.   Please call with any questions.  Joylene Grapes, NP  08/09/2023, 8:21 AM

## 2023-08-10 ENCOUNTER — Ambulatory Visit (INDEPENDENT_AMBULATORY_CARE_PROVIDER_SITE_OTHER): Payer: PPO | Admitting: Physician Assistant

## 2023-08-10 ENCOUNTER — Encounter: Payer: Self-pay | Admitting: Physician Assistant

## 2023-08-10 DIAGNOSIS — F319 Bipolar disorder, unspecified: Secondary | ICD-10-CM | POA: Diagnosis not present

## 2023-08-10 DIAGNOSIS — G4700 Insomnia, unspecified: Secondary | ICD-10-CM | POA: Diagnosis not present

## 2023-08-10 DIAGNOSIS — F411 Generalized anxiety disorder: Secondary | ICD-10-CM

## 2023-08-10 DIAGNOSIS — Z87898 Personal history of other specified conditions: Secondary | ICD-10-CM

## 2023-08-10 MED ORDER — LAMOTRIGINE 150 MG PO TABS: 300.0000 mg | ORAL_TABLET | Freq: Every day | ORAL | 1 refills | Status: DC

## 2023-08-10 MED ORDER — DIAZEPAM 10 MG PO TABS: 5.0000 mg | ORAL_TABLET | Freq: Three times a day (TID) | ORAL | 0 refills | Status: DC | PRN

## 2023-08-10 MED ORDER — BUSPIRONE HCL 15 MG PO TABS: 15.0000 mg | ORAL_TABLET | Freq: Three times a day (TID) | ORAL | 1 refills | Status: DC

## 2023-08-10 MED ORDER — RISPERIDONE 3 MG PO TABS: 3.0000 mg | ORAL_TABLET | Freq: Every day | ORAL | 1 refills | Status: DC

## 2023-08-10 MED ORDER — TRAZODONE HCL 50 MG PO TABS: 50.0000 mg | ORAL_TABLET | Freq: Every evening | ORAL | 1 refills | Status: DC | PRN

## 2023-08-10 NOTE — Patient Instructions (Signed)
Increase Buspar 15 mg to one pill three times a day.

## 2023-08-10 NOTE — Progress Notes (Unsigned)
[facility]  Patient ID: Margaret Mckenzie,  MRN: [id number]  PCP: Philip Aspen, Limmie Patricia, MD  Date of Evaluation: 08/10/2023 Time spent:20 minutes  Chief Complaint:  Chief Complaint   Anxiety; Depression; Follow-up; Insomnia     HISTORY/CURRENT STATUS: HPI  For routine med check.       Patient denies increased energy with decreased need for sleep, increased talkativeness, racing thoughts, impulsivity or risky behaviors, increased spending, increased libido, grandiosity, increased irritability or anger, paranoia, or hallucinations.  Denies dizziness, syncope, seizures, numbness, tingling, tremor, tics, unsteady gait, slurred speech, confusion. Denies dystonia.  Individual Medical History/ Review of Systems: Changes? :Yes  See HPI  Past medications for mental health diagnoses include: Depakote, Lamictal, Prozac, Lexapro, Xanax, Sonata caused amnesia, Risperdal, Trazodone ineffective at low doses but caused dizziness at higher doses.  Librium taper for detox, toward the end of the treatment she had issues with balance.  Acamprosate, questionably caused imbalance.  Allergies: Nefazodone and Trazodone  Current Medications:  Current Outpatient Medications:    acamprosate (CAMPRAL) 333 MG tablet, Take 2 tablets (666 mg total) by mouth 3 (three) times daily with meals., Disp: 180 tablet, Rfl: 1   anastrozole (ARIMIDEX) 1 MG tablet, Take 1 tablet (1 mg total) by mouth daily., Disp: 90 tablet, Rfl: 1   apixaban (ELIQUIS) 5 MG TABS tablet, Take 1 tablet (5 mg total) by mouth 2 (two) times daily., Disp: 60 tablet, Rfl: 5   Ascorbic Acid (VITAMIN C) 1000 MG tablet, Take 1,000 mg by mouth at bedtime., Disp: , Rfl:    Calcium Carbonate (CALCIUM 500 PO), Take 1 tablet by mouth at bedtime., Disp: , Rfl:    Cholecalciferol (VITAMIN D-3) 25 MCG (1000 UT) CAPS, Take 4,000 Units by mouth at bedtime., Disp: , Rfl:    cyanocobalamin (VITAMIN B12) 1000 MCG/ML injection,  INJECT 1 ML (1,000 MCG) INTRAMUSCULARLY EVERY 30 DAYS, Disp: 9 mL, Rfl: 1   diazepam (VALIUM) 10 MG tablet, Take 0.5-1 tablets (5-10 mg total) by mouth every 8 (eight) hours as needed for anxiety., Disp: 90 tablet, Rfl: 0   DILT-XR 180 MG 24 hr capsule, TAKE 1 CAPSULE BY MOUTH EVERY DAY, Disp: 90 capsule, Rfl: 3   diltiazem (CARDIZEM) 30 MG tablet, Take 1 tablet every 4 hours AS NEEDED for AFIB heart rate >100, Disp: 45 tablet, Rfl: 1   lisinopril (ZESTRIL) 20 MG tablet, TAKE 1 TABLET BY MOUTH EVERY DAY, Disp: 90 tablet, Rfl: 1   Magnesium 400 MG CAPS, Take 400 mg by mouth at bedtime., Disp: , Rfl:    thiamine (VITAMIN B1) 100 MG tablet, Take 100 mg by mouth daily., Disp: , Rfl:    busPIRone (BUSPAR) 15 MG tablet, Take 1 tablet (15 mg total) by mouth 3 (three) times daily., Disp: 270 tablet, Rfl: 1   HYDROcodone-acetaminophen (NORCO) 5-325 MG tablet, Take 1 tablet by mouth every 6 (six) hours as needed for moderate pain. (Patient not taking: Reported on 08/10/2023), Disp: 30 tablet, Rfl: 0   lamoTRIgine (LAMICTAL) 150 MG tablet, Take 2 tablets (300 mg total) by mouth at bedtime., Disp: 180 tablet, Rfl: 1   risperiDONE (RISPERDAL) 3 MG tablet, Take 1 tablet (3 mg total) by mouth at bedtime., Disp: 90 tablet, Rfl: 1   traZODone (DESYREL) 50 MG tablet, Take 1-2 tablets (50-100 mg total) by mouth at bedtime as needed. for sleep, Disp: 180 tablet, Rfl: 1 Medication Side Effects: none  Family Medical/ Social History: Changes?  No  MENTAL HEALTH EXAM:  There were no vitals taken for this visit.There is no height or weight on file to calculate BMI.  General Appearance: Casual, Neat, and Well Groomed  Eye Contact:  Good  Speech:  Clear and Coherent and Normal Rate  Volume:  Normal  Mood:  Euthymic  Affect:  Congruent  Thought Process:  Goal Directed and Descriptions of Associations: Circumstantial  Orientation:  Full (Time, Place, and Person)  Thought Content: Logical   Suicidal Thoughts:  No   Homicidal Thoughts:  No  Memory:  WNL  Judgement:  Good  Insight:  Good  Psychomotor Activity:  Normal  Concentration:  Concentration: Good and Attention Span: Good  Recall:  Good  Fund of Knowledge: Good  Language: Good  Assets:  Desire for Improvement Financial Resources/Insurance Housing Transportation Vocational/Educational  ADL's:  Intact  Cognition: WNL  Prognosis:  Good   DIAGNOSES:  No diagnosis found.   Receiving Psychotherapy: Yes   Matt Schooler  RECOMMENDATIONS:  PDMP was reviewed.  Last Klonopin filled 07/18/2023.     Continue acamprosate 333 mg, 2 p.o. 3 times daily. Continue BuSpar 15 mg, 1 p.o. twice daily. Continue Klonopin 1 mg, 1 p.o. twice daily as needed and may take 1/2 pill midday as needed. Continue Lamictal 150 mg, 2 p.o. nightly. Continue Risperdal 3 mg, 1 p.o. nightly. Continue trazodone 50 mg, 1/2-2 p.o. nightly as needed sleep. Continue therapy with Georgiann Mohs.  Continue AA or [facility]. Return in   [place], [place]

## 2023-08-12 ENCOUNTER — Other Ambulatory Visit: Payer: Self-pay | Admitting: Physician Assistant

## 2023-08-15 ENCOUNTER — Encounter: Payer: Self-pay | Admitting: Cardiology

## 2023-08-16 NOTE — Progress Notes (Signed)
Cardiology Office Note:   Date:  08/17/2023  ID:  Margaret Mckenzie 1951/08/27, MRN [id number] PCP: Philip Aspen, Limmie Patricia, MD  [facility] Providers Cardiologist:  Rollene Rotunda, MD {  History of Present Illness:   Margaret Mckenzie is a 72 y.o. female who presents for evaluation of atrial fib.  She was noted to be in this in her PCP office in July of 2018.  She as been seen in the [facility] Clinic.  She did have an echocardiogram and this demonstrated some LVH and was otherwise unremarkable.  She was in the ED in Dec 2019 for PAF.  This has progressed to chronic atrial fib   Since I last saw her she has had no new cardiovascular complaints. The patient denies any new symptoms such as chest discomfort, neck or arm discomfort. There has been no new shortness of breath, PND or orthopnea. There have been no reported palpitations, presyncope or syncope.    ROS: As stated in the HPI and negative for all other systems.  Studies Reviewed:    EKG:   EKG Interpretation Date/Time:  Friday August 17 2023 12:07:44 EDT Ventricular Rate:  90 PR Interval:    QRS Duration:  86 QT Interval:  372 QTC Calculation: 455 R Axis:   68  Text Interpretation: Atrial flutter with variable A-V block with premature ventricular or aberrantly conducted complexes Low voltage QRS Cannot rule out Anteroseptal infarct , age undetermined When compared with ECG of 15-Jul-2021 21:50, No significant change was found Confirmed by Rollene Rotunda ([id number]) on 08/17/2023 12:17:30 PM   Mom  Risk Assessment/Calculations:    CHA2DS2-VASc Score = 3   This indicates a 3.2% annual risk of stroke. The patient's score is based upon: CHF History: 0 HTN History: 1 Diabetes History: 0 Stroke History: 0 Vascular Disease History: 0 Age Score: 1 Gender Score: 1    Physical Exam:   VS:  BP (!) 140/90 (BP Location: Left Arm, Cuff Size: Normal)   Pulse 90   Ht 5\' 4"  (1.626 m)   Wt 143 lb 12.8 oz (65.2  kg)   SpO2 98%   BMI 24.68 kg/m    Wt Readings from Last 3 Encounters:  08/17/23 143 lb 12.8 oz (65.2 kg)  07/05/23 139 lb 6.4 oz (63.2 kg)  06/28/23 138 lb 8 oz (62.8 kg)     GEN: Well nourished, well developed in no acute distress NECK: No JVD; No carotid bruits CARDIAC: Irregular RR, no murmurs, rubs, gallops RESPIRATORY:  Clear to auscultation without rales, wheezing or rhonchi  ABDOMEN: Soft, non-tender, non-distended EXTREMITIES:  No edema; No deformity   ASSESSMENT AND PLAN:   ATRIAL FIB.  [name] has a CHA2DS2 - VASc score of 3.  She tolerates anticoagulation.  No change in therapy.  HTN:  The blood pressure is elevated but she is having a panic attack today.  She says it is usually well-controlled at home and she will keep a blood pressure diary and let me know.  If it is elevated consistently at home I will dose adjust her meds.     Follow up with me in one year.   Signed, Rollene Rotunda, MD

## 2023-08-17 ENCOUNTER — Ambulatory Visit: Payer: PPO | Attending: Cardiology | Admitting: Cardiology

## 2023-08-17 ENCOUNTER — Encounter: Payer: Self-pay | Admitting: Cardiology

## 2023-08-17 VITALS — BP 140/90 | HR 90 | Ht 64.0 in | Wt 143.8 lb

## 2023-08-17 DIAGNOSIS — I482 Chronic atrial fibrillation, unspecified: Secondary | ICD-10-CM | POA: Diagnosis not present

## 2023-08-17 DIAGNOSIS — I480 Paroxysmal atrial fibrillation: Secondary | ICD-10-CM | POA: Diagnosis not present

## 2023-08-17 DIAGNOSIS — Z01810 Encounter for preprocedural cardiovascular examination: Secondary | ICD-10-CM

## 2023-08-17 MED ORDER — APIXABAN 5 MG PO TABS
5.0000 mg | ORAL_TABLET | Freq: Two times a day (BID) | ORAL | 0 refills | Status: DC
Start: 2023-08-17 — End: 2024-03-31

## 2023-08-17 NOTE — Patient Instructions (Signed)

## 2023-08-21 ENCOUNTER — Encounter: Payer: Self-pay | Admitting: Cardiology

## 2023-08-22 NOTE — Telephone Encounter (Signed)
Patient Name: [name]  DOB: 10-14-1951 MRN: [id number]  Primary Cardiologist: Rollene Rotunda, MD  Chart reviewed as part of pre-operative protocol coverage. Asked to revisit per patient advice request. At time of original clearance input, preop NP recommended patient keep f/u with Dr. Antoine Poche 08/17/23 for purposes of preop eval. Per review of OV, patient was doing well with recommendation for 1 year follow-up. The only concern appeared to be slightly elevated BP which Dr. Antoine Poche planned to follow in setting of panic attack since BP is usually well controlled at home. Therefore, based on ACC/AHA guidelines, [name] would be at acceptable risk for the planned procedure without further cardiovascular testing.   Per pharmD review, per office protocol, patient can hold Eliquis for 3 days prior to procedure.    Will route this bundled recommendation to requesting provider via Epic fax function. Please call with questions.  Laurann Montana, PA-C 08/22/2023, 3:00 PM

## 2023-08-22 NOTE — Telephone Encounter (Signed)
At time of original clearance input, preop NP recommended patient keep f/u with Dr. Antoine Poche 08/17/23 for purposes of preop eval since medical clearance was also requested for steroid injection. Per review of OV only concern appeared to be slightly elevated BP which Dr. Antoine Poche planned to follow in setting of panic attack since BP is usually well controlled at home. I think she can be cleared to proceed. I will go back and edit the original clearance request and forward to requesting party.

## 2023-08-24 ENCOUNTER — Ambulatory Visit: Payer: PPO | Admitting: Cardiology

## 2023-08-28 ENCOUNTER — Other Ambulatory Visit: Payer: Self-pay

## 2023-08-28 ENCOUNTER — Emergency Department (HOSPITAL_COMMUNITY)
Admission: EM | Admit: 2023-08-28 | Discharge: 2023-08-28 | Disposition: A | Discharge status: Home / Self Care | Payer: PPO | Attending: Emergency Medicine | Admitting: Emergency Medicine

## 2023-08-28 ENCOUNTER — Encounter (HOSPITAL_COMMUNITY): Payer: Self-pay

## 2023-08-28 ENCOUNTER — Emergency Department (HOSPITAL_COMMUNITY): Payer: PPO

## 2023-08-28 DIAGNOSIS — Z7901 Long term (current) use of anticoagulants: Secondary | ICD-10-CM | POA: Diagnosis not present

## 2023-08-28 DIAGNOSIS — R1011 Right upper quadrant pain: Secondary | ICD-10-CM | POA: Diagnosis not present

## 2023-08-28 DIAGNOSIS — M791 Myalgia, unspecified site: Secondary | ICD-10-CM | POA: Insufficient documentation

## 2023-08-28 DIAGNOSIS — M7918 Myalgia, other site: Secondary | ICD-10-CM

## 2023-08-28 DIAGNOSIS — Q630 Accessory kidney: Secondary | ICD-10-CM | POA: Diagnosis not present

## 2023-08-28 DIAGNOSIS — N200 Calculus of kidney: Secondary | ICD-10-CM | POA: Diagnosis not present

## 2023-08-28 DIAGNOSIS — N2889 Other specified disorders of kidney and ureter: Secondary | ICD-10-CM | POA: Diagnosis not present

## 2023-08-28 DIAGNOSIS — K5730 Diverticulosis of large intestine without perforation or abscess without bleeding: Secondary | ICD-10-CM | POA: Diagnosis not present

## 2023-08-28 LAB — URINALYSIS, ROUTINE W REFLEX MICROSCOPIC
Bilirubin Urine: NEGATIVE
Glucose, UA: NEGATIVE mg/dL
Ketones, ur: 5 mg/dL — AB
Nitrite: NEGATIVE
Protein, ur: NEGATIVE mg/dL
Specific Gravity, Urine: 1.001 — ABNORMAL LOW (ref 1.005–1.030)
pH: 7 (ref 5.0–8.0)

## 2023-08-28 LAB — COMPREHENSIVE METABOLIC PANEL
ALT: 14 U/L (ref 0–44)
AST: 20 U/L (ref 15–41)
Albumin: 4.6 g/dL (ref 3.5–5.0)
Alkaline Phosphatase: 69 U/L (ref 38–126)
Anion gap: 8 (ref 5–15)
BUN: 8 mg/dL (ref 8–23)
CO2: 28 mmol/L (ref 22–32)
Calcium: 10.6 mg/dL — ABNORMAL HIGH (ref 8.9–10.3)
Chloride: 91 mmol/L — ABNORMAL LOW (ref 98–111)
Creatinine, Ser: 0.63 mg/dL (ref 0.44–1.00)
GFR, Estimated: >60 mL/min (ref >60)
Glucose, Bld: 102 mg/dL — ABNORMAL HIGH (ref 70–99)
Potassium: 4.1 mmol/L (ref 3.5–5.1)
Sodium: 127 mmol/L — ABNORMAL LOW (ref 135–145)
Total Bilirubin: 1 mg/dL (ref 0.3–1.2)
Total Protein: 7.4 g/dL (ref 6.5–8.1)

## 2023-08-28 LAB — LIPASE, BLOOD: Lipase: 22 U/L (ref 11–51)

## 2023-08-28 LAB — CBC WITH DIFFERENTIAL/PLATELET
Abs Immature Granulocytes: 0.03 10*3/uL (ref 0.00–0.07)
Basophils Absolute: 0.1 10*3/uL (ref 0.0–0.1)
Basophils Relative: 1 %
Eosinophils Absolute: 0 10*3/uL (ref 0.0–0.5)
Eosinophils Relative: 0 %
HCT: 39.5 % (ref 36.0–46.0)
Hemoglobin: 12.9 g/dL (ref 12.0–15.0)
Immature Granulocytes: 0 %
Lymphocytes Relative: 11 %
Lymphs Abs: 0.8 10*3/uL (ref 0.7–4.0)
MCH: 31.2 pg (ref 26.0–34.0)
MCHC: 32.7 g/dL (ref 30.0–36.0)
MCV: 95.6 fL (ref 80.0–100.0)
Monocytes Absolute: 0.5 10*3/uL (ref 0.1–1.0)
Monocytes Relative: 6 %
Neutro Abs: 5.7 10*3/uL (ref 1.7–7.7)
Neutrophils Relative %: 82 %
Platelets: 231 10*3/uL (ref 150–400)
RBC: 4.13 MIL/uL (ref 3.87–5.11)
RDW: 13.6 % (ref 11.5–15.5)
WBC: 7.1 10*3/uL (ref 4.0–10.5)
nRBC: 0 % (ref 0.0–0.2)

## 2023-08-28 MED ORDER — IOHEXOL 300 MG/ML  SOLN
100.0000 mL | Freq: Once | INTRAMUSCULAR | Status: AC | PRN
  Administered 2023-08-28: 100 mL via INTRAVENOUS

## 2023-08-28 MED ORDER — SODIUM CHLORIDE (PF) 0.9 % IJ SOLN
INTRAMUSCULAR | Status: AC
  Filled 2023-08-28: qty 50

## 2023-08-28 NOTE — ED Notes (Signed)
Pt ambulatory to bathroom for urine sample.

## 2023-08-28 NOTE — Discharge Instructions (Addendum)
Thank you for letting us evaluate you today.  Your lab work is not significant for an elevated white count, anemia, infection.  Your urine is not infected and not requiring antibiotics at this time.  Your CT is not significant for acute pathology to explain symptoms.    Please take Tylenol or ibuprofen, lidocaine patches, voltaren gel to area as needed for pain.  Follow-up with primary care provider regarding pain and routine medical maintenance   please return to emergency department if you experience incontinence of urine or stool, back pain with urinary symptoms and fever, worsening of pain, pain with associated paresthesia in the legs

## 2023-08-28 NOTE — ED Triage Notes (Signed)
Patient reports right sided flank pain starting this AM. Denies injury to area, nausea, vomiting, diarrhea, fever, and urinary symptoms.   VSS, NAD

## 2023-08-28 NOTE — ED Provider Notes (Signed)
[place] EMERGENCY DEPARTMENT AT [facility] Provider Note   CSN: [id number] Arrival date & time: 08/28/23  1116     History  Chief Complaint  Patient presents with   Flank Pain    Margaret Mckenzie is a 72 y.o. female with past medical history of anxiety, bipolar 1, history of EtOH abuse,  left breast cancer, A-fib (on Eliquis) presents to emergency department for evaluation of right upper quadrant abdominal pain that radiates into her back that she noticed at 0500 this morning following getting up.  Pain started prior to her eating breakfast. Pain worsens with movement. She denies chest pain, shortness of breath, fever, nausea, vomiting, constipation, diarrhea, urinary symptoms, recent trauma.  The history is provided by the patient. No language interpreter was used.  Flank Pain Associated symptoms include abdominal pain. Pertinent negatives include no chest pain and no shortness of breath. The symptoms are aggravated by twisting and bending.      Home Medications Prior to Admission medications   Medication Sig Start Date End Date Taking? Authorizing Provider  acamprosate (CAMPRAL) 333 MG tablet Take 2 tablets (666 mg total) by mouth 3 (three) times daily with meals. 04/24/23   Cherie Ouch, PA-C  acamprosate (CAMPRAL) 333 MG tablet Take 666 mg by mouth 3 (three) times daily.    [provider]  anastrozole (ARIMIDEX) 1 MG tablet Take 1 tablet (1 mg total) by mouth daily. 07/05/23   Malachy Mood, MD  anastrozole (ARIMIDEX) 1 MG tablet Take 1 mg by mouth daily.    [provider]  apixaban (ELIQUIS) 5 MG TABS tablet Take 1 tablet (5 mg total) by mouth 2 (two) times daily. 08/17/23   Rollene Rotunda, MD  Ascorbic Acid (VITAMIN C) 1000 MG tablet Take 1,000 mg by mouth at bedtime.    [provider]  busPIRone (BUSPAR) 15 MG tablet Take 1 tablet (15 mg total) by mouth 3 (three) times daily. 08/10/23   Cherie Ouch, PA-C  Calcium Carbonate  (CALCIUM 500 PO) Take 1 tablet by mouth at bedtime.    [provider]  Cholecalciferol (VITAMIN D-3) 25 MCG (1000 UT) CAPS Take 4,000 Units by mouth at bedtime.    [provider]  cyanocobalamin (VITAMIN B12) 1000 MCG/ML injection INJECT 1 ML (1,000 MCG) INTRAMUSCULARLY EVERY 30 DAYS 11/21/22   Philip Aspen, Limmie Patricia, MD  diazepam (VALIUM) 10 MG tablet Take 0.5-1 tablets (5-10 mg total) by mouth every 8 (eight) hours as needed for anxiety. 08/10/23   Melony Overly T, PA-C  DILT-XR 180 MG 24 hr capsule TAKE 1 CAPSULE BY MOUTH EVERY DAY 11/24/22   Rollene Rotunda, MD  diltiazem (CARDIZEM) 30 MG tablet Take 1 tablet every 4 hours AS NEEDED for AFIB heart rate >100 12/21/20   Rollene Rotunda, MD  HYDROcodone-acetaminophen (NORCO) 5-325 MG tablet Take 1 tablet by mouth every 6 (six) hours as needed for moderate pain. 06/08/23   Nelwyn Salisbury, MD  lamoTRIgine (LAMICTAL) 150 MG tablet Take 2 tablets (300 mg total) by mouth at bedtime. 08/10/23   Melony Overly T, PA-C  lisinopril (ZESTRIL) 20 MG tablet TAKE 1 TABLET BY MOUTH EVERY DAY 04/30/23   Philip Aspen, Limmie Patricia, MD  Magnesium 400 MG CAPS Take 400 mg by mouth at bedtime.    [provider]  risperiDONE (RISPERDAL) 3 MG tablet TAKE 1 TABLET BY MOUTH AT BEDTIME. 08/12/23   Hurst, Rosey Bath T, PA-C  thiamine (VITAMIN B1) 100 MG tablet Take 100  mg by mouth daily.    [provider]  traMADol (ULTRAM) 50 MG tablet Take 50 mg by mouth every 6 (six) hours as needed.    [provider]  traZODone (DESYREL) 50 MG tablet Take 1-2 tablets (50-100 mg total) by mouth at bedtime as needed. for sleep 08/10/23   Melony Overly T, PA-C      Allergies    Nefazodone and Trazodone    Review of Systems   Review of Systems  Constitutional:  Negative for fatigue and fever.  Respiratory:  Negative for cough and shortness of breath.   Cardiovascular:  Negative for chest pain.  Gastrointestinal:  Positive for abdominal pain.  Negative for diarrhea, nausea and vomiting.  Genitourinary:  Positive for flank pain. Negative for decreased urine volume, dysuria, frequency, hematuria, pelvic pain, urgency and vaginal discharge.    Physical Exam Updated Vital Signs BP (!) 137/93   Pulse 77   Temp 98.1 F (36.7 C) (Oral)   Resp 16   Ht 5\' 4"  (1.626 m)   Wt 65.2 kg   SpO2 98%   BMI 24.68 kg/m  Physical Exam Vitals and nursing note reviewed.  Constitutional:      General: She is not in acute distress.    Appearance: Normal appearance.  HENT:     Head: Normocephalic and atraumatic.  Eyes:     Conjunctiva/sclera: Conjunctivae normal.  Cardiovascular:     Rate and Rhythm: Normal rate.  Pulmonary:     Effort: Pulmonary effort is normal. No respiratory distress.     Breath sounds: Normal breath sounds. No wheezing.  Abdominal:     General: Bowel sounds are normal. There is no distension.     Palpations: Abdomen is soft.     Tenderness: There is abdominal tenderness in the right upper quadrant. There is no right CVA tenderness, left CVA tenderness, guarding or rebound. Negative signs include Murphy's sign.  Musculoskeletal:     Cervical back: No deformity, signs of trauma or bony tenderness. No pain with movement. Normal range of motion.     Thoracic back: No swelling, edema, deformity, signs of trauma, tenderness or bony tenderness. Normal range of motion.     Lumbar back: Normal.  Skin:    Coloration: Skin is not jaundiced or pale.     Comments: No ecchymosis to abdomen or flanks  Neurological:     Mental Status: She is alert. Mental status is at baseline.     ED Results / Procedures / Treatments   Labs (all labs ordered are listed, but only abnormal results are displayed) Labs Reviewed  COMPREHENSIVE METABOLIC PANEL - Abnormal; Notable for the following components:      Result Value   Sodium 127 (*)    Chloride 91 (*)    Glucose, Bld 102 (*)    Calcium 10.6 (*)    All other components within  normal limits  URINALYSIS, ROUTINE W REFLEX MICROSCOPIC - Abnormal; Notable for the following components:   Color, Urine COLORLESS (*)    Specific Gravity, Urine 1.001 (*)    Hgb urine dipstick SMALL (*)    Ketones, ur 5 (*)    Leukocytes,Ua SMALL (*)    Bacteria, UA RARE (*)    All other components within normal limits  CBC WITH DIFFERENTIAL/PLATELET  LIPASE, BLOOD    EKG None  Radiology CT ABDOMEN PELVIS W CONTRAST  Result Date: 08/28/2023 CLINICAL DATA:  Epigastric pain and right flank pain beginning this morning. EXAM: CT ABDOMEN  AND PELVIS WITH CONTRAST TECHNIQUE: Multidetector CT imaging of the abdomen and pelvis was performed using the standard protocol following bolus administration of intravenous contrast. RADIATION DOSE REDUCTION: This exam was performed according to the departmental dose-optimization program which includes automated exposure control, adjustment of the mA and/or kV according to patient size and/or use of iterative reconstruction technique. CONTRAST:  OMNIPAQUE IOHEXOL 300 MG/ML  SOLN COMPARISON:  None Available. FINDINGS: Lower Chest: No acute findings. Hepatobiliary:  No suspicious hepatic masses identified. Pancreas:  No mass or inflammatory changes. Spleen: Within normal limits in size and appearance. Adrenals/Urinary Tract: No suspicious masses identified. Several small less than 5 mm calculi are seen in the upper pole of the left kidney, with mild renal parenchymal scarring. 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 No evidence of ureteral calculi or hydronephrosis. Stomach/Bowel: No evidence of obstruction, inflammatory process or abnormal fluid collections. Diverticulosis is seen mainly involving the descending and sigmoid colon, however there is no evidence of diverticulitis. Vascular/Lymphatic: No pathologically enlarged lymph nodes. No acute vascular findings. Reproductive:   No mass or other significant abnormality. Other:  None. Musculoskeletal:  No suspicious bone lesions identified. IMPRESSION: No acute findings. Left nephrolithiasis. No evidence of ureteral calculi or hydronephrosis. 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 Colonic diverticulosis, without radiographic evidence of diverticulitis. Electronically Signed   By: Danae Orleans M.D.   On: 08/28/2023 15:49    Procedures Procedures    Medications Ordered in ED Medications  iohexol (OMNIPAQUE) 300 MG/ML solution 100 mL (100 mLs Intravenous Contrast Given 08/28/23 1405)    ED Course/ Medical Decision Making/ A&P                                 Medical Decision Making   Patient presents to the ED for concern of right upper quadrant abdominal pain that radiates into right back, this involves an extensive number of treatment options, and is a complaint that carries with it a high risk of complications and morbidity.  The differential diagnosis includes dissection, acute cholecystitis, gastritis, pancreatitis, cholelithiasis, MSK   Co morbidities that complicate the patient evaluation  None   Additional history obtained:  External records from outside source obtained upon chart review   Lab Tests:  I Ordered, and personally interpreted labs.  The pertinent results include:  CBC: No leukocytosis and anemia CMP: electrolytes within her baseline Lipase negative   Imaging Studies ordered:  I ordered imaging studies including CT abdomen pelvis with contrast I independently visualized and interpreted imaging which showed no acute intra-abdominal pathology to explain symptoms See full CT for full results I agree with the radiologist interpretation    Medicines ordered and prescription drug management:  I offered Toradol for pain management however with shared decision making, patient preferred to take Tylenol or ibuprofen, lidocaine patches at  home   Test Considered:  I considered obtaining troponin; however, HPI and PE are not convincing for ACS as etiology of symptoms. Patient has no complaints of chest pain, dyspnea, N/V, pedal edema, epigastric pain    Problem List / ED Course:  Right upper quadrant abdominal pain Right thoracic back pain   Reevaluation:  After the interventions noted above, I reevaluated the patient and found that they have :stayed the same    Dispostion:  Upon evaluation, patient is sitting comfortably in chair  reading a book.  She is not ill-appearing and able to speak in full and complete sentences without difficulty. She does not appear to be in severe debilitating pain. VS WNL and is hemodynamically stable while in ED. Please see HPI.  Due to sudden RUQ pain with radiation to back with elevated age, will obtain CT abd pelvis to rule out acute pathology as etiology for symptoms (see report for full details). Labwork unremarkable for acute cytosis, anemia, electrolyte abnormality. negative lipase.  After consideration of the diagnostic results and the patients response to treatment, I feel that the patent would benefit from outpatient symptomatic management.  Based on HPI and reassuring CT abdomen pelvis that is negative for acute abdominal pathology to explain symptoms, pain is likely due to MSK etiology.  Shared decision making with patient regarding pain management in emergency department versus home supportive care. Patient prefers to take ibuprofen, lidocaine patches, rest, ice/heat at home for pain management as patient has not tried anything at home.  Patient is to follow-up with primary care as needed for symptomatic management.  Discussed return to emergency department precautions with patient to include but not limited to incontinence of urine or stool, back pain with urinary symptoms and fever, worsening of pain, pain with associated paresthesia in the legs.  Discussed imaging, physical exam,  labs, treatment plan with patient who expresses understanding and agrees with plan.        Final Clinical Impression(s) / ED Diagnoses Final diagnoses:  Musculoskeletal pain    Rx / DC Orders ED Discharge Orders     None         Judithann Sheen, PA 08/28/23 1606    Lorre Nick, MD 08/29/23 [phone]

## 2023-08-30 DIAGNOSIS — M5116 Intervertebral disc disorders with radiculopathy, lumbar region: Secondary | ICD-10-CM | POA: Diagnosis not present

## 2023-09-04 ENCOUNTER — Encounter: Payer: Self-pay | Admitting: Cardiology

## 2023-09-10 ENCOUNTER — Ambulatory Visit (INDEPENDENT_AMBULATORY_CARE_PROVIDER_SITE_OTHER): Payer: PPO | Admitting: Physician Assistant

## 2023-09-10 ENCOUNTER — Encounter: Payer: Self-pay | Admitting: Physician Assistant

## 2023-09-10 DIAGNOSIS — G4700 Insomnia, unspecified: Secondary | ICD-10-CM

## 2023-09-10 DIAGNOSIS — F411 Generalized anxiety disorder: Secondary | ICD-10-CM

## 2023-09-10 DIAGNOSIS — F319 Bipolar disorder, unspecified: Secondary | ICD-10-CM

## 2023-09-10 MED ORDER — DIAZEPAM 10 MG PO TABS: 5.0000 mg | ORAL_TABLET | Freq: Three times a day (TID) | ORAL | 2 refills | Status: DC | PRN

## 2023-09-10 NOTE — Progress Notes (Signed)
[facility]  Patient ID: Margaret Mckenzie,  MRN: [id number]  PCP: Philip Aspen, Limmie Patricia, MD  Date of Evaluation: 09/10/2023 Time spent:20 minutes  Chief Complaint:  Chief Complaint   Anxiety; Depression; Follow-up    HISTORY/CURRENT STATUS: HPI  For routine med check  At her visit last month she was much more anxious, the Klonopin was not effective anymore.  It did not work quickly or last long enough to make much of a difference.  Since changing to Valium she has been much better.  We also increased BuSpar at that visit and she feels like it is helping to prevent the anxiety.  She is taking the Valium usually twice daily.  Sometimes she needs it 3 times daily though.  She is not drinking any alcohol at all.  Patient is able to enjoy things.  Energy and motivation are good.  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  Some days it can be extremely busy as has been today and she is very tired.  But overall things are going well.  No extreme sadness, tearfulness, or feelings of hopelessness.  Sleeps well.  Needs the trazodone almost every night.  ADLs and personal hygiene are normal.   Denies any changes in concentration, making decisions, or remembering things.  Appetite has not changed.  Weight is stable.  Denies suicidal or homicidal thoughts.  Patient denies increased energy with decreased need for sleep, increased talkativeness, racing thoughts, impulsivity or risky behaviors, increased spending, increased libido, grandiosity, increased irritability or anger, paranoia, or hallucinations.  Denies dizziness, syncope, seizures, numbness, tingling, tremor, tics, unsteady gait, slurred speech, confusion. Denies muscle or joint pain, stiffness, or dystonia. Denies unexplained weight loss, frequent infections, or sores that heal slowly.  No polyphagia, polydipsia, or polyuria. Denies visual changes or paresthesias.   Individual  Medical History/ Review of Systems: Changes? :No    Past medications for mental health diagnoses include: Depakote, Lamictal, Prozac, Lexapro, Xanax, Sonata caused amnesia, Risperdal, Trazodone ineffective at low doses but caused dizziness at higher doses.  Librium taper for detox, toward the end of the treatment she had issues with balance.  Acamprosate, questionably caused imbalance.  Allergies: Nefazodone and Trazodone  Current Medications:  Current Outpatient Medications:    acamprosate (CAMPRAL) 333 MG tablet, Take 2 tablets (666 mg total) by mouth 3 (three) times daily with meals., Disp: 180 tablet, Rfl: 1   acamprosate (CAMPRAL) 333 MG tablet, Take 666 mg by mouth 3 (three) times daily., Disp: , Rfl:    anastrozole (ARIMIDEX) 1 MG tablet, Take 1 tablet (1 mg total) by mouth daily., Disp: 90 tablet, Rfl: 1   anastrozole (ARIMIDEX) 1 MG tablet, Take 1 mg by mouth daily., Disp: , Rfl:    apixaban (ELIQUIS) 5 MG TABS tablet, Take 1 tablet (5 mg total) by mouth 2 (two) times daily., Disp: 28 tablet, Rfl: 0   Ascorbic Acid (VITAMIN C) 1000 MG tablet, Take 1,000 mg by mouth at bedtime., Disp: , Rfl:    busPIRone (BUSPAR) 15 MG tablet, Take 1 tablet (15 mg total) by mouth 3 (three) times daily., Disp: 270 tablet, Rfl: 1   Calcium Carbonate (CALCIUM 500 PO), Take 1 tablet by mouth at bedtime., Disp: , Rfl:    Cholecalciferol (VITAMIN D-3) 25 MCG (1000 UT) CAPS, Take 4,000 Units by mouth at bedtime., Disp: , Rfl:    cyanocobalamin (VITAMIN B12) 1000 MCG/ML injection, INJECT 1 ML (1,000 MCG) INTRAMUSCULARLY  EVERY 30 DAYS, Disp: 9 mL, Rfl: 1   DILT-XR 180 MG 24 hr capsule, TAKE 1 CAPSULE BY MOUTH EVERY DAY, Disp: 90 capsule, Rfl: 3   diltiazem (CARDIZEM) 30 MG tablet, Take 1 tablet every 4 hours AS NEEDED for AFIB heart rate >100, Disp: 45 tablet, Rfl: 1   lamoTRIgine (LAMICTAL) 150 MG tablet, Take 2 tablets (300 mg total) by mouth at bedtime., Disp: 180 tablet, Rfl: 1   lisinopril (ZESTRIL) 20 MG  tablet, TAKE 1 TABLET BY MOUTH EVERY DAY, Disp: 90 tablet, Rfl: 1   Magnesium 400 MG CAPS, Take 400 mg by mouth at bedtime., Disp: , Rfl:    risperiDONE (RISPERDAL) 3 MG tablet, TAKE 1 TABLET BY MOUTH AT BEDTIME., Disp: 90 tablet, Rfl: 1   thiamine (VITAMIN B1) 100 MG tablet, Take 100 mg by mouth daily., Disp: , Rfl:    traMADol (ULTRAM) 50 MG tablet, Take 50 mg by mouth every 6 (six) hours as needed., Disp: , Rfl:    traZODone (DESYREL) 50 MG tablet, Take 1-2 tablets (50-100 mg total) by mouth at bedtime as needed. for sleep, Disp: 180 tablet, Rfl: 1   diazepam (VALIUM) 10 MG tablet, Take 0.5-1 tablets (5-10 mg total) by mouth every 8 (eight) hours as needed for anxiety., Disp: 90 tablet, Rfl: 2   HYDROcodone-acetaminophen (NORCO) 5-325 MG tablet, Take 1 tablet by mouth every 6 (six) hours as needed for moderate pain. (Patient not taking: Reported on 09/10/2023), Disp: 30 tablet, Rfl: 0 Medication Side Effects: none  Family Medical/ Social History: Changes?  No  MENTAL HEALTH EXAM:  There were no vitals taken for this visit.There is no height or weight on file to calculate BMI.  General Appearance: Casual, Neat, and Well Groomed  Eye Contact:  Good  Speech:  Clear and Coherent and Normal Rate  Volume:  Normal  Mood:  Euthymic  Affect:  Congruent  Thought Process:  Goal Directed and Descriptions of Associations: Circumstantial  Orientation:  Full (Time, Place, and Person)  Thought Content: Logical   Suicidal Thoughts:  No  Homicidal Thoughts:  No  Memory:  WNL  Judgement:  Good  Insight:  Good  Psychomotor Activity:  Normal  Concentration:  Concentration: Good and Attention Span: Good  Recall:  Good  Fund of Knowledge: Good  Language: Good  Assets:  Desire for Improvement Financial Resources/Insurance Housing Transportation Vocational/Educational  ADL's:  Intact  Cognition: WNL  Prognosis:  Good   DIAGNOSES:    ICD-10-CM   1. Generalized anxiety disorder  F41.1     2.  Bipolar I disorder (HCC)  F31.9     3. Insomnia, unspecified type  G47.00      Receiving Psychotherapy: Yes   Matt Schooler  RECOMMENDATIONS:  PDMP was reviewed.  Last Klonopin filled 07/18/2023.  Valium filled 08/10/2023. I provided 20 minutes of face to face time during this encounter, including time spent before and after the visit in records review, medical decision making, counseling pertinent to today's visit, and charting.   I am glad to see her doing better.  No changes need to be made.  Continue acamprosate 333 mg, 2 p.o. 3 times daily. Continue BuSpar 15 mg to 1 p.o. 3 times daily. Continue Valium 10 mg, 1/2-1 p.o. 3 times daily as needed anxiety.   Continue Lamictal 150 mg, 2 p.o. nightly. Continue Risperdal 3 mg, 1 p.o. nightly. Continue trazodone 50 mg, 1/2-2 p.o. nightly as needed sleep. Continue vitamins as per med list.  Continue therapy with Georgiann Mohs.  Continue AA or [facility]. Return in 3 months.  Melony Overly, PA-C

## 2023-09-12 ENCOUNTER — Ambulatory Visit: Payer: PPO | Admitting: Physician Assistant

## 2023-09-19 ENCOUNTER — Ambulatory Visit
Admission: RE | Admit: 2023-09-19 | Discharge: 2023-09-19 | Disposition: A | Discharge status: Home / Self Care | Payer: PPO | Source: Ambulatory Visit | Attending: Adult Health | Admitting: Adult Health

## 2023-09-19 DIAGNOSIS — N6489 Other specified disorders of breast: Secondary | ICD-10-CM | POA: Diagnosis not present

## 2023-09-19 DIAGNOSIS — Z170 Estrogen receptor positive status [ER+]: Secondary | ICD-10-CM

## 2023-09-19 HISTORY — DX: Personal history of irradiation: Z92.3

## 2023-09-20 ENCOUNTER — Encounter: Payer: Self-pay | Admitting: Hematology

## 2023-09-29 ENCOUNTER — Encounter: Payer: Self-pay | Admitting: Hematology

## 2023-10-01 ENCOUNTER — Other Ambulatory Visit: Payer: Self-pay

## 2023-10-01 MED ORDER — ANASTROZOLE 1 MG PO TABS: 1.0000 mg | ORAL_TABLET | Freq: Every day | ORAL | 1 refills | Status: DC

## 2023-10-02 DIAGNOSIS — M5126 Other intervertebral disc displacement, lumbar region: Secondary | ICD-10-CM | POA: Diagnosis not present

## 2023-10-21 ENCOUNTER — Other Ambulatory Visit: Payer: Self-pay | Admitting: Cardiology

## 2023-12-04 DIAGNOSIS — M5416 Radiculopathy, lumbar region: Secondary | ICD-10-CM | POA: Diagnosis not present

## 2023-12-11 ENCOUNTER — Encounter: Payer: Self-pay | Admitting: Physician Assistant

## 2023-12-11 ENCOUNTER — Telehealth: Payer: PPO | Admitting: Physician Assistant

## 2023-12-11 DIAGNOSIS — Z87898 Personal history of other specified conditions: Secondary | ICD-10-CM | POA: Diagnosis not present

## 2023-12-11 DIAGNOSIS — F411 Generalized anxiety disorder: Secondary | ICD-10-CM

## 2023-12-11 DIAGNOSIS — F319 Bipolar disorder, unspecified: Secondary | ICD-10-CM | POA: Diagnosis not present

## 2023-12-11 DIAGNOSIS — G4700 Insomnia, unspecified: Secondary | ICD-10-CM | POA: Diagnosis not present

## 2023-12-11 NOTE — Progress Notes (Signed)
 [facility]  Patient ID: Margaret Mckenzie,  MRN: [id number]  PCP: Theophilus Andrews, Tully GRADE, MD  Date of Evaluation: 12/11/2023 Time spent:20 minutes  Chief Complaint:  Chief Complaint   Anxiety; Depression; Insomnia; Follow-up   Virtual Visit via Telehealth  I connected with patient by a video enabled telemedicine application with their informed consent, and verified patient privacy and that I am speaking with the correct person using two identifiers.  I am private, in my office and the patient is at home.  I discussed the limitations, risks, security and privacy concerns of performing an evaluation and management service by video and the availability of in person appointments. I also discussed with the patient that there may be a patient responsible charge related to this service. The patient expressed understanding and agreed to proceed.   I discussed the assessment and treatment plan with the patient. The patient was provided an opportunity to ask questions and all were answered. The patient agreed with the plan and demonstrated an understanding of the instructions.   The patient was advised to call back or seek an in-person evaluation if the symptoms worsen or if the condition fails to improve as anticipated.  I provided 20  minutes of non-face-to-face time during this encounter.  HISTORY/CURRENT STATUS: HPI  For routine med check  Doing well as far as her mental health goes.  Patient is able to enjoy things.  Energy and motivation are good.  Work is going well.  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.  No extreme sadness, tearfulness, or feelings of hopelessness.  Sleeps well most of the time.  Takes trazodone  sometimes.  ADLs and personal hygiene are normal.   Denies any changes in concentration, making decisions, or remembering things.  Appetite has not changed.  Weight is stable.  She does have anxiety and takes the Valium  usually twice a day but rarely more  than that.  Not having panic attacks but she can get super anxious and overwhelmed if she does not take the Valium .  Denies suicidal or homicidal thoughts.  She denies alcohol use in quite a while now.  She is not taking the acamprosate  now.  Does not feel that she needs it.  Patient denies increased energy with decreased need for sleep, increased talkativeness, racing thoughts, impulsivity or risky behaviors, increased spending, increased libido, grandiosity, increased irritability or anger, paranoia, or hallucinations.  Denies dizziness, syncope, seizures, numbness, tingling, tremor, tics, unsteady gait, slurred speech, confusion. Denies muscle or joint pain, stiffness, or dystonia. Denies unexplained weight loss, frequent infections, or sores that heal slowly.  No polyphagia, polydipsia, or polyuria. Denies visual changes or paresthesias.   Individual Medical History/ Review of Systems: Changes? :Yes  Has a stomach bug right now, having diarrhea  Past medications for mental health diagnoses include: Depakote, Lamictal , Prozac, Lexapro, Xanax , Sonata caused amnesia, Risperdal , Trazodone  ineffective at low doses but caused dizziness at higher doses.  Librium  taper for detox, toward the end of the treatment she had issues with balance.  Acamprosate , questionably caused imbalance.  Allergies: Nefazodone and Trazodone   Current Medications:  Current Outpatient Medications:    anastrozole  (ARIMIDEX ) 1 MG tablet, Take 1 mg by mouth daily., Disp: , Rfl:    anastrozole  (ARIMIDEX ) 1 MG tablet, Take 1 tablet (1 mg total) by mouth daily., Disp: 90 tablet, Rfl: 1   apixaban  (ELIQUIS ) 5 MG TABS tablet, Take 1 tablet (5 mg total) by mouth 2 (two) times daily., Disp: 28 tablet,  Rfl: 0   Ascorbic Acid  (VITAMIN C) 1000 MG tablet, Take 1,000 mg by mouth at bedtime., Disp: , Rfl:    busPIRone  (BUSPAR ) 15 MG tablet, Take 1 tablet (15 mg total) by mouth 3 (three) times daily., Disp: 270 tablet, Rfl: 1   Calcium   Carbonate (CALCIUM  500 PO), Take 1 tablet by mouth at bedtime., Disp: , Rfl:    Cholecalciferol  (VITAMIN D -3) 25 MCG (1000 UT) CAPS, Take 4,000 Units by mouth at bedtime., Disp: , Rfl:    cyanocobalamin  (VITAMIN B12) 1000 MCG/ML injection, INJECT 1 ML (1,000 MCG) INTRAMUSCULARLY EVERY 30 DAYS, Disp: 9 mL, Rfl: 1   diazepam  (VALIUM ) 10 MG tablet, Take 0.5-1 tablets (5-10 mg total) by mouth every 8 (eight) hours as needed for anxiety., Disp: 90 tablet, Rfl: 2   diltiazem  (CARDIZEM ) 30 MG tablet, Take 1 tablet every 4 hours AS NEEDED for AFIB heart rate >100, Disp: 45 tablet, Rfl: 1   diltiazem  (DILT-XR) 180 MG 24 hr capsule, TAKE 1 CAPSULE BY MOUTH EVERY DAY, Disp: 90 capsule, Rfl: 2   lamoTRIgine  (LAMICTAL ) 150 MG tablet, Take 2 tablets (300 mg total) by mouth at bedtime., Disp: 180 tablet, Rfl: 1   lisinopril  (ZESTRIL ) 20 MG tablet, TAKE 1 TABLET BY MOUTH EVERY DAY, Disp: 90 tablet, Rfl: 1   Magnesium 400 MG CAPS, Take 400 mg by mouth at bedtime., Disp: , Rfl:    risperiDONE  (RISPERDAL ) 3 MG tablet, TAKE 1 TABLET BY MOUTH AT BEDTIME., Disp: 90 tablet, Rfl: 1   thiamine  (VITAMIN B1) 100 MG tablet, Take 100 mg by mouth daily., Disp: , Rfl:    traMADol  (ULTRAM ) 50 MG tablet, Take 50 mg by mouth every 6 (six) hours as needed., Disp: , Rfl:    traZODone  (DESYREL ) 50 MG tablet, Take 1-2 tablets (50-100 mg total) by mouth at bedtime as needed. for sleep, Disp: 180 tablet, Rfl: 1   acamprosate  (CAMPRAL ) 333 MG tablet, Take 2 tablets (666 mg total) by mouth 3 (three) times daily with meals. (Patient not taking: Reported on 12/11/2023), Disp: 180 tablet, Rfl: 1   acamprosate  (CAMPRAL ) 333 MG tablet, Take 666 mg by mouth 3 (three) times daily. (Patient not taking: Reported on 12/11/2023), Disp: , Rfl:    HYDROcodone -acetaminophen  (NORCO) 5-325 MG tablet, Take 1 tablet by mouth every 6 (six) hours as needed for moderate pain. (Patient not taking: Reported on 12/11/2023), Disp: 30 tablet, Rfl: 0 Medication Side  Effects: none  Family Medical/ Social History: Changes?  No  MENTAL HEALTH EXAM:  There were no vitals taken for this visit.There is no height or weight on file to calculate BMI.  General Appearance: Casual, Neat, and Well Groomed  Eye Contact:  Good  Speech:  Clear and Coherent and Normal Rate  Volume:  Normal  Mood:  Euthymic  Affect:  Congruent  Thought Process:  Goal Directed and Descriptions of Associations: Circumstantial  Orientation:  Full (Time, Place, and Person)  Thought Content: Logical   Suicidal Thoughts:  No  Homicidal Thoughts:  No  Memory:  WNL  Judgement:  Good  Insight:  Good  Psychomotor Activity:  Normal  Concentration:  Concentration: Good and Attention Span: Good  Recall:  Good  Fund of Knowledge: Good  Language: Good  Assets:  Desire for Improvement Financial Resources/Insurance Housing Resilience Transportation Vocational/Educational  ADL's:  Intact  Cognition: WNL  Prognosis:  Good   DIAGNOSES:    ICD-10-CM   1. Bipolar I disorder (HCC)  F31.9  2. Insomnia, unspecified type  G47.00     3. Generalized anxiety disorder  F41.1     4. History of alcohol use disorder  Z87.898       Receiving Psychotherapy: Yes   Matt Schooler  RECOMMENDATIONS:  PDMP was reviewed.  Last Klonopin  filled 07/18/2023.  Valium  filled 11/22/2023 I provided 20 minutes of non-face-to-face time during this encounter, including time spent before and after the visit in records review, medical decision making, counseling pertinent to today's visit, and charting.   She is doing well so no changes need to be made.  Continue BuSpar  15 mg to 1 p.o. 3 times daily. Continue Valium  10 mg, 1/2-1 p.o. 3 times daily as needed anxiety.   Continue Lamictal  150 mg, 2 p.o. nightly. Continue Risperdal  3 mg, 1 p.o. nightly. Continue trazodone  50 mg, 1/2-2 p.o. nightly as needed sleep. Continue vitamins as per med list.    Continue therapy with Adina Sol.  Return in 6  months.  Verneita Cooks, PA-C

## 2023-12-17 ENCOUNTER — Encounter: Payer: Self-pay | Admitting: Internal Medicine

## 2023-12-17 ENCOUNTER — Encounter: Payer: Self-pay | Admitting: Hematology

## 2023-12-17 DIAGNOSIS — I10 Essential (primary) hypertension: Secondary | ICD-10-CM

## 2023-12-17 MED ORDER — LISINOPRIL 20 MG PO TABS: 20.0000 mg | ORAL_TABLET | Freq: Every day | ORAL | 1 refills | Status: DC

## 2023-12-17 NOTE — Telephone Encounter (Signed)
Hey. I have room to see her tomorrow. I am happy to do that. I see that I responded to message, then nothing after that. Thank you!

## 2023-12-19 ENCOUNTER — Ambulatory Visit: Payer: PPO | Admitting: Internal Medicine

## 2023-12-20 NOTE — Progress Notes (Signed)
Patient Care Team: Philip Aspen, Limmie Patricia, MD as PCP - General (Internal Medicine) Rollene Rotunda, MD as PCP - Cardiology (Cardiology) Group, [facility]) Malachy Mood, MD as Consulting Physician (Hematology) Harriette Bouillon, MD as Consulting Physician (General Surgery) Dorothy Puffer, MD as Consulting Physician (Radiation Oncology) Sherrill Raring, [facility] (Pharmacist)  Clinic Day:  12/21/2023  Referring physician: Philip Aspen, Estel*  ASSESSMENT & PLAN:   Assessment & Plan: Malignant neoplasm of upper-outer quadrant of left breast in female, estrogen receptor positive (HCC) -pT1b,cN0M0, G1, ER+/PR+/HER2- -Found on screening mammogram, diagnosed in October 2023 -She underwent left breast lumpectomy, sentinel lymph nodes was not obtained due to the early stage disease and her age. -I discussed her surgical pathology findings, which is very favorable. -She completed adjuvant radiation -She started adjuvant anastrozole in February 2024 -Reports swelling and redness of the left breast since August 2024.  Diagnostic mammogram done October 2024 was benign.  Symptoms have persisted without change. -12/21/2023 -diagnostic mammogram and ultrasound of the left breast ordered for further evaluation. --Start Keflex 500 mg 3 times daily for next 10 days.    Plan: -Reports swelling and redness of the left breast since August 2024.  Diagnostic mammogram done October 2024 was benign.  Symptoms have persisted without change. -12/21/2023 -diagnostic mammogram and ultrasound of the left breast ordered for further evaluation. --Start Keflex 500 mg 3 times daily for next 10 days. -Keep appointment scheduled for 01/10/2024 with Clayborn Heron, NP for labs and follow-up.  The patient understands the plans discussed today and is in agreement with them.  She knows to contact our office if she develops concerns prior to her next appointment.  I provided 25 minutes of face-to-face time  during this encounter and > 50% was spent counseling as documented under my assessment and plan.    Carlean Jews, NP  [facility] - A DEPT OF [facility] [address] [place] [address] Dept: [phone] Dept Fax: [phone]   Orders Placed This Encounter  Procedures   MM DIAG BREAST TOMO UNI LEFT    Standing Status:   Future    Expected Date:   12/22/2023    Expiration Date:   12/20/2024    Reason for Exam (SYMPTOM  OR DIAGNOSIS REQUIRED):   left breast redness, firmness, and warmth. history breast cancer on left side.    Preferred imaging location[facility]   [place] LIMITED ULTRASOUND INCLUDING AXILLA LEFT BREAST     Standing Status:   Future    Expected Date:   12/22/2023    Expiration Date:   12/20/2024    Reason for Exam (SYMPTOM  OR DIAGNOSIS REQUIRED):   left breast redness, warmth, swelling, and firmness. history of breast cancer on left side.    Preferred imaging location?:   [facility]      CHIEF COMPLAINT:  CC: Breast cancer of upper outer quadrant of left breast.  Estrogen receptor positive  Current Treatment: Anastrozole starting 01/04/2023  INTERVAL HISTORY:  Margaret Mckenzie is here today for repeat clinical assessment.  She was last seen by Dr. Mosetta Putt on 07/05/2023.  Reported breast was swollen and red at that visit. Had mammogram in October 2024.  Diagnostic mammogram was benign.  She reports no changes since August 2024.  States that there is no tenderness of the breast  She denies fevers or chills. She denies pain. Her appetite is good. Her weight has increased  8 pounds over last 5 months .  I have reviewed the past medical history, past surgical history, social history and family history with the patient and they are unchanged from previous note.  ALLERGIES:  is allergic to nefazodone and trazodone.  MEDICATIONS:  Current Outpatient Medications  Medication Sig Dispense Refill    acamprosate (CAMPRAL) 333 MG tablet Take 2 tablets (666 mg total) by mouth 3 (three) times daily with meals. 180 tablet 1   acamprosate (CAMPRAL) 333 MG tablet Take 666 mg by mouth 3 (three) times daily.     anastrozole (ARIMIDEX) 1 MG tablet Take 1 mg by mouth daily.     anastrozole (ARIMIDEX) 1 MG tablet Take 1 tablet (1 mg total) by mouth daily. 90 tablet 1   apixaban (ELIQUIS) 5 MG TABS tablet Take 1 tablet (5 mg total) by mouth 2 (two) times daily. 28 tablet 0   Ascorbic Acid (VITAMIN C) 1000 MG tablet Take 1,000 mg by mouth at bedtime.     busPIRone (BUSPAR) 15 MG tablet Take 1 tablet (15 mg total) by mouth 3 (three) times daily. 270 tablet 1   Calcium Carbonate (CALCIUM 500 PO) Take 1 tablet by mouth at bedtime.     cephALEXin (KEFLEX) 500 MG capsule Take 1 capsule (500 mg total) by mouth 3 (three) times daily. 30 capsule 0   Cholecalciferol (VITAMIN D-3) 25 MCG (1000 UT) CAPS Take 4,000 Units by mouth at bedtime.     cyanocobalamin (VITAMIN B12) 1000 MCG/ML injection INJECT 1 ML (1,000 MCG) INTRAMUSCULARLY EVERY 30 DAYS 9 mL 1   diazepam (VALIUM) 10 MG tablet Take 0.5-1 tablets (5-10 mg total) by mouth every 8 (eight) hours as needed for anxiety. 90 tablet 2   diltiazem (CARDIZEM) 30 MG tablet Take 1 tablet every 4 hours AS NEEDED for AFIB heart rate >100 45 tablet 1   diltiazem (DILT-XR) 180 MG 24 hr capsule TAKE 1 CAPSULE BY MOUTH EVERY DAY 90 capsule 2   HYDROcodone-acetaminophen (NORCO) 5-325 MG tablet Take 1 tablet by mouth every 6 (six) hours as needed for moderate pain. 30 tablet 0   lamoTRIgine (LAMICTAL) 150 MG tablet Take 2 tablets (300 mg total) by mouth at bedtime. 180 tablet 1   lisinopril (ZESTRIL) 20 MG tablet Take 1 tablet (20 mg total) by mouth daily. 90 tablet 1   Magnesium 400 MG CAPS Take 400 mg by mouth at bedtime.     risperiDONE (RISPERDAL) 3 MG tablet TAKE 1 TABLET BY MOUTH AT BEDTIME. 90 tablet 1   thiamine (VITAMIN B1) 100 MG tablet Take 100 mg by mouth daily.      traMADol (ULTRAM) 50 MG tablet Take 50 mg by mouth every 6 (six) hours as needed.     traZODone (DESYREL) 50 MG tablet Take 1-2 tablets (50-100 mg total) by mouth at bedtime as needed. for sleep 180 tablet 1   No current facility-administered medications for this visit.    HISTORY OF PRESENT ILLNESS:   Oncology History  Malignant neoplasm of upper-outer quadrant of left breast in female, estrogen receptor positive (HCC)  09/26/2022 Imaging   CLINICAL DATA:  73 year old female presenting as a recall from screening for possible left breast mass.   EXAM: DIGITAL DIAGNOSTIC UNILATERAL LEFT MAMMOGRAM WITH TOMOSYNTHESIS; ULTRASOUND LEFT BREAST LIMITED  IMPRESSION: Suspicious mass measuring 0.9 cm in the left breast at 2 o'clock.   09/29/2022 Cancer Staging   Staging form: Breast, AJCC 8th Edition - Clinical stage from 09/29/2022: Stage IA (cT1b, cN0,  cM0, G2, ER+, PR+, HER2-) - Signed by Malachy Mood, MD on 10/10/2022 Stage prefix: Initial diagnosis Histologic grading system: 3 grade system   09/29/2022 Initial Biopsy   Diagnosis Breast, left, needle core biopsy, 2:00 7 cmfn, ribbon clip - INVASIVE DUCTAL CARCINOMA, SEE NOTE - TUBULE FORMATION: SCORE 3 - NUCLEAR PLEOMORPHISM: SCORE 2 - MITOTIC COUNT: SCORE 1 - TOTAL SCORE: 6 - OVERALL GRADE: 2 - LYMPHOVASCULAR INVASION: NOT IDENTIFIED - CANCER LENGTH: 0.6 CM - CALCIFICATIONS: NOT IDENTIFIED  PROGNOSTIC INDICATORS Results: The tumor cells are negative for Her2 (1+). Estrogen Receptor: 95%, POSITIVE, STRONG STAINING INTENSITY Progesterone Receptor: 100%, POSITIVE, STRONG STAINING INTENSITY Proliferation Marker Ki67: 10%   10/09/2022 Initial Diagnosis   Malignant neoplasm of upper-outer quadrant of left breast in female, estrogen receptor positive (HCC)   11/02/2022 Cancer Staging   Staging form: Breast, AJCC 8th Edition - Pathologic stage from 11/02/2022: Stage Unknown (pT1b, pNX, cM0, G1, ER+, PR+, HER2-) - Signed by Malachy Mood, MD on 01/04/2023 Stage prefix: Initial diagnosis Histologic grading system: 3 grade system Residual tumor (R): R0 - None   12/12/2022 - 01/09/2023 Radiation Therapy   Site Technique Total Dose (Gy) Dose per Fx (Gy) Completed Fx Beam Energies  Breast, Left: Breast_L 3D 42.56/42.56 2.66 16/16 10XFFF  Breast, Left: Breast_L_Bst 3D 8/8 2 4/4 6X, 10X     12/2022 -  Anti-estrogen oral therapy   Anastrozole       REVIEW OF SYSTEMS:   Constitutional: Denies fevers, chills or abnormal weight loss Eyes: Denies blurriness of vision Ears, nose, mouth, throat, and face: Denies mucositis or sore throat Respiratory: Denies cough, dyspnea or wheezes Cardiovascular: Denies palpitation, chest discomfort or lower extremity swelling Gastrointestinal:  Denies nausea, heartburn or change in bowel habits Skin: Denies abnormal skin rashes Lymphatics: Denies new lymphadenopathy or easy bruising Neurological:Denies numbness, tingling or new weaknesses Behavioral/Psych: Mood is stable, no new changes  Breast - reports redness, firmness, and swelling of the left breast without tenderness.  All other systems were reviewed with the patient and are negative.   VITALS:   Today's Vitals   12/21/23 1232 12/21/23 1238  BP: 125/78   Pulse: 96   Resp: 18   Temp: 98.5 F (36.9 C)   TempSrc: Temporal   SpO2: 97%   Weight: 151 lb 1.6 oz (68.5 kg)   Height: 5\' 4"  (1.626 m)   PainSc:  0-No pain   Body mass index is 25.94 kg/m.   Wt Readings from Last 3 Encounters:  12/21/23 151 lb 1.6 oz (68.5 kg)  08/28/23 143 lb 12.8 oz (65.2 kg)  08/17/23 143 lb 12.8 oz (65.2 kg)    Body mass index is 25.94 kg/m.  Performance status (ECOG): 1 - Symptomatic but completely ambulatory  PHYSICAL EXAM:   GENERAL:alert, no distress and comfortable SKIN: skin color, texture, turgor are normal, no rashes or significant lesions EYES: normal, Conjunctiva are pink and non-injected, sclera clear OROPHARYNX:no exudate,  no erythema and lips, buccal mucosa, and tongue normal  NECK: supple, thyroid normal size, non-tender, without nodularity LYMPH:  no palpable lymphadenopathy in the cervical, axillary or inguinal LUNGS: clear to auscultation and percussion with normal breathing effort HEART: regular rate & rhythm and no murmurs and no lower extremity edema ABDOMEN:abdomen soft, non-tender and normal bowel sounds Musculoskeletal:no cyanosis of digits and no clubbing  NEURO: alert & oriented x 3 with fluent speech, no focal motor/sensory deficits BREAST: The right breast is without lumps or masses.  No nipple inversion or  discharge.  No axillary lymphadenopathy noted on the right.  The entire left breast is firm, red, swollen, and warm to touch.  There is lateral lumpectomy scar which has some noted puckering.  No palpable mass or lump.  Nontender.  No nipple inversion or discharge.  No left axillary lymphadenopathy is noted.  LABORATORY DATA:  I have reviewed the data as listed    Component Value Date/Time   NA 127 (L) 08/28/2023 1217   NA 136 09/08/2019 0916   K 4.1 08/28/2023 1217   CL 91 (L) 08/28/2023 1217   CO2 28 08/28/2023 1217   GLUCOSE 102 (H) 08/28/2023 1217   BUN 8 08/28/2023 1217   BUN 12 09/08/2019 0916   CREATININE 0.63 08/28/2023 1217   CREATININE 0.49 07/05/2023 1324   CALCIUM 10.6 (H) 08/28/2023 1217   PROT 7.4 08/28/2023 1217   ALBUMIN 4.6 08/28/2023 1217   AST 20 08/28/2023 1217   AST 12 (L) 07/05/2023 1324   ALT 14 08/28/2023 1217   ALT 10 07/05/2023 1324   ALKPHOS 69 08/28/2023 1217   BILITOT 1.0 08/28/2023 1217   BILITOT 0.5 07/05/2023 1324   GFRNONAA >60 08/28/2023 1217   GFRNONAA >60 07/05/2023 1324   GFRAA 99 09/08/2019 0916    Lab Results  Component Value Date   WBC 7.1 08/28/2023   NEUTROABS 5.7 08/28/2023   HGB 12.9 08/28/2023   HCT 39.5 08/28/2023   MCV 95.6 08/28/2023   PLT 231 08/28/2023

## 2023-12-20 NOTE — Assessment & Plan Note (Addendum)
-  pT1b,cN0M0, G1, ER+/PR+/HER2- -Found on screening mammogram, diagnosed in October 2023 -She underwent left breast lumpectomy, sentinel lymph nodes was not obtained due to the early stage disease and her age. -I discussed her surgical pathology findings, which is very favorable. -She completed adjuvant radiation -She started adjuvant anastrozole in February 2024 -Reports swelling and redness of the left breast since August 2024.  Diagnostic mammogram done October 2024 was benign.  Symptoms have persisted without change. -12/21/2023 -diagnostic mammogram and ultrasound of the left breast ordered for further evaluation. --Start Keflex 500 mg 3 times daily for next 10 days.

## 2023-12-21 ENCOUNTER — Encounter: Payer: Self-pay | Admitting: Nurse Practitioner

## 2023-12-21 ENCOUNTER — Inpatient Hospital Stay: Payer: PPO | Attending: Nurse Practitioner | Admitting: Nurse Practitioner

## 2023-12-21 VITALS — BP 125/78 | HR 96 | Temp 98.5°F | Resp 18 | Ht 64.0 in | Wt 151.1 lb

## 2023-12-21 DIAGNOSIS — Z1721 Progesterone receptor positive status: Secondary | ICD-10-CM | POA: Insufficient documentation

## 2023-12-21 DIAGNOSIS — Z79899 Other long term (current) drug therapy: Secondary | ICD-10-CM | POA: Diagnosis not present

## 2023-12-21 DIAGNOSIS — Z170 Estrogen receptor positive status [ER+]: Secondary | ICD-10-CM | POA: Diagnosis not present

## 2023-12-21 DIAGNOSIS — C50412 Malignant neoplasm of upper-outer quadrant of left female breast: Secondary | ICD-10-CM | POA: Insufficient documentation

## 2023-12-21 DIAGNOSIS — Z7901 Long term (current) use of anticoagulants: Secondary | ICD-10-CM | POA: Insufficient documentation

## 2023-12-21 DIAGNOSIS — Z923 Personal history of irradiation: Secondary | ICD-10-CM | POA: Insufficient documentation

## 2023-12-21 DIAGNOSIS — Z79811 Long term (current) use of aromatase inhibitors: Secondary | ICD-10-CM | POA: Diagnosis not present

## 2023-12-21 MED ORDER — CEPHALEXIN 500 MG PO CAPS: 500.0000 mg | ORAL_CAPSULE | Freq: Three times a day (TID) | ORAL | 0 refills | Status: DC

## 2023-12-24 ENCOUNTER — Encounter: Payer: PPO | Admitting: Internal Medicine

## 2023-12-24 ENCOUNTER — Encounter: Payer: Self-pay | Admitting: Internal Medicine

## 2023-12-24 NOTE — Progress Notes (Signed)
This encounter was created in error - please disregard.

## 2023-12-25 ENCOUNTER — Ambulatory Visit (INDEPENDENT_AMBULATORY_CARE_PROVIDER_SITE_OTHER): Payer: PPO | Admitting: Family Medicine

## 2023-12-25 DIAGNOSIS — Z0000 Encounter for general adult medical examination without abnormal findings: Secondary | ICD-10-CM

## 2023-12-25 DIAGNOSIS — E2839 Other primary ovarian failure: Secondary | ICD-10-CM

## 2023-12-25 NOTE — Progress Notes (Signed)
PATIENT CHECK-IN and HEALTH RISK ASSESSMENT QUESTIONNAIRE:  -completed by phone/video for upcoming Medicare Preventive Visit   Pre-Visit Check-in: 1)Vitals (height, wt, BP, etc) - record in vitals section for visit on day of visit Request home vitals (wt, BP, etc.) and enter into vitals, THEN update Vital Signs SmartPhrase below at the top of the HPI. See below.  2)Review and Update Medications, Allergies PMH, Surgeries, Social history in Epic 3)Hospitalizations in the last year with date/reason?  no  4)Review and Update Care Team (patient's specialists) in Epic 5) Complete PHQ9 in Epic  6) Complete Fall Screening in Epic 7)Review all Health Maintenance Due and order under PCP if not done.  Medicare Wellness Patient Questionnaire:  Answer theses question about your habits: How often do you have a drink containing alcohol? One glass of wine daily How often do you have six or more drinks on one occasion?never Have you ever smoked? Quit 5-6 years ago - 1ppd for 40 years, now vapes Do you use an illicit drugs?no On average, how many days per week do you engage in moderate to strenuous exercise (like a brisk walk)? Works as a Production assistant, radio at [employer] - gets at least 3 miles in per day at work 5 days per week.  Diet: eats a lot of vegetables and fish  Beverages: water  Answer theses question about your everyday activities: Can you perform most household chores? yes Are you deaf or have significant trouble hearing?no Do you feel that you have a problem with memory?no Do you feel safe at home? yes Last dentist visit? Every 6 months 8. Do you have any difficulty performing your everyday activities?no Are you having any difficulty walking, taking medications on your own, and or difficulty managing daily home needs?no Do you have difficulty walking or climbing stairs?no Do you have difficulty dressing or bathing?no Do you have difficulty doing errands alone such as visiting a doctor's  office or shopping?n Do you currently have any difficulty preparing food and eating?n Do you currently have any difficulty using the toilet?n Do you have any difficulty managing your finances?n Do you have any difficulties with housekeeping of managing your housekeeping?n   Do you have Advanced Directives in place (Living Will, Healthcare Power or Attorney)?  yes   Last eye Exam and location? Goes to [facility], last visit 2 years ago   Do you currently use prescribed or non-prescribed narcotic or opioid pain medications? No - was given norco but did not work so doesn't use  Do you have a history or close family history of breast, ovarian, tubal or peritoneal cancer or a family member with BRCA (breast cancer susceptibility 1 and 2) gene mutations? Has cancer - seeing oncology      ----------------------------------------------------------------------------------------------------------------------------------------------------------------------------------------------------------------------  Because this visit was a virtual/telehealth visit, some criteria may be missing or patient reported. Any vitals not documented were not able to be obtained and vitals that have been documented are patient reported.    MEDICARE ANNUAL PREVENTIVE VISIT WITH PROVIDER: (Welcome to Medicare, initial annual wellness or annual wellness exam)  Virtual Visit via Video Note  I connected with Margaret Mckenzie on 12/25/23 by a video enabled telemedicine application and verified that I am speaking with the correct person using two identifiers.  Location patient: home Location provider:work or home office Persons participating in the virtual visit: patient, provider  Concerns and/or follow up today: Saw her oncologist on Friday for issues in L breast - they thought she had infection and  is on antibiotic and has close follow up.  She also will be having a diagnostic mammogram.    See HM  section in Epic for other details of completed HM.    ROS: negative for report of fevers, unintentional weight loss, vision changes, vision loss, hearing loss or change, chest pain, sob, hemoptysis, melena, hematochezia, hematuria, falls, bleeding or bruising, thoughts of suicide or self harm, memory loss  Patient-completed extensive health risk assessment - reviewed and discussed with the patient: See Health Risk Assessment completed with patient prior to the visit either above or in recent phone note. This was reviewed in detailed with the patient today and appropriate recommendations, orders and referrals were placed as needed per Summary below and patient instructions.   Review of Medical History: -PMH, PSH, Family History and current specialty and care providers reviewed and updated and listed below   Patient Care Team: Philip Aspen, Limmie Patricia, MD as PCP - General (Internal Medicine) Rollene Rotunda, MD as PCP - Cardiology (Cardiology) Group, [facility] (Behavioral Health) Malachy Mood, MD as Consulting Physician (Hematology) Harriette Bouillon, MD as Consulting Physician (General Surgery) Dorothy Puffer, MD as Consulting Physician (Radiation Oncology) Sherrill Raring, [facility] (Pharmacist)   Past Medical History:  Diagnosis Date   Anxiety    Bipolar 1 disorder (HCC)    Chicken pox    Depression    Hypertension    high blood pressure readings    Kidney disease    kidney stones and saw urologist, Dr. Dillard Cannon for lithotripsy remotely    PAF (paroxysmal atrial fibrillation) [facility])    Personal history of radiation therapy     Past Surgical History:  Procedure Laterality Date   BREAST BIOPSY  10/31/2022   MM LT RADIOACTIVE SEED LOC MAMMO GUIDE 10/31/2022 [facility] MAMMOGRAPHY   BREAST LUMPECTOMY Left 11/02/2022   BREAST LUMPECTOMY WITH RADIOACTIVE SEED LOCALIZATION Left 11/02/2022   Procedure: LEFT BREAST LUMPECTOMY WITH RADIOACTIVE SEED LOCALIZATION;  Surgeon: Harriette Bouillon, MD;  Location: [facility];  Service: General;  Laterality: Left;   BUNIONECTOMY  11/27/2005   TONSILLECTOMY AND ADENOIDECTOMY  11/27/1973    Social History   Socioeconomic History   Marital status: Divorced    Spouse name: Not on file   Number of children: 0   Years of education: Not on file   Highest education level: Associate degree: occupational, Scientist, product/process development, or vocational program  Occupational History   Not on file  Tobacco Use   Smoking status: Every Day    Types: E-cigarettes   Smokeless tobacco: Never   Tobacco comments:    pt has quit smoking  Vaping Use   Vaping status: Every Day   Substances: Nicotine, Flavoring  Substance and Sexual Activity   Alcohol use: Not Currently   Drug use: No   Sexual activity: Not on file  Other Topics Concern   Not on file  Social History Narrative   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[employer] Situation: lives alone      Spiritual Beliefs: episcopalian      Lifestyle: had been going to Y but currently dealing with meniscal tear; diet good            Social Drivers of Health   Financial Resource Strain: Low Risk  (01/12/2023)   Overall Financial Resource Strain (CARDIA)    Difficulty of Paying Living Expenses: Not very hard  Food Insecurity: Unknown (01/12/2023)   Hunger Vital Sign  Worried About Programme researcher, broadcasting/film/video in the Last Year: Never true    [employer] of Food in the Last Year: Not on file  Transportation Needs: No Transportation Needs (01/12/2023)   PRAPARE - Administrator, Civil Service (Medical): No    Lack of Transportation (Non-Medical): No  Physical Activity: Sufficiently Active (10/05/2022)   Exercise Vital Sign    Days of Exercise per Week: 4 days    Minutes of Exercise per Session: 150+ min  Stress: Stress Concern Present (10/05/2022)   [employer] of Occupational Health - Occupational Stress Questionnaire    Feeling of Stress : To some extent   Social Connections: Not on file  Intimate Partner Violence: Not on file    Family History  Problem Relation Age of Onset   Osteoporosis Mother    Stroke Mother        TIA, stoke   Mental retardation Mother    Hypertension Father    Heart disease Father 31   Stroke Father 26   Mental illness Sister    Breast cancer Neg Hx     Current Outpatient Medications on File Prior to Visit  Medication Sig Dispense Refill   acamprosate (CAMPRAL) 333 MG tablet Take 2 tablets (666 mg total) by mouth 3 (three) times daily with meals. 180 tablet 1   acamprosate (CAMPRAL) 333 MG tablet Take 666 mg by mouth 3 (three) times daily.     anastrozole (ARIMIDEX) 1 MG tablet Take 1 mg by mouth daily.     anastrozole (ARIMIDEX) 1 MG tablet Take 1 tablet (1 mg total) by mouth daily. 90 tablet 1   apixaban (ELIQUIS) 5 MG TABS tablet Take 1 tablet (5 mg total) by mouth 2 (two) times daily. 28 tablet 0   Ascorbic Acid (VITAMIN C) 1000 MG tablet Take 1,000 mg by mouth at bedtime.     busPIRone (BUSPAR) 15 MG tablet Take 1 tablet (15 mg total) by mouth 3 (three) times daily. 270 tablet 1   Calcium Carbonate (CALCIUM 500 PO) Take 1 tablet by mouth at bedtime.     cephALEXin (KEFLEX) 500 MG capsule Take 1 capsule (500 mg total) by mouth 3 (three) times daily. 30 capsule 0   Cholecalciferol (VITAMIN D-3) 25 MCG (1000 UT) CAPS Take 4,000 Units by mouth at bedtime.     cyanocobalamin (VITAMIN B12) 1000 MCG/ML injection INJECT 1 ML (1,000 MCG) INTRAMUSCULARLY EVERY 30 DAYS 9 mL 1   diazepam (VALIUM) 10 MG tablet Take 0.5-1 tablets (5-10 mg total) by mouth every 8 (eight) hours as needed for anxiety. 90 tablet 2   diltiazem (CARDIZEM) 30 MG tablet Take 1 tablet every 4 hours AS NEEDED for AFIB heart rate >100 45 tablet 1   diltiazem (DILT-XR) 180 MG 24 hr capsule TAKE 1 CAPSULE BY MOUTH EVERY DAY 90 capsule 2   HYDROcodone-acetaminophen (NORCO) 5-325 MG tablet Take 1 tablet by mouth every 6 (six) hours as needed for  moderate pain. 30 tablet 0   lamoTRIgine (LAMICTAL) 150 MG tablet Take 2 tablets (300 mg total) by mouth at bedtime. 180 tablet 1   lisinopril (ZESTRIL) 20 MG tablet Take 1 tablet (20 mg total) by mouth daily. 90 tablet 1   Magnesium 400 MG CAPS Take 400 mg by mouth at bedtime.     risperiDONE (RISPERDAL) 3 MG tablet TAKE 1 TABLET BY MOUTH AT BEDTIME. 90 tablet 1   thiamine (VITAMIN B1) 100 MG tablet Take 100 mg by mouth daily.  traMADol (ULTRAM) 50 MG tablet Take 50 mg by mouth every 6 (six) hours as needed.     traZODone (DESYREL) 50 MG tablet Take 1-2 tablets (50-100 mg total) by mouth at bedtime as needed. for sleep 180 tablet 1   No current facility-administered medications on file prior to visit.    Allergies  Allergen Reactions   Nefazodone Other (See Comments)    Hypersensitivity   Trazodone Other (See Comments)    Trazodone causes lethargy the morning after taking it       Physical Exam Vitals requested from patient and listed below if patient had equipment and was able to obtain at home for this virtual visit: There were no vitals filed for this visit. Estimated body mass index is 26.16 kg/m as calculated from the following:   Height as of 12/21/23: 5\' 4"  (1.626 m).   Weight as of 12/24/23: 152 lb 6.4 oz (69.1 kg).  EKG (optional): deferred due to virtual visit  GENERAL: alert, oriented, no acute distress detected, full vision exam deferred due to pandemic and/or virtual encounter   HEENT: atraumatic, conjunttiva clear, no obvious abnormalities on inspection of external nose and ears  NECK: normal movements of the head and neck  LUNGS: on inspection no signs of respiratory distress, breathing rate appears normal, no obvious gross SOB, gasping or wheezing  CV: no obvious cyanosis  MS: moves all visible extremities without noticeable abnormality  PSYCH/NEURO: pleasant and cooperative, no obvious depression or anxiety, speech and thought processing grossly intact,  Cognitive function grossly intact  Flowsheet Row Office Visit from 06/06/2023 in [facility] [place]  PHQ-9 Total Score 8           12/25/2023   12:26 PM 12/24/2023    2:50 PM 06/28/2023   11:45 AM 06/06/2023    9:57 AM 12/13/2022    1:10 PM  Depression screen PHQ 2/9  Decreased Interest 0 0  2 0  Down, Depressed, Hopeless 0 0  3 0  PHQ - 2 Score 0 0  5 0  Altered sleeping   1 0 0  Tired, decreased energy    3 0  Change in appetite   1 0 0  Feeling bad or failure about yourself    0 0 0  Trouble concentrating   0 0 0  Moving slowly or fidgety/restless   0 0 0  Suicidal thoughts   0 0 0  PHQ-9 Score    8 0  Difficult doing work/chores   Not difficult at all Somewhat difficult Not difficult at all       06/06/2023    9:56 AM 06/14/2023    2:40 PM 06/28/2023   11:45 AM 12/24/2023    2:50 PM 12/25/2023   12:26 PM  Fall Risk  Falls in the past year? 0 0 0 0 0  Was there an injury with Fall? 0 0 0 0 0  Fall Risk Category Calculator 0 0 0 0 0  Patient at Risk for Falls Due to No Fall Risks      Fall risk Follow up Falls evaluation completed Falls evaluation completed Falls evaluation completed Falls evaluation completed Falls evaluation completed;Education provided     SUMMARY AND PLAN:  Encounter for Medicare annual wellness exam  Estrogen deficiency - Plan: DG Bone Density   Discussed applicable health maintenance/preventive health measures and advised and referred or ordered per patient preferences: -she says has number to call to schedule lung cancer screening and  declines me providing -she plans to schedule bone density and asks for another referral as reports last referral was to the wrong place - referral placed, advised to call office if not contacted for scheduling in the next 1-2 weeks -discussed vaccines due and that she can get them at the pharmacy Health Maintenance  Topic Date Due   DTaP/Tdap/Td (2 - Td or Tdap) 06/18/2023   DEXA SCAN   07/23/2023   COVID-19 Vaccine (7 - 2024-25 season) 09/25/2023   MAMMOGRAM  09/18/2024   Medicare Annual Wellness (AWV)  12/24/2024   Colonoscopy  02/22/2028   Pneumonia Vaccine 37+ Years old  Completed   INFLUENZA VACCINE  Completed   Hepatitis C Screening  Completed   Zoster Vaccines- Shingrix  Completed   HPV VACCINES  Aged [employer] and counseling on the following was provided based on the above review of health and a plan/checklist for the patient, along with additional information discussed, was provided for the patient in the patient instructions :   -Advised and counseled on a healthy lifestyle - including the importance of a healthy diet, regular physical activity, -Reviewed patient's current diet. Advised and counseled on a whole foods based healthy diet. A summary of a healthy diet was provided in the Patient Instructions.  -reviewed patient's current physical activity level and discussed exercise guidelines for adults. Discussed community resources and ideas for safe exercise at home to assist in meeting exercise guideline recommendations in a safe and healthy way. Discussed benefits of continued activity level at work.  -Advise yearly dental visits at minimum and regular eye exams -Advised and counseled on alcohol safe limits, risks/ tobacco use, risks of smoking and offered counseling/help - currently in pre contemplation stage  Follow up: see patient instructions     Patient Instructions  I really enjoyed getting to talk with you today! I am available on Tuesdays and Thursdays for virtual visits if you have any questions or concerns, or if I can be of any further assistance.   CHECKLIST FROM ANNUAL WELLNESS VISIT:  -Follow up (please call to schedule if not scheduled after visit):   -yearly for annual wellness visit with primary care office  Here is a list of your preventive care/health maintenance measures and the plan for each if any are due:  PLAN For  any measures below that may be due:  -call to schedule lung cancer screening -please call the office if you have not been contacted about the bone density test in the next 1-2 weeks -can get the vaccines at the pharmacy, please let us know if you do  Health Maintenance  Topic Date Due   DTaP/Tdap/Td (2 - Td or Tdap) 06/18/2023   DEXA SCAN  07/23/2023   COVID-19 Vaccine (7 - 2024-25 season) 09/25/2023   MAMMOGRAM  09/18/2024   Medicare Annual Wellness (AWV)  12/24/2024   Colonoscopy  02/22/2028   Pneumonia Vaccine 3+ Years old  Completed   INFLUENZA VACCINE  Completed   Hepatitis C Screening  Completed   Zoster Vaccines- Shingrix  Completed   HPV VACCINES  Aged Out    -See a dentist at least yearly  -Get your eyes checked and then per your eye specialist's recommendations  -Other issues addressed today:   -I have included below further information regarding a healthy whole foods based diet, physical activity guidelines for adults, stress management and opportunities for social connections. I hope you find this information useful.   -----------------------------------------------------------------------------------------------------------------------------------------------------------------------------------------------------------------------------------------------------------  NUTRITION: -eat real food: lots of colorful vegetables (half the plate) and fruits -5-7 servings of vegetables and fruits per day (fresh or steamed is best), exp. 2 servings of vegetables with lunch and dinner and 2 servings of fruit per day. Berries and greens such as kale and collards are great choices.  -consume on a regular basis: whole grains (make sure first ingredient on label contains the word "whole"), fresh fruits, fish, nuts, seeds, healthy oils (such as olive oil, avocado oil) -may eat small amounts of dairy and lean meat on occasion, but avoid processed meats such as ham, bacon, lunch meat,  etc. -drink water -try to avoid fast food and pre-packaged foods, processed meat -most experts advise limiting sodium to < 2300mg  per day, should limit further is any chronic conditions such as high blood pressure, heart disease, diabetes, etc. The American Heart Association advised that < 1500mg  is is ideal -try to avoid foods that contain any ingredients with names you do not recognize  -try to avoid sugar/sweets (except for the natural sugar that occurs in fresh fruit) -try to avoid sweet drinks -try to avoid white rice, white bread, pasta (unless whole grain), white or yellow potatoes  EXERCISE GUIDELINES FOR ADULTS: -if you wish to increase your physical activity, do so gradually and with the approval of your doctor -STOP and seek medical care immediately if you have any chest pain, chest discomfort or trouble breathing when starting or increasing exercise  -move and stretch your body, legs, feet and arms when sitting for long periods -Physical activity guidelines for optimal health in adults: -least 150 minutes per week of aerobic exercise (can talk, but not sing) once approved by your doctor, 20-30 minutes of sustained activity or two 10 minute episodes of sustained activity every day.  -resistance training at least 2 days per week if approved by your doctor -balance exercises 3+ days per week:   Stand somewhere where you have something sturdy to hold onto if you lose balance.    1) lift up on toes, start with 5x per day and work up to 20x   2) stand and lift on leg straight out to the side so that foot is a few inches of the floor, start with 5x each side and work up to 20x each side   3) stand on one foot, start with 5 seconds each side and work up to 20 seconds on each side  If you need ideas or help with getting more active:  -Silver sneakers https://tools.silversneakers.com  -Walk with a Doc: http://www.duncan-williams.com/  -try to include resistance (weight lifting/strength  building) and balance exercises twice per week: or the following link for ideas: http://castillo-powell.com/  BuyDucts.dk  STRESS MANAGEMENT: -can try meditating, or just sitting quietly with deep breathing while intentionally relaxing all parts of your body for 5 minutes daily -if you need further help with stress, anxiety or depression please follow up with your primary doctor or contact the wonderful folks at [facility]: [phone]  SOCIAL CONNECTIONS: -options in [place] if you wish to engage in more social and exercise related activities:  -Silver sneakers https://tools.silversneakers.com  -Walk with a Doc: http://www.duncan-williams.com/  -Check out the [facility] Active Adults 50+ section on the [employer] (hiking clubs, book clubs, cards and games, chess, exercise classes, aquatic classes and much more) - see the website for details: https://www.[place].gov/departments/parks-recreation/active-adults50  -YouTube has lots of exercise videos for different ages and abilities as well  -[facility] (a variety  of indoor and outdoor inperson activities for adults). (226) 670-7499. [address].  -Virtual Online Classes (a variety of topics): see seniorplanet.org or call [phone]  -consider volunteering at a school, hospice center, church, senior center or elsewhere           Terressa Koyanagi, DO

## 2023-12-25 NOTE — Patient Instructions (Signed)
I really enjoyed getting to talk with you today! I am available on Tuesdays and Thursdays for virtual visits if you have any questions or concerns, or if I can be of any further assistance.   CHECKLIST FROM ANNUAL WELLNESS VISIT:  -Follow up (please call to schedule if not scheduled after visit):   -yearly for annual wellness visit with primary care office  Here is a list of your preventive care/health maintenance measures and the plan for each if any are due:  PLAN For any measures below that may be due:  -call to schedule lung cancer screening -please call the office if you have not been contacted about the bone density test in the next 1-2 weeks -can get the vaccines at the pharmacy, please let us know if you do  Health Maintenance  Topic Date Due   DTaP/Tdap/Td (2 - Td or Tdap) 06/18/2023   DEXA SCAN  07/23/2023   COVID-19 Vaccine (7 - 2024-25 season) 09/25/2023   MAMMOGRAM  09/18/2024   Medicare Annual Wellness (AWV)  12/24/2024   Colonoscopy  02/22/2028   Pneumonia Vaccine 42+ Years old  Completed   INFLUENZA VACCINE  Completed   Hepatitis C Screening  Completed   Zoster Vaccines- Shingrix  Completed   HPV VACCINES  Aged Out    -See a dentist at least yearly  -Get your eyes checked and then per your eye specialist's recommendations  -Other issues addressed today:   -I have included below further information regarding a healthy whole foods based diet, physical activity guidelines for adults, stress management and opportunities for social connections. I hope you find this information useful.   -----------------------------------------------------------------------------------------------------------------------------------------------------------------------------------------------------------------------------------------------------------  NUTRITION: -eat real food: lots of colorful vegetables (half the plate) and fruits -5-7 servings of vegetables and fruits per day  (fresh or steamed is best), exp. 2 servings of vegetables with lunch and dinner and 2 servings of fruit per day. Berries and greens such as kale and collards are great choices.  -consume on a regular basis: whole grains (make sure first ingredient on label contains the word "whole"), fresh fruits, fish, nuts, seeds, healthy oils (such as olive oil, avocado oil) -may eat small amounts of dairy and lean meat on occasion, but avoid processed meats such as ham, bacon, lunch meat, etc. -drink water -try to avoid fast food and pre-packaged foods, processed meat -most experts advise limiting sodium to < 2300mg  per day, should limit further is any chronic conditions such as high blood pressure, heart disease, diabetes, etc. The American Heart Association advised that < 1500mg  is is ideal -try to avoid foods that contain any ingredients with names you do not recognize  -try to avoid sugar/sweets (except for the natural sugar that occurs in fresh fruit) -try to avoid sweet drinks -try to avoid white rice, white bread, pasta (unless whole grain), white or yellow potatoes  EXERCISE GUIDELINES FOR ADULTS: -if you wish to increase your physical activity, do so gradually and with the approval of your doctor -STOP and seek medical care immediately if you have any chest pain, chest discomfort or trouble breathing when starting or increasing exercise  -move and stretch your body, legs, feet and arms when sitting for long periods -Physical activity guidelines for optimal health in adults: -least 150 minutes per week of aerobic exercise (can talk, but not sing) once approved by your doctor, 20-30 minutes of sustained activity or two 10 minute episodes of sustained activity every day.  -resistance training at least 2 days per week  if approved by your doctor -balance exercises 3+ days per week:   Stand somewhere where you have something sturdy to hold onto if you lose balance.    1) lift up on toes, start with 5x per  day and work up to 20x   2) stand and lift on leg straight out to the side so that foot is a few inches of the floor, start with 5x each side and work up to 20x each side   3) stand on one foot, start with 5 seconds each side and work up to 20 seconds on each side  If you need ideas or help with getting more active:  -Silver sneakers https://tools.silversneakers.com  -Walk with a Doc: http://www.duncan-williams.com/  -try to include resistance (weight lifting/strength building) and balance exercises twice per week: or the following link for ideas: http://castillo-powell.com/  BuyDucts.dk  STRESS MANAGEMENT: -can try meditating, or just sitting quietly with deep breathing while intentionally relaxing all parts of your body for 5 minutes daily -if you need further help with stress, anxiety or depression please follow up with your primary doctor or contact the wonderful folks at [facility]: [phone]  SOCIAL CONNECTIONS: -options in [place] if you wish to engage in more social and exercise related activities:  -Silver sneakers https://tools.silversneakers.com  -Walk with a Doc: http://www.duncan-williams.com/  -Check out the [facility] Active Adults 50+ section on the [place] [employer] (hiking clubs, book clubs, cards and games, chess, exercise classes, aquatic classes and much more) - see the website for details: https://www.[place].gov/departments/parks-recreation/active-adults50  -YouTube has lots of exercise videos for different ages and abilities as well  -[facility] (a variety of indoor and outdoor inperson activities for adults). [phone]. [address].  -Virtual Online Classes (a variety of topics): see seniorplanet.org or call [phone]  -consider volunteering at a school, hospice center, church, senior center or  elsewhere

## 2024-01-01 ENCOUNTER — Other Ambulatory Visit (INDEPENDENT_AMBULATORY_CARE_PROVIDER_SITE_OTHER): Payer: PPO | Admitting: Family Medicine

## 2024-01-01 ENCOUNTER — Telehealth: Payer: Self-pay | Admitting: Hematology

## 2024-01-01 DIAGNOSIS — E2839 Other primary ovarian failure: Secondary | ICD-10-CM

## 2024-01-01 NOTE — Progress Notes (Signed)
Bone density order message received saying order expired. Not sure why as just ordered recently. Replaced order.

## 2024-01-01 NOTE — Telephone Encounter (Signed)
Rescheduled appointments per provider request. Left the patient a voicemail with the updated date and times. Patient will be mailed an appointment reminder.

## 2024-01-10 ENCOUNTER — Encounter: Payer: Self-pay | Admitting: Hematology

## 2024-01-10 ENCOUNTER — Ambulatory Visit: Payer: PPO | Admitting: Nurse Practitioner

## 2024-01-10 ENCOUNTER — Other Ambulatory Visit: Payer: PPO

## 2024-01-10 ENCOUNTER — Inpatient Hospital Stay: Payer: PPO | Admitting: Hematology

## 2024-01-10 ENCOUNTER — Inpatient Hospital Stay: Payer: PPO | Attending: Nurse Practitioner

## 2024-01-10 VITALS — BP 123/76 | HR 92 | Temp 97.3°F | Resp 17 | Wt 155.3 lb

## 2024-01-10 DIAGNOSIS — Z170 Estrogen receptor positive status [ER+]: Secondary | ICD-10-CM

## 2024-01-10 DIAGNOSIS — Z79811 Long term (current) use of aromatase inhibitors: Secondary | ICD-10-CM | POA: Insufficient documentation

## 2024-01-10 DIAGNOSIS — Z87442 Personal history of urinary calculi: Secondary | ICD-10-CM | POA: Diagnosis not present

## 2024-01-10 DIAGNOSIS — I480 Paroxysmal atrial fibrillation: Secondary | ICD-10-CM | POA: Insufficient documentation

## 2024-01-10 DIAGNOSIS — I10 Essential (primary) hypertension: Secondary | ICD-10-CM | POA: Diagnosis not present

## 2024-01-10 DIAGNOSIS — Z9089 Acquired absence of other organs: Secondary | ICD-10-CM | POA: Insufficient documentation

## 2024-01-10 DIAGNOSIS — C50412 Malignant neoplasm of upper-outer quadrant of left female breast: Secondary | ICD-10-CM

## 2024-01-10 DIAGNOSIS — F419 Anxiety disorder, unspecified: Secondary | ICD-10-CM | POA: Insufficient documentation

## 2024-01-10 DIAGNOSIS — I890 Lymphedema, not elsewhere classified: Secondary | ICD-10-CM | POA: Diagnosis not present

## 2024-01-10 DIAGNOSIS — Z1721 Progesterone receptor positive status: Secondary | ICD-10-CM | POA: Insufficient documentation

## 2024-01-10 DIAGNOSIS — Z923 Personal history of irradiation: Secondary | ICD-10-CM | POA: Diagnosis not present

## 2024-01-10 DIAGNOSIS — F319 Bipolar disorder, unspecified: Secondary | ICD-10-CM | POA: Insufficient documentation

## 2024-01-10 DIAGNOSIS — Z79899 Other long term (current) drug therapy: Secondary | ICD-10-CM | POA: Diagnosis not present

## 2024-01-10 DIAGNOSIS — Z1732 Human epidermal growth factor receptor 2 negative status: Secondary | ICD-10-CM | POA: Diagnosis not present

## 2024-01-10 DIAGNOSIS — Z7901 Long term (current) use of anticoagulants: Secondary | ICD-10-CM | POA: Diagnosis not present

## 2024-01-10 LAB — CBC WITH DIFFERENTIAL (CANCER CENTER ONLY)
Abs Immature Granulocytes: 0.04 10*3/uL (ref 0.00–0.07)
Basophils Absolute: 0 10*3/uL (ref 0.0–0.1)
Basophils Relative: 1 %
Eosinophils Absolute: 0.1 10*3/uL (ref 0.0–0.5)
Eosinophils Relative: 2 %
HCT: 39.4 % (ref 36.0–46.0)
Hemoglobin: 13.3 g/dL (ref 12.0–15.0)
Immature Granulocytes: 1 %
Lymphocytes Relative: 18 %
Lymphs Abs: 1 10*3/uL (ref 0.7–4.0)
MCH: 33.2 pg (ref 26.0–34.0)
MCHC: 33.8 g/dL (ref 30.0–36.0)
MCV: 98.3 fL (ref 80.0–100.0)
Monocytes Absolute: 0.7 10*3/uL (ref 0.1–1.0)
Monocytes Relative: 13 %
Neutro Abs: 3.7 10*3/uL (ref 1.7–7.7)
Neutrophils Relative %: 65 %
Platelet Count: 248 10*3/uL (ref 150–400)
RBC: 4.01 MIL/uL (ref 3.87–5.11)
RDW: 12.9 % (ref 11.5–15.5)
WBC Count: 5.5 10*3/uL (ref 4.0–10.5)
nRBC: 0 % (ref 0.0–0.2)

## 2024-01-10 LAB — CMP (CANCER CENTER ONLY)
ALT: 15 U/L (ref 0–44)
AST: 22 U/L (ref 15–41)
Albumin: 4.4 g/dL (ref 3.5–5.0)
Alkaline Phosphatase: 79 U/L (ref 38–126)
Anion gap: 7 (ref 5–15)
BUN: 8 mg/dL (ref 8–23)
CO2: 31 mmol/L (ref 22–32)
Calcium: 10.3 mg/dL (ref 8.9–10.3)
Chloride: 90 mmol/L — ABNORMAL LOW (ref 98–111)
Creatinine: 0.63 mg/dL (ref 0.44–1.00)
GFR, Estimated: >60 mL/min (ref >60)
Glucose, Bld: 91 mg/dL (ref 70–99)
Potassium: 5.2 mmol/L — ABNORMAL HIGH (ref 3.5–5.1)
Sodium: 128 mmol/L — ABNORMAL LOW (ref 135–145)
Total Bilirubin: 0.4 mg/dL (ref 0.0–1.2)
Total Protein: 7 g/dL (ref 6.5–8.1)

## 2024-01-10 NOTE — Assessment & Plan Note (Signed)
-  pT1b,cN0M0, G1, ER+/PR+/HER2- -Found on screening mammogram, diagnosed in October 2023 -She underwent left breast lumpectomy, sentinel lymph nodes was not obtained due to the early stage disease and her age. -I discussed her surgical pathology findings, which is very favorable. -She completed adjuvant radiation -She started adjuvant anastrozole in February 2024

## 2024-01-10 NOTE — Progress Notes (Signed)
[facility]   [phone] [phone]   Clinic Follow up Note   Patient Care Team: Philip Aspen, Limmie Patricia, MD as PCP - General (Internal Medicine) Rollene Rotunda, MD as PCP - Cardiology (Cardiology) Group, [facility]) Malachy Mood, MD as Consulting Physician (Hematology) Harriette Bouillon, MD as Consulting Physician (General Surgery) Dorothy Puffer, MD as Consulting Physician (Radiation Oncology) Sherrill Raring, [facility] (Pharmacist)  Date of Service:  01/10/2024  CHIEF COMPLAINT: f/u of breast cancer  CURRENT THERAPY:  Anastrozole 1 mg daily  Oncology History   Malignant neoplasm of upper-outer quadrant of left breast in female, estrogen receptor positive (HCC) -pT1b,cN0M0, G1, ER+/PR+/HER2- -Found on screening mammogram, diagnosed in October 2023 -She underwent left breast lumpectomy, sentinel lymph nodes was not obtained due to the early stage disease and her age. -I discussed her surgical pathology findings, which is very favorable. -She completed adjuvant radiation -She started adjuvant anastrozole in February 2024    Assessment and Plan    Breast Cancer with Lymphedema 73 year old female with breast cancer diagnosed in 2023, status post-surgery and radiation therapy. Presenting with persistent swelling and redness in the left breast for a few months. Examination reveals firmness and warmth, consistent with lymphedema. No pain, fever, or new lumps noted. Mammogram from October 2024 was normal. Radiation and tissue removal likely contributed to the condition. Discussed benefits of physical therapy and breast massage for lymphedema management. - Refer to physical therapy for lymphedema management - Advise breast massage - Take a photo of the affected breast for the physical therapist - Discontinue antibiotics  Breast Cancer Follow-Up Follow-up for breast cancer diagnosed in 2023. Currently on anastrozole,  tolerating well without significant side effects. Last mammogram in October 2024 was normal. Bone density scan from August 2022 was normal, but a repeat scan is due. Discussed importance of regular mammograms and bone density scans, especially given potential bone-weakening effects of anastrozole. - Order mammogram for October 2025 - Schedule follow-up in six months - Continue anastrozole - Order bone density scan  Atrial Fibrillation Atrial fibrillation managed with Eliquis. - Continue Eliquis  Bipolar Disorder Bipolar disorder managed with Lamictal and Risperdal. Under psychiatric care with good symptom control. - Continue Lamictal and Risperdal  General Health Maintenance Active, takes calcium and vitamin D supplements. - Continue calcium and vitamin D supplementation - Encourage regular exercise and walking  Plan -PT referral for left breast lymphedema -Continue anastrozole - Follow up in six months         SUMMARY OF ONCOLOGIC HISTORY: Oncology History  Malignant neoplasm of upper-outer quadrant of left breast in female, estrogen receptor positive (HCC)  09/26/2022 Imaging   CLINICAL DATA:  73 year old female presenting as a recall from screening for possible left breast mass.   EXAM: DIGITAL DIAGNOSTIC UNILATERAL LEFT MAMMOGRAM WITH TOMOSYNTHESIS; ULTRASOUND LEFT BREAST LIMITED  IMPRESSION: Suspicious mass measuring 0.9 cm in the left breast at 2 o'clock.   09/29/2022 Cancer Staging   Staging form: Breast, AJCC 8th Edition - Clinical stage from 09/29/2022: Stage IA (cT1b, cN0, cM0, G2, ER+, PR+, HER2-) - Signed by Malachy Mood, MD on 10/10/2022 Stage prefix: Initial diagnosis Histologic grading system: 3 grade system   09/29/2022 Initial Biopsy   Diagnosis Breast, left, needle core biopsy, 2:00 7 cmfn, ribbon clip - INVASIVE DUCTAL CARCINOMA, SEE NOTE - TUBULE FORMATION: SCORE 3 - NUCLEAR PLEOMORPHISM: SCORE 2 - MITOTIC COUNT: SCORE 1 - TOTAL SCORE: 6 -  OVERALL GRADE: 2 - LYMPHOVASCULAR INVASION: NOT IDENTIFIED -  CANCER LENGTH: 0.6 CM - CALCIFICATIONS: NOT IDENTIFIED  PROGNOSTIC INDICATORS Results: The tumor cells are negative for Her2 (1+). Estrogen Receptor: 95%, POSITIVE, STRONG STAINING INTENSITY Progesterone Receptor: 100%, POSITIVE, STRONG STAINING INTENSITY Proliferation Marker Ki67: 10%   10/09/2022 Initial Diagnosis   Malignant neoplasm of upper-outer quadrant of left breast in female, estrogen receptor positive (HCC)   11/02/2022 Cancer Staging   Staging form: Breast, AJCC 8th Edition - Pathologic stage from 11/02/2022: Stage Unknown (pT1b, pNX, cM0, G1, ER+, PR+, HER2-) - Signed by Malachy Mood, MD on 01/04/2023 Stage prefix: Initial diagnosis Histologic grading system: 3 grade system Residual tumor (R): R0 - None   12/12/2022 - 01/09/2023 Radiation Therapy   Site Technique Total Dose (Gy) Dose per Fx (Gy) Completed Fx Beam Energies  Breast, Left: Breast_L 3D 42.56/42.56 2.66 16/16 10XFFF  Breast, Left: Breast_L_Bst 3D 8/8 2 4/4 6X, 10X     12/2022 -  Anti-estrogen oral therapy   Anastrozole      Discussed the use of AI scribe software for clinical note transcription with the patient, who gave verbal consent to proceed.  History of Present Illness   The patient, a 73 year old female with a history of breast cancer, presents for follow-up. She reports persistent swelling in the same breast where she previously had surgery for breast cancer. The swelling has been present for a few months and is associated with redness but no pain or fever. She had a mammogram and was given antibiotics, which did not alleviate the swelling.  The patient's breast cancer was diagnosed over a year ago and she is currently on anastrozole, which she tolerates well with no reported hot flashes. She had a mammogram in October of the previous year, which was normal. Her bone density was last checked in August two years prior and was also normal. She is  due for another bone density scan but has had issues with scheduling due to Medicare coverage. She takes calcium and vitamin D supplements and maintains an active lifestyle.  In addition to anastrozole, the patient is on lisinopril and cortisone for blood pressure, Eliquis for atrial fibrillation, and Risperdal and Lamictal for bipolar disorder, which is under control. She also takes B12 supplements.         All other systems were reviewed with the patient and are negative.  MEDICAL HISTORY:  Past Medical History:  Diagnosis Date   Anxiety    Bipolar 1 disorder (HCC)    Chicken pox    Depression    Hypertension    high blood pressure readings    Kidney disease    kidney stones and saw urologist, Dr. Dillard Cannon for lithotripsy remotely    PAF (paroxysmal atrial fibrillation) [facility])    Personal history of radiation therapy     SURGICAL HISTORY: Past Surgical History:  Procedure Laterality Date   BREAST BIOPSY  10/31/2022   MM LT RADIOACTIVE SEED LOC MAMMO GUIDE 10/31/2022 [facility] MAMMOGRAPHY   BREAST LUMPECTOMY Left 11/02/2022   BREAST LUMPECTOMY WITH RADIOACTIVE SEED LOCALIZATION Left 11/02/2022   Procedure: LEFT BREAST LUMPECTOMY WITH RADIOACTIVE SEED LOCALIZATION;  Surgeon: Harriette Bouillon, MD;  Location:  SURGERY CENTER;  Service: General;  Laterality: Left;   BUNIONECTOMY  11/27/2005   TONSILLECTOMY AND ADENOIDECTOMY  11/27/1973    I have reviewed the social history and family history with the patient and they are unchanged from previous note.  ALLERGIES:  is allergic to nefazodone and trazodone.  MEDICATIONS:  Current Outpatient Medications  Medication Sig Dispense Refill  acamprosate (CAMPRAL) 333 MG tablet Take 2 tablets (666 mg total) by mouth 3 (three) times daily with meals. 180 tablet 1   acamprosate (CAMPRAL) 333 MG tablet Take 666 mg by mouth 3 (three) times daily.     anastrozole (ARIMIDEX) 1 MG tablet Take 1 mg by mouth daily.     anastrozole  (ARIMIDEX) 1 MG tablet Take 1 tablet (1 mg total) by mouth daily. 90 tablet 1   apixaban (ELIQUIS) 5 MG TABS tablet Take 1 tablet (5 mg total) by mouth 2 (two) times daily. 28 tablet 0   Ascorbic Acid (VITAMIN C) 1000 MG tablet Take 1,000 mg by mouth at bedtime.     busPIRone (BUSPAR) 15 MG tablet Take 1 tablet (15 mg total) by mouth 3 (three) times daily. 270 tablet 1   Calcium Carbonate (CALCIUM 500 PO) Take 1 tablet by mouth at bedtime.     Cholecalciferol (VITAMIN D-3) 25 MCG (1000 UT) CAPS Take 4,000 Units by mouth at bedtime.     cyanocobalamin (VITAMIN B12) 1000 MCG/ML injection INJECT 1 ML (1,000 MCG) INTRAMUSCULARLY EVERY 30 DAYS 9 mL 1   diazepam (VALIUM) 10 MG tablet Take 0.5-1 tablets (5-10 mg total) by mouth every 8 (eight) hours as needed for anxiety. 90 tablet 2   diltiazem (CARDIZEM) 30 MG tablet Take 1 tablet every 4 hours AS NEEDED for AFIB heart rate >100 45 tablet 1   diltiazem (DILT-XR) 180 MG 24 hr capsule TAKE 1 CAPSULE BY MOUTH EVERY DAY 90 capsule 2   HYDROcodone-acetaminophen (NORCO) 5-325 MG tablet Take 1 tablet by mouth every 6 (six) hours as needed for moderate pain. 30 tablet 0   lamoTRIgine (LAMICTAL) 150 MG tablet Take 2 tablets (300 mg total) by mouth at bedtime. 180 tablet 1   lisinopril (ZESTRIL) 20 MG tablet Take 1 tablet (20 mg total) by mouth daily. 90 tablet 1   Magnesium 400 MG CAPS Take 400 mg by mouth at bedtime.     risperiDONE (RISPERDAL) 3 MG tablet TAKE 1 TABLET BY MOUTH AT BEDTIME. 90 tablet 1   thiamine (VITAMIN B1) 100 MG tablet Take 100 mg by mouth daily.     traMADol (ULTRAM) 50 MG tablet Take 50 mg by mouth every 6 (six) hours as needed.     traZODone (DESYREL) 50 MG tablet Take 1-2 tablets (50-100 mg total) by mouth at bedtime as needed. for sleep 180 tablet 1   No current facility-administered medications for this visit.    PHYSICAL EXAMINATION: ECOG PERFORMANCE STATUS: 0 - Asymptomatic  Vitals:   01/10/24 1028  BP: 123/76  Pulse: 92   Resp: 17  Temp: (!) 97.3 F (36.3 C)  SpO2: 99%   Wt Readings from Last 3 Encounters:  01/10/24 155 lb 4.8 oz (70.4 kg)  12/24/23 152 lb 6.4 oz (69.1 kg)  12/21/23 151 lb 1.6 oz (68.5 kg)     GENERAL:alert, no distress and comfortable SKIN: skin color, texture, turgor are normal, no rashes or significant lesions EYES: normal, Conjunctiva are pink and non-injected, sclera clear NECK: supple, thyroid normal size, non-tender, without nodularity LYMPH:  no palpable lymphadenopathy in the cervical, axillary  LUNGS: clear to auscultation and percussion with normal breathing effort HEART: regular rate & rhythm and no murmurs and no lower extremity edema ABDOMEN:abdomen soft, non-tender and normal bowel sounds Musculoskeletal:no cyanosis of digits and no clubbing  BREAST: Left breast smaller than right, indicating asymmetry. Redness and warmth, indicative of inflammation. Right breast firm and warm compared  to normal breast, suggestive of lymphedema.      LABORATORY DATA:  I have reviewed the data as listed    Latest Ref Rng & Units 01/10/2024   10:14 AM 08/28/2023   12:17 PM 07/05/2023    1:24 PM  CBC  WBC 4.0 - 10.5 K/uL 5.5  7.1  6.3   Hemoglobin 12.0 - 15.0 g/dL 16.1  09.6  04.5   Hematocrit 36.0 - 46.0 % 39.4  39.5  33.4   Platelets 150 - 400 K/uL 248  231  254         Latest Ref Rng & Units 01/10/2024   10:14 AM 08/28/2023   12:17 PM 07/05/2023    1:24 PM  CMP  Glucose 70 - 99 mg/dL 91  409  81   BUN 8 - 23 mg/dL 8  8  <5   Creatinine 8.11 - 1.00 mg/dL 9.14  7.82  9.56   Sodium 135 - 145 mmol/L 128  127  129   Potassium 3.5 - 5.1 mmol/L 5.2  4.1  4.0   Chloride 98 - 111 mmol/L 90  91  94   CO2 22 - 32 mmol/L 31  28  29    Calcium 8.9 - 10.3 mg/dL 21.3  08.6  9.6   Total Protein 6.5 - 8.1 g/dL 7.0  7.4  6.5   Total Bilirubin 0.0 - 1.2 mg/dL 0.4  1.0  0.5   Alkaline Phos 38 - 126 U/L 79  69  67   AST 15 - 41 U/L 22  20  12    ALT 0 - 44 U/L 15  14  10         RADIOGRAPHIC STUDIES: I have personally reviewed the radiological images as listed and agreed with the findings in the report. No results found.    Orders Placed This Encounter  Procedures   Ambulatory referral to Physical Therapy    Referral Priority:   Routine    Referral Type:   Physical Medicine    Referral Reason:   Specialty Services Required    Requested Specialty:   Physical Therapy    Number of Visits Requested:   1   All questions were answered. The patient knows to call the clinic with any problems, questions or concerns. No barriers to learning was detected. The total time spent in the appointment was 25 minutes.     Malachy Mood, MD 01/10/2024

## 2024-01-16 ENCOUNTER — Emergency Department (HOSPITAL_COMMUNITY)
Admission: EM | Admit: 2024-01-16 | Discharge: 2024-01-16 | Disposition: A | Discharge status: Home / Self Care | Payer: PPO | Attending: Emergency Medicine | Admitting: Emergency Medicine

## 2024-01-16 ENCOUNTER — Other Ambulatory Visit: Payer: Self-pay | Admitting: Internal Medicine

## 2024-01-16 ENCOUNTER — Encounter (HOSPITAL_COMMUNITY): Payer: Self-pay

## 2024-01-16 ENCOUNTER — Emergency Department (HOSPITAL_COMMUNITY): Payer: PPO

## 2024-01-16 ENCOUNTER — Other Ambulatory Visit: Payer: Self-pay

## 2024-01-16 DIAGNOSIS — R609 Edema, unspecified: Secondary | ICD-10-CM | POA: Diagnosis not present

## 2024-01-16 DIAGNOSIS — Z79899 Other long term (current) drug therapy: Secondary | ICD-10-CM | POA: Insufficient documentation

## 2024-01-16 DIAGNOSIS — S2242XA Multiple fractures of ribs, left side, initial encounter for closed fracture: Secondary | ICD-10-CM | POA: Insufficient documentation

## 2024-01-16 DIAGNOSIS — M79673 Pain in unspecified foot: Secondary | ICD-10-CM | POA: Diagnosis not present

## 2024-01-16 DIAGNOSIS — W06XXXA Fall from bed, initial encounter: Secondary | ICD-10-CM | POA: Diagnosis not present

## 2024-01-16 DIAGNOSIS — I10 Essential (primary) hypertension: Secondary | ICD-10-CM | POA: Diagnosis not present

## 2024-01-16 DIAGNOSIS — M19072 Primary osteoarthritis, left ankle and foot: Secondary | ICD-10-CM | POA: Diagnosis not present

## 2024-01-16 DIAGNOSIS — M79675 Pain in left toe(s): Secondary | ICD-10-CM | POA: Diagnosis not present

## 2024-01-16 DIAGNOSIS — Z7901 Long term (current) use of anticoagulants: Secondary | ICD-10-CM | POA: Diagnosis not present

## 2024-01-16 DIAGNOSIS — W19XXXA Unspecified fall, initial encounter: Secondary | ICD-10-CM | POA: Diagnosis not present

## 2024-01-16 DIAGNOSIS — M2012 Hallux valgus (acquired), left foot: Secondary | ICD-10-CM | POA: Diagnosis not present

## 2024-01-16 DIAGNOSIS — R0781 Pleurodynia: Secondary | ICD-10-CM | POA: Diagnosis not present

## 2024-01-16 LAB — COMPREHENSIVE METABOLIC PANEL
ALT: 16 U/L (ref 0–44)
AST: 21 U/L (ref 15–41)
Albumin: 3.7 g/dL (ref 3.5–5.0)
Alkaline Phosphatase: 82 U/L (ref 38–126)
Anion gap: 7 (ref 5–15)
BUN: 7 mg/dL — ABNORMAL LOW (ref 8–23)
CO2: 29 mmol/L (ref 22–32)
Calcium: 9.5 mg/dL (ref 8.9–10.3)
Chloride: 95 mmol/L — ABNORMAL LOW (ref 98–111)
Creatinine, Ser: 0.55 mg/dL (ref 0.44–1.00)
GFR, Estimated: >60 mL/min (ref >60)
Glucose, Bld: 108 mg/dL — ABNORMAL HIGH (ref 70–99)
Potassium: 4.3 mmol/L (ref 3.5–5.1)
Sodium: 131 mmol/L — ABNORMAL LOW (ref 135–145)
Total Bilirubin: 0.8 mg/dL (ref 0.0–1.2)
Total Protein: 6.5 g/dL (ref 6.5–8.1)

## 2024-01-16 LAB — CBC
HCT: 40.8 % (ref 36.0–46.0)
Hemoglobin: 13.4 g/dL (ref 12.0–15.0)
MCH: 32.4 pg (ref 26.0–34.0)
MCHC: 32.8 g/dL (ref 30.0–36.0)
MCV: 98.8 fL (ref 80.0–100.0)
Platelets: 218 10*3/uL (ref 150–400)
RBC: 4.13 MIL/uL (ref 3.87–5.11)
RDW: 12.6 % (ref 11.5–15.5)
WBC: 6.2 10*3/uL (ref 4.0–10.5)
nRBC: 0 % (ref 0.0–0.2)

## 2024-01-16 MED ORDER — LIDOCAINE 5 % EX PTCH: 1.0000 | MEDICATED_PATCH | CUTANEOUS | 0 refills | Status: DC

## 2024-01-16 MED ORDER — HYDROCODONE-ACETAMINOPHEN 5-325 MG PO TABS: 2.0000 | ORAL_TABLET | ORAL | 0 refills | Status: DC | PRN

## 2024-01-16 MED ORDER — OXYCODONE-ACETAMINOPHEN 5-325 MG PO TABS
1.0000 | ORAL_TABLET | Freq: Once | ORAL | Status: AC
  Administered 2024-01-16: 1 via ORAL
  Filled 2024-01-16: qty 1

## 2024-01-16 NOTE — ED Provider Notes (Signed)
 [place] EMERGENCY DEPARTMENT AT [facility] Provider Note   CSN: [id number] Arrival date & time: 01/16/24  0436     History  Chief Complaint  Patient presents with   Margaret Mckenzie is a 73 y.o. female with past medical history significant for chronic A-fib, hypertension, anxiety, bipolar disorder, and alcohol abuse who presents after reportedly drinking last night, falling out of bed due to intoxication.  She denies hitting her head, she reports that she was conscious for the whole fall.  She endorses left toe pain and left rib pain.   Fall       Home Medications Prior to Admission medications   Medication Sig Start Date End Date Taking? Authorizing Provider  HYDROcodone-acetaminophen (NORCO/VICODIN) 5-325 MG tablet Take 2 tablets by mouth every 4 (four) hours as needed. 01/16/24  Yes Beautifull Cisar H, PA-C  lidocaine (LIDODERM) 5 % Place 1 patch onto the skin daily. Remove & Discard patch within 12 hours or as directed by MD 01/16/24  Yes Abiola Behring H, PA-C  acamprosate (CAMPRAL) 333 MG tablet Take 2 tablets (666 mg total) by mouth 3 (three) times daily with meals. 04/24/23   Cherie Ouch, PA-C  acamprosate (CAMPRAL) 333 MG tablet Take 666 mg by mouth 3 (three) times daily.    [provider]  anastrozole (ARIMIDEX) 1 MG tablet Take 1 mg by mouth daily.    [provider]  anastrozole (ARIMIDEX) 1 MG tablet Take 1 tablet (1 mg total) by mouth daily. 10/01/23   Malachy Mood, MD  apixaban (ELIQUIS) 5 MG TABS tablet Take 1 tablet (5 mg total) by mouth 2 (two) times daily. 08/17/23   Rollene Rotunda, MD  Ascorbic Acid (VITAMIN C) 1000 MG tablet Take 1,000 mg by mouth at bedtime.    [provider]  busPIRone (BUSPAR) 15 MG tablet Take 1 tablet (15 mg total) by mouth 3 (three) times daily. 08/10/23   Cherie Ouch, PA-C  Calcium Carbonate (CALCIUM 500 PO) Take 1 tablet by mouth at bedtime.    [provider]   Cholecalciferol (VITAMIN D-3) 25 MCG (1000 UT) CAPS Take 4,000 Units by mouth at bedtime.    [provider]  cyanocobalamin (VITAMIN B12) 1000 MCG/ML injection INJECT 1 ML (1,000 MCG) INTRAMUSCULARLY EVERY 30 DAYS 11/21/22   Philip Aspen, Limmie Patricia, MD  diazepam (VALIUM) 10 MG tablet Take 0.5-1 tablets (5-10 mg total) by mouth every 8 (eight) hours as needed for anxiety. 09/10/23   Melony Overly T, PA-C  diltiazem (CARDIZEM) 30 MG tablet Take 1 tablet every 4 hours AS NEEDED for AFIB heart rate >100 12/21/20   Rollene Rotunda, MD  diltiazem (DILT-XR) 180 MG 24 hr capsule TAKE 1 CAPSULE BY MOUTH EVERY DAY 10/22/23   Rollene Rotunda, MD  lamoTRIgine (LAMICTAL) 150 MG tablet Take 2 tablets (300 mg total) by mouth at bedtime. 08/10/23   Melony Overly T, PA-C  lisinopril (ZESTRIL) 20 MG tablet Take 1 tablet (20 mg total) by mouth daily. 12/17/23   Philip Aspen, Limmie Patricia, MD  Magnesium 400 MG CAPS Take 400 mg by mouth at bedtime.    [provider]  risperiDONE (RISPERDAL) 3 MG tablet TAKE 1 TABLET BY MOUTH AT BEDTIME. 08/12/23   Hurst, Rosey Bath T, PA-C  thiamine (VITAMIN B1) 100 MG tablet Take 100 mg by mouth daily.    [provider]  traMADol (ULTRAM) 50 MG tablet Take 50 mg by mouth every 6 (six) hours  as needed.    [provider]  traZODone (DESYREL) 50 MG tablet Take 1-2 tablets (50-100 mg total) by mouth at bedtime as needed. for sleep 08/10/23   Melony Overly T, PA-C      Allergies    Nefazodone and Trazodone    Review of Systems   Review of Systems  All other systems reviewed and are negative.   Physical Exam Updated Vital Signs BP 111/81   Pulse 89   Temp 98.2 F (36.8 C)   Resp 18   Wt 68.9 kg   SpO2 90%   BMI 26.09 kg/m  Physical Exam Vitals and nursing note reviewed.  Constitutional:      General: She is not in acute distress.    Appearance: Normal appearance.  HENT:     Head: Normocephalic and atraumatic.  Eyes:     General:         Right eye: No discharge.        Left eye: No discharge.  Cardiovascular:     Rate and Rhythm: Normal rate and regular rhythm.     Heart sounds: No murmur heard.    No friction rub. No gallop.  Pulmonary:     Effort: Pulmonary effort is normal.     Breath sounds: Normal breath sounds.  Abdominal:     General: Bowel sounds are normal.     Palpations: Abdomen is soft.  Musculoskeletal:     Comments: Focal tenderness to palpation without obvious step-off of left rib cage.  Some redness and soft tissue swelling noted of the left second toe.  Intact range of motion to flexion, extension at the toe with pain.  Skin:    General: Skin is warm and dry.     Capillary Refill: Capillary refill takes less than 2 seconds.  Neurological:     Mental Status: She is alert and oriented to person, place, and time.  Psychiatric:        Mood and Affect: Mood normal.        Behavior: Behavior normal.     ED Results / Procedures / Treatments   Labs (all labs ordered are listed, but only abnormal results are displayed) Labs Reviewed  COMPREHENSIVE METABOLIC PANEL - Abnormal; Notable for the following components:      Result Value   Sodium 131 (*)    Chloride 95 (*)    Glucose, Bld 108 (*)    BUN 7 (*)    All other components within normal limits  CBC    EKG None  Radiology DG Ribs Unilateral W/Chest Left Result Date: 01/16/2024 CLINICAL DATA:  73 year old female status post fall. Posterior rib pain. EXAM: LEFT RIBS AND CHEST - 3+ VIEW COMPARISON:  Chest radiographs 11/19/2018. FINDINGS: Portable AP semi upright view at 0522 hours. Lower lung volumes. Mediastinal contours remain within normal limits. Visualized tracheal air column is within normal limits. No pneumothorax or pulmonary edema. Left lung base hypo ventilation with no consolidation or pleural effusion. Mildly displaced fractures of left lateral ribs 8 and 9. Superimposed left chest wall, breast surgical clips. Other visible osseous  structures appear intact. Paucity of bowel gas in the left abdomen. IMPRESSION: 1. Mildly displaced left lateral 8th and 9th rib fractures. 2. Lower lung volumes with left lung hypo ventilation. No other acute cardiopulmonary abnormality. Electronically Signed   By: Odessa Fleming M.D.   On: 01/16/2024 05:52   DG Foot Complete Left Result Date: 01/16/2024 CLINICAL DATA:  Fall with great toe pain.  EXAM: LEFT FOOT - COMPLETE 3+ VIEW COMPARISON:  None Available. FINDINGS: Bones are diffusely demineralized. Hallux valgus deformity evident with mild degenerative changes at the great toe MTP joint. No evidence for an acute fracture. No subluxation or dislocation. 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 Electronically Signed   By: Kennith Center M.D.   On: 01/16/2024 05:20    Procedures Procedures    Medications Ordered in ED Medications  oxyCODONE-acetaminophen (PERCOCET/ROXICET) 5-325 MG per tablet 1 tablet (1 tablet Oral Given 01/16/24 1610)    ED Course/ Medical Decision Making/ A&P                                 Medical Decision Making Amount and/or Complexity of Data Reviewed Labs: ordered. Radiology: ordered.  Risk Prescription drug management.   This patient is a 73 y.o. female  who presents to the ED for concern of fall, rib pain, foot pain  Differential diagnoses prior to evaluation: The emergent differential diagnosis includes, but is not limited to, acute fracture, dislocation. This is not an exhaustive differential.   Past Medical History / Co-morbidities / Social History: Afib, htn, anxiety, bipolar, etoh abuse  Additional history: Chart reviewed. Pertinent results include: Reviewed outpatient family medicine, oncology visits, lab work, imaging from previous emergency department visits  Physical Exam: Physical exam performed. The pertinent findings include: Patient in A-fib, but normal rate, tenderness palpation noted of  left ribs, some redness of left second toe, no midfoot or ankle tenderness.  Lab Tests/Imaging studies: I personally interpreted labs/imaging and the pertinent results include: Independently interpreted plain film radiograph of the left foot, left ribs which shows mildly displaced left 8th and 9th rib fractures. CBC unremarkable, CMP notable for some hyponatremia but overall stable compared baseline to slightly improved, no other acute abnormalities noted I agree with the radiologist interpretation.  Cardiac monitoring: EKG obtained and interpreted by myself and attending physician which shows: A-fib which is patient's baseline   Medications: I ordered medication including Percocet for pain.  I have reviewed the patients home medicines and have made adjustments as needed.   Disposition: After consideration of the diagnostic results and the patients response to treatment, I feel that patient with 2 rib fractures, no other signs of significant traumatic injury.  Will plan to discharge with incentive spirometer, short course of pain medicine, and lidocaine patches.  Encouraged orthopedic follow-up.Marland Kitchen   emergency department workup does not suggest an emergent condition requiring admission or immediate intervention beyond what has been performed at this time. The plan is: as above. The patient is safe for discharge and has been instructed to return immediately for worsening symptoms, change in symptoms or any other concerns.  Final Clinical Impression(s) / ED Diagnoses Final diagnoses:  Closed fracture of multiple ribs of left side, initial encounter    Rx / DC Orders ED Discharge Orders          Ordered    HYDROcodone-acetaminophen (NORCO/VICODIN) 5-325 MG tablet  Every 4 hours PRN        01/16/24 0618    lidocaine (LIDODERM) 5 %  Every 24 hours        01/16/24 0618              Olene Floss, PA-C 01/16/24 9604    Shon Baton, MD 01/19/24 [id number]

## 2024-01-16 NOTE — ED Triage Notes (Signed)
BIB EMS/ pt was intoxicated at home and fell out of bed approx 6pm yesterday/ denies hitting head/ on eliquis/ pain to left great toe and left rib area/ pt is A&Ox4

## 2024-01-16 NOTE — Discharge Instructions (Addendum)
Please use Tylenol or ibuprofen for pain.  You may use 600 mg ibuprofen every 6 hours or 1000 mg of Tylenol every 6 hours.  You may choose to alternate between the 2.  This would be most effective.  Not to exceed 4 g of Tylenol within 24 hours.  Not to exceed 3200 mg ibuprofen 24 hours.  You can use the stronger narcotic pain medication in place of Tylenol for severe break through pain.  If you take the narcotic pain medication that we prescribed recommend that you also take a laxative such as MiraLAX or Dulcolax every day that you take the narcotic pain medicine, and drink plenty of fluids, 50 to 64 ounces to prevent any constipation.  You can place the lidocaine patch directly where you have pain.  I recommend that you use the incentive spirometer every hour while awake for the first 1 to 2 weeks to help to recruit all of your lung tissue and decrease platelet of developing a new pneumonia.  Please call and follow-up with orthopedic physician as needed, but do expect that you will have some tenderness on the left for least 4 weeks although you should experience improvement after around 2 to 3 weeks.

## 2024-01-17 ENCOUNTER — Telehealth: Payer: Self-pay

## 2024-01-17 NOTE — Telephone Encounter (Addendum)
Called patient to relay message below as per Dr. Mosetta Putt, no answer to call LVM to call us back.   ----- Message from Malachy Mood sent at 01/10/2024 11:10 PM EST ----- Please let pt know her Na is low and K is high, possible related to her psych meds, avoid high K food and increase salt intake, thanks  Malachy Mood

## 2024-01-17 NOTE — Telephone Encounter (Addendum)
Patient called back and I relayed the message below as per Dr. Mosetta Putt, patient voiced full understanding and had no further questions.   ----- Message from Malachy Mood sent at 01/10/2024 11:10 PM EST ----- Please let pt know her Na is low and K is high, possible related to her psych meds, avoid high K food and increase salt intake, thanks  Malachy Mood

## 2024-01-18 ENCOUNTER — Other Ambulatory Visit: Payer: Self-pay

## 2024-01-18 MED ORDER — ACAMPROSATE CALCIUM 333 MG PO TBEC: 666.0000 mg | DELAYED_RELEASE_TABLET | Freq: Three times a day (TID) | ORAL | 2 refills | Status: DC

## 2024-01-21 ENCOUNTER — Ambulatory Visit: Payer: Self-pay | Admitting: Internal Medicine

## 2024-01-21 NOTE — Telephone Encounter (Signed)
 Copied from CRM [id number]. Topic: Clinical - Red Word Triage >> Jan 21, 2024  7:46 AM [name] wrote: Red Word that prompted transfer to Nurse Triage: Margaret Mckenzie this past week and broke ribs and injured her left foot. Pain is 9/10. Needs an appt asap Ribs are getting better but the foot is awful, can't walk, out of walk, need to be back in work as quickly as possible   Chief Complaint: foot injury, post-ED visit Symptoms: swelling and bruising to foot/toes, limping in shoes, improving rib pain, 9/10 pain to foot Frequency: continual, intermittent Pertinent Negatives: Patient denies chest discomfort beyond what felt in hospital for ribs, cuts, SOB, dizziness Disposition: [] 911 / [] ED /[] Urgent Care (no appt availability in office) / [x] Appointment(In office/virtual)/ []  [facility]/ [] Home Care/ [] Refused Recommended Disposition /[] [facility]/ []  Follow-up with PCP Additional Notes: Pt reporting she "fell out of bed" and called EMS for broken ribs and injured foot on 2/19, examined in ED. Pt reporting closed fractures to ribs and confirms no fracture to toes, though 9/10 pain, bruising, and swelling to "big toe and little one next to it," "hard to answer" how much swelling and bruising but able to put on socks and work shoes, "can't walk far in them," limping "badly" in shoes, wearing slippers fine. Pt confirms rib pain has been improving but foot has not. Pt reporting she tried taking the prescribed percocet but "made me so woozy" so been off it since the second day after ED. Pt confirms OTC meds help "moderately" improve pain. Pt reporting she "take a lot of tylenol, took 4 over the last hour and repeat as needed," confirms taking as prescribed by ER, "but not enough to damage my liver," reporting pills are 650 mg each. Advised ensure no more than 4,000 mg of tylenol per day, pt verbalized understanding. Advised pt be examined within 3 days per protocol, no availability with PCP office  for today, scheduled for tomorrow am with PCP office, confirmed location/appt info, placed appt on waitlist, advised call back if worsening symptoms, SOB, or chest discomfort beyond what was examined and cleared by hospital. Pt verbalized understanding.  Reason for Disposition  [1] After 3 days AND [2] pain not improved  Answer Assessment - Initial Assessment Questions 1. MECHANISM: "How did the injury happen?" (e.g., twisting injury, direct blow)      Margaret Mckenzie out of bed 2. ONSET: "When did the injury happen?" (Minutes or hours ago)      Earlier last week, went to ER for it, called EMS 3. LOCATION: "Where is the injury located?"      Broke ribs and injured left foot 4. APPEARANCE of INJURY: "What does the injury look like?"      Bruising and swelling, fits into socks fine, work shoes okay but can't walk very far in them, had to take shoes off as  5. WEIGHT-BEARING: "Can you put weight on that foot?" "Can you walk (four steps or more)?"       Yes and able to take more than 4 steps 6. SIZE: For cuts, bruises, or swelling, ask: "How large is it?" (e.g., inches or centimeters;  entire joint)      Big toe and little one next to it 7. PAIN: "Is there pain?" If Yes, ask: "How bad is the pain?"    (e.g., Scale 1-10; or mild, moderate, severe)   - NONE (0): no pain.   - MILD (1-3): doesn't interfere with normal activities.    -  MODERATE (4-7): interferes with normal activities (e.g., work or school) or awakens from sleep, limping.    - SEVERE (8-10): excruciating pain, unable to do any normal activities, unable to walk.      9/10 for foot 9. OTHER SYMPTOMS: "Do you have any other symptoms?"      No chest pain outside of what was examined by hospital, no dizziness, no SOB  Answer Assessment - Initial Assessment Questions 3. LOCATION: "What part of the toe is injured?" "Is the nail damaged?"      Whole toes for big toe and little one next to it, no nail damage See other questionnaire for foot  injury  Protocols used: Ankle and Foot Injury-A-AH, Toe Injury-A-AH

## 2024-01-22 ENCOUNTER — Encounter: Payer: Self-pay | Admitting: Family Medicine

## 2024-01-22 ENCOUNTER — Ambulatory Visit (INDEPENDENT_AMBULATORY_CARE_PROVIDER_SITE_OTHER): Payer: PPO | Admitting: Family Medicine

## 2024-01-22 VITALS — BP 128/80 | HR 100 | Resp 16 | Ht 64.0 in

## 2024-01-22 DIAGNOSIS — M79672 Pain in left foot: Secondary | ICD-10-CM

## 2024-01-22 DIAGNOSIS — S2242XD Multiple fractures of ribs, left side, subsequent encounter for fracture with routine healing: Secondary | ICD-10-CM | POA: Diagnosis not present

## 2024-01-22 MED ORDER — DICLOFENAC SODIUM 1 % EX GEL: 2.0000 g | Freq: Four times a day (QID) | CUTANEOUS | 1 refills | Status: DC

## 2024-01-22 NOTE — Progress Notes (Signed)
 HPI: [name] is a 73 y.o. female chronic A-fib on chronic anticoagulation, hypertension, anxiety, and bipolar disorder, who is here today to follow on recent ED visit.  Pt presented to the ED on 01/16/24 after a fall - complaining of rib and foot pain after falling out of bed the day before.  No head trauma or LOC. Per ED note: EKG showed A-fib, consistent with patient's baseline. Imaging revealed "mildly displaced left 8th and 9th rib fractures" and "plain film radiograph of the left foot."  Today, she complains of pain, swelling, and mild redness in her left foot - most notably her great toe and 2nd digit.  Her pain mostly occurs when walking and standing on her feet for long periods of time.  She rates her pain a 7/10. Pt works as a Production assistant, radio at [employer] and states she has been unable to work up to 5 hours.  She notes her rib pain has improved, and denies any current rib pain today.  Denies any cough, wheezing, and difficulty breathing.  Denies any known hx of gout.  Of note, pt is on Eliquis.   Review of Systems  Constitutional:  Negative for appetite change, chills and fever.  Cardiovascular:  Negative for chest pain.  Gastrointestinal:  Negative for abdominal pain, nausea and vomiting.  Genitourinary:  Negative for decreased urine volume, dysuria and hematuria.  Skin:  Negative for rash and wound.  Neurological:  Negative for syncope and weakness.  See other pertinent positives and negatives in HPI.  Current Outpatient Medications on File Prior to Visit  Medication Sig Dispense Refill   acamprosate (CAMPRAL) 333 MG tablet Take 666 mg by mouth 3 (three) times daily.     acamprosate (CAMPRAL) 333 MG tablet Take 2 tablets (666 mg total) by mouth 3 (three) times daily with meals. 180 tablet 2   anastrozole (ARIMIDEX) 1 MG tablet Take 1 mg by mouth daily.     anastrozole (ARIMIDEX) 1 MG tablet Take 1 tablet (1 mg total) by mouth daily. 90 tablet 1   apixaban  (ELIQUIS) 5 MG TABS tablet Take 1 tablet (5 mg total) by mouth 2 (two) times daily. 28 tablet 0   Ascorbic Acid (VITAMIN C) 1000 MG tablet Take 1,000 mg by mouth at bedtime.     busPIRone (BUSPAR) 15 MG tablet Take 1 tablet (15 mg total) by mouth 3 (three) times daily. 270 tablet 1   Calcium Carbonate (CALCIUM 500 PO) Take 1 tablet by mouth at bedtime.     Cholecalciferol (VITAMIN D-3) 25 MCG (1000 UT) CAPS Take 4,000 Units by mouth at bedtime.     cyanocobalamin (VITAMIN B12) 1000 MCG/ML injection INJECT 1 ML (1,000 MCG) INTRAMUSCULARLY EVERY 30 DAYS 3 mL 8   diazepam (VALIUM) 10 MG tablet Take 0.5-1 tablets (5-10 mg total) by mouth every 8 (eight) hours as needed for anxiety. 90 tablet 2   diltiazem (CARDIZEM) 30 MG tablet Take 1 tablet every 4 hours AS NEEDED for AFIB heart rate >100 45 tablet 1   diltiazem (DILT-XR) 180 MG 24 hr capsule TAKE 1 CAPSULE BY MOUTH EVERY DAY 90 capsule 2   lamoTRIgine (LAMICTAL) 150 MG tablet Take 2 tablets (300 mg total) by mouth at bedtime. 180 tablet 1   lidocaine (LIDODERM) 5 % Place 1 patch onto the skin daily. Remove & Discard patch within 12 hours or as directed by MD 30 patch 0   lisinopril (ZESTRIL) 20 MG tablet Take 1 tablet (20 mg total)  by mouth daily. 90 tablet 1   Magnesium 400 MG CAPS Take 400 mg by mouth at bedtime.     risperiDONE (RISPERDAL) 3 MG tablet TAKE 1 TABLET BY MOUTH AT BEDTIME. 90 tablet 1   thiamine (VITAMIN B1) 100 MG tablet Take 100 mg by mouth daily.     traMADol (ULTRAM) 50 MG tablet Take 50 mg by mouth every 6 (six) hours as needed.     traZODone (DESYREL) 50 MG tablet Take 1-2 tablets (50-100 mg total) by mouth at bedtime as needed. for sleep 180 tablet 1   HYDROcodone-acetaminophen (NORCO/VICODIN) 5-325 MG tablet Take 2 tablets by mouth every 4 (four) hours as needed. (Patient not taking: Reported on 01/22/2024) 10 tablet 0   No current facility-administered medications on file prior to visit.    Past Medical History:   Diagnosis Date   Anxiety    Bipolar 1 disorder (HCC)    Chicken pox    Depression    Hypertension    high blood pressure readings    Kidney disease    kidney stones and saw urologist, Dr. Dillard Mckenzie for lithotripsy remotely    PAF (paroxysmal atrial fibrillation) (HCC)    Personal history of radiation therapy    Allergies  Allergen Reactions   Nefazodone Other (See Comments)    Hypersensitivity   Trazodone Other (See Comments)    Trazodone causes lethargy the morning after taking it    Social History   Socioeconomic History   Marital status: Divorced    Spouse name: Not on file   Number of children: 0   Years of education: Not on file   Highest education level: Associate degree: occupational, Scientist, product/process development, or vocational program  Occupational History   Not on file  Tobacco Use   Smoking status: Every Day    Types: E-cigarettes   Smokeless tobacco: Never   Tobacco comments:    pt has quit smoking  Vaping Use   Vaping status: Every Day   Substances: Nicotine, Flavoring  Substance and Sexual Activity   Alcohol use: Yes    Alcohol/week: 5.0 standard drinks of alcohol    Types: 5 Glasses of wine per week    Comment: daily for 40 years   Drug use: No   Sexual activity: Not on file  Other Topics Concern   Not on file  Social History Narrative   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[employer] Situation: lives alone      Spiritual Beliefs: episcopalian      Lifestyle: had been going to Y but currently dealing with meniscal tear; diet good            Social Drivers of Corporate investment banker Strain: Low Risk  (01/12/2023)   Overall Financial Resource Strain (CARDIA)    Difficulty of Paying Living Expenses: Not very hard  Food Insecurity: Unknown (01/12/2023)   Hunger Vital Sign    Worried About Running Out of Food in the Last Year: Never true    Ran Out of Food in the Last Year: Not on file  Transportation Needs: No Transportation Needs  (01/12/2023)   PRAPARE - Administrator, Civil Service (Medical): No    Lack of Transportation (Non-Medical): No  Physical Activity: Sufficiently Active (10/05/2022)   Exercise Vital Sign    Days of Exercise per Week: 4 days    Minutes of Exercise per Session: 150+ min  Stress: Stress Concern Present (10/05/2022)  [employer] of Occupational Health - Occupational Stress Questionnaire    Feeling of Stress : To some extent  Social Connections: Not on file   Vitals:   01/22/24 0713  BP: 128/80  Pulse: 100  Resp: 16  SpO2: 98%   Body mass index is 26.09 kg/m.  Physical Exam Vitals and nursing note reviewed.  Constitutional:      General: She is not in acute distress.    Appearance: She is well-developed.  HENT:     Head: Normocephalic and atraumatic.  Eyes:     Conjunctiva/sclera: Conjunctivae normal.  Cardiovascular:     Rate and Rhythm: Normal rate. Rhythm irregular.     Pulses:          Dorsalis pedis pulses are 2+ on the left side.       Posterior tibial pulses are 2+ on the left side.     Heart sounds: No murmur heard. Pulmonary:     Effort: Pulmonary effort is normal. No respiratory distress.     Breath sounds: Normal breath sounds.  Musculoskeletal:     Left ankle: No ecchymosis. No tenderness. Normal range of motion.     Left foot: Normal capillary refill. No bony tenderness or crepitus. Normal pulse.     Comments: Left bunion, edema great toe and 2nd toe with mild erythema, no local heat. Mild pain with passive great toe flextion, no pain with palpation or actieve ROM. No significant limitations of MTP joints.  Feet:     Comments: Left foot: Swelling, redness, slight pain with flexion, warm to touch Skin:    General: Skin is warm.     Findings: No rash.  Neurological:     General: No focal deficit present.     Mental Status: She is alert and oriented to person, place, and time.     Comments: Stable gait, not assisted.  Psychiatric:         Mood and Affect: Mood and affect normal.   ASSESSMENT AND PLAN:  Ms. Sharonlee Nine was seen today for ED follow up after a fall (left foot pain).   Foot pain, left Started after recent injury. Still reporting 7/10 pain level, not getting worse. Foot X ray negative for fractures and examination today did not elicit significant pain. We discussed differential Dx's including OA and gout. I do not think we need to repeat imaging today. LE elevation, Tylenol 650 mg 3-4 times per day, and topical Voltaren gel recommended. Comfortable shoes and even shoe inserts may help. F/U as needed with PCP.  -     Diclofenac Sodium; Apply 2 g topically 4 (four) times daily.  Dispense: 150 g; Refill: 1  Closed fracture of multiple ribs of left side with routine healing, subsequent encounter Mildly displaced left 8th and 9th rib. Healing well, pain has greatly improved.  I spent a total of 25 minutes in both face to face and non face to face activities for this visit on the date of this encounter. During this time history was obtained and documented, examination was performed, prior imaging reviewed, and assessment/plan discussed.  Return if symptoms worsen or fail to improve.  I, Isabelle Course, acting as a scribe for Jeannelle Wiens [place], MD., have documented all relevant documentation on the behalf of Deaira Leckey [place], MD, as directed by  Arriyah Madej [place], MD while in the presence of Arriyah Madej [place], MD.  I, Casi Westerfeld [place], MD, have reviewed all documentation for this visit. The documentation on 01/22/24 for the exam, diagnosis,  procedures, and orders are all accurate and complete.  Alba Perillo G. [place], MD  [facility]. [place] office.

## 2024-01-22 NOTE — Patient Instructions (Signed)
 A few things to remember from today's visit:  Injury of left foot, subsequent encounter  Closed fracture of multiple ribs of left side with routine healing, subsequent encounter  Left leg elevation a few times per day to help with swelling. Acetaminophen for pain management, 650 mg 3-4 times per day. Continue topical Lidocaine. Topical Voltaren gel may also help.  If you need refills for medications you take chronically, please call your pharmacy. Do not use My Chart to request refills or for acute issues that need immediate attention. If you send a my chart message, it may take a few days to be addressed, specially if I am not in the office.  Please be sure medication list is accurate. If a new problem present, please set up appointment sooner than planned today.

## 2024-01-23 ENCOUNTER — Telehealth: Payer: Self-pay | Admitting: Internal Medicine

## 2024-01-23 NOTE — Telephone Encounter (Signed)
 Copied from CRM [id number]. Topic: General - Other >> Jan 23, 2024  7:49 AM [place] A wrote: Reason for CRM: Patient would like doctors note for work-

## 2024-01-23 NOTE — Telephone Encounter (Signed)
 I called and spoke with patient. She is needing a note for work due to the pain in her foot. She is not able to sit down at work like provider had suggested during visit yesterday. Advised I would speak with provider & have a note put up front for her to pick up. Pt verbalized understanding.

## 2024-01-23 NOTE — Telephone Encounter (Signed)
 Letter created & placed up front.

## 2024-02-15 ENCOUNTER — Telehealth: Payer: Self-pay | Admitting: Physician Assistant

## 2024-02-15 DIAGNOSIS — M5416 Radiculopathy, lumbar region: Secondary | ICD-10-CM | POA: Diagnosis not present

## 2024-02-15 NOTE — Telephone Encounter (Signed)
 Angelic called stating that she saw her neurosurgeon recently for a bulging disc. She gets injections. She takes Diazepam and Hydrocodone. She wants to know how high she can go with the Hydrocodone? Phone number is [phone].

## 2024-02-15 NOTE — Telephone Encounter (Signed)
 Pt said her neurosurgeon's PA asked her to call us to see how much hydrocodone she could take while on Valium. She is prescribed Valium 10 mg 1/2 to 1 tab q8h. She is taking hydrocodone 5-325 and from what I can see on PMPD she is not getting it regularly.  I asked her if she was needing a higher dose or more frequency and she said a higher dose.

## 2024-02-16 ENCOUNTER — Other Ambulatory Visit: Payer: Self-pay | Admitting: Physician Assistant

## 2024-02-17 NOTE — Telephone Encounter (Signed)
 Of course take as little as possible of both drugs b/c possible respiratory depression, imbalance, falls, etc. But if absolutely necessary, it's ok to take both drugs, don't take the Valium within 4 hours of taking the hydrocodone. Increasing Hydrocodone if her neurosurgery PA sees it's appropriate is ok, but have her cut back on the Valium 10 mg to bid prn. Don't stop the Valium cold-turkey d/t possible dangerous withdrawals.

## 2024-02-18 NOTE — Telephone Encounter (Signed)
Recommendations reviewed with patient.

## 2024-02-21 DIAGNOSIS — M5416 Radiculopathy, lumbar region: Secondary | ICD-10-CM | POA: Diagnosis not present

## 2024-02-26 ENCOUNTER — Other Ambulatory Visit: Payer: Self-pay

## 2024-02-26 ENCOUNTER — Encounter: Payer: Self-pay | Admitting: Physical Therapy

## 2024-02-26 ENCOUNTER — Ambulatory Visit: Attending: Hematology | Admitting: Physical Therapy

## 2024-02-26 DIAGNOSIS — Z170 Estrogen receptor positive status [ER+]: Secondary | ICD-10-CM | POA: Diagnosis not present

## 2024-02-26 DIAGNOSIS — L599 Disorder of the skin and subcutaneous tissue related to radiation, unspecified: Secondary | ICD-10-CM | POA: Diagnosis not present

## 2024-02-26 DIAGNOSIS — I890 Lymphedema, not elsewhere classified: Secondary | ICD-10-CM | POA: Insufficient documentation

## 2024-02-26 DIAGNOSIS — C50412 Malignant neoplasm of upper-outer quadrant of left female breast: Secondary | ICD-10-CM | POA: Insufficient documentation

## 2024-02-26 NOTE — Therapy (Signed)
 OUTPATIENT PHYSICAL THERAPY  UPPER EXTREMITY ONCOLOGY EVALUATION  Patient Name: Margaret Mckenzie MRN: [id number] DOB:1951-02-04, 73 y.o., female Today's Date: 02/26/2024  END OF SESSION:  PT End of Session - 02/26/24 1447     Visit Number 1    Number of Visits 9    Date for PT Re-Evaluation 03/25/24    PT Start Time 1407    PT Stop Time 1447    PT Time Calculation (min) 40 min    Activity Tolerance Patient tolerated treatment well    Behavior During Therapy WFL for tasks assessed/performed             Past Medical History:  Diagnosis Date   Anxiety    Bipolar 1 disorder (HCC)    Chicken pox    Depression    Hypertension    high blood pressure readings    Kidney disease    kidney stones and saw urologist, Dr. Dillard Cannon for lithotripsy remotely    PAF (paroxysmal atrial fibrillation) (HCC)    Personal history of radiation therapy    Past Surgical History:  Procedure Laterality Date   BREAST BIOPSY  10/31/2022   MM LT RADIOACTIVE SEED LOC MAMMO GUIDE 10/31/2022 [facility] MAMMO[facility]AP[facility]   BREAST LUMPECTOMY Left 11/02/2022   BREAST LUMPECTOMY WITH RADIOACTIVE SEED LOCALIZATION Left 11/02/2022   Procedure: LEFT BREAST LUMPECTOMY WITH RADIOACTIVE SEED LOCALIZATION;  Surgeon: Harriette Bouillon, MD;  Location: [facility];  Service: General;  Laterality: Left;   BUNIONECTOMY  11/27/2005   TONSILLECTOMY AND ADENOIDECTOMY  11/27/1973   Patient Active Problem List   Diagnosis Date Noted   NASH (nonalcoholic steatohepatitis) 12/11/2022   Transaminitis 11/09/2022   Malignant neoplasm of upper-outer quadrant of left breast in female, estrogen receptor positive (HCC) 10/09/2022   Chronic atrial fibrillation (HCC) 05/08/2022   Hyponatremia 07/15/2021   Generalized anxiety disorder 10/25/2020   Panic disorder 10/25/2020   Bipolar I disorder (HCC) 10/25/2020   Insomnia 10/25/2020   Vitamin B12 deficiency 04/23/2020   Paroxysmal atrial fibrillation (HCC) 08/31/2017    Essential hypertension 08/31/2017    PCP: Chaya Jan, MD  REFERRING PROVIDER: Malachy Mood, MD  REFERRING DIAG: [phone] (ICD-10-CM) - Malignant neoplasm of upper-outer quadrant of left breast in female, estrogen receptor positive (HCC)  THERAPY DIAG:  Lymphedema, not elsewhere classified  Disorder of the skin and subcutaneous tissue related to radiation, unspecified  Malignant neoplasm of upper-outer quadrant of left breast in female, estrogen receptor positive (HCC)  ONSET DATE: 08/28/23  Rationale for Evaluation and Treatment: Rehabilitation  SUBJECTIVE:  SUBJECTIVE STATEMENT: The swelling has been going on for about 6 months. I did not pay much attention to it. I thought it was a side effect of the radiation and would go away but it hasn't. It is red and hard.   PERTINENT HISTORY: 09/29/2022 diagnosed with L breast cancer, IDC, grade 2, ER/PR+, Ki67- 10%, L lumpectomy 11/02/22, completed radiation on 01/09/23, currently taking anastrozole, history of atrial fibrillation and bipolar   PAIN:  Are you having pain? No  PRECAUTIONS: R UE lymphedema risk  RED FLAGS: None   WEIGHT BEARING RESTRICTIONS: No  FALLS:  Has patient fallen in last 6 months? Yes. Number of falls 1 was in a hurry to get out of bed in the middle of the night and feet got tangled in the blankets and fell and fractured ribs  LIVING ENVIRONMENT: Lives with: lives alone Lives in: House/apartment Stairs: No;  Has following equipment at home: None  OCCUPATION: part time, server at [employer]   LEISURE: does not exercise  HAND DOMINANCE: right   PRIOR LEVEL OF FUNCTION: Independent  PATIENT GOALS: to reduce the swelling   OBJECTIVE: Note: Objective measures were completed at Evaluation unless  otherwise noted.  COGNITION: Overall cognitive status: Within functional limits for tasks assessed   PALPATION: Increased fibrosis throughout L breast, increased scar tissue at lateral L breast  OBSERVATIONS / OTHER ASSESSMENTS: L breast pulled upwards due to scar tissue and increased edema with fold at scar tissue at lateral breast  POSTURE: forward head, rounded shoulders  UPPER EXTREMITY AROM/PROM: WFL  LYMPHEDEMA ASSESSMENTS:   LANDMARK RIGHT  eval  At axilla  32  15 cm proximal to olecranon process 31.8  10 cm proximal to olecranon process 29  Olecranon process 25  15 cm proximal to ulnar styloid process 23.3  10 cm proximal to ulnar styloid process 20  Just proximal to ulnar styloid process 15.2  Across hand at thumb web space 18.5  At base of 2nd digit 6.2  (Blank rows = not tested)  LANDMARK LEFT  eval  At axilla  33  15 cm proximal to olecranon process 31  10 cm proximal to olecranon process 28  Olecranon process 25.2  15 cm proximal to ulnar styloid process 22.6  10 cm proximal to ulnar styloid process 19  Just proximal to ulnar styloid process 15.1  Across hand at thumb web space 18.2  At base of 2nd digit 6  (Blank rows = not tested)   BREAST COMPLAINTS SCALE:  BREAST COMPLAINTS QUESTIONNAIRE Pain:0 Heaviness:0 Swollen feeling:10 Tense Skin:3 Redness:10 Bra Print:0 Size of Pores:0 Hard feeling: 9 Total:   32  /80 A Score over 9 indicates lymphedema issues in the breast                                                                                                                             TREATMENT DATE:  02/26/24: In supine: Short neck, 5 diaphragmatic  breaths, R axillary nodes and establishment of interaxillary pathway, L inguinal nodes and establishment of axilloinguinal pathway, then L breast moving fluid towards pathways spending extra time in any areas of fibrosis then retracing all steps. Educated pt in anatomy and physiology of the  lymphatic system and basic MLD principles.  Gentle scar mobilization to scar Educated on need to obtain compression bra  PATIENT EDUCATION:  Education details: lymphedema, need for compression, anatomy and physiology of the lymphatic system Person educated: Patient Education method: Medical illustrator Education comprehension: verbalized understanding  HOME EXERCISE PROGRAM: Obtain a compression bra with maximal compression  ASSESSMENT:  CLINICAL IMPRESSION: Patient is a 73 y.o. female who was seen today for physical therapy evaluation and treatment for L breast lymphedema. Pt underwent a L breast lumpectomy with no nodes removed on 11/02/22. She completed radiation in early 2024. She reports she began developing lymphedema after that but thought it would go away. There is significant swelling, fibrosis and scar tissue present in L breast. Pt did not want to go to [facility] again because she was unable to don her compression bra she received. She was educated to obtain a maximal support compression bra that is commercially available. Pt would benefit from skilled PT services to decrease L breast lymphedema, decrease scar tissue and progress pt towards independent management of L breast lymphedema.    OBJECTIVE IMPAIRMENTS: decreased knowledge of condition, decreased knowledge of use of DME, increased edema, and increased fascial restrictions.   ACTIVITY LIMITATIONS:  none  PARTICIPATION LIMITATIONS:  none  PERSONAL FACTORS: Time since onset of injury/illness/exacerbation are also affecting patient's functional outcome.   REHAB POTENTIAL: Good  CLINICAL DECISION MAKING: Stable/uncomplicated  EVALUATION COMPLEXITY: Low  GOALS: Goals reviewed with patient? Yes  SHORT TERM GOALS=LONG TERM GOALS Target date: 03/25/24  Pt will be independent in self MLD for long term management of L breast lymphedema. Baseline: Goal status: INITIAL  2.  Pt will obtain an appropriate  compression bra for long term management of lymphedema.  Baseline:  Goal status: INITIAL  3.  Pt will demonstrate a 50% improvement in fibrosis and swelling in L breast to decrease risk of cellulitis.  Baseline:  Goal status: INITIAL   PLAN:  PT FREQUENCY: 2x/week  PT DURATION: 4 weeks  PLANNED INTERVENTIONS: 97164- PT Re-evaluation, 97110-Therapeutic exercises, 97530- Therapeutic activity, [id number]- Neuromuscular re-education, 97535- Self Care, 16109- Manual therapy, and 97760- Orthotic Fit/training  PLAN FOR NEXT SESSION: instruct pt in self MLD for L breast, did she get a bra?  Milagros Loll [place], PT 02/26/2024, 2:48 PM

## 2024-03-01 ENCOUNTER — Other Ambulatory Visit: Payer: Self-pay | Admitting: Physician Assistant

## 2024-03-04 ENCOUNTER — Ambulatory Visit: Admitting: Physical Therapy

## 2024-03-05 DIAGNOSIS — M25571 Pain in right ankle and joints of right foot: Secondary | ICD-10-CM | POA: Diagnosis not present

## 2024-03-06 ENCOUNTER — Ambulatory Visit: Admitting: Rehabilitation

## 2024-03-10 ENCOUNTER — Encounter: Payer: Self-pay | Admitting: Rehabilitation

## 2024-03-10 ENCOUNTER — Ambulatory Visit: Admitting: Rehabilitation

## 2024-03-10 DIAGNOSIS — I890 Lymphedema, not elsewhere classified: Secondary | ICD-10-CM

## 2024-03-10 DIAGNOSIS — Z170 Estrogen receptor positive status [ER+]: Secondary | ICD-10-CM

## 2024-03-10 DIAGNOSIS — L599 Disorder of the skin and subcutaneous tissue related to radiation, unspecified: Secondary | ICD-10-CM

## 2024-03-10 NOTE — Therapy (Signed)
 OUTPATIENT PHYSICAL THERAPY  UPPER EXTREMITY ONCOLOGY   Patient Name: Margaret Mckenzie MRN: [id number] DOB:09-25-1951, 73 y.o., female Today's Date: 03/10/2024  END OF SESSION:  PT End of Session - 03/10/24 1709     Visit Number 2    Number of Visits 9    Date for PT Re-Evaluation 03/25/24    PT Start Time 1555    PT Stop Time 1638    PT Time Calculation (min) 43 min    Activity Tolerance Patient tolerated treatment well    Behavior During Therapy WFL for tasks assessed/performed              Past Medical History:  Diagnosis Date   Anxiety    Bipolar 1 disorder (HCC)    Chicken pox    Depression    Hypertension    high blood pressure readings    Kidney disease    kidney stones and saw urologist, Dr. Patty Borg for lithotripsy remotely    PAF (paroxysmal atrial fibrillation) (HCC)    Personal history of radiation therapy    Past Surgical History:  Procedure Laterality Date   BREAST BIOPSY  10/31/2022   MM LT RADIOACTIVE SEED LOC MAMMO GUIDE 10/31/2022 [facility]   BREAST LUMPECTOMY Left 11/02/2022   BREAST LUMPECTOMY WITH RADIOACTIVE SEED LOCALIZATION Left 11/02/2022   Procedure: LEFT BREAST LUMPECTOMY WITH RADIOACTIVE SEED LOCALIZATION;  Surgeon: Sim Dryer, MD;  Location: [facility];  Service: General;  Laterality: Left;   BUNIONECTOMY  11/27/2005   TONSILLECTOMY AND ADENOIDECTOMY  11/27/1973   Patient Active Problem List   Diagnosis Date Noted   NASH (nonalcoholic steatohepatitis) 12/11/2022   Transaminitis 11/09/2022   Malignant neoplasm of upper-outer quadrant of left breast in female, estrogen receptor positive (HCC) 10/09/2022   Chronic atrial fibrillation (HCC) 05/08/2022   Hyponatremia 07/15/2021   Generalized anxiety disorder 10/25/2020   Panic disorder 10/25/2020   Bipolar I disorder (HCC) 10/25/2020   Insomnia 10/25/2020   Vitamin B12 deficiency 04/23/2020   Paroxysmal atrial fibrillation (HCC) 08/31/2017    Essential hypertension 08/31/2017    PCP: Marguerita Shih, MD  REFERRING PROVIDER: Sonja [place], MD  REFERRING DIAG: [phone] (ICD-10-CM) - Malignant neoplasm of upper-outer quadrant of left breast in female, estrogen receptor positive (HCC)  THERAPY DIAG:  Lymphedema, not elsewhere classified  Disorder of the skin and subcutaneous tissue related to radiation, unspecified  Malignant neoplasm of upper-outer quadrant of left breast in female, estrogen receptor positive (HCC)  ONSET DATE: 08/28/23  Rationale for Evaluation and Treatment: Rehabilitation  SUBJECTIVE:  SUBJECTIVE STATEMENT:  My Rt top of the foot has started to hurt.  I am off work this week because it hurts to work.    EVAL: The swelling has been going on for about 6 months. I did not pay much attention to it. I thought it was a side effect of the radiation and would go away but it hasn't. It is red and hard.   PERTINENT HISTORY: 09/29/2022 diagnosed with L breast cancer, IDC, grade 2, ER/PR+, Ki67- 10%, L lumpectomy 11/02/22, completed radiation on 01/09/23, currently taking anastrozole, history of atrial fibrillation and bipolar   PAIN:  Are you having pain? No  PRECAUTIONS: R UE lymphedema risk  RED FLAGS: None   WEIGHT BEARING RESTRICTIONS: No  FALLS:  Has patient fallen in last 6 months? Yes. 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  COGNITION: Overall cognitive status: Within functional limits for tasks assessed   PALPATION: Increased fibrosis throughout L breast, increased scar tissue at lateral L breast  OBSERVATIONS / OTHER ASSESSMENTS: L breast pulled upwards due to scar tissue and increased edema with fold at scar tissue at lateral breast  POSTURE: forward head, rounded shoulders  UPPER EXTREMITY AROM/PROM: WFL  LYMPHEDEMA ASSESSMENTS:   LANDMARK RIGHT  eval  At axilla  32  15 cm proximal to olecranon process 31.8  10 cm proximal to olecranon process 29  Olecranon process 25  15 cm proximal to ulnar styloid process 23.3  10 cm proximal to ulnar styloid process 20  Just proximal to ulnar styloid process 15.2  Across hand at thumb web space 18.5  At base of 2nd digit 6.2  (Blank rows = not tested)  LANDMARK LEFT  eval  At axilla  33  15 cm proximal to olecranon process 31  10 cm proximal to olecranon process 28  Olecranon process 25.2  15 cm proximal to ulnar styloid process 22.6  10 cm proximal to ulnar styloid process 19  Just proximal to ulnar styloid process 15.1  Across hand at thumb web space 18.2  At base of 2nd digit 6  (Blank rows = not tested)   BREAST COMPLAINTS SCALE:  BREAST COMPLAINTS QUESTIONNAIRE Pain:0 Heaviness:0 Swollen feeling:10 Tense Skin:3 Redness:10 Bra Print:0 Size of Pores:0 Hard feeling: 9 Total:   32  /80 A Score over 9 indicates lymphedema issues in the breast  TREATMENT DATE:  Pt permission and consent throughout each step of examination and treatment with modification and draping if requested when working on sensitive areas  03/10/24: In supine: Short neck, 5 diaphragmatic breaths, bil axillary nodes and  establishment of interaxillary pathway, L inguinal nodes and establishment of axilloinguinal pathway, then L breast moving fluid towards pathways spending extra time in any areas of fibrosis then retracing all steps. Educated pt in anatomy and physiology of the lymphatic system and basic MLD principles. Gave handout of all steps. Then PT also performed in sidelying posterior work and STM to the lateral breast and trunk STM to lateral breast and trunk  02/26/24: In supine: Short neck, 5 diaphragmatic breaths, R axillary nodes and establishment of interaxillary pathway, L inguinal nodes and establishment of axilloinguinal pathway, then L breast moving fluid towards pathways spending extra time in any areas of fibrosis then retracing all steps. Educated pt in anatomy and physiology of the lymphatic system and basic MLD principles.  Gentle scar mobilization to scar Educated on need to obtain compression bra  PATIENT EDUCATION:  Education details: lymphedema, need for compression, anatomy and physiology of the lymphatic system Person educated: Patient Education method: Medical illustrator Education comprehension: verbalized understanding  HOME EXERCISE PROGRAM: Obtain a compression bra with maximal compression  ASSESSMENT:  CLINICAL IMPRESSION: Pt got some new bras but they have no real compression.  She reports she doesn't really want to go to [employer] to [employer] and does not have the funds to pay for them otherwise.  She does report that they feel better already without the underwire so this may be enough.  Started MLD and STM.  Lt breast is retraction and firm with a lot of firmness at the lateral breast/incision/trunk which softened with STM more than MLD today.   OBJECTIVE IMPAIRMENTS: decreased knowledge of condition, decreased knowledge of use of DME, increased edema, and increased fascial restrictions.   ACTIVITY LIMITATIONS:  none  PARTICIPATION LIMITATIONS:  none  PERSONAL  FACTORS: Time since onset of injury/illness/exacerbation are also affecting patient's functional outcome.   REHAB POTENTIAL: Good  CLINICAL DECISION MAKING: Stable/uncomplicated  EVALUATION COMPLEXITY: Low  GOALS: Goals reviewed with patient? Yes  SHORT TERM GOALS=LONG TERM GOALS Target date: 03/25/24  Pt will be independent in self MLD for long term management of L breast lymphedema. Baseline: Goal status: INITIAL  2.  Pt will obtain an appropriate compression bra for long term management of lymphedema.  Baseline:  Goal status: INITIAL  3.  Pt will demonstrate a 50% improvement in fibrosis and swelling in L breast to decrease risk of cellulitis.  Baseline:  Goal status: INITIAL   PLAN:  PT FREQUENCY: 2x/week  PT DURATION: 4 weeks  PLANNED INTERVENTIONS: 97164- PT Re-evaluation, 97110-Therapeutic exercises, 97530- Therapeutic activity, [id number]- Neuromuscular re-education, 97535- Self Care, 09604- Manual therapy, and 97760- Orthotic Fit/training  PLAN FOR NEXT SESSION: review self MLD for L breast (intact system)  Elnora Quizon, Durward Gillis, PT 03/10/2024, 5:10 PM

## 2024-03-12 ENCOUNTER — Ambulatory Visit: Admitting: Rehabilitation

## 2024-03-18 ENCOUNTER — Encounter: Payer: Self-pay | Admitting: Physical Therapy

## 2024-03-18 ENCOUNTER — Ambulatory Visit: Admitting: Physical Therapy

## 2024-03-18 DIAGNOSIS — C50412 Malignant neoplasm of upper-outer quadrant of left female breast: Secondary | ICD-10-CM

## 2024-03-18 DIAGNOSIS — I890 Lymphedema, not elsewhere classified: Secondary | ICD-10-CM

## 2024-03-18 DIAGNOSIS — L599 Disorder of the skin and subcutaneous tissue related to radiation, unspecified: Secondary | ICD-10-CM

## 2024-03-18 NOTE — Therapy (Signed)
 OUTPATIENT PHYSICAL THERAPY  UPPER EXTREMITY ONCOLOGY   Patient Name: Margaret Mckenzie MRN: [id number] DOB:1951/11/14, 73 y.o., female Today's Date: 03/18/2024  END OF SESSION:  PT End of Session - 03/18/24 1551     Visit Number 3    Number of Visits 9    Date for PT Re-Evaluation 03/25/24    PT Start Time 1508    PT Stop Time 1556    PT Time Calculation (min) 48 min    Activity Tolerance Patient tolerated treatment well    Behavior During Therapy WFL for tasks assessed/performed               Past Medical History:  Diagnosis Date   Anxiety    Bipolar 1 disorder (HCC)    Chicken pox    Depression    Hypertension    high blood pressure readings    Kidney disease    kidney stones and saw urologist, Dr. Patty Borg for lithotripsy remotely    PAF (paroxysmal atrial fibrillation) (HCC)    Personal history of radiation therapy    Past Surgical History:  Procedure Laterality Date   BREAST BIOPSY  10/31/2022   MM LT RADIOACTIVE SEED LOC MAMMO GUIDE 10/31/2022 [facility]   BREAST LUMPECTOMY Left 11/02/2022   BREAST LUMPECTOMY WITH RADIOACTIVE SEED LOCALIZATION Left 11/02/2022   Procedure: LEFT BREAST LUMPECTOMY WITH RADIOACTIVE SEED LOCALIZATION;  Surgeon: Sim Dryer, MD;  Location: [facility];  Service: General;  Laterality: Left;   BUNIONECTOMY  11/27/2005   TONSILLECTOMY AND ADENOIDECTOMY  11/27/1973   Patient Active Problem List   Diagnosis Date Noted   NASH (nonalcoholic steatohepatitis) 12/11/2022   Transaminitis 11/09/2022   Malignant neoplasm of upper-outer quadrant of left breast in female, estrogen receptor positive (HCC) 10/09/2022   Chronic atrial fibrillation (HCC) 05/08/2022   Hyponatremia 07/15/2021   Generalized anxiety disorder 10/25/2020   Panic disorder 10/25/2020   Bipolar I disorder (HCC) 10/25/2020   Insomnia 10/25/2020   Vitamin B12 deficiency 04/23/2020   Paroxysmal atrial fibrillation (HCC) 08/31/2017    Essential hypertension 08/31/2017    PCP: Marguerita Shih, MD  REFERRING PROVIDER: Sonja Crystal, MD  REFERRING DIAG: [phone] (ICD-10-CM) - Malignant neoplasm of upper-outer quadrant of left breast in female, estrogen receptor positive (HCC)  THERAPY DIAG:  Lymphedema, not elsewhere classified  Disorder of the skin and subcutaneous tissue related to radiation, unspecified  Malignant neoplasm of upper-outer quadrant of left breast in female, estrogen receptor positive (HCC)  ONSET DATE: 08/28/23  Rationale for Evaluation and Treatment: Rehabilitation  SUBJECTIVE:  SUBJECTIVE STATEMENT:  My foot is still really hurting.   EVAL: The swelling has been going on for about 6 months. I did not pay much attention to it. I thought it was a side effect of the radiation and would go away but it hasn't. It is red and hard.   PERTINENT HISTORY: 09/29/2022 diagnosed with L breast cancer, IDC, grade 2, ER/PR+, Ki67- 10%, L lumpectomy 11/02/22, completed radiation on 01/09/23, currently taking anastrozole , history of atrial fibrillation and bipolar   PAIN:  Are you having pain? Yes 8/10, top of R foot, walking increases pain, rest relieves it  PRECAUTIONS: R UE lymphedema risk  RED FLAGS: None   WEIGHT BEARING RESTRICTIONS: No  FALLS:  Has patient fallen in last 6 months? Yes. 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  COGNITION: Overall cognitive status: Within functional limits for tasks assessed   PALPATION: Increased fibrosis throughout L breast, increased scar tissue at lateral L breast  OBSERVATIONS / OTHER ASSESSMENTS: L breast pulled upwards due to scar tissue and increased edema with fold at scar tissue at lateral breast  POSTURE: forward head, rounded shoulders  UPPER EXTREMITY AROM/PROM: WFL  LYMPHEDEMA ASSESSMENTS:   LANDMARK RIGHT  eval  At axilla  32  15 cm proximal to olecranon process 31.8  10 cm proximal to olecranon process 29  Olecranon process 25  15 cm proximal to ulnar styloid process 23.3  10 cm proximal to ulnar styloid process 20  Just proximal to ulnar styloid process 15.2  Across hand at thumb web space 18.5  At base of 2nd digit 6.2  (Blank rows = not tested)  LANDMARK LEFT  eval  At axilla  33  15 cm proximal to olecranon process 31  10 cm proximal to olecranon process 28  Olecranon process 25.2  15 cm proximal to ulnar styloid process 22.6  10 cm proximal to ulnar styloid process 19  Just proximal to ulnar styloid process 15.1  Across hand at thumb web space 18.2  At base of 2nd digit 6  (Blank rows = not tested)   BREAST COMPLAINTS SCALE:  BREAST COMPLAINTS QUESTIONNAIRE Pain:0 Heaviness:0 Swollen feeling:10 Tense Skin:3 Redness:10 Bra Print:0 Size of Pores:0 Hard feeling: 9 Total:   32  /80 A Score over 9 indicates lymphedema issues in the breast  TREATMENT DATE:  Pt permission and consent throughout each step of examination and treatment with modification and draping if requested when working on sensitive areas  03/18/24: Had pt demonstrate each step as follows: In supine: Short neck, 5  diaphragmatic breaths, bil axillary nodes and establishment of interaxillary pathway, L inguinal nodes and establishment of axilloinguinal pathway, then L breast moving fluid towards pathways spending extra time in any areas of fibrosis then retracing all steps. Provided v/c and t/c for correct pressure, skin stretch technique, direction of stretch and sequence.  STM to the lateral breast and trunk in areas of scar tissue/fibrosis  03/10/24: In supine: Short neck, 5 diaphragmatic breaths, bil axillary nodes and establishment of interaxillary pathway, L inguinal nodes and establishment of axilloinguinal pathway, then L breast moving fluid towards pathways spending extra time in any areas of fibrosis then retracing all steps. Educated pt in anatomy and physiology of the lymphatic system and basic MLD principles. Gave handout of all steps. Then PT also performed in sidelying posterior work and STM to the lateral breast and trunk STM to lateral breast and trunk  02/26/24: In supine: Short neck, 5 diaphragmatic breaths, R axillary nodes and establishment of interaxillary pathway, L inguinal nodes and establishment of axilloinguinal pathway, then L breast moving fluid towards pathways spending extra time in any areas of fibrosis then retracing all steps. Educated pt in anatomy and physiology of the lymphatic system and basic MLD principles.  Gentle scar mobilization to scar Educated on need to obtain compression bra  PATIENT EDUCATION:  Education details: lymphedema, need for compression, anatomy and physiology of the lymphatic system Person educated: Patient Education method: Medical illustrator Education comprehension: verbalized understanding  HOME EXERCISE PROGRAM: Obtain a compression bra with maximal compression  ASSESSMENT:  CLINICAL IMPRESSION: Continued to educate pt in proper self MLD technique. Pt is still requiring v/c and t/c for skin stretch technique, sequence, and direction of  stretch. Overall her fibrosis is improving and therapist spent extra time of STM to areas of fibrosis.   OBJECTIVE IMPAIRMENTS: decreased knowledge of condition, decreased knowledge of use of DME, increased edema, and increased fascial restrictions.   ACTIVITY LIMITATIONS:  none  PARTICIPATION LIMITATIONS:  none  PERSONAL FACTORS: Time since onset of injury/illness/exacerbation are also affecting patient's functional outcome.   REHAB POTENTIAL: Good  CLINICAL DECISION MAKING: Stable/uncomplicated  EVALUATION COMPLEXITY: Low  GOALS: Goals reviewed with patient? Yes  SHORT TERM GOALS=LONG TERM GOALS Target date: 03/25/24  Pt will be independent in self MLD for long term management of L breast lymphedema. Baseline: Goal status: INITIAL  2.  Pt will obtain an appropriate compression bra for long term management of lymphedema.  Baseline:  Goal status: INITIAL  3.  Pt will demonstrate a 50% improvement in fibrosis and swelling in L breast to decrease risk of cellulitis.  Baseline:  Goal status: INITIAL   PLAN:  PT FREQUENCY: 2x/week  PT DURATION: 4 weeks  PLANNED INTERVENTIONS: 97164- PT Re-evaluation, 97110-Therapeutic exercises, 97530- Therapeutic activity, [id number]- Neuromuscular re-education, 97535- Self Care, 16109- Manual therapy, and 97760- Orthotic Fit/training  PLAN FOR NEXT SESSION: review self MLD for L breast (intact system)  Deedee Farmer, PT 03/18/2024, 4:01 PM

## 2024-03-19 DIAGNOSIS — M25571 Pain in right ankle and joints of right foot: Secondary | ICD-10-CM | POA: Diagnosis not present

## 2024-03-20 ENCOUNTER — Ambulatory Visit: Admitting: Physical Therapy

## 2024-03-20 ENCOUNTER — Encounter: Payer: Self-pay | Admitting: Physical Therapy

## 2024-03-20 DIAGNOSIS — I890 Lymphedema, not elsewhere classified: Secondary | ICD-10-CM

## 2024-03-20 DIAGNOSIS — L599 Disorder of the skin and subcutaneous tissue related to radiation, unspecified: Secondary | ICD-10-CM

## 2024-03-20 DIAGNOSIS — Z170 Estrogen receptor positive status [ER+]: Secondary | ICD-10-CM

## 2024-03-20 NOTE — Therapy (Signed)
 OUTPATIENT PHYSICAL THERAPY  UPPER EXTREMITY ONCOLOGY   Patient Name: Margaret Mckenzie MRN: [id number] DOB:Oct 30, 1951, 73 y.o., female Today's Date: 03/20/2024  END OF SESSION:  PT End of Session - 03/20/24 1402     Visit Number 4    Number of Visits 9    Date for PT Re-Evaluation 03/25/24    PT Start Time 1400    PT Stop Time 1455    PT Time Calculation (min) 55 min    Activity Tolerance Patient tolerated treatment well    Behavior During Therapy WFL for tasks assessed/performed               Past Medical History:  Diagnosis Date   Anxiety    Bipolar 1 disorder (HCC)    Chicken pox    Depression    Hypertension    high blood pressure readings    Kidney disease    kidney stones and saw urologist, Dr. Patty Borg for lithotripsy remotely    PAF (paroxysmal atrial fibrillation) (HCC)    Personal history of radiation therapy    Past Surgical History:  Procedure Laterality Date   BREAST BIOPSY  10/31/2022   MM LT RADIOACTIVE SEED LOC MAMMO GUIDE 10/31/2022 [facility]   BREAST LUMPECTOMY Left 11/02/2022   BREAST LUMPECTOMY WITH RADIOACTIVE SEED LOCALIZATION Left 11/02/2022   Procedure: LEFT BREAST LUMPECTOMY WITH RADIOACTIVE SEED LOCALIZATION;  Surgeon: Sim Dryer, MD;  Location: [facility];  Service: General;  Laterality: Left;   BUNIONECTOMY  11/27/2005   TONSILLECTOMY AND ADENOIDECTOMY  11/27/1973   Patient Active Problem List   Diagnosis Date Noted   NASH (nonalcoholic steatohepatitis) 12/11/2022   Transaminitis 11/09/2022   Malignant neoplasm of upper-outer quadrant of left breast in female, estrogen receptor positive (HCC) 10/09/2022   Chronic atrial fibrillation (HCC) 05/08/2022   Hyponatremia 07/15/2021   Generalized anxiety disorder 10/25/2020   Panic disorder 10/25/2020   Bipolar I disorder (HCC) 10/25/2020   Insomnia 10/25/2020   Vitamin B12 deficiency 04/23/2020   Paroxysmal atrial fibrillation (HCC) 08/31/2017    Essential hypertension 08/31/2017    PCP: Marguerita Shih, MD  REFERRING PROVIDER: Sonja [place] Newton, MD  REFERRING DIAG: [phone] (ICD-10-CM) - Malignant neoplasm of upper-outer quadrant of left breast in female, estrogen receptor positive (HCC)  THERAPY DIAG:  Lymphedema, not elsewhere classified  Disorder of the skin and subcutaneous tissue related to radiation, unspecified  Malignant neoplasm of upper-outer quadrant of left breast in female, estrogen receptor positive (HCC)  ONSET DATE: 08/28/23  Rationale for Evaluation and Treatment: Rehabilitation  SUBJECTIVE:  SUBJECTIVE STATEMENT:  I have not tried self massage. I was prescribed prednisone  for my foot and I have an MRI on Monday.   EVAL: The swelling has been going on for about 6 months. I did not pay much attention to it. I thought it was a side effect of the radiation and would go away but it hasn't. It is red and hard.   PERTINENT HISTORY: 09/29/2022 diagnosed with L breast cancer, IDC, grade 2, ER/PR+, Ki67- 10%, L lumpectomy 11/02/22, completed radiation on 01/09/23, currently taking anastrozole , history of atrial fibrillation and bipolar   PAIN:  Are you having pain? Yes 4/10, top of R foot, walking increases pain, rest relieves it  PRECAUTIONS: R UE lymphedema risk  RED FLAGS: None   WEIGHT BEARING RESTRICTIONS: No  FALLS:  Has patient fallen in last 6 months? Yes. Number of falls 1 was in a hurry to get out of bed in the middle of the night and feet got tangled in the blankets and fell and fractured ribs  LIVING ENVIRONMENT: Lives with: lives alone Lives in: House/apartment Stairs: No;  Has following equipment at home: None  OCCUPATION: part time, server at [employer]   LEISURE: does not exercise  HAND  DOMINANCE: right   PRIOR LEVEL OF FUNCTION: Independent  PATIENT GOALS: to reduce the swelling   OBJECTIVE: Note: Objective measures were completed at Evaluation unless otherwise noted.  COGNITION: Overall cognitive status: Within functional limits for tasks assessed   PALPATION: Increased fibrosis throughout L breast, increased scar tissue at lateral L breast  OBSERVATIONS / OTHER ASSESSMENTS: L breast pulled upwards due to scar tissue and increased edema with fold at scar tissue at lateral breast  POSTURE: forward head, rounded shoulders  UPPER EXTREMITY AROM/PROM: WFL  LYMPHEDEMA ASSESSMENTS:   LANDMARK RIGHT  eval  At axilla  32  15 cm proximal to olecranon process 31.8  10 cm proximal to olecranon process 29  Olecranon process 25  15 cm proximal to ulnar styloid process 23.3  10 cm proximal to ulnar styloid process 20  Just proximal to ulnar styloid process 15.2  Across hand at thumb web space 18.5  At base of 2nd digit 6.2  (Blank rows = not tested)  LANDMARK LEFT  eval  At axilla  33  15 cm proximal to olecranon process 31  10 cm proximal to olecranon process 28  Olecranon process 25.2  15 cm proximal to ulnar styloid process 22.6  10 cm proximal to ulnar styloid process 19  Just proximal to ulnar styloid process 15.1  Across hand at thumb web space 18.2  At base of 2nd digit 6  (Blank rows = not tested)   BREAST COMPLAINTS SCALE:  BREAST COMPLAINTS QUESTIONNAIRE Pain:0 Heaviness:0 Swollen feeling:10 Tense Skin:3 Redness:10 Bra Print:0 Size of Pores:0 Hard feeling: 9 Total:   32  /80 A Score over 9 indicates lymphedema issues in the breast  TREATMENT DATE:  Pt permission and consent throughout each step of examination and treatment with modification and draping if requested when working on sensitive areas  03/20/24: Had pt  demonstrate each step as follows: In supine: Short neck, 5 diaphragmatic breaths, bil axillary nodes and establishment of interaxillary pathway, L inguinal nodes and establishment of axilloinguinal pathway, then L breast moving fluid towards pathways spending extra time in any areas of fibrosis then retracing all steps. Provided v/c and t/c for correct pressure, skin stretch technique, direction of stretch and sequence. Slightly less cueing required this session.  STM to the lateral breast and trunk in areas of scar tissue/fibrosis  03/18/24: Had pt demonstrate each step as follows: In supine: Short neck, 5 diaphragmatic breaths, bil axillary nodes and establishment of interaxillary pathway, L inguinal nodes and establishment of axilloinguinal pathway, then L breast moving fluid towards pathways spending extra time in any areas of fibrosis then retracing all steps. Provided v/c and t/c for correct pressure, skin stretch technique, direction of stretch and sequence.  STM to the lateral breast and trunk in areas of scar tissue/fibrosis  03/10/24: In supine: Short neck, 5 diaphragmatic breaths, bil axillary nodes and establishment of interaxillary pathway, L inguinal nodes and establishment of axilloinguinal pathway, then L breast moving fluid towards pathways spending extra time in any areas of fibrosis then retracing all steps. Educated pt in anatomy and physiology of the lymphatic system and basic MLD principles. Gave handout of all steps. Then PT also performed in sidelying posterior work and STM to the lateral breast and trunk STM to lateral breast and trunk  02/26/24: In supine: Short neck, 5 diaphragmatic breaths, R axillary nodes and establishment of interaxillary pathway, L inguinal nodes and establishment of axilloinguinal pathway, then L breast moving fluid towards pathways spending extra time in any areas of fibrosis then retracing all steps. Educated pt in anatomy and physiology of the lymphatic  system and basic MLD principles.  Gentle scar mobilization to scar Educated on need to obtain compression bra  PATIENT EDUCATION:  Education details: lymphedema, need for compression, anatomy and physiology of the lymphatic system Person educated: Patient Education method: Medical illustrator Education comprehension: verbalized understanding  HOME EXERCISE PROGRAM: Obtain a compression bra with maximal compression  ASSESSMENT:  CLINICAL IMPRESSION: Continued to educate pt in proper self MLD technique. Pt is still requiring v/c and t/c for skin stretch technique, sequence, and direction of stretch though slightly less cueing than last session. Her fibrosis improves by end of session and therapist spent extra time of STM to areas of fibrosis.   OBJECTIVE IMPAIRMENTS: decreased knowledge of condition, decreased knowledge of use of DME, increased edema, and increased fascial restrictions.   ACTIVITY LIMITATIONS:  none  PARTICIPATION LIMITATIONS:  none  PERSONAL FACTORS: Time since onset of injury/illness/exacerbation are also affecting patient's functional outcome.   REHAB POTENTIAL: Good  CLINICAL DECISION MAKING: Stable/uncomplicated  EVALUATION COMPLEXITY: Low  GOALS: Goals reviewed with patient? Yes  SHORT TERM GOALS=LONG TERM GOALS Target date: 03/25/24  Pt will be independent in self MLD for long term management of L breast lymphedema. Baseline: Goal status: INITIAL  2.  Pt will obtain an appropriate compression bra for long term management of lymphedema.  Baseline:  Goal status: INITIAL  3.  Pt will demonstrate a 50% improvement in fibrosis and swelling in L breast to decrease risk of cellulitis.  Baseline:  Goal status: INITIAL   PLAN:  PT FREQUENCY: 2x/week  PT DURATION: 4 weeks  PLANNED  INTERVENTIONS: 97164- PT Re-evaluation, 97110-Therapeutic exercises, 97530- Therapeutic activity, [id number]- Neuromuscular re-education, 97535- Self Care, [id number]-  Manual therapy, and 97760- Orthotic Fit/training  PLAN FOR NEXT SESSION: review self MLD for L breast (intact system)  Deedee Farmer, PT 03/20/2024, 2:58 PM

## 2024-03-24 ENCOUNTER — Encounter: Admitting: Rehabilitation

## 2024-03-24 DIAGNOSIS — M79671 Pain in right foot: Secondary | ICD-10-CM | POA: Diagnosis not present

## 2024-03-25 DIAGNOSIS — M47816 Spondylosis without myelopathy or radiculopathy, lumbar region: Secondary | ICD-10-CM | POA: Diagnosis not present

## 2024-03-26 ENCOUNTER — Ambulatory Visit: Admitting: Rehabilitation

## 2024-03-26 ENCOUNTER — Encounter: Payer: Self-pay | Admitting: Rehabilitation

## 2024-03-26 DIAGNOSIS — L599 Disorder of the skin and subcutaneous tissue related to radiation, unspecified: Secondary | ICD-10-CM

## 2024-03-26 DIAGNOSIS — C50412 Malignant neoplasm of upper-outer quadrant of left female breast: Secondary | ICD-10-CM

## 2024-03-26 DIAGNOSIS — I890 Lymphedema, not elsewhere classified: Secondary | ICD-10-CM | POA: Diagnosis not present

## 2024-03-26 NOTE — Therapy (Signed)
 OUTPATIENT PHYSICAL THERAPY  UPPER EXTREMITY ONCOLOGY   Patient Name: Margaret Mckenzie MRN: [id number] DOB:01-Nov-1951, 73 y.o., female Today's Date: 03/26/2024  END OF SESSION:  PT End of Session - 03/26/24 1358     Visit Number 5    Number of Visits 13    Date for PT Re-Evaluation 04/23/24    Authorization Type not needed    PT Start Time 1402    PT Stop Time 1448    PT Time Calculation (min) 46 min    Activity Tolerance Patient tolerated treatment well    Behavior During Therapy WFL for tasks assessed/performed               Past Medical History:  Diagnosis Date   Anxiety    Bipolar 1 disorder (HCC)    Chicken pox    Depression    Hypertension    high blood pressure readings    Kidney disease    kidney stones and saw urologist, Dr. Patty Borg for lithotripsy remotely    PAF (paroxysmal atrial fibrillation) (HCC)    Personal history of radiation therapy    Past Surgical History:  Procedure Laterality Date   BREAST BIOPSY  10/31/2022   MM LT RADIOACTIVE SEED LOC MAMMO GUIDE 10/31/2022 [facility]   BREAST LUMPECTOMY Left 11/02/2022   BREAST LUMPECTOMY WITH RADIOACTIVE SEED LOCALIZATION Left 11/02/2022   Procedure: LEFT BREAST LUMPECTOMY WITH RADIOACTIVE SEED LOCALIZATION;  Surgeon: Sim Dryer, MD;  Location: [facility];  Service: General;  Laterality: Left;   BUNIONECTOMY  11/27/2005   TONSILLECTOMY AND ADENOIDECTOMY  11/27/1973   Patient Active Problem List   Diagnosis Date Noted   NASH (nonalcoholic steatohepatitis) 12/11/2022   Transaminitis 11/09/2022   Malignant neoplasm of upper-outer quadrant of left breast in female, estrogen receptor positive (HCC) 10/09/2022   Chronic atrial fibrillation (HCC) 05/08/2022   Hyponatremia 07/15/2021   Generalized anxiety disorder 10/25/2020   Panic disorder 10/25/2020   Bipolar I disorder (HCC) 10/25/2020   Insomnia 10/25/2020   Vitamin B12 deficiency 04/23/2020   Paroxysmal atrial  fibrillation (HCC) 08/31/2017   Essential hypertension 08/31/2017    PCP: Marguerita Shih, MD  REFERRING PROVIDER: Sonja [place], MD  REFERRING DIAG: [phone] (ICD-10-CM) - Malignant neoplasm of upper-outer quadrant of left breast in female, estrogen receptor positive (HCC)  THERAPY DIAG:  Lymphedema, not elsewhere classified  Disorder of the skin and subcutaneous tissue related to radiation, unspecified  Malignant neoplasm of upper-outer quadrant of left breast in female, estrogen receptor positive (HCC)  ONSET DATE: 08/28/23  Rationale for Evaluation and Treatment: Rehabilitation  SUBJECTIVE:  SUBJECTIVE STATEMENT:  Its getting better.  Its starting to droop a little bit.  My back was really bad today 10/10.  I am doing my self massage every day.    EVAL: The swelling has been going on for about 6 months. I did not pay much attention to it. I thought it was a side effect of the radiation and would go away but it hasn't. It is red and hard.   PERTINENT HISTORY: 09/29/2022 diagnosed with L breast cancer, IDC, grade 2, ER/PR+, Ki67- 10%, L lumpectomy 11/02/22, completed radiation on 01/09/23, currently taking anastrozole , history of atrial fibrillation and bipolar   PAIN:  Are you having pain? Yes 9/10 low back   PRECAUTIONS: none  RED FLAGS: None   WEIGHT BEARING RESTRICTIONS: No  FALLS:  Has patient fallen in last 6 months? Yes. Number of falls 1 was in a hurry to get out of bed in the middle of the night and feet got tangled in the blankets and fell and fractured ribs  LIVING ENVIRONMENT: Lives with: lives alone Lives in: House/apartment Stairs: No;  Has following equipment at home: None  OCCUPATION: part time, server at [employer]   LEISURE: does not exercise  HAND  DOMINANCE: right   PRIOR LEVEL OF FUNCTION: Independent  PATIENT GOALS: to reduce the swelling   OBJECTIVE: Note: Objective measures were completed at Evaluation unless otherwise noted.  COGNITION: Overall cognitive status: Within functional limits for tasks assessed   PALPATION: Increased fibrosis throughout L breast, increased scar tissue at lateral L breast  OBSERVATIONS / OTHER ASSESSMENTS: L breast pulled upwards due to scar tissue and increased edema with fold at scar tissue at lateral breast  POSTURE: forward head, rounded shoulders  UPPER EXTREMITY AROM/PROM: WFL  LYMPHEDEMA ASSESSMENTS:   LANDMARK RIGHT  eval  At axilla  32  15 cm proximal to olecranon process 31.8  10 cm proximal to olecranon process 29  Olecranon process 25  15 cm proximal to ulnar styloid process 23.3  10 cm proximal to ulnar styloid process 20  Just proximal to ulnar styloid process 15.2  Across hand at thumb web space 18.5  At base of 2nd digit 6.2  (Blank rows = not tested)  LANDMARK LEFT  eval  At axilla  33  15 cm proximal to olecranon process 31  10 cm proximal to olecranon process 28  Olecranon process 25.2  15 cm proximal to ulnar styloid process 22.6  10 cm proximal to ulnar styloid process 19  Just proximal to ulnar styloid process 15.1  Across hand at thumb web space 18.2  At base of 2nd digit 6  (Blank rows = not tested)   BREAST COMPLAINTS SCALE:  BREAST COMPLAINTS QUESTIONNAIRE    EVAL  03/26/24 Pain   0  0 Heaviness:  0  0 Swollen feeling: 10  7 Tense Skin:  3  2 Redness:  10  5 Bra Print:  0  0 Size of Pores:  0  0 Hard feeling:   9  5 Total:      32 /80  19/20 A Score over 9 indicates lymphedema issues in the breast  TREATMENT DATE:  Pt permission and consent throughout each step of examination and treatment with modification and  draping if requested when working on sensitive areas  03/26/24: In supine: Short neck, 5 diaphragmatic breaths, bil axillary nodes and establishment of interaxillary pathway, L inguinal nodes and establishment of axilloinguinal pathway, then L breast moving fluid towards pathways spending extra time in any areas of fibrosis then retracing all steps.   STM to the lateral breast and trunk in areas of scar tissue/fibrosis  03/20/24: Had pt demonstrate each step as follows: In supine: Short neck, 5 diaphragmatic breaths, bil axillary nodes and establishment of interaxillary pathway, L inguinal nodes and establishment of axilloinguinal pathway, then L breast moving fluid towards pathways spending extra time in any areas of fibrosis then retracing all steps. Provided v/c and t/c for correct pressure, skin stretch technique, direction of stretch and sequence. Slightly less cueing required this session.  STM to the lateral breast and trunk in areas of scar tissue/fibrosis  03/18/24: Had pt demonstrate each step as follows: In supine: Short neck, 5 diaphragmatic breaths, bil axillary nodes and establishment of interaxillary pathway, L inguinal nodes and establishment of axilloinguinal pathway, then L breast moving fluid towards pathways spending extra time in any areas of fibrosis then retracing all steps. Provided v/c and t/c for correct pressure, skin stretch technique, direction of stretch and sequence.  STM to the lateral breast and trunk in areas of scar tissue/fibrosis  03/10/24: In supine: Short neck, 5 diaphragmatic breaths, bil axillary nodes and establishment of interaxillary pathway, L inguinal nodes and establishment of axilloinguinal pathway, then L breast moving fluid towards pathways spending extra time in any areas of fibrosis then retracing all steps. Educated pt in anatomy and physiology of the lymphatic system and basic MLD principles. Gave handout of all steps. Then PT also performed in sidelying  posterior work and STM to the lateral breast and trunk STM to lateral breast and trunk  02/26/24: In supine: Short neck, 5 diaphragmatic breaths, R axillary nodes and establishment of interaxillary pathway, L inguinal nodes and establishment of axilloinguinal pathway, then L breast moving fluid towards pathways spending extra time in any areas of fibrosis then retracing all steps. Educated pt in anatomy and physiology of the lymphatic system and basic MLD principles.  Gentle scar mobilization to scar Educated on need to obtain compression bra  PATIENT EDUCATION:  Education details: lymphedema, need for compression, anatomy and physiology of the lymphatic system Person educated: Patient Education method: Medical illustrator Education comprehension: verbalized understanding  HOME EXERCISE PROGRAM: Obtain a compression bra with maximal compression  ASSESSMENT:  CLINICAL IMPRESSION: Continued MLD to the left breast.  Reassessed goals and breast edema questionnaire to extend POC.  Pt has decreased the score from 32 to 19 /80 demonstrating improvements.  She is noting that the breast is softer overall.  Will continue POC 2x per week x 4 more weeks. As she has not yet reached the 50% improvement mark.   OBJECTIVE IMPAIRMENTS: decreased knowledge of condition, decreased knowledge of use of DME, increased edema, and increased fascial restrictions.   ACTIVITY LIMITATIONS:  none  PARTICIPATION LIMITATIONS:  none  PERSONAL FACTORS: Time since onset of injury/illness/exacerbation are also affecting patient's functional outcome.   REHAB POTENTIAL: Good  CLINICAL DECISION MAKING: Stable/uncomplicated  EVALUATION COMPLEXITY: Low  GOALS: Goals reviewed with patient? Yes  SHORT TERM GOALS=LONG TERM GOALS Target date: 03/25/24  Pt will be independent in self MLD for long term management of L breast  lymphedema. Baseline: Goal status: MET   2.  Pt will obtain an appropriate compression  bra for long term management of lymphedema.  Baseline:  Goal status: MET  3.  Pt will demonstrate a 50% improvement in fibrosis and swelling in L breast to decrease risk of cellulitis.  Baseline:  Goal status: ONGOING - went from 10/10 to 7/10 above.     PLAN:  PT FREQUENCY: 2x/week  PT DURATION: 4 weeks  PLANNED INTERVENTIONS: 97164- PT Re-evaluation, 97110-Therapeutic exercises, 97530- Therapeutic activity, [id number]- Neuromuscular re-education, 97535- Self Care, [id number]- Manual therapy, and 97760- Orthotic Fit/training  PLAN FOR NEXT SESSION: review self MLD for L breast (intact system)  Shelsy Seng, Durward Gillis, PT 03/26/2024, 2:56 PM

## 2024-03-27 ENCOUNTER — Ambulatory Visit: Admitting: Physical Therapy

## 2024-03-30 ENCOUNTER — Other Ambulatory Visit: Payer: Self-pay | Admitting: Cardiology

## 2024-03-30 DIAGNOSIS — I480 Paroxysmal atrial fibrillation: Secondary | ICD-10-CM

## 2024-03-31 ENCOUNTER — Ambulatory Visit: Attending: Hematology

## 2024-03-31 DIAGNOSIS — L599 Disorder of the skin and subcutaneous tissue related to radiation, unspecified: Secondary | ICD-10-CM

## 2024-03-31 DIAGNOSIS — Z170 Estrogen receptor positive status [ER+]: Secondary | ICD-10-CM | POA: Diagnosis not present

## 2024-03-31 DIAGNOSIS — I890 Lymphedema, not elsewhere classified: Secondary | ICD-10-CM

## 2024-03-31 DIAGNOSIS — C50412 Malignant neoplasm of upper-outer quadrant of left female breast: Secondary | ICD-10-CM | POA: Diagnosis not present

## 2024-03-31 NOTE — Telephone Encounter (Signed)
 Prescription refill request for Eliquis  received. Indication:afib Last office visit:9/24 Scr:0.55  2/25 Age: 73 Weight:68.9  kg  Prescription refilled

## 2024-03-31 NOTE — Therapy (Signed)
 OUTPATIENT PHYSICAL THERAPY  UPPER EXTREMITY ONCOLOGY TREATMENT  Patient Name: Margaret Mckenzie MRN: [id number] DOB:[birth year], 73 y.o., female Today's Date: 03/31/2024  END OF SESSION:  PT End of Session - 03/31/24 1407     Visit Number 6    Number of Visits 13    Date for PT Re-Evaluation 04/23/24    Authorization Type not needed    PT Start Time 1404    PT Stop Time 1458    PT Time Calculation (min) 54 min    Activity Tolerance Patient tolerated treatment well    Behavior During Therapy WFL for tasks assessed/performed               Past Medical History:  Diagnosis Date   Anxiety    Bipolar 1 disorder (HCC)    Chicken pox    Depression    Hypertension    high blood pressure readings    Kidney disease    kidney stones and saw urologist, Dr. Patty Borg for lithotripsy remotely    PAF (paroxysmal atrial fibrillation) (HCC)    Personal history of radiation therapy    Past Surgical History:  Procedure Laterality Date   BREAST BIOPSY  10/31/2022   MM LT RADIOACTIVE SEED LOC MAMMO GUIDE 10/31/2022 [facility]   BREAST LUMPECTOMY Left 11/02/2022   BREAST LUMPECTOMY WITH RADIOACTIVE SEED LOCALIZATION Left 11/02/2022   Procedure: LEFT BREAST LUMPECTOMY WITH RADIOACTIVE SEED LOCALIZATION;  Surgeon: Sim Dryer, MD;  Location: [facility];  Service: General;  Laterality: Left;   BUNIONECTOMY  11/27/2005   TONSILLECTOMY AND ADENOIDECTOMY  11/27/1973   Patient Active Problem List   Diagnosis Date Noted   NASH (nonalcoholic steatohepatitis) 12/11/2022   Transaminitis 11/09/2022   Malignant neoplasm of upper-outer quadrant of left breast in female, estrogen receptor positive (HCC) 10/09/2022   Chronic atrial fibrillation (HCC) 05/08/2022   Hyponatremia 07/15/2021   Generalized anxiety disorder 10/25/2020   Panic disorder 10/25/2020   Bipolar I disorder (HCC) 10/25/2020   Insomnia 10/25/2020   Vitamin B12 deficiency 04/23/2020   Paroxysmal  atrial fibrillation (HCC) 08/31/2017   Essential hypertension 08/31/2017    PCP: Marguerita Shih, MD  REFERRING PROVIDER: Sonja Itasca, MD  REFERRING DIAG: [phone] (ICD-10-CM) - Malignant neoplasm of upper-outer quadrant of left breast in female, estrogen receptor positive (HCC)  THERAPY DIAG:  Lymphedema, not elsewhere classified  Disorder of the skin and subcutaneous 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 I didn't get to do the self MLD this weekend though because I was sick.   EVAL: The swelling has been going on for about 6 months. I did not pay much attention to it. I thought it was a side effect of the radiation and would go away but it hasn't. It is red and hard.   PERTINENT HISTORY: 09/29/2022 diagnosed with L breast cancer, IDC, grade 2, ER/PR+, Ki67- 10%, L lumpectomy 11/02/22, completed radiation on 01/09/23, currently taking anastrozole , history of atrial fibrillation and bipolar   PAIN:  Are you having pain? Yes 1/10 low back   PRECAUTIONS: none  RED FLAGS: None   WEIGHT BEARING RESTRICTIONS: No  FALLS:  Has patient fallen in last 6 months? Yes. 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  COGNITION: Overall cognitive status: Within functional limits for tasks assessed   PALPATION: Increased fibrosis throughout L breast, increased scar tissue at lateral L breast  OBSERVATIONS / OTHER ASSESSMENTS: L breast pulled upwards due to scar tissue and increased edema with fold at scar tissue at lateral breast  POSTURE: forward head, rounded shoulders  UPPER EXTREMITY AROM/PROM: WFL  LYMPHEDEMA ASSESSMENTS:   LANDMARK RIGHT  eval  At axilla  32  15 cm proximal to olecranon process 31.8  10 cm proximal to olecranon process 29  Olecranon process 25  15 cm proximal to ulnar styloid process 23.3  10 cm proximal to ulnar styloid process 20  Just proximal to ulnar styloid process 15.2  Across hand at thumb web space 18.5  At base of 2nd digit 6.2  (Blank rows = not tested)  LANDMARK LEFT  eval  At axilla  33  15 cm proximal to olecranon process 31  10 cm proximal to olecranon process 28  Olecranon process 25.2  15 cm proximal to ulnar styloid process 22.6  10 cm proximal to ulnar styloid process 19  Just proximal to ulnar styloid process 15.1  Across hand at thumb web space 18.2  At base of 2nd digit 6  (Blank rows = not tested)   BREAST COMPLAINTS SCALE:  BREAST COMPLAINTS QUESTIONNAIRE    EVAL  03/26/24 Pain   0  0 Heaviness:  0  0 Swollen feeling: 10  7 Tense Skin:  3  2 Redness:  10  5 Bra Print:  0  0 Size of Pores:  0  0 Hard feeling:   9  5 Total:      32 /80  19/20 A Score over 9 indicates lymphedema issues in the breast  TREATMENT DATE:  Pt permission and consent throughout each step of examination and treatment with modification and draping if requested  when working on sensitive areas  03/31/24: Manual Therapy In supine: Short neck, 5 diaphragmatic breaths, Rt axillary nodes and establishment of anterior inter-axillary pathway, Lt inguinal nodes and establishment of Lt axillo-inguinal pathway, then L breast moving fluid towards pathways, then into Lt S/L for focus to lateral breast redirecting towards lateral anastomosis and spent time educating pt about how this position is easier to reach lateral breast and had her return demo here, then finished retracing all steps in supine spending extra time in any areas of fibrosis.   STM to the lateral breast and trunk in areas of scar tissue/radiation fibrosis  03/26/24: In supine: Short neck, 5 diaphragmatic breaths, bil axillary nodes and establishment of interaxillary pathway, L inguinal nodes and establishment of axilloinguinal pathway, then L breast moving fluid towards pathways spending extra time in any areas of fibrosis then retracing all steps.   STM to the lateral breast and trunk in areas of scar tissue/fibrosis  03/20/24: Had pt demonstrate each step as follows: In supine: Short neck, 5 diaphragmatic breaths, bil axillary nodes and establishment of interaxillary pathway, L inguinal nodes and establishment of axilloinguinal pathway, then L breast moving fluid towards pathways spending extra time in any areas of fibrosis then retracing all steps. Provided v/c and t/c for correct pressure, skin stretch technique, direction of stretch and sequence. Slightly less cueing required this session.  STM to the lateral breast and trunk in areas of scar tissue/fibrosis  03/18/24: Had pt demonstrate each step as follows: In supine: Short neck, 5 diaphragmatic breaths, bil axillary nodes and establishment of interaxillary pathway, L inguinal nodes and establishment of axilloinguinal pathway, then L breast moving fluid towards pathways spending extra time in any areas of fibrosis then retracing all steps. Provided v/c  and t/c for correct pressure, skin stretch technique, direction of stretch and sequence.  STM to the lateral breast and trunk in areas of scar tissue/fibrosis  03/10/24: In supine: Short neck, 5 diaphragmatic breaths, bil axillary nodes and establishment of interaxillary pathway, L inguinal nodes and establishment of axilloinguinal pathway, then L breast moving fluid towards pathways spending extra time in any areas of fibrosis then retracing all steps. Educated pt in anatomy and physiology of the lymphatic system and basic MLD principles. Gave handout of all steps. Then PT also performed in sidelying posterior work and STM to the lateral breast and trunk STM to lateral breast and trunk  02/26/24: In supine: Short neck, 5 diaphragmatic breaths, R axillary nodes and establishment of interaxillary pathway, L inguinal nodes and establishment of axilloinguinal pathway, then L breast moving fluid towards pathways spending extra time in any areas of fibrosis then retracing all steps. Educated pt in anatomy and physiology of the lymphatic system and basic MLD principles.  Gentle scar mobilization to scar Educated on need to obtain compression bra  PATIENT EDUCATION:  Education details: lymphedema, need for compression, anatomy and physiology of the lymphatic system Person educated: Patient Education method: Medical illustrator Education comprehension: verbalized understanding  HOME EXERCISE PROGRAM: Obtain a compression bra with maximal compression  ASSESSMENT:  CLINICAL IMPRESSION: Continued with Lt breast MLD. Instructed pt in how S/L can be more beneficial to reach lateral breast and had her return demo here. Some softening noted at fibrotic area, worse at incision  at lateral breast.   OBJECTIVE IMPAIRMENTS: decreased knowledge of condition, decreased knowledge of use of  DME, increased edema, and increased fascial restrictions.   ACTIVITY LIMITATIONS:  none  PARTICIPATION LIMITATIONS:   none  PERSONAL FACTORS: Time since onset of injury/illness/exacerbation are also affecting patient's functional outcome.   REHAB POTENTIAL: Good  CLINICAL DECISION MAKING: Stable/uncomplicated  EVALUATION COMPLEXITY: Low  GOALS: Goals reviewed with patient? Yes  SHORT TERM GOALS=LONG TERM GOALS Target date: 03/25/24  Pt will be independent in self MLD for long term management of L breast lymphedema. Baseline: Goal status: MET   2.  Pt will obtain an appropriate compression bra for long term management of lymphedema.  Baseline:  Goal status: MET  3.  Pt will demonstrate a 50% improvement in fibrosis and swelling in L breast to decrease risk of cellulitis.  Baseline:  Goal status: ONGOING - went from 10/10 to 7/10 above.     PLAN:  PT FREQUENCY: 2x/week  PT DURATION: 4 weeks  PLANNED INTERVENTIONS: 97164- PT Re-evaluation, 97110-Therapeutic exercises, 97530- Therapeutic activity, [id number]- Neuromuscular re-education, 97535- Self Care, 40981- Manual therapy, and 97760- Orthotic Fit/training  PLAN FOR NEXT SESSION: review self MLD for L breast (intact system)  Denyce Flank, PTA 03/31/2024, 3:01 PM

## 2024-04-03 ENCOUNTER — Encounter: Admitting: Physical Therapy

## 2024-04-03 DIAGNOSIS — M47816 Spondylosis without myelopathy or radiculopathy, lumbar region: Secondary | ICD-10-CM | POA: Diagnosis not present

## 2024-04-07 DIAGNOSIS — M79671 Pain in right foot: Secondary | ICD-10-CM | POA: Diagnosis not present

## 2024-04-08 ENCOUNTER — Ambulatory Visit: Admitting: Physical Therapy

## 2024-04-08 ENCOUNTER — Encounter: Payer: Self-pay | Admitting: Physical Therapy

## 2024-04-08 DIAGNOSIS — C50412 Malignant neoplasm of upper-outer quadrant of left female breast: Secondary | ICD-10-CM

## 2024-04-08 DIAGNOSIS — I890 Lymphedema, not elsewhere classified: Secondary | ICD-10-CM | POA: Diagnosis not present

## 2024-04-08 DIAGNOSIS — L599 Disorder of the skin and subcutaneous tissue related to radiation, unspecified: Secondary | ICD-10-CM

## 2024-04-08 NOTE — Therapy (Signed)
 OUTPATIENT PHYSICAL THERAPY  UPPER EXTREMITY ONCOLOGY TREATMENT  Patient Name: Margaret Mckenzie MRN: [id number] DOB:07/09/1951, 73 y.o., female Today's Date: 04/08/2024  END OF SESSION:  PT End of Session - 04/08/24 1402     Visit Number 7    Number of Visits 13    Date for PT Re-Evaluation 04/23/24    Authorization Type not needed    PT Start Time 1402    PT Stop Time 1454    PT Time Calculation (min) 52 min    Activity Tolerance Patient tolerated treatment well    Behavior During Therapy WFL for tasks assessed/performed               Past Medical History:  Diagnosis Date   Anxiety    Bipolar 1 disorder (HCC)    Chicken pox    Depression    Hypertension    high blood pressure readings    Kidney disease    kidney stones and saw urologist, Dr. Patty Borg for lithotripsy remotely    PAF (paroxysmal atrial fibrillation) (HCC)    Personal history of radiation therapy    Past Surgical History:  Procedure Laterality Date   BREAST BIOPSY  10/31/2022   MM LT RADIOACTIVE SEED LOC MAMMO GUIDE 10/31/2022 [facility]   BREAST LUMPECTOMY Left 11/02/2022   BREAST LUMPECTOMY WITH RADIOACTIVE SEED LOCALIZATION Left 11/02/2022   Procedure: LEFT BREAST LUMPECTOMY WITH RADIOACTIVE SEED LOCALIZATION;  Surgeon: Sim Dryer, MD;  Location: [facility];  Service: General;  Laterality: Left;   BUNIONECTOMY  11/27/2005   TONSILLECTOMY AND ADENOIDECTOMY  11/27/1973   Patient Active Problem List   Diagnosis Date Noted   NASH (nonalcoholic steatohepatitis) 12/11/2022   Transaminitis 11/09/2022   Malignant neoplasm of upper-outer quadrant of left breast in female, estrogen receptor positive (HCC) 10/09/2022   Chronic atrial fibrillation (HCC) 05/08/2022   Hyponatremia 07/15/2021   Generalized anxiety disorder 10/25/2020   Panic disorder 10/25/2020   Bipolar I disorder (HCC) 10/25/2020   Insomnia 10/25/2020   Vitamin B12 deficiency 04/23/2020   Paroxysmal  atrial fibrillation (HCC) 08/31/2017   Essential hypertension 08/31/2017    PCP: Marguerita Shih, MD  REFERRING PROVIDER: Sonja Fincastle, MD  REFERRING DIAG: [phone] (ICD-10-CM) - Malignant neoplasm of upper-outer quadrant of left breast in female, estrogen receptor positive (HCC)  THERAPY DIAG:  Lymphedema, not elsewhere classified  Disorder of the skin and subcutaneous tissue related to radiation, unspecified  Malignant neoplasm of upper-outer quadrant of left breast in female, estrogen receptor positive (HCC)  ONSET DATE: 08/28/23  Rationale for Evaluation and Treatment: Rehabilitation  SUBJECTIVE:  SUBJECTIVE STATEMENT:  The swelling is getting better. My foot is fine now.   EVAL: The swelling has been going on for about 6 months. I did not pay much attention to it. I thought it was a side effect of the radiation and would go away but it hasn't. It is red and hard.   PERTINENT HISTORY: 09/29/2022 diagnosed with L breast cancer, IDC, grade 2, ER/PR+, Ki67- 10%, L lumpectomy 11/02/22, completed radiation on 01/09/23, currently taking anastrozole , history of atrial fibrillation and bipolar   PAIN:  Are you having pain? Yes 5/10 low back   PRECAUTIONS: none  RED FLAGS: None   WEIGHT BEARING RESTRICTIONS: No  FALLS:  Has patient fallen in last 6 months? Yes. 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  COGNITION: Overall cognitive status: Within functional limits for tasks assessed   PALPATION: Increased fibrosis throughout L breast, increased scar tissue at lateral L breast  OBSERVATIONS / OTHER ASSESSMENTS: L breast pulled upwards due to scar tissue and increased edema with fold at scar tissue at lateral breast  POSTURE: forward head, rounded shoulders  UPPER EXTREMITY AROM/PROM: WFL  LYMPHEDEMA ASSESSMENTS:   LANDMARK RIGHT  eval  At axilla  32  15 cm proximal to olecranon process 31.8  10 cm proximal to olecranon process 29  Olecranon process 25  15 cm proximal to ulnar styloid process 23.3  10 cm proximal to ulnar styloid process 20  Just proximal to ulnar styloid process 15.2  Across hand at thumb web space 18.5  At base of 2nd digit 6.2  (Blank rows = not tested)  LANDMARK LEFT  eval  At axilla  33  15 cm proximal to olecranon process 31  10 cm proximal to olecranon process 28  Olecranon process 25.2  15 cm proximal to ulnar styloid process 22.6  10 cm proximal to ulnar styloid process 19  Just proximal to ulnar styloid process 15.1  Across hand at thumb web space 18.2  At base of 2nd digit 6  (Blank rows = not tested)   BREAST COMPLAINTS SCALE:  BREAST COMPLAINTS QUESTIONNAIRE    EVAL  03/26/24 Pain   0  0 Heaviness:  0  0 Swollen feeling: 10  7 Tense Skin:  3  2 Redness:  10  5 Bra Print:  0  0 Size of Pores:  0  0 Hard feeling:   9  5 Total:      32 /80  19/20 A Score over 9 indicates lymphedema issues in the breast  TREATMENT DATE:  Pt permission and consent throughout each step of examination and treatment with modification and draping if requested when working on sensitive areas 04/08/24: Manual  Therapy In supine: Short neck, 5 diaphragmatic breaths, Rt axillary nodes and establishment of anterior inter-axillary pathway, Lt inguinal nodes and establishment of Lt axillo-inguinal pathway, then L breast moving fluid towards pathways then finished retracing all steps spending extra time in any areas of fibrosis.   STM to the lateral breast and trunk in areas of scar tissue/radiation fibrosis  03/31/24: Manual Therapy In supine: Short neck, 5 diaphragmatic breaths, Rt axillary nodes and establishment of anterior inter-axillary pathway, Lt inguinal nodes and establishment of Lt axillo-inguinal pathway, then L breast moving fluid towards pathways, then into Lt S/L for focus to lateral breast redirecting towards lateral anastomosis and spent time educating pt about how this position is easier to reach lateral breast and had her return demo here, then finished retracing all steps in supine spending extra time in any areas of fibrosis.   STM to the lateral breast and trunk in areas of scar tissue/radiation fibrosis  03/26/24: In supine: Short neck, 5 diaphragmatic breaths, bil axillary nodes and establishment of interaxillary pathway, L inguinal nodes and establishment of axilloinguinal pathway, then L breast moving fluid towards pathways spending extra time in any areas of fibrosis then retracing all steps.   STM to the lateral breast and trunk in areas of scar tissue/fibrosis  03/20/24: Had pt demonstrate each step as follows: In supine: Short neck, 5 diaphragmatic breaths, bil axillary nodes and establishment of interaxillary pathway, L inguinal nodes and establishment of axilloinguinal pathway, then L breast moving fluid towards pathways spending extra time in any areas of fibrosis then retracing all steps. Provided v/c and t/c for correct pressure, skin stretch technique, direction of stretch and sequence. Slightly less cueing required this session.  STM to the lateral breast and trunk in areas of scar  tissue/fibrosis  03/18/24: Had pt demonstrate each step as follows: In supine: Short neck, 5 diaphragmatic breaths, bil axillary nodes and establishment of interaxillary pathway, L inguinal nodes and establishment of axilloinguinal pathway, then L breast moving fluid towards pathways spending extra time in any areas of fibrosis then retracing all steps. Provided v/c and t/c for correct pressure, skin stretch technique, direction of stretch and sequence.  STM to the lateral breast and trunk in areas of scar tissue/fibrosis  03/10/24: In supine: Short neck, 5 diaphragmatic breaths, bil axillary nodes and establishment of interaxillary pathway, L inguinal nodes and establishment of axilloinguinal pathway, then L breast moving fluid towards pathways spending extra time in any areas of fibrosis then retracing all steps. Educated pt in anatomy and physiology of the lymphatic system and basic MLD principles. Gave handout of all steps. Then PT also performed in sidelying posterior work and STM to the lateral breast and trunk STM to lateral breast and trunk  02/26/24: In supine: Short neck, 5 diaphragmatic breaths, R axillary nodes and establishment of interaxillary pathway, L inguinal nodes and establishment of axilloinguinal pathway, then L breast moving fluid towards pathways spending extra time in any areas of fibrosis then retracing all steps. Educated pt in anatomy and physiology of the lymphatic system and basic MLD principles.  Gentle scar mobilization to scar Educated on need to obtain compression bra  PATIENT EDUCATION:  Education details: lymphedema, need for compression, anatomy and physiology of the lymphatic system Person educated: Patient Education method: Explanation and Demonstration Education comprehension: verbalized understanding  HOME EXERCISE  PROGRAM: Obtain a compression bra with maximal compression  ASSESSMENT:  CLINICAL IMPRESSION: Continued with Lt breast MLD. Fibrosis is  improving in L breast and there is more tissue mobility. Her scar tissue is also softening.   OBJECTIVE IMPAIRMENTS: decreased knowledge of condition, decreased knowledge of use of DME, increased edema, and increased fascial restrictions.   ACTIVITY LIMITATIONS: none  PARTICIPATION LIMITATIONS: none  PERSONAL FACTORS: Time since onset of injury/illness/exacerbation are also affecting patient's functional outcome.   REHAB POTENTIAL: Good  CLINICAL DECISION MAKING: Stable/uncomplicated  EVALUATION COMPLEXITY: Low  GOALS: Goals reviewed with patient? Yes  SHORT TERM GOALS=LONG TERM GOALS Target date: 03/25/24  Pt will be independent in self MLD for long term management of L breast lymphedema. Baseline: Goal status: MET   2.  Pt will obtain an appropriate compression bra for long term management of lymphedema.  Baseline:  Goal status: MET  3.  Pt will demonstrate a 50% improvement in fibrosis and swelling in L breast to decrease risk of cellulitis.  Baseline:  Goal status: ONGOING - went from 10/10 to 7/10 above.     PLAN:  PT FREQUENCY: 2x/week  PT DURATION: 4 weeks  PLANNED INTERVENTIONS: 97164- PT Re-evaluation, 97110-Therapeutic exercises, 97530- Therapeutic activity, [id number]- Neuromuscular re-education, 97535- Self Care, 38101- Manual therapy, and 97760- Orthotic Fit/training  PLAN FOR NEXT SESSION: review self MLD for L breast (intact system)  Deedee Farmer, PT 04/08/2024, 2:59 PM

## 2024-04-10 ENCOUNTER — Ambulatory Visit: Admitting: Physical Therapy

## 2024-04-10 ENCOUNTER — Encounter: Payer: Self-pay | Admitting: Physical Therapy

## 2024-04-10 DIAGNOSIS — C50412 Malignant neoplasm of upper-outer quadrant of left female breast: Secondary | ICD-10-CM

## 2024-04-10 DIAGNOSIS — I890 Lymphedema, not elsewhere classified: Secondary | ICD-10-CM | POA: Diagnosis not present

## 2024-04-10 DIAGNOSIS — L599 Disorder of the skin and subcutaneous tissue related to radiation, unspecified: Secondary | ICD-10-CM

## 2024-04-10 NOTE — Therapy (Signed)
 OUTPATIENT PHYSICAL THERAPY  UPPER EXTREMITY ONCOLOGY TREATMENT  Patient Name: Margaret Mckenzie MRN: [id number] DOB:10/29/1951, 73 y.o., female Today's Date: 04/10/2024  END OF SESSION:  PT End of Session - 04/10/24 1403     Visit Number 8    Number of Visits 13    Date for PT Re-Evaluation 04/23/24    Authorization Type not needed    PT Start Time 1400    PT Stop Time 1454    PT Time Calculation (min) 54 min    Activity Tolerance Patient tolerated treatment well    Behavior During Therapy WFL for tasks assessed/performed               Past Medical History:  Diagnosis Date   Anxiety    Bipolar 1 disorder (HCC)    Chicken pox    Depression    Hypertension    high blood pressure readings    Kidney disease    kidney stones and saw urologist, Dr. Patty Borg for lithotripsy remotely    PAF (paroxysmal atrial fibrillation) (HCC)    Personal history of radiation therapy    Past Surgical History:  Procedure Laterality Date   BREAST BIOPSY  10/31/2022   MM LT RADIOACTIVE SEED LOC MAMMO GUIDE 10/31/2022 [facility]   BREAST LUMPECTOMY Left 11/02/2022   BREAST LUMPECTOMY WITH RADIOACTIVE SEED LOCALIZATION Left 11/02/2022   Procedure: LEFT BREAST LUMPECTOMY WITH RADIOACTIVE SEED LOCALIZATION;  Surgeon: Sim Dryer, MD;  Location: [facility];  Service: General;  Laterality: Left;   BUNIONECTOMY  11/27/2005   TONSILLECTOMY AND ADENOIDECTOMY  11/27/1973   Patient Active Problem List   Diagnosis Date Noted   NASH (nonalcoholic steatohepatitis) 12/11/2022   Transaminitis 11/09/2022   Malignant neoplasm of upper-outer quadrant of left breast in female, estrogen receptor positive (HCC) 10/09/2022   Chronic atrial fibrillation (HCC) 05/08/2022   Hyponatremia 07/15/2021   Generalized anxiety disorder 10/25/2020   Panic disorder 10/25/2020   Bipolar I disorder (HCC) 10/25/2020   Insomnia 10/25/2020   Vitamin B12 deficiency 04/23/2020   Paroxysmal  atrial fibrillation (HCC) 08/31/2017   Essential hypertension 08/31/2017    PCP: Marguerita Shih, MD  REFERRING PROVIDER: Sonja [place], MD  REFERRING DIAG: [phone] (ICD-10-CM) - Malignant neoplasm of upper-outer quadrant of left breast in female, estrogen receptor positive (HCC)  THERAPY DIAG:  Lymphedema, not elsewhere classified  Disorder of the skin and subcutaneous tissue related to radiation, unspecified  Malignant neoplasm of upper-outer quadrant of left breast in female, estrogen receptor positive (HCC)  ONSET DATE: 08/28/23  Rationale for Evaluation and Treatment: Rehabilitation  SUBJECTIVE:  SUBJECTIVE STATEMENT:  I feel like I need more instruction on the self massage.   EVAL: The swelling has been going on for about 6 months. I did not pay much attention to it. I thought it was a side effect of the radiation and would go away but it hasn't. It is red and hard.   PERTINENT HISTORY: 09/29/2022 diagnosed with L breast cancer, IDC, grade 2, ER/PR+, Ki67- 10%, L lumpectomy 11/02/22, completed radiation on 01/09/23, currently taking anastrozole , history of atrial fibrillation and bipolar   PAIN:  Are you having pain? Yes 8/10 low back   PRECAUTIONS: none  RED FLAGS: None   WEIGHT BEARING RESTRICTIONS: No  FALLS:  Has patient fallen in last 6 months? Yes. 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  COGNITION: Overall cognitive status: Within functional limits for tasks assessed   PALPATION: Increased fibrosis throughout L breast, increased scar tissue at lateral L breast  OBSERVATIONS / OTHER ASSESSMENTS: L breast pulled upwards due to scar tissue and increased edema with fold at scar tissue at lateral breast  POSTURE: forward head, rounded shoulders  UPPER EXTREMITY AROM/PROM: WFL  LYMPHEDEMA ASSESSMENTS:   LANDMARK RIGHT  eval  At axilla  32  15 cm proximal to olecranon process 31.8  10 cm proximal to olecranon process 29  Olecranon process 25  15 cm proximal to ulnar styloid process 23.3  10 cm proximal to ulnar styloid process 20  Just proximal to ulnar styloid process 15.2  Across hand at thumb web space 18.5  At base of 2nd digit 6.2  (Blank rows = not tested)  LANDMARK LEFT  eval  At axilla  33  15 cm proximal to olecranon process 31  10 cm proximal to olecranon process 28  Olecranon process 25.2  15 cm proximal to ulnar styloid process 22.6  10 cm proximal to ulnar styloid process 19  Just proximal to ulnar styloid process 15.1  Across hand at thumb web space 18.2  At base of 2nd digit 6  (Blank rows = not tested)   BREAST COMPLAINTS SCALE:  BREAST COMPLAINTS QUESTIONNAIRE    EVAL  03/26/24 Pain   0  0 Heaviness:  0  0 Swollen feeling: 10  7 Tense Skin:  3  2 Redness:  10  5 Bra Print:  0  0 Size of Pores:  0  0 Hard feeling:   9  5 Total:      32 /80  19/20 A Score over 9 indicates lymphedema issues in the breast  TREATMENT DATE:  Pt permission and consent throughout each step of examination and treatment with modification and draping if requested when working on sensitive areas 04/10/24: Manual  Therapy In supine: Had pt initially demonstrate what she is doing at home and then therapist provided v/c and t/c for correct pressure, speed, sequence, and skin stretch as follows: Short neck, 5 diaphragmatic breaths, Rt axillary nodes and establishment of anterior inter-axillary pathway, Lt inguinal nodes and establishment of Lt axillo-inguinal pathway, then L breast moving fluid towards pathways then finished retracing all steps spending extra time in any areas of fibrosis.   STM to the lateral breast and trunk in areas of scar tissue/radiation fibrosis  04/08/24: Manual Therapy In supine: Short neck, 5 diaphragmatic breaths, Rt axillary nodes and establishment of anterior inter-axillary pathway, Lt inguinal nodes and establishment of Lt axillo-inguinal pathway, then L breast moving fluid towards pathways then finished retracing all steps spending extra time in any areas of fibrosis.   STM to the lateral breast and trunk in areas of scar tissue/radiation fibrosis  03/31/24: Manual Therapy In supine: Short neck, 5 diaphragmatic breaths, Rt axillary nodes and establishment of anterior inter-axillary pathway, Lt inguinal nodes and establishment of Lt axillo-inguinal pathway, then L breast moving fluid towards pathways, then into Lt S/L for focus to lateral breast redirecting towards lateral anastomosis and spent time educating pt about how this position is easier to reach lateral breast and had her return demo here, then finished retracing all steps in supine spending extra time in any areas of fibrosis.   STM to the lateral breast and trunk in areas of scar tissue/radiation fibrosis  03/26/24: In supine: Short neck, 5 diaphragmatic breaths, bil axillary nodes and establishment of interaxillary pathway, L inguinal nodes and establishment of axilloinguinal pathway, then L breast moving fluid towards pathways spending extra time in any areas of fibrosis then retracing all steps.   STM to the lateral breast  and trunk in areas of scar tissue/fibrosis  03/20/24: Had pt demonstrate each step as follows: In supine: Short neck, 5 diaphragmatic breaths, bil axillary nodes and establishment of interaxillary pathway, L inguinal nodes and establishment of axilloinguinal pathway, then L breast moving fluid towards pathways spending extra time in any areas of fibrosis then retracing all steps. Provided v/c and t/c for correct pressure, skin stretch technique, direction of stretch and sequence. Slightly less cueing required this session.  STM to the lateral breast and trunk in areas of scar tissue/fibrosis  03/18/24: Had pt demonstrate each step as follows: In supine: Short neck, 5 diaphragmatic breaths, bil axillary nodes and establishment of interaxillary pathway, L inguinal nodes and establishment of axilloinguinal pathway, then L breast moving fluid towards pathways spending extra time in any areas of fibrosis then retracing all steps. Provided v/c and t/c for correct pressure, skin stretch technique, direction of stretch and sequence.  STM to the lateral breast and trunk in areas of scar tissue/fibrosis  03/10/24: In supine: Short neck, 5 diaphragmatic breaths, bil axillary nodes and establishment of interaxillary pathway, L inguinal nodes and establishment of axilloinguinal pathway, then L breast moving fluid towards pathways spending extra time in any areas of fibrosis then retracing all steps. Educated pt in anatomy and physiology of the lymphatic system and basic MLD principles. Gave handout of all steps. Then PT also performed in sidelying posterior work and STM to the lateral breast and trunk STM to lateral breast and trunk  02/26/24: In supine: Short neck, 5 diaphragmatic breaths, R axillary nodes  and establishment of interaxillary pathway, L inguinal nodes and establishment of axilloinguinal pathway, then L breast moving fluid towards pathways spending extra time in any areas of fibrosis then retracing all  steps. Educated pt in anatomy and physiology of the lymphatic system and basic MLD principles.  Gentle scar mobilization to scar Educated on need to obtain compression bra  PATIENT EDUCATION:  Education details: lymphedema, need for compression, anatomy and physiology of the lymphatic system Person educated: Patient Education method: Medical illustrator Education comprehension: verbalized understanding  HOME EXERCISE PROGRAM: Obtain a compression bra with maximal compression  ASSESSMENT:  CLINICAL IMPRESSION: Instructed pt in correct self MLD technique today. She had been using too much pressure and incorrect pathway. Provided v/c and t/c throughout. Pt reports improved understanding at end of session. Will assess independence with self MLD again at next session.   OBJECTIVE IMPAIRMENTS: decreased knowledge of condition, decreased knowledge of use of DME, increased edema, and increased fascial restrictions.   ACTIVITY LIMITATIONS: none  PARTICIPATION LIMITATIONS: none  PERSONAL FACTORS: Time since onset of injury/illness/exacerbation are also affecting patient's functional outcome.   REHAB POTENTIAL: Good  CLINICAL DECISION MAKING: Stable/uncomplicated  EVALUATION COMPLEXITY: Low  GOALS: Goals reviewed with patient? Yes  SHORT TERM GOALS=LONG TERM GOALS Target date: 03/25/24  Pt will be independent in self MLD for long term management of L breast lymphedema. Baseline: Goal status: MET   2.  Pt will obtain an appropriate compression bra for long term management of lymphedema.  Baseline:  Goal status: MET  3.  Pt will demonstrate a 50% improvement in fibrosis and swelling in L breast to decrease risk of cellulitis.  Baseline:  Goal status: ONGOING - went from 10/10 to 7/10 above.     PLAN:  PT FREQUENCY: 2x/week  PT DURATION: 4 weeks  PLANNED INTERVENTIONS: 97164- PT Re-evaluation, 97110-Therapeutic exercises, 97530- Therapeutic activity, [id number]  Neuromuscular re-education, 97535- Self Care, 16109- Manual therapy, and 97760- Orthotic Fit/training  PLAN FOR NEXT SESSION: review self MLD for L breast (intact system)  Deedee Farmer, PT 04/10/2024, 3:03 PM

## 2024-04-15 ENCOUNTER — Ambulatory Visit: Admitting: Physical Therapy

## 2024-04-17 ENCOUNTER — Ambulatory Visit: Admitting: Physical Therapy

## 2024-04-17 DIAGNOSIS — M47816 Spondylosis without myelopathy or radiculopathy, lumbar region: Secondary | ICD-10-CM | POA: Diagnosis not present

## 2024-04-19 ENCOUNTER — Other Ambulatory Visit: Payer: Self-pay | Admitting: Physician Assistant

## 2024-04-22 ENCOUNTER — Ambulatory Visit: Admitting: Physical Therapy

## 2024-04-22 ENCOUNTER — Encounter: Payer: Self-pay | Admitting: Physical Therapy

## 2024-04-22 DIAGNOSIS — Z170 Estrogen receptor positive status [ER+]: Secondary | ICD-10-CM

## 2024-04-22 DIAGNOSIS — L599 Disorder of the skin and subcutaneous tissue related to radiation, unspecified: Secondary | ICD-10-CM

## 2024-04-22 DIAGNOSIS — I890 Lymphedema, not elsewhere classified: Secondary | ICD-10-CM | POA: Diagnosis not present

## 2024-04-22 NOTE — Therapy (Signed)
 OUTPATIENT PHYSICAL THERAPY  UPPER EXTREMITY ONCOLOGY TREATMENT  Patient Name: Margaret Mckenzie MRN: [id number] DOB:[birth year], 73 y.o., female Today's Date: 04/22/2024  END OF SESSION:  PT End of Session - 04/22/24 1500     Visit Number 9    Number of Visits 13    Date for PT Re-Evaluation 04/23/24    Authorization Type not needed    PT Start Time 1402    PT Stop Time 1457    PT Time Calculation (min) 55 min    Activity Tolerance Patient tolerated treatment well    Behavior During Therapy WFL for tasks assessed/performed                Past Medical History:  Diagnosis Date   Anxiety    Bipolar 1 disorder (HCC)    Chicken pox    Depression    Hypertension    high blood pressure readings    Kidney disease    kidney stones and saw urologist, Dr. Patty Borg for lithotripsy remotely    PAF (paroxysmal atrial fibrillation) (HCC)    Personal history of radiation therapy    Past Surgical History:  Procedure Laterality Date   BREAST BIOPSY  10/31/2022   MM LT RADIOACTIVE SEED LOC MAMMO GUIDE 10/31/2022 [facility]   BREAST LUMPECTOMY Left 11/02/2022   BREAST LUMPECTOMY WITH RADIOACTIVE SEED LOCALIZATION Left 11/02/2022   Procedure: LEFT BREAST LUMPECTOMY WITH RADIOACTIVE SEED LOCALIZATION;  Surgeon: Sim Dryer, MD;  Location: [facility];  Service: General;  Laterality: Left;   BUNIONECTOMY  11/27/2005   TONSILLECTOMY AND ADENOIDECTOMY  11/27/1973   Patient Active Problem List   Diagnosis Date Noted   NASH (nonalcoholic steatohepatitis) 12/11/2022   Transaminitis 11/09/2022   Malignant neoplasm of upper-outer quadrant of left breast in female, estrogen receptor positive (HCC) 10/09/2022   Chronic atrial fibrillation (HCC) 05/08/2022   Hyponatremia 07/15/2021   Generalized anxiety disorder 10/25/2020   Panic disorder 10/25/2020   Bipolar I disorder (HCC) 10/25/2020   Insomnia 10/25/2020   Vitamin B12 deficiency 04/23/2020    Paroxysmal atrial fibrillation (HCC) 08/31/2017   Essential hypertension 08/31/2017    PCP: Marguerita Shih, MD  REFERRING PROVIDER: Sonja [place], MD  REFERRING DIAG: [phone] (ICD-10-CM) - Malignant neoplasm of upper-outer quadrant of left breast in female, estrogen receptor positive (HCC)  THERAPY DIAG:  Lymphedema, not elsewhere classified  Disorder of the skin and subcutaneous tissue related to radiation, unspecified  Malignant neoplasm of upper-outer quadrant of left breast in female, estrogen receptor positive (HCC)  ONSET DATE: 08/28/23  Rationale for Evaluation and Treatment: Rehabilitation  SUBJECTIVE:  SUBJECTIVE STATEMENT:  I have been having terrible back pain. I would like for you to do all the MLD today.   EVAL: The swelling has been going on for about 6 months. I did not pay much attention to it. I thought it was a side effect of the radiation and would go away but it hasn't. It is red and hard.   PERTINENT HISTORY: 09/29/2022 diagnosed with L breast cancer, IDC, grade 2, ER/PR+, Ki67- 10%, L lumpectomy 11/02/22, completed radiation on 01/09/23, currently taking anastrozole , history of atrial fibrillation and bipolar   PAIN:  Are you having pain? Yes not rated on 0-10 scale  PRECAUTIONS: none  RED FLAGS: None   WEIGHT BEARING RESTRICTIONS: No  FALLS:  Has patient fallen in last 6 months? Yes. 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  COGNITION: Overall cognitive status: Within functional limits for tasks assessed   PALPATION: Increased fibrosis throughout L breast, increased scar tissue at lateral L breast  OBSERVATIONS / OTHER ASSESSMENTS: L breast pulled upwards due to scar tissue and increased edema with fold at scar tissue at lateral breast  POSTURE: forward head, rounded shoulders  UPPER EXTREMITY AROM/PROM: WFL  LYMPHEDEMA ASSESSMENTS:   LANDMARK RIGHT  eval  At axilla  32  15 cm proximal to olecranon process 31.8  10 cm proximal to olecranon process 29  Olecranon process 25  15 cm proximal to ulnar styloid process 23.3  10 cm proximal to ulnar styloid process 20  Just proximal to ulnar styloid process 15.2  Across hand at thumb web space 18.5  At base of 2nd digit 6.2  (Blank rows = not tested)  LANDMARK LEFT  eval  At axilla  33  15 cm proximal to olecranon process 31  10 cm proximal to olecranon process 28  Olecranon process 25.2  15 cm proximal to ulnar styloid process 22.6  10 cm proximal to ulnar styloid process 19  Just proximal to ulnar styloid process 15.1  Across hand at thumb web space 18.2  At base of 2nd digit 6  (Blank rows = not tested)   BREAST COMPLAINTS SCALE:  BREAST COMPLAINTS QUESTIONNAIRE    EVAL  03/26/24 Pain   0  0 Heaviness:  0  0 Swollen feeling: 10  7 Tense Skin:  3  2 Redness:  10  5 Bra Print:  0  0 Size of Pores:  0  0 Hard feeling:   9  5 Total:      32 /80  19/20 A Score over 9 indicates lymphedema issues in the breast  TREATMENT DATE:  Pt permission and consent throughout each step of examination and treatment with modification and draping if requested  when working on sensitive areas 04/22/24: Manual Therapy In supine: Short neck, 5 diaphragmatic breaths, Rt axillary nodes and establishment of anterior inter-axillary pathway, Lt inguinal nodes and establishment of Lt axillo-inguinal pathway, then L breast moving fluid towards pathways then finished retracing all steps spending extra time in any areas of fibrosis.   STM to the lateral breast and trunk in areas of scar tissue/radiation fibrosis  04/10/24: Manual Therapy In supine: Had pt initially demonstrate what she is doing at home and then therapist provided v/c and t/c for correct pressure, speed, sequence, and skin stretch as follows: Short neck, 5 diaphragmatic breaths, Rt axillary nodes and establishment of anterior inter-axillary pathway, Lt inguinal nodes and establishment of Lt axillo-inguinal pathway, then L breast moving fluid towards pathways then finished retracing all steps spending extra time in any areas of fibrosis.   STM to the lateral breast and trunk in areas of scar tissue/radiation fibrosis  04/08/24: Manual Therapy In supine: Short neck, 5 diaphragmatic breaths, Rt axillary nodes and establishment of anterior inter-axillary pathway, Lt inguinal nodes and establishment of Lt axillo-inguinal pathway, then L breast moving fluid towards pathways then finished retracing all steps spending extra time in any areas of fibrosis.   STM to the lateral breast and trunk in areas of scar tissue/radiation fibrosis  03/31/24: Manual Therapy In supine: Short neck, 5 diaphragmatic breaths, Rt axillary nodes and establishment of anterior inter-axillary pathway, Lt inguinal nodes and establishment of Lt axillo-inguinal pathway, then L breast moving fluid towards pathways, then into Lt S/L for focus to lateral breast redirecting towards lateral anastomosis and spent time educating pt about how this position is easier to reach lateral breast and had her return demo here, then finished retracing all  steps in supine spending extra time in any areas of fibrosis.   STM to the lateral breast and trunk in areas of scar tissue/radiation fibrosis  03/26/24: In supine: Short neck, 5 diaphragmatic breaths, bil axillary nodes and establishment of interaxillary pathway, L inguinal nodes and establishment of axilloinguinal pathway, then L breast moving fluid towards pathways spending extra time in any areas of fibrosis then retracing all steps.   STM to the lateral breast and trunk in areas of scar tissue/fibrosis  03/20/24: Had pt demonstrate each step as follows: In supine: Short neck, 5 diaphragmatic breaths, bil axillary nodes and establishment of interaxillary pathway, L inguinal nodes and establishment of axilloinguinal pathway, then L breast moving fluid towards pathways spending extra time in any areas of fibrosis then retracing all steps. Provided v/c and t/c for correct pressure, skin stretch technique, direction of stretch and sequence. Slightly less cueing required this session.  STM to the lateral breast and trunk in areas of scar tissue/fibrosis  03/18/24: Had pt demonstrate each step as follows: In supine: Short neck, 5 diaphragmatic breaths, bil axillary nodes and establishment of interaxillary pathway, L inguinal nodes and establishment of axilloinguinal pathway, then L breast moving fluid towards pathways spending extra time in any areas of fibrosis then retracing all steps. Provided v/c and t/c for correct pressure, skin stretch technique, direction of stretch and sequence.  STM to the lateral breast and trunk in areas of scar tissue/fibrosis  03/10/24: In supine: Short neck, 5 diaphragmatic breaths, bil axillary nodes and establishment of interaxillary pathway, L inguinal nodes and establishment of axilloinguinal pathway, then L breast moving fluid towards pathways spending  extra time in any areas of fibrosis then retracing all steps. Educated pt in anatomy and physiology of the lymphatic system  and basic MLD principles. Gave handout of all steps. Then PT also performed in sidelying posterior work and STM to the lateral breast and trunk STM to lateral breast and trunk  02/26/24: In supine: Short neck, 5 diaphragmatic breaths, R axillary nodes and establishment of interaxillary pathway, L inguinal nodes and establishment of axilloinguinal pathway, then L breast moving fluid towards pathways spending extra time in any areas of fibrosis then retracing all steps. Educated pt in anatomy and physiology of the lymphatic system and basic MLD principles.  Gentle scar mobilization to scar Educated on need to obtain compression bra  PATIENT EDUCATION:  Education details: lymphedema, need for compression, anatomy and physiology of the lymphatic system Person educated: Patient Education method: Medical illustrator Education comprehension: verbalized understanding  HOME EXERCISE PROGRAM: Obtain a compression bra with maximal compression  ASSESSMENT:  CLINICAL IMPRESSION: Pt has now met all goals for therapy. Her fibrosis and swelling has improved greatly and pt feels she can independently manage her lymphedema going forward. She will be discharged from skilled PT services at this time.   OBJECTIVE IMPAIRMENTS: decreased knowledge of condition, decreased knowledge of use of DME, increased edema, and increased fascial restrictions.   ACTIVITY LIMITATIONS: none  PARTICIPATION LIMITATIONS: none  PERSONAL FACTORS: Time since onset of injury/illness/exacerbation are also affecting patient's functional outcome.   REHAB POTENTIAL: Good  CLINICAL DECISION MAKING: Stable/uncomplicated  EVALUATION COMPLEXITY: Low  GOALS: Goals reviewed with patient? Yes  SHORT TERM GOALS=LONG TERM GOALS Target date: 03/25/24  Pt will be independent in self MLD for long term management of L breast lymphedema. Baseline: Goal status: MET   2.  Pt will obtain an appropriate compression bra for long  term management of lymphedema.  Baseline:  Goal status: MET  3.  Pt will demonstrate a 50% improvement in fibrosis and swelling in L breast to decrease risk of cellulitis.  Baseline:  Goal status: MET 04/22/24 55% improved   PLAN:  PT FREQUENCY: 2x/week  PT DURATION: 4 weeks  PLANNED INTERVENTIONS: 97164- PT Re-evaluation, 97110-Therapeutic exercises, 97530- Therapeutic activity, 97112- Neuromuscular re-education, 97535- Self Care, 78295- Manual therapy, and 97760- Orthotic Fit/training  PLAN FOR NEXT SESSION: d/c this session  [employer], PT 04/22/2024, 3:01 PM  PHYSICAL THERAPY DISCHARGE SUMMARY  Visits from Start of Care: 9  Current functional level related to goals / functional outcomes: All goals met   Remaining deficits: None   Education / Equipment: Self MLD, compression bra   Patient agrees to discharge. Patient goals were met. Patient is being discharged due to meeting the stated rehab goals.  Evelyn Hire [place], [place] 04/22/24 3:08 PM

## 2024-04-26 ENCOUNTER — Other Ambulatory Visit: Payer: Self-pay | Admitting: Physician Assistant

## 2024-04-30 ENCOUNTER — Encounter: Payer: Self-pay | Admitting: Internal Medicine

## 2024-05-05 DIAGNOSIS — M47816 Spondylosis without myelopathy or radiculopathy, lumbar region: Secondary | ICD-10-CM | POA: Diagnosis not present

## 2024-05-05 DIAGNOSIS — M47814 Spondylosis without myelopathy or radiculopathy, thoracic region: Secondary | ICD-10-CM | POA: Diagnosis not present

## 2024-05-07 ENCOUNTER — Ambulatory Visit
Admission: RE | Admit: 2024-05-07 | Discharge: 2024-05-07 | Disposition: A | Discharge status: Home / Self Care | Source: Ambulatory Visit | Attending: Internal Medicine | Admitting: Internal Medicine

## 2024-05-07 DIAGNOSIS — E2839 Other primary ovarian failure: Secondary | ICD-10-CM | POA: Diagnosis not present

## 2024-05-22 ENCOUNTER — Ambulatory Visit: Payer: Self-pay

## 2024-05-22 DIAGNOSIS — M47816 Spondylosis without myelopathy or radiculopathy, lumbar region: Secondary | ICD-10-CM | POA: Diagnosis not present

## 2024-05-28 ENCOUNTER — Encounter: Payer: Self-pay | Admitting: Internal Medicine

## 2024-05-28 ENCOUNTER — Ambulatory Visit: Admitting: Internal Medicine

## 2024-05-28 VITALS — BP 110/80 | HR 88 | Temp 98.2°F | Wt 133.0 lb

## 2024-05-28 DIAGNOSIS — M810 Age-related osteoporosis without current pathological fracture: Secondary | ICD-10-CM

## 2024-05-28 NOTE — Progress Notes (Signed)
 Established Patient Office Visit     CC/Reason for Visit: Discuss DEXA scan results  HPI: Margaret Mckenzie is a 73 y.o. female who is coming in today for the above mentioned reasons.  As part of screening she had a DEXA scan which showed osteoporosis with a right femoral neck T-score of -2.8, left femoral neck T-score -2.5 and radius -3.2.  She was asked to come in today to discuss.   Past Medical/Surgical History: Past Medical History:  Diagnosis Date   Anxiety    Bipolar 1 disorder (HCC)    Chicken pox    Depression    Hypertension    high blood pressure readings    Kidney disease    kidney stones and saw urologist, Dr. Catarina for lithotripsy remotely    PAF (paroxysmal atrial fibrillation) [facility])    Personal history of radiation therapy     Past Surgical History:  Procedure Laterality Date   BREAST BIOPSY  10/31/2022   MM LT RADIOACTIVE SEED LOC MAMMO GUIDE 10/31/2022 [facility]MO[facility]AP[facility]   BREAST LUMPECTOMY Left 11/02/2022   BREAST LUMPECTOMY WITH RADIOACTIVE SEED LOCALIZATION Left 11/02/2022   Procedure: LEFT BREAST LUMPECTOMY WITH RADIOACTIVE SEED LOCALIZATION;  Surgeon: Vanderbilt Ned, MD;  Location: [facility];  Service: General;  Laterality: Left;   BUNIONECTOMY  11/27/2005   TONSILLECTOMY AND ADENOIDECTOMY  11/27/1973    Social History:  reports that she has been smoking e-cigarettes. She has never used smokeless tobacco. She reports current alcohol use of about 5.0 standard drinks of alcohol per week. She reports that she does not use drugs.  Allergies: Allergies  Allergen Reactions   Nefazodone Other (See Comments)    Hypersensitivity   Trazodone  Other (See Comments)    Trazodone  causes lethargy the morning after taking it    Family History:  Family History  Problem Relation Age of Onset   Osteoporosis Mother    Stroke Mother        TIA, stoke   Mental retardation Mother    Hypertension Father    Heart disease Father 28    Stroke Father 58   Mental illness Sister    Breast cancer Neg Hx      Current Outpatient Medications:    acamprosate  (CAMPRAL ) 333 MG tablet, Take 666 mg by mouth 3 (three) times daily., Disp: , Rfl:    acamprosate  (CAMPRAL ) 333 MG tablet, Take 2 tablets (666 mg total) by mouth 3 (three) times daily with meals., Disp: 180 tablet, Rfl: 2   acetaminophen -codeine (TYLENOL  #2) 300-15 MG tablet, Take by mouth., Disp: , Rfl:    anastrozole  (ARIMIDEX ) 1 MG tablet, Take 1 mg by mouth daily., Disp: , Rfl:    anastrozole  (ARIMIDEX ) 1 MG tablet, Take 1 tablet (1 mg total) by mouth daily., Disp: 90 tablet, Rfl: 1   Ascorbic Acid  (VITAMIN C) 1000 MG tablet, Take 1,000 mg by mouth at bedtime., Disp: , Rfl:    busPIRone  (BUSPAR ) 15 MG tablet, TAKE 1 TABLET BY MOUTH 3 TIMES DAILY., Disp: 270 tablet, Rfl: 1   Calcium  Carbonate (CALCIUM  500 PO), Take 1 tablet by mouth at bedtime., Disp: , Rfl:    Cholecalciferol  (VITAMIN D -3) 25 MCG (1000 UT) CAPS, Take 4,000 Units by mouth at bedtime., Disp: , Rfl:    cyanocobalamin  (VITAMIN B12) 1000 MCG/ML injection, INJECT 1 ML (1,000 MCG) INTRAMUSCULARLY EVERY 30 DAYS, Disp: 3 mL, Rfl: 8   diazepam  (VALIUM ) 10 MG tablet, TAKE 0.5-1 TABLETS (5-10 MG TOTAL) BY  MOUTH EVERY 8 (EIGHT) HOURS AS NEEDED FOR ANXIETY., Disp: 90 tablet, Rfl: 2   diclofenac  Sodium (VOLTAREN ) 1 % GEL, Apply 2 g topically 4 (four) times daily., Disp: 150 g, Rfl: 1   diltiazem  (CARDIZEM ) 30 MG tablet, Take 1 tablet every 4 hours AS NEEDED for AFIB heart rate >100, Disp: 45 tablet, Rfl: 1   diltiazem  (DILT-XR) 180 MG 24 hr capsule, TAKE 1 CAPSULE BY MOUTH EVERY DAY, Disp: 90 capsule, Rfl: 2   ELIQUIS  5 MG TABS tablet, TAKE 1 TABLET BY MOUTH TWICE A DAY, Disp: 60 tablet, Rfl: 5   lamoTRIgine  (LAMICTAL ) 150 MG tablet, TAKE 2 TABLETS BY MOUTH AT BEDTIME., Disp: 180 tablet, Rfl: 0   lidocaine  (LIDODERM ) 5 %, Place 1 patch onto the skin daily. Remove & Discard patch within 12 hours or as directed by MD,  Disp: 30 patch, Rfl: 0   lisinopril  (ZESTRIL ) 20 MG tablet, Take 1 tablet (20 mg total) by mouth daily., Disp: 90 tablet, Rfl: 1   Magnesium 400 MG CAPS, Take 400 mg by mouth at bedtime., Disp: , Rfl:    risperiDONE  (RISPERDAL ) 3 MG tablet, TAKE 1 TABLET BY MOUTH AT BEDTIME., Disp: 90 tablet, Rfl: 1   thiamine  (VITAMIN B1) 100 MG tablet, Take 100 mg by mouth daily., Disp: , Rfl:    traMADol  (ULTRAM ) 50 MG tablet, Take 50 mg by mouth every 6 (six) hours as needed., Disp: , Rfl:    traZODone  (DESYREL ) 50 MG tablet, TAKE 1-2 TABLETS (50-100 MG TOTAL) BY MOUTH AT BEDTIME AS NEEDED. FOR SLEEP, Disp: 180 tablet, Rfl: 1   HYDROcodone -acetaminophen  (NORCO/VICODIN) 5-325 MG tablet, Take 2 tablets by mouth every 4 (four) hours as needed. (Patient not taking: Reported on 05/28/2024), Disp: 10 tablet, Rfl: 0  Review of Systems:  Negative unless indicated in HPI.   Physical Exam: Vitals:   05/28/24 1325  BP: 110/80  Pulse: 88  Temp: 98.2 F (36.8 C)  TempSrc: Oral  SpO2: 98%  Weight: 133 lb (60.3 kg)    Body mass index is 22.83 kg/m.    Impression and Plan:  Age-related osteoporosis without current pathological fracture  -After discussion of treatment options, she would like to proceed with Evenity for a year followed by Prolia.  Will send to portal.   Time spent:20 minutes reviewing chart, interviewing and examining patient and formulating plan of care.     Tully Theophilus Andrews, MD [facility]

## 2024-06-03 ENCOUNTER — Telehealth: Payer: Self-pay

## 2024-06-03 NOTE — Telephone Encounter (Signed)
 Copied from CRM [id number]. Topic: General - Call Back - No Documentation >> Jun 03, 2024  2:50 PM Margaret Mckenzie wrote: Reason for CRM: Pt returning phone call to [place]. I let her know the notes and patient expressed understanding. States she is going to call insurance and let us  know if she can afford the Evenity or not.

## 2024-06-03 NOTE — Telephone Encounter (Signed)
 Explained to the patient she may get a bill for her part of the 20% coinsurance. Patient is going to call her insurance company before scheduling.

## 2024-06-03 NOTE — Telephone Encounter (Addendum)
$  0 co-pay to receive Prolia, but may receive bill from insurance. Evenity is not covered.

## 2024-06-03 NOTE — Telephone Encounter (Signed)
 Copied from CRM [id number]. Topic: General - Other >> Jun 03, 2024  3:13 PM Berneda FALCON wrote: Reason for CRM: Pt called back again and wanted to let the clinic know that she wants to go with the Prolia injections and not the Evenity, please.  [id number] [address],  - [address] [facility] [address] [place] [address] Phone: [phone] Fax: [phone] Hours: Not open 24 hours

## 2024-06-03 NOTE — Telephone Encounter (Signed)
 Left message on machine for patient to return our call

## 2024-06-04 ENCOUNTER — Telehealth: Payer: Self-pay

## 2024-06-04 MED ORDER — ALENDRONATE SODIUM 70 MG PO TABS: 70.0000 mg | ORAL_TABLET | ORAL | 3 refills | Status: AC

## 2024-06-04 NOTE — Addendum Note (Signed)
 Addended by: KATHRYNE MILLMAN B on: 06/04/2024 03:52 PM   Modules accepted: Orders

## 2024-06-04 NOTE — Telephone Encounter (Signed)
 Copied from CRM [id number]. Topic: Clinical - Medication Question >> Jun 04, 2024 12:31 PM Aisha D wrote: Reason for CRM: Pt wants to inform Vernell that she will not be proceeding with the prolia injections because its too expensive. Pt would like to see what other alternatives she has and would like a callback.

## 2024-06-04 NOTE — Telephone Encounter (Signed)
Patient is aware and Rx sent 

## 2024-06-05 ENCOUNTER — Other Ambulatory Visit: Payer: Self-pay | Admitting: Internal Medicine

## 2024-06-05 DIAGNOSIS — I10 Essential (primary) hypertension: Secondary | ICD-10-CM

## 2024-06-09 ENCOUNTER — Encounter: Payer: Self-pay | Admitting: Physician Assistant

## 2024-06-09 ENCOUNTER — Ambulatory Visit (INDEPENDENT_AMBULATORY_CARE_PROVIDER_SITE_OTHER): Payer: PPO | Admitting: Physician Assistant

## 2024-06-09 DIAGNOSIS — Z79899 Other long term (current) drug therapy: Secondary | ICD-10-CM | POA: Diagnosis not present

## 2024-06-09 DIAGNOSIS — F319 Bipolar disorder, unspecified: Secondary | ICD-10-CM | POA: Diagnosis not present

## 2024-06-09 DIAGNOSIS — Z720 Tobacco use: Secondary | ICD-10-CM

## 2024-06-09 DIAGNOSIS — F411 Generalized anxiety disorder: Secondary | ICD-10-CM

## 2024-06-09 DIAGNOSIS — G4700 Insomnia, unspecified: Secondary | ICD-10-CM | POA: Diagnosis not present

## 2024-06-09 MED ORDER — RISPERIDONE 3 MG PO TABS: 3.0000 mg | ORAL_TABLET | Freq: Every day | ORAL | 1 refills | Status: AC

## 2024-06-09 MED ORDER — LAMOTRIGINE 150 MG PO TABS: 300.0000 mg | ORAL_TABLET | Freq: Every day | ORAL | 1 refills | Status: AC

## 2024-06-09 MED ORDER — DIAZEPAM 10 MG PO TABS: 5.0000 mg | ORAL_TABLET | Freq: Three times a day (TID) | ORAL | 1 refills | Status: DC | PRN

## 2024-06-09 MED ORDER — NALTREXONE HCL 50 MG PO TABS: 50.0000 mg | ORAL_TABLET | Freq: Every day | ORAL | 1 refills | Status: DC

## 2024-06-09 NOTE — Progress Notes (Unsigned)
 [facility] Med Check  Patient ID: Margaret Mckenzie,  MRN: [id number]  PCP: Theophilus Andrews, Tully GRADE, MD  Date of Evaluation: 06/09/2024 Time spent:30 minutes  Chief Complaint:  Chief Complaint   Depression; Anxiety; Follow-up    HISTORY/CURRENT STATUS: HPI  For routine med check  Doing well with mood. Meds are working as intended. Able to enjoy things, energy/motivation are good for the most part.  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   No extreme sadness, tearfulness, or feelings of hopelessness.  Sleeps well most of the time. ADLs and personal hygiene are normal.   Denies any changes in concentration, making decisions, or remembering things.  Appetite has not changed.  Weight is stable.  Anxiety in general is well-treated.  Valium  helps when needed. No AH/VH, mania, delirium, SI/HI.    She has quit drinking alcohol completely.  The acamprosate  was helpful but she no longer needs it and has already stopped it.  She is still vaping and would like help getting off that.  Denies dizziness, syncope, seizures, numbness, tingling, tremor, tics, unsteady gait, slurred speech, confusion.  Has chronic and acute back pain in the lumbar spine.  To see review of systems.  Pain is better now after procedure.  Denies unexplained weight loss, frequent infections, or sores that heal slowly.  No polyphagia, polydipsia, or polyuria. Denies visual changes or paresthesias.   Individual Medical History/ Review of Systems: Changes? :Yes  had ablation of T 12 nerve, on June 9th.   Past medications for mental health diagnoses include: Depakote, Lamictal , Prozac, Lexapro, Xanax , Sonata caused amnesia, Risperdal , Trazodone  ineffective at low doses but caused dizziness at higher doses.  Librium  taper for detox, toward the end of the treatment she had issues with balance.  Acamprosate , questionably caused imbalance.  Allergies: Nefazodone and Trazodone   Current Medications:  Current Outpatient  Medications:    acetaminophen -codeine (TYLENOL  #2) 300-15 MG tablet, Take by mouth. (Patient taking differently: Take by mouth. prn), Disp: , Rfl:    alendronate  (FOSAMAX ) 70 MG tablet, Take 1 tablet (70 mg total) by mouth once a week. Take with a full glass of water on an empty stomach., Disp: 12 tablet, Rfl: 3   anastrozole  (ARIMIDEX ) 1 MG tablet, Take 1 mg by mouth daily., Disp: , Rfl:    anastrozole  (ARIMIDEX ) 1 MG tablet, Take 1 tablet (1 mg total) by mouth daily., Disp: 90 tablet, Rfl: 1   Ascorbic Acid  (VITAMIN C) 1000 MG tablet, Take 1,000 mg by mouth at bedtime., Disp: , Rfl:    busPIRone  (BUSPAR ) 15 MG tablet, TAKE 1 TABLET BY MOUTH 3 TIMES DAILY., Disp: 270 tablet, Rfl: 1   Calcium  Carbonate (CALCIUM  500 PO), Take 1 tablet by mouth at bedtime., Disp: , Rfl:    Cholecalciferol  (VITAMIN D -3) 25 MCG (1000 UT) CAPS, Take 4,000 Units by mouth at bedtime., Disp: , Rfl:    cyanocobalamin  (VITAMIN B12) 1000 MCG/ML injection, INJECT 1 ML (1,000 MCG) INTRAMUSCULARLY EVERY 30 DAYS, Disp: 3 mL, Rfl: 8   diclofenac  Sodium (VOLTAREN ) 1 % GEL, Apply 2 g topically 4 (four) times daily., Disp: 150 g, Rfl: 1   diltiazem  (CARDIZEM ) 30 MG tablet, Take 1 tablet every 4 hours AS NEEDED for AFIB heart rate >100, Disp: 45 tablet, Rfl: 1   diltiazem  (DILT-XR) 180 MG 24 hr capsule, TAKE 1 CAPSULE BY MOUTH EVERY DAY, Disp: 90 capsule, Rfl: 2   ELIQUIS  5 MG TABS tablet, TAKE 1 TABLET BY MOUTH TWICE A DAY, Disp: 60 tablet,  Rfl: 5   lisinopril  (ZESTRIL ) 20 MG tablet, TAKE 1 TABLET BY MOUTH EVERY DAY, Disp: 90 tablet, Rfl: 1   Magnesium 400 MG CAPS, Take 400 mg by mouth at bedtime., Disp: , Rfl:    naltrexone  (DEPADE) 50 MG tablet, Take 1 tablet (50 mg total) by mouth daily., Disp: 30 tablet, Rfl: 1   thiamine  (VITAMIN B1) 100 MG tablet, Take 100 mg by mouth daily., Disp: , Rfl:    traZODone  (DESYREL ) 50 MG tablet, TAKE 1-2 TABLETS (50-100 MG TOTAL) BY MOUTH AT BEDTIME AS NEEDED. FOR SLEEP, Disp: 180 tablet, Rfl: 1    diazepam  (VALIUM ) 10 MG tablet, Take 0.5-1 tablets (5-10 mg total) by mouth every 8 (eight) hours as needed for anxiety., Disp: 90 tablet, Rfl: 1   HYDROcodone -acetaminophen  (NORCO/VICODIN) 5-325 MG tablet, Take 2 tablets by mouth every 4 (four) hours as needed. (Patient not taking: Reported on 06/09/2024), Disp: 10 tablet, Rfl: 0   lamoTRIgine  (LAMICTAL ) 150 MG tablet, Take 2 tablets (300 mg total) by mouth at bedtime., Disp: 180 tablet, Rfl: 1   lidocaine  (LIDODERM ) 5 %, Place 1 patch onto the skin daily. Remove & Discard patch within 12 hours or as directed by MD (Patient not taking: Reported on 06/09/2024), Disp: 30 patch, Rfl: 0   risperiDONE  (RISPERDAL ) 3 MG tablet, Take 1 tablet (3 mg total) by mouth at bedtime., Disp: 90 tablet, Rfl: 1   traMADol  (ULTRAM ) 50 MG tablet, Take 50 mg by mouth every 6 (six) hours as needed. (Patient not taking: Reported on 06/09/2024), Disp: , Rfl:  Medication Side Effects: none  Family Medical/ Social History: Changes?  No  MENTAL HEALTH EXAM:  There were no vitals taken for this visit.There is no height or weight on file to calculate BMI.  General Appearance: Casual, Neat, and Well Groomed  Eye Contact:  Good  Speech:  Clear and Coherent and Normal Rate  Volume:  Normal  Mood:  Euthymic  Affect:  Congruent  Thought Process:  Goal Directed and Descriptions of Associations: Circumstantial  Orientation:  Full (Time, Place, and Person)  Thought Content: Logical   Suicidal Thoughts:  No  Homicidal Thoughts:  No  Memory:  WNL  Judgement:  Good  Insight:  Good  Psychomotor Activity:  Normal  Concentration:  Concentration: Good and Attention Span: Good  Recall:  Good  Fund of Knowledge: Good  Language: Good  Assets:  Communication Skills Desire for Improvement Financial Resources/Insurance Housing Resilience Transportation Vocational/Educational  ADL's:  Intact  Cognition: WNL  Prognosis:  Good   Labs 01/16/2024 CBC with differential completely  normal CMP sodium 131, chloride 95, glucose 108, BUN 7, all other values normal  PCP follows labs  DIAGNOSES:    ICD-10-CM   1. Bipolar I disorder (HCC)  F31.9     2. Insomnia, unspecified type  G47.00     3. Generalized anxiety disorder  F41.1     4. Vapes nicotine containing substance  Z72.0     5. Chronic prescription benzodiazepine use  Z79.899      Receiving Psychotherapy: Yes   Matt Schooler  RECOMMENDATIONS:  PDMP was reviewed.  Last Valium  filled 04/16/2024.  She has been given a few narcotics several times since her last visit. I provided approximately  30 minutes of face to face time during this encounter, including time spent before and after the visit in records review, medical decision making, counseling pertinent to today's visit, and charting.   Mileah is doing very well as far  as her mood goes.    I provided approximately 20 minutes in counseling concerning vaping cessation.  Chantix was discussed however I am hesitant to prescribe it to her because of worsening depression in some patients.  She is not interested in that.  We also discussed NicoDerm patches and gum.  Another option would be naltrexone , off-label which can help with nicotine cravings.  She would like to try this.  Benefits, risk and side effects were discussed.  She occasionally takes an opiate for the back pain, she understands that it will not be effective if she is taking naltrexone .  She will let her providers know that she is taking it if they prescribe an opiate, there may be another option either and pain management or holding the naltrexone .    We discussed the benzodiazepine with opiates.  If she ends up needing opiates more routinely, we may need to decrease or even stop the Valium .  Patient states she Will give up valium  if she has to choose between the 2.  Pain control is very important to her.  Continue BuSpar  15 mg, 1 p.o. 3 times daily.  (Can increase by phone if needed.) Continue Valium  10  mg, 1/2-1 p.o. 3 times daily as needed anxiety.   Continue Lamictal  150 mg, 2 p.o. nightly. Start naltrexone  50 mg, 1 p.o. daily. Continue Risperdal  3 mg, 1 p.o. nightly. Continue trazodone  50 mg, 1/2-2 p.o. nightly as needed sleep. Continue vitamins as per med list.    Continue therapy with Adina Sol.  Return in 6 months.  Verneita Cooks, PA-C

## 2024-06-10 ENCOUNTER — Encounter: Payer: Self-pay | Admitting: Physician Assistant

## 2024-06-14 ENCOUNTER — Other Ambulatory Visit: Payer: Self-pay | Admitting: Hematology

## 2024-07-10 ENCOUNTER — Other Ambulatory Visit: Payer: Self-pay

## 2024-07-10 DIAGNOSIS — M47816 Spondylosis without myelopathy or radiculopathy, lumbar region: Secondary | ICD-10-CM | POA: Diagnosis not present

## 2024-07-10 DIAGNOSIS — Z170 Estrogen receptor positive status [ER+]: Secondary | ICD-10-CM

## 2024-07-10 NOTE — Assessment & Plan Note (Addendum)
-  pT1b,cN0M0, G1, ER+/PR+/HER2- -Found on screening mammogram, diagnosed in October 2023 -She underwent left breast lumpectomy, sentinel lymph nodes was not obtained due to the early stage disease and her age. -I discussed her surgical pathology findings, which is very favorable. -She completed adjuvant radiation -She started adjuvant anastrozole  in February 2024 - Diagnostic mammogram due in October 2025 and this was ordered as part of today's visit. -She is clinically doing well.  Breast exam is benign.  Now on Fosamax  weekly for osteoporosis. - Plan for labs and follow-up in 6 months.

## 2024-07-10 NOTE — Progress Notes (Signed)
 Patient Care Team: Margaret Mckenzie, Tully GRADE, MD as PCP - General (Internal Medicine) Lavona Agent, MD as PCP - Cardiology (Cardiology) Group, [facility]) Lanny Callander, MD as Consulting Physician (Hematology) Vanderbilt Ned, MD as Consulting Physician (General Surgery) Dewey Rush, MD as Consulting Physician (Radiation Oncology) Lionell Jon DEL, [facility] (Pharmacist)  Clinic Day:  07/11/2024  Referring physician: Theophilus Mckenzie, Estel*  ASSESSMENT & PLAN:   Assessment & Plan: Malignant neoplasm of upper-outer quadrant of left breast in female, estrogen receptor positive (HCC) -pT1b,cN0M0, G1, ER+/PR+/HER2- -Found on screening mammogram, diagnosed in October 2023 -She underwent left breast lumpectomy, sentinel lymph nodes was not obtained due to the early stage disease and her age. -I discussed her surgical pathology findings, which is very favorable. -She completed adjuvant radiation -She started adjuvant anastrozole  in February 2024 - Diagnostic mammogram due in October 2025 and this was ordered as part of today's visit. -She is clinically doing well.  Breast exam is benign.  Now on Fosamax  weekly for osteoporosis. - Plan for labs and follow-up in 6 months.   Hyponatremia Patient labs indicate hyponatremia sodium level 126.  Her baseline is low with an average of 128 over the last several lab checks.  She is on multiple medications which can lower sodium levels.  We discussed increasing water intake and adding additional salt to her.  To help her not sodium levels.  Will continue to monitor with each lab check.  Mild lymphedema Patient reports mild lymphedema in the left arm.  Has been occurring since surgery.  Has had some physical therapy to help relieve symptoms.  Will use an arm sleeve and glove when needed to reduce swelling.  Osteoporosis Bone density test done in June 2025.  She was found to have osteoporosis.  She is taking Fosamax  70 mg  weekly. She is tolerating this well.  This is managed by her primary care provider.  New bone density should be done in June 2027.  Plan Labs reviewed. - Unremarkable CBC. - Hyponatremia with remainder of CMP unremarkable. Diagnostic mammogram ordered for October 2025. Continue Fosamax  weekly for treatment of osteoporosis. Continue anastrozole  1 mg daily. Labs and follow-up in 6 months, sooner if needed.  The patient understands the plans discussed today and is in agreement with them.  She knows to contact our office if she develops concerns prior to her next appointment.  I provided 25 minutes of face-to-face time during this encounter and > 50% was spent counseling as documented under my assessment and plan.    Powell FORBES Lessen, NP  [facility] - A DEPT OF [facility] [address] [place] [address] Dept: [phone] Dept Fax: [phone]   Orders Placed This Encounter  Procedures   MM 3D DIAGNOSTIC MAMMOGRAM BILATERAL BREAST    HTA Pf;  09/19/2023@bcg  No need- no issues    Standing Status:   Future    Expected Date:   09/25/2024    Expiration Date:   07/11/2025    Reason for Exam (SYMPTOM  OR DIAGNOSIS REQUIRED):   breast cancer follow up    Preferred imaging location?:   [facility]      CHIEF COMPLAINT:  CC: left breast cancer, ER +  Current Treatment:  Anastrozole  1 mg daily   INTERVAL HISTORY:  Kerrilyn is here today for repeat clinical assessment. She last saw Dr. Lanny on 01/10/2024.  She reports mild lymphedema in her left arm.  Uses lymphedema sleeve and glove to help relieve symptoms.  Does massage to help relieve swelling also.  She denies other concerns or complaints.  She denies chest pain, chest pressure, or shortness of breath. She denies headaches or visual disturbances. She denies abdominal pain, nausea, vomiting, or changes in bowel or bladder habits.  She denies fevers or chills. She  denies pain. Her appetite is good. Her weight has decreased 5 pounds over last month.  I have reviewed the past medical history, past surgical history, social history and family history with the patient and they are unchanged from previous note.  ALLERGIES:  is allergic to nefazodone and trazodone .  MEDICATIONS:  Current Outpatient Medications  Medication Sig Dispense Refill   acetaminophen -codeine (TYLENOL  #2) 300-15 MG tablet Take by mouth. (Patient taking differently: Take by mouth. prn)     alendronate  (FOSAMAX ) 70 MG tablet Take 1 tablet (70 mg total) by mouth once a week. Take with a full glass of water on an empty stomach. 12 tablet 3   anastrozole  (ARIMIDEX ) 1 MG tablet Take 1 mg by mouth daily.     anastrozole  (ARIMIDEX ) 1 MG tablet TAKE 1 TABLET BY MOUTH EVERY DAY 90 tablet 1   Ascorbic Acid  (VITAMIN C) 1000 MG tablet Take 1,000 mg by mouth at bedtime.     busPIRone  (BUSPAR ) 15 MG tablet TAKE 1 TABLET BY MOUTH 3 TIMES DAILY. 270 tablet 1   Calcium  Carbonate (CALCIUM  500 PO) Take 1 tablet by mouth at bedtime.     Cholecalciferol  (VITAMIN D -3) 25 MCG (1000 UT) CAPS Take 4,000 Units by mouth at bedtime.     cyanocobalamin  (VITAMIN B12) 1000 MCG/ML injection INJECT 1 ML (1,000 MCG) INTRAMUSCULARLY EVERY 30 DAYS 3 mL 8   diazepam  (VALIUM ) 10 MG tablet Take 0.5-1 tablets (5-10 mg total) by mouth every 8 (eight) hours as needed for anxiety. 90 tablet 1   diclofenac  Sodium (VOLTAREN ) 1 % GEL Apply 2 g topically 4 (four) times daily. 150 g 1   diltiazem  (CARDIZEM ) 30 MG tablet Take 1 tablet every 4 hours AS NEEDED for AFIB heart rate >100 45 tablet 1   diltiazem  (DILT-XR) 180 MG 24 hr capsule TAKE 1 CAPSULE BY MOUTH EVERY DAY 90 capsule 1   ELIQUIS  5 MG TABS tablet TAKE 1 TABLET BY MOUTH TWICE A DAY 60 tablet 5   HYDROcodone -acetaminophen  (NORCO/VICODIN) 5-325 MG tablet Take 2 tablets by mouth every 4 (four) hours as needed. (Patient not taking: Reported on 06/09/2024) 10 tablet 0    lamoTRIgine  (LAMICTAL ) 150 MG tablet Take 2 tablets (300 mg total) by mouth at bedtime. 180 tablet 1   lidocaine  (LIDODERM ) 5 % Place 1 patch onto the skin daily. Remove & Discard patch within 12 hours or as directed by MD (Patient not taking: Reported on 06/09/2024) 30 patch 0   lisinopril  (ZESTRIL ) 20 MG tablet TAKE 1 TABLET BY MOUTH EVERY DAY 90 tablet 1   Magnesium 400 MG CAPS Take 400 mg by mouth at bedtime.     naltrexone  (DEPADE) 50 MG tablet Take 1 tablet (50 mg total) by mouth daily. 30 tablet 1   risperiDONE  (RISPERDAL ) 3 MG tablet Take 1 tablet (3 mg total) by mouth at bedtime. 90 tablet 1   thiamine  (VITAMIN B1) 100 MG tablet Take 100 mg by mouth daily.     traMADol  (ULTRAM ) 50 MG tablet Take 50 mg by mouth every 6 (six) hours as needed. (Patient not taking: Reported on 06/09/2024)  traZODone  (DESYREL ) 50 MG tablet TAKE 1-2 TABLETS (50-100 MG TOTAL) BY MOUTH AT BEDTIME AS NEEDED. FOR SLEEP 180 tablet 1   No current facility-administered medications for this visit.    HISTORY OF PRESENT ILLNESS:   Oncology History  Malignant neoplasm of upper-outer quadrant of left breast in female, estrogen receptor positive (HCC)  09/26/2022 Imaging   CLINICAL DATA:  74 year old female presenting as a recall from screening for possible left breast mass.   EXAM: DIGITAL DIAGNOSTIC UNILATERAL LEFT MAMMOGRAM WITH TOMOSYNTHESIS; ULTRASOUND LEFT BREAST LIMITED  IMPRESSION: Suspicious mass measuring 0.9 cm in the left breast at 2 o'clock.   09/29/2022 Cancer Staging   Staging form: Breast, AJCC 8th Edition - Clinical stage from 09/29/2022: Stage IA (cT1b, cN0, cM0, G2, ER+, PR+, HER2-) - Signed by Lanny Callander, MD on 10/10/2022 Stage prefix: Initial diagnosis Histologic grading system: 3 grade system   09/29/2022 Initial Biopsy   Diagnosis Breast, left, needle core biopsy, 2:00 7 cmfn, ribbon clip - INVASIVE DUCTAL CARCINOMA, SEE NOTE - TUBULE FORMATION: SCORE 3 - NUCLEAR PLEOMORPHISM: SCORE  2 - MITOTIC COUNT: SCORE 1 - TOTAL SCORE: 6 - OVERALL GRADE: 2 - LYMPHOVASCULAR INVASION: NOT IDENTIFIED - CANCER LENGTH: 0.6 CM - CALCIFICATIONS: NOT IDENTIFIED  PROGNOSTIC INDICATORS Results: The tumor cells are negative for Her2 (1+). Estrogen Receptor: 95%, POSITIVE, STRONG STAINING INTENSITY Progesterone Receptor: 100%, POSITIVE, STRONG STAINING INTENSITY Proliferation Marker Ki67: 10%   10/09/2022 Initial Diagnosis   Malignant neoplasm of upper-outer quadrant of left breast in female, estrogen receptor positive (HCC)   11/02/2022 Cancer Staging   Staging form: Breast, AJCC 8th Edition - Pathologic stage from 11/02/2022: Stage Unknown (pT1b, pNX, cM0, G1, ER+, PR+, HER2-) - Signed by Lanny Callander, MD on 01/04/2023 Stage prefix: Initial diagnosis Histologic grading system: 3 grade system Residual tumor (R): R0 - None   12/12/2022 - 01/09/2023 Radiation Therapy   Site Technique Total Dose (Gy) Dose per Fx (Gy) Completed Fx Beam Energies  Breast, Left: Breast_L 3D 42.56/42.56 2.66 16/16 10XFFF  Breast, Left: Breast_L_Bst 3D 8/8 2 4/4 6X, 10X     12/2022 -  Anti-estrogen oral therapy   Anastrozole        REVIEW OF SYSTEMS:   Constitutional: Denies fevers, chills or abnormal weight loss. Fatigue. Eyes: Denies blurriness of vision Ears, nose, mouth, throat, and face: Denies mucositis or sore throat Respiratory: Denies cough, dyspnea or wheezes Cardiovascular: Denies palpitation, chest discomfort or lower extremity swelling Gastrointestinal:  Denies nausea, heartburn or change in bowel habits Skin: Denies abnormal skin rashes Lymphatics: Denies new lymphadenopathy or easy bruising Neurological:Denies numbness, tingling or new weaknesses Behavioral/Psych: Mood is stable, no new changes  All other systems were reviewed with the patient and are negative.   VITALS:   Today's Vitals   07/11/24 1300 07/11/24 1313  BP:  130/74  Pulse:  85  Resp:  17  Temp:  98.4 F (36.9 C)   TempSrc:  Temporal  SpO2:  99%  Weight:  128 lb 9.6 oz (58.3 kg)  Height:  5' 4 (1.626 m)  PainSc: 0-No pain    Body mass index is 22.07 kg/m.   Wt Readings from Last 3 Encounters:  07/11/24 128 lb 9.6 oz (58.3 kg)  05/28/24 133 lb (60.3 kg)  01/16/24 152 lb (68.9 kg)    Body mass index is 22.07 kg/m.  Performance status (ECOG): 1 - Symptomatic but completely ambulatory  PHYSICAL EXAM:   GENERAL:alert, no distress and comfortable SKIN: skin color, texture, turgor are  normal, no rashes or significant lesions EYES: normal, Conjunctiva are pink and non-injected, sclera clear OROPHARYNX:no exudate, no erythema and lips, buccal mucosa, and tongue normal  NECK: supple, thyroid  normal size, non-tender, without nodularity LYMPH:  no palpable lymphadenopathy in the cervical, axillary or inguinal LUNGS: clear to auscultation and percussion with normal breathing effort HEART: regular rate & rhythm and no murmurs and no lower extremity edema ABDOMEN:abdomen soft, non-tender and normal bowel sounds Musculoskeletal:no cyanosis of digits and no clubbing  NEURO: alert & oriented x 3 with fluent speech, no focal motor/sensory deficits BREAST: There is well-healed lumpectomy scar in the left breast. There are no palpable masses or lumps. There is no nipple inversion or nipple discharge.  Expected skin changes noted as a result of radiation therapy.  There is no axillary lymphadenopathy on the left.  The right breast is without lumps or masses today.  There is no nipple inversion or nipple discharge.  There is no axillary lymphadenopathy on the right.  LABORATORY DATA:  I have reviewed the data as listed    Component Value Date/Time   NA 126 (L) 07/11/2024 1255   NA 136 09/08/2019 0916   K 3.9 07/11/2024 1255   CL 90 (L) 07/11/2024 1255   CO2 28 07/11/2024 1255   GLUCOSE 89 07/11/2024 1255   BUN <5 (L) 07/11/2024 1255   BUN 12 09/08/2019 0916   CREATININE 0.57 07/11/2024 1255   CALCIUM   9.5 07/11/2024 1255   PROT 6.7 07/11/2024 1255   ALBUMIN 4.4 07/11/2024 1255   AST 14 (L) 07/11/2024 1255   ALT 10 07/11/2024 1255   ALKPHOS 88 07/11/2024 1255   BILITOT 0.7 07/11/2024 1255   GFRNONAA >60 07/11/2024 1255   GFRAA 99 09/08/2019 0916     Lab Results  Component Value Date   WBC 5.7 07/11/2024   NEUTROABS 4.0 07/11/2024   HGB 12.9 07/11/2024   HCT 38.1 07/11/2024   MCV 86.0 07/11/2024   PLT 235 07/11/2024

## 2024-07-11 ENCOUNTER — Inpatient Hospital Stay: Payer: PPO | Attending: Hematology

## 2024-07-11 ENCOUNTER — Inpatient Hospital Stay: Payer: PPO | Admitting: Nurse Practitioner

## 2024-07-11 VITALS — BP 130/74 | HR 85 | Temp 98.4°F | Resp 17 | Ht 64.0 in | Wt 128.6 lb

## 2024-07-11 DIAGNOSIS — Z17411 Hormone receptor positive with human epidermal growth factor receptor 2 negative status: Secondary | ICD-10-CM | POA: Diagnosis not present

## 2024-07-11 DIAGNOSIS — Z7983 Long term (current) use of bisphosphonates: Secondary | ICD-10-CM | POA: Diagnosis not present

## 2024-07-11 DIAGNOSIS — M7989 Other specified soft tissue disorders: Secondary | ICD-10-CM | POA: Diagnosis not present

## 2024-07-11 DIAGNOSIS — Z170 Estrogen receptor positive status [ER+]: Secondary | ICD-10-CM

## 2024-07-11 DIAGNOSIS — E871 Hypo-osmolality and hyponatremia: Secondary | ICD-10-CM | POA: Diagnosis not present

## 2024-07-11 DIAGNOSIS — Z79811 Long term (current) use of aromatase inhibitors: Secondary | ICD-10-CM | POA: Insufficient documentation

## 2024-07-11 DIAGNOSIS — C50412 Malignant neoplasm of upper-outer quadrant of left female breast: Secondary | ICD-10-CM

## 2024-07-11 DIAGNOSIS — Z79899 Other long term (current) drug therapy: Secondary | ICD-10-CM | POA: Diagnosis not present

## 2024-07-11 DIAGNOSIS — I890 Lymphedema, not elsewhere classified: Secondary | ICD-10-CM | POA: Diagnosis not present

## 2024-07-11 DIAGNOSIS — Z7901 Long term (current) use of anticoagulants: Secondary | ICD-10-CM | POA: Diagnosis not present

## 2024-07-11 DIAGNOSIS — M810 Age-related osteoporosis without current pathological fracture: Secondary | ICD-10-CM | POA: Diagnosis not present

## 2024-07-11 DIAGNOSIS — Z923 Personal history of irradiation: Secondary | ICD-10-CM | POA: Insufficient documentation

## 2024-07-11 LAB — CBC WITH DIFFERENTIAL (CANCER CENTER ONLY)
Abs Immature Granulocytes: 0.02 K/uL (ref 0.00–0.07)
Basophils Absolute: 0 K/uL (ref 0.0–0.1)
Basophils Relative: 0 %
Eosinophils Absolute: 0.1 K/uL (ref 0.0–0.5)
Eosinophils Relative: 2 %
HCT: 38.1 % (ref 36.0–46.0)
Hemoglobin: 12.9 g/dL (ref 12.0–15.0)
Immature Granulocytes: 0 %
Lymphocytes Relative: 19 %
Lymphs Abs: 1.1 K/uL (ref 0.7–4.0)
MCH: 29.1 pg (ref 26.0–34.0)
MCHC: 33.9 g/dL (ref 30.0–36.0)
MCV: 86 fL (ref 80.0–100.0)
Monocytes Absolute: 0.5 K/uL (ref 0.1–1.0)
Monocytes Relative: 9 %
Neutro Abs: 4 K/uL (ref 1.7–7.7)
Neutrophils Relative %: 70 %
Platelet Count: 235 K/uL (ref 150–400)
RBC: 4.43 MIL/uL (ref 3.87–5.11)
RDW: 13 % (ref 11.5–15.5)
WBC Count: 5.7 K/uL (ref 4.0–10.5)
nRBC: 0 % (ref 0.0–0.2)

## 2024-07-11 LAB — CMP (CANCER CENTER ONLY)
ALT: 10 U/L (ref 0–44)
AST: 14 U/L — ABNORMAL LOW (ref 15–41)
Albumin: 4.4 g/dL (ref 3.5–5.0)
Alkaline Phosphatase: 88 U/L (ref 38–126)
Anion gap: 8 (ref 5–15)
BUN: <5 mg/dL — ABNORMAL LOW (ref 8–23)
CO2: 28 mmol/L (ref 22–32)
Calcium: 9.5 mg/dL (ref 8.9–10.3)
Chloride: 90 mmol/L — ABNORMAL LOW (ref 98–111)
Creatinine: 0.57 mg/dL (ref 0.44–1.00)
GFR, Estimated: >60 mL/min (ref >60)
Glucose, Bld: 89 mg/dL (ref 70–99)
Potassium: 3.9 mmol/L (ref 3.5–5.1)
Sodium: 126 mmol/L — ABNORMAL LOW (ref 135–145)
Total Bilirubin: 0.7 mg/dL (ref 0.0–1.2)
Total Protein: 6.7 g/dL (ref 6.5–8.1)

## 2024-07-14 ENCOUNTER — Other Ambulatory Visit: Payer: Self-pay | Admitting: Cardiology

## 2024-07-14 ENCOUNTER — Telehealth: Payer: Self-pay | Admitting: Nurse Practitioner

## 2024-07-14 NOTE — Telephone Encounter (Signed)
 Scheduled patient appointments per 8/15 LOS. Spoke with patient regarding her appointment information, she is aware.

## 2024-07-20 ENCOUNTER — Encounter: Payer: Self-pay | Admitting: Nurse Practitioner

## 2024-07-25 ENCOUNTER — Telehealth: Payer: Self-pay | Admitting: Physician Assistant

## 2024-07-25 NOTE — Telephone Encounter (Signed)
 You're right, I don't treat pain.

## 2024-07-25 NOTE — Telephone Encounter (Addendum)
 Pt reporting her back pain is now an 8 and her pain management doctors told her you should treat her pain due to her MH medications. I see from your last note that you mentioned decreasing the Valium  if she needed more pain medications. She said she discussed pain management at last visit, but I think what she is expecting is different than what was discussed since you don't prescribe opiates. Please advise.

## 2024-07-25 NOTE — Telephone Encounter (Signed)
 Pt called at 4:06p.  She said that her back pain is back.  She said her pain management doctor says Verneita is the one to treat her because of her psych meds.  She said that her pain level is at an 8. She called for an earlier appt, but there aren't any.  She has been added to the wait list.  She said she discussed this issue with Verneita at their last visit.  Next appt 9/15

## 2024-07-29 NOTE — Telephone Encounter (Signed)
 From 7/14 visit:  We discussed the benzodiazepine with opiates.  If she ends up needing opiates more routinely, we may need to decrease or even stop the Valium .  Patient states she Will give up valium  if she has to choose between the 2.  Pain control is very important to her.   Pt is asking to adjust her benzo dosing so that she can get stronger pain medications from her pain management provider. Has FU 9/15.

## 2024-07-29 NOTE — Telephone Encounter (Signed)
 How is she taking the Valium ? It's 10 mg,  can be up to tid. She can decrease it by 5 mg daily for 2 weeks, then another 5 mg for 2 weeks, etc. Until she's off, or pain management is ok w/ whatever dose she's on and can still treat the back pain with opiates.

## 2024-07-30 NOTE — Telephone Encounter (Signed)
 Reviewed recommendations for weaning diazepam  with patient.

## 2024-07-31 ENCOUNTER — Encounter: Payer: Self-pay | Admitting: Cardiology

## 2024-08-03 ENCOUNTER — Other Ambulatory Visit: Payer: Self-pay | Admitting: Physician Assistant

## 2024-08-11 ENCOUNTER — Encounter: Payer: Self-pay | Admitting: Physician Assistant

## 2024-08-11 ENCOUNTER — Ambulatory Visit: Admitting: Physician Assistant

## 2024-08-11 DIAGNOSIS — F411 Generalized anxiety disorder: Secondary | ICD-10-CM | POA: Diagnosis not present

## 2024-08-11 DIAGNOSIS — G4700 Insomnia, unspecified: Secondary | ICD-10-CM | POA: Diagnosis not present

## 2024-08-11 DIAGNOSIS — F319 Bipolar disorder, unspecified: Secondary | ICD-10-CM

## 2024-08-11 MED ORDER — NALTREXONE HCL 50 MG PO TABS: 50.0000 mg | ORAL_TABLET | Freq: Every day | ORAL | 5 refills | Status: AC

## 2024-08-11 NOTE — Progress Notes (Signed)
 [facility]  Patient ID: Margaret Mckenzie,  MRN: [id number]  PCP: Theophilus Andrews, Tully GRADE, MD  Date of Evaluation: 08/11/2024 Time spent:20 minutes  Chief Complaint:  Chief Complaint   Anxiety; Depression; Insomnia    HISTORY/CURRENT STATUS: HPI  For routine med check  She weaned off the Valium , last dose was about 3 weeks ago. She was only taking it a few times a month anyway, she is needing the opiate more often for back pain.  She will be seeing a neurosurgeon next week.  The hydrocodone  does not seem to be helping at all.  Has had no alcohol in several years.  She takes the naltrexone  at night and has had no cravings.  She feels like her medications are working well.  Anxiety is controlled.  Energy and motivation are fair, but it is mostly decreased because of pain.  Work is going okay.  It is hard for her to get through her 4-hour shift because of the back pain.  No extreme sadness, tearfulness, or feelings of hopelessness.  Sleeps well most of the time. ADLs and personal hygiene are normal.   Denies any changes in concentration, making decisions, or remembering things.  Appetite has not changed.  Weight is stable.  No mania, delirium, AH/VH.  No SI/HI.  Individual Medical History/ Review of Systems: Changes? :Yes see HPI  Past medications for mental health diagnoses include: Depakote, Lamictal , Prozac, Lexapro, Xanax , Sonata caused amnesia, Risperdal , Trazodone  ineffective at low doses but caused dizziness at higher doses.  Librium  taper for detox, toward the end of the treatment she had issues with balance.  Acamprosate , questionably caused imbalance.  Allergies: Nefazodone and Trazodone   Current Medications:  Current Outpatient Medications:    alendronate  (FOSAMAX ) 70 MG tablet, Take 1 tablet (70 mg total) by mouth once a week. Take with a full glass of water on an empty stomach., Disp: 12 tablet, Rfl: 3   anastrozole  (ARIMIDEX ) 1 MG tablet, Take 1 mg by  mouth daily., Disp: , Rfl:    anastrozole  (ARIMIDEX ) 1 MG tablet, TAKE 1 TABLET BY MOUTH EVERY DAY, Disp: 90 tablet, Rfl: 1   busPIRone  (BUSPAR ) 15 MG tablet, TAKE 1 TABLET BY MOUTH 3 TIMES DAILY., Disp: 270 tablet, Rfl: 1   Calcium  Carbonate (CALCIUM  500 PO), Take 1 tablet by mouth at bedtime., Disp: , Rfl:    diclofenac  Sodium (VOLTAREN ) 1 % GEL, Apply 2 g topically 4 (four) times daily., Disp: 150 g, Rfl: 1   diltiazem  (CARDIZEM ) 30 MG tablet, Take 1 tablet every 4 hours AS NEEDED for AFIB heart rate >100, Disp: 45 tablet, Rfl: 1   ELIQUIS  5 MG TABS tablet, TAKE 1 TABLET BY MOUTH TWICE A DAY, Disp: 60 tablet, Rfl: 5   lamoTRIgine  (LAMICTAL ) 150 MG tablet, Take 2 tablets (300 mg total) by mouth at bedtime., Disp: 180 tablet, Rfl: 1   lisinopril  (ZESTRIL ) 20 MG tablet, TAKE 1 TABLET BY MOUTH EVERY DAY, Disp: 90 tablet, Rfl: 1   Magnesium 400 MG CAPS, Take 400 mg by mouth at bedtime., Disp: , Rfl:    risperiDONE  (RISPERDAL ) 3 MG tablet, Take 1 tablet (3 mg total) by mouth at bedtime., Disp: 90 tablet, Rfl: 1   thiamine  (VITAMIN B1) 100 MG tablet, Take 100 mg by mouth daily., Disp: , Rfl:    traZODone  (DESYREL ) 50 MG tablet, TAKE 1-2 TABLETS (50-100 MG TOTAL) BY MOUTH AT BEDTIME AS NEEDED. FOR SLEEP, Disp: 180 tablet, Rfl: 1   acetaminophen -codeine (TYLENOL  #2)  300-15 MG tablet, Take by mouth. (Patient taking differently: Take by mouth. prn), Disp: , Rfl:    Ascorbic Acid  (VITAMIN C) 1000 MG tablet, Take 1,000 mg by mouth at bedtime., Disp: , Rfl:    Cholecalciferol  (VITAMIN D -3) 25 MCG (1000 UT) CAPS, Take 4,000 Units by mouth at bedtime., Disp: , Rfl:    cyanocobalamin  (VITAMIN B12) 1000 MCG/ML injection, INJECT 1 ML (1,000 MCG) INTRAMUSCULARLY EVERY 30 DAYS, Disp: 3 mL, Rfl: 8   diltiazem  (DILT-XR) 180 MG 24 hr capsule, TAKE 1 CAPSULE BY MOUTH EVERY DAY, Disp: 90 capsule, Rfl: 1   HYDROcodone -acetaminophen  (NORCO/VICODIN) 5-325 MG tablet, Take 2 tablets by mouth every 4 (four) hours as needed.  (Patient not taking: Reported on 06/09/2024), Disp: 10 tablet, Rfl: 0   lidocaine  (LIDODERM ) 5 %, Place 1 patch onto the skin daily. Remove & Discard patch within 12 hours or as directed by MD (Patient not taking: Reported on 06/09/2024), Disp: 30 patch, Rfl: 0   naltrexone  (DEPADE) 50 MG tablet, Take 1 tablet (50 mg total) by mouth daily., Disp: 30 tablet, Rfl: 5   traMADol  (ULTRAM ) 50 MG tablet, Take 50 mg by mouth every 6 (six) hours as needed. (Patient not taking: Reported on 06/09/2024), Disp: , Rfl:  Medication Side Effects: none  Family Medical/ Social History: Changes?  No  MENTAL HEALTH EXAM:  There were no vitals taken for this visit.There is no height or weight on file to calculate BMI.  General Appearance: Casual, Neat, and Well Groomed  Eye Contact:  Good  Speech:  Clear and Coherent and Normal Rate  Volume:  Normal  Mood:  Euthymic  Affect:  Congruent  Thought Process:  Goal Directed and Descriptions of Associations: Circumstantial  Orientation:  Full (Time, Place, and Person)  Thought Content: Logical   Suicidal Thoughts:  No  Homicidal Thoughts:  No  Memory:  WNL  Judgement:  Good  Insight:  Good  Psychomotor Activity:  Walks slowly with slight leaning forward  Concentration:  Concentration: Good and Attention Span: Good  Recall:  Good  Fund of Knowledge: Good  Language: Good  Assets:  Communication Skills Desire for Improvement Financial Resources/Insurance Housing Resilience Transportation Vocational/Educational  ADL's:  Intact  Cognition: WNL  Prognosis:  Good   PCP follows labs  DIAGNOSES:    ICD-10-CM   1. Bipolar I disorder (HCC)  F31.9     2. Generalized anxiety disorder  F41.1     3. Insomnia, unspecified type  G47.00       Receiving Psychotherapy: Yes   Matt Schooler  RECOMMENDATIONS:  PDMP was reviewed.  Last Valium  filled 06/09/2024.  Hydrocodone  filled 08/06/2024. I provided approximately 20 minutes of face to face time during this  encounter, including time spent before and after the visit in records review, medical decision making, counseling pertinent to today's visit, and charting.   We discussed the chronic pain.  I am glad she was able to get off the Valium  without any withdrawals.  That can be added back in if pain management recommends it since it can be helpful as a muscle relaxer to.  We discussed the naltrexone .  She is taking it at night, approximately 12 hours before she takes hydrocodone .  I have asked her to discuss with the prescriber giving her hydrocodone , whether this is an issue or not.  She has not had any alcohol in several years and I think it would be safe to stop the naltrexone  if it would make the opiate  more effective.  Continue BuSpar  15 mg, 1 p.o. 3 times daily.  (Can increase by phone if needed.) Continue Lamictal  150 mg, 2 p.o. nightly. Continue naltrexone  50 mg, 1 p.o. nightly.                                   Continue Risperdal  3 mg, 1 p.o. nightly. Continue trazodone  50 mg, 1/2-2 p.o. nightly as needed sleep. Continue vitamins as per med list.    Continue therapy with Adina Sol.  Return in 6 months.  Verneita Cooks, PA-C

## 2024-08-12 ENCOUNTER — Encounter: Payer: Self-pay | Admitting: Internal Medicine

## 2024-08-15 DIAGNOSIS — F17210 Nicotine dependence, cigarettes, uncomplicated: Secondary | ICD-10-CM | POA: Diagnosis not present

## 2024-08-15 DIAGNOSIS — Z131 Encounter for screening for diabetes mellitus: Secondary | ICD-10-CM | POA: Diagnosis not present

## 2024-08-15 DIAGNOSIS — D539 Nutritional anemia, unspecified: Secondary | ICD-10-CM | POA: Diagnosis not present

## 2024-08-15 DIAGNOSIS — M129 Arthropathy, unspecified: Secondary | ICD-10-CM | POA: Diagnosis not present

## 2024-08-15 DIAGNOSIS — R0683 Snoring: Secondary | ICD-10-CM | POA: Diagnosis not present

## 2024-08-15 DIAGNOSIS — M5134 Other intervertebral disc degeneration, thoracic region: Secondary | ICD-10-CM | POA: Diagnosis not present

## 2024-08-15 DIAGNOSIS — E559 Vitamin D deficiency, unspecified: Secondary | ICD-10-CM | POA: Diagnosis not present

## 2024-08-15 DIAGNOSIS — R5383 Other fatigue: Secondary | ICD-10-CM | POA: Diagnosis not present

## 2024-08-15 DIAGNOSIS — Z79899 Other long term (current) drug therapy: Secondary | ICD-10-CM | POA: Diagnosis not present

## 2024-08-15 DIAGNOSIS — Z1159 Encounter for screening for other viral diseases: Secondary | ICD-10-CM | POA: Diagnosis not present

## 2024-08-15 DIAGNOSIS — R0602 Shortness of breath: Secondary | ICD-10-CM | POA: Diagnosis not present

## 2024-08-15 DIAGNOSIS — E7800 Pure hypercholesterolemia, unspecified: Secondary | ICD-10-CM | POA: Diagnosis not present

## 2024-08-16 ENCOUNTER — Other Ambulatory Visit: Payer: Self-pay | Admitting: Physician Assistant

## 2024-08-19 DIAGNOSIS — M47816 Spondylosis without myelopathy or radiculopathy, lumbar region: Secondary | ICD-10-CM | POA: Diagnosis not present

## 2024-08-19 DIAGNOSIS — M5416 Radiculopathy, lumbar region: Secondary | ICD-10-CM | POA: Diagnosis not present

## 2024-08-20 DIAGNOSIS — Z79899 Other long term (current) drug therapy: Secondary | ICD-10-CM | POA: Diagnosis not present

## 2024-08-20 DIAGNOSIS — M5134 Other intervertebral disc degeneration, thoracic region: Secondary | ICD-10-CM | POA: Diagnosis not present

## 2024-08-21 DIAGNOSIS — M48061 Spinal stenosis, lumbar region without neurogenic claudication: Secondary | ICD-10-CM | POA: Diagnosis not present

## 2024-08-21 DIAGNOSIS — M4186 Other forms of scoliosis, lumbar region: Secondary | ICD-10-CM | POA: Diagnosis not present

## 2024-08-21 DIAGNOSIS — C50919 Malignant neoplasm of unspecified site of unspecified female breast: Secondary | ICD-10-CM | POA: Diagnosis not present

## 2024-08-21 DIAGNOSIS — M47816 Spondylosis without myelopathy or radiculopathy, lumbar region: Secondary | ICD-10-CM | POA: Diagnosis not present

## 2024-08-21 DIAGNOSIS — M5126 Other intervertebral disc displacement, lumbar region: Secondary | ICD-10-CM | POA: Diagnosis not present

## 2024-09-11 ENCOUNTER — Ambulatory Visit: Admitting: Cardiology

## 2024-09-19 ENCOUNTER — Other Ambulatory Visit: Payer: Self-pay | Admitting: Cardiology

## 2024-09-19 ENCOUNTER — Encounter

## 2024-09-19 DIAGNOSIS — I480 Paroxysmal atrial fibrillation: Secondary | ICD-10-CM

## 2024-09-19 NOTE — Telephone Encounter (Signed)
 Eliquis  5mg  refill request received. Patient is 73 years old, weight-58.3kg, Crea-0.57 on 07/11/24, Diagnosis-Afib, and last seen by Dr. Lavona on 08/17/23 and pending appt on 11/05/24. Dose is appropriate based on dosing criteria. Will send in refill to requested pharmacy.

## 2024-10-03 DIAGNOSIS — H2513 Age-related nuclear cataract, bilateral: Secondary | ICD-10-CM | POA: Diagnosis not present

## 2024-10-10 ENCOUNTER — Other Ambulatory Visit: Payer: Self-pay | Admitting: Physician Assistant

## 2024-10-15 DIAGNOSIS — H2513 Age-related nuclear cataract, bilateral: Secondary | ICD-10-CM | POA: Diagnosis not present

## 2024-11-04 NOTE — Progress Notes (Unsigned)
  Cardiology Office Note:   Date:  11/04/2024  ID:  [name], [name] 07/17/1951, MRN [id number] PCP: Theophilus Andrews, Tully GRADE, MD  [facility] Providers Cardiologist:  Lynwood Schilling, MD {  History of Present Illness:   Margaret Mckenzie is a 73 y.o. female who presents for evaluation of atrial fib.  She was noted to be in this in her PCP office in July of 2018.  She as been seen in the Afib Clinic.  She did have an echocardiogram and this demonstrated some LVH and was otherwise unremarkable.  She was in the ED in Dec 2019 for PAF.  This has progressed to chronic atrial fib   Since I last saw her ***   ***  her she has had no new cardiovascular complaints. The patient denies any new symptoms such as chest discomfort, neck or arm discomfort. There has been no new shortness of breath, PND or orthopnea. There have been no reported palpitations, presyncope or syncope.    ROS: ***  EKG:       ***  Risk Assessment/Calculations:   {Does this patient have ATRIAL FIBRILLATION?[phone]} No BP recorded.  {Refresh Note OR Click here to enter BP  :1}***        Physical Exam:   VS:  There were no vitals taken for this visit.   Wt Readings from Last 3 Encounters:  07/11/24 128 lb 9.6 oz (58.3 kg)  05/28/24 133 lb (60.3 kg)  01/16/24 152 lb (68.9 kg)     GEN: Well nourished, well developed in no acute distress NECK: No JVD; No carotid bruits CARDIAC: ***RR, *** murmurs, rubs, gallops RESPIRATORY:  Clear to auscultation without rales, wheezing or rhonchi  ABDOMEN: Soft, non-tender, non-distended EXTREMITIES:  No edema; No deformity   ASSESSMENT AND PLAN:   ATRIAL FIB.  [name] has a CHA2DS2 - VASc score of 3.  ***  She tolerates anticoagulation.  No change in therapy.   HTN:  The blood pressure is ***  elevated but she is having a panic attack today.  She says it is usually well-controlled at home and she will keep a blood pressure diary and  let me know.  If it is elevated consistently at home I will dose adjust her meds.     Follow up ***  Signed, Lynwood Schilling, MD

## 2024-11-05 ENCOUNTER — Encounter: Payer: Self-pay | Admitting: Cardiology

## 2024-11-05 ENCOUNTER — Ambulatory Visit: Attending: Cardiology | Admitting: Cardiology

## 2024-11-05 VITALS — BP 132/80 | HR 87 | Ht 64.0 in | Wt 121.0 lb

## 2024-11-05 DIAGNOSIS — I482 Chronic atrial fibrillation, unspecified: Secondary | ICD-10-CM

## 2024-11-05 DIAGNOSIS — I10 Essential (primary) hypertension: Secondary | ICD-10-CM | POA: Diagnosis not present

## 2024-11-05 NOTE — Patient Instructions (Signed)
 Medication Instructions:  Your physician recommends that you continue on your current medications as directed. Please refer to the Current Medication list given to you today.  *If you need a refill on your cardiac medications before your next appointment, please call your pharmacy*  Lab Work: None ordered.  If you have labs (blood work) drawn today and your tests are completely normal, you will receive your results only by: MyChart Message (if you have MyChart) OR A paper copy in the mail If you have any lab test that is abnormal or we need to change your treatment, we will call you to review the results.  Testing/Procedures: None ordered.   Follow-Up: At [facility], you and your health needs are our priority.  As part of our continuing mission to provide you with exceptional heart care, our providers are all part of one team.  This team includes your primary Cardiologist (physician) and Advanced Practice Providers or APPs (Physician Assistants and Nurse Practitioners) who all work together to provide you with the care you need, when you need it.  Your next appointment:   18 months with Dr Lavona

## 2024-11-07 DIAGNOSIS — H25812 Combined forms of age-related cataract, left eye: Secondary | ICD-10-CM | POA: Diagnosis not present

## 2024-11-10 ENCOUNTER — Other Ambulatory Visit: Payer: Self-pay | Admitting: Nurse Practitioner

## 2024-11-10 ENCOUNTER — Ambulatory Visit
Admission: RE | Admit: 2024-11-10 | Discharge: 2024-11-10 | Disposition: A | Source: Ambulatory Visit | Attending: Nurse Practitioner

## 2024-11-10 DIAGNOSIS — C50412 Malignant neoplasm of upper-outer quadrant of left female breast: Secondary | ICD-10-CM

## 2024-11-10 DIAGNOSIS — R928 Other abnormal and inconclusive findings on diagnostic imaging of breast: Secondary | ICD-10-CM

## 2024-11-18 ENCOUNTER — Other Ambulatory Visit: Payer: Self-pay | Admitting: Internal Medicine

## 2024-11-18 DIAGNOSIS — I10 Essential (primary) hypertension: Secondary | ICD-10-CM

## 2024-12-03 ENCOUNTER — Other Ambulatory Visit: Payer: Self-pay | Admitting: Hematology

## 2024-12-30 ENCOUNTER — Ambulatory Visit: Admitting: Family Medicine

## 2024-12-30 DIAGNOSIS — Z122 Encounter for screening for malignant neoplasm of respiratory organs: Secondary | ICD-10-CM | POA: Diagnosis not present

## 2024-12-30 DIAGNOSIS — Z87891 Personal history of nicotine dependence: Secondary | ICD-10-CM | POA: Diagnosis not present

## 2024-12-30 DIAGNOSIS — Z0000 Encounter for general adult medical examination without abnormal findings: Secondary | ICD-10-CM | POA: Diagnosis not present

## 2025-01-02 ENCOUNTER — Telehealth: Payer: Self-pay

## 2025-01-02 DIAGNOSIS — Z122 Encounter for screening for malignant neoplasm of respiratory organs: Secondary | ICD-10-CM

## 2025-01-02 DIAGNOSIS — Z87891 Personal history of nicotine dependence: Secondary | ICD-10-CM

## 2025-01-02 NOTE — Telephone Encounter (Signed)
 Lung Cancer Screening Narrative/Criteria Questionnaire (Cigarette Smokers Only- No Cigars/Pipes/vapes)   Margaret Mckenzie   SDMV:01/13/2025 at 1:00 pm  [name]        [birth year]                           LDCT: 01/15/2025 at 2:30 pm GI    73 y.o.   Phone: [phone]  Lung Screening Narrative (confirm age 34-77 yrs Medicare / 50-80 yrs Private pay insurance)   Insurance information: [employer]    Referring Provider: Theophilus, MD   This screening involves an initial phone call with a team member from our program. It is called a shared decision making visit. The initial meeting is required by  insurance and Medicare to make sure you understand the program. This appointment takes about 15-20 minutes to complete. You will complete the screening scan at your scheduled date/time.  This scan takes about 5-10 minutes to complete. You can eat and drink normally before and after the scan.  Criteria questions for Lung Cancer Screening:   Are you a current or former smoker? Former Age began smoking: 22   If you are a former smoker, what year did you quit smoking? Quit 2021 (within 15 yrs)   To calculate your smoking history, I need an accurate estimate of how many packs of cigarettes you smoked per day and for how many years. (Not just the number of PPD you are now smoking)   Years smoking 46 x Packs per day 1 = Pack years 46   (at least 20 pack yrs)   (Make sure they understand that we need to know how much they have smoked in the past, not just the number of PPD they are smoking now)  Do you have a personal history of cancer?  Yes - (type and when diagnosed - 5 yrs cancer free) Breast Cancer 2023 only take oral medication now.     Do you have a family history of cancer? No  Are you coughing up blood?  No  Have you had unexplained weight loss of 15 lbs or more in the last 6 months? No  It looks like you meet all criteria.  When would be a good time for us  to schedule you for this  screening?   Additional information: N/A

## 2025-01-13 ENCOUNTER — Encounter

## 2025-01-14 ENCOUNTER — Inpatient Hospital Stay

## 2025-01-14 ENCOUNTER — Inpatient Hospital Stay: Admitting: Nurse Practitioner

## 2025-01-15 ENCOUNTER — Other Ambulatory Visit

## 2025-02-09 ENCOUNTER — Ambulatory Visit: Admitting: Physician Assistant

## 2025-05-12 ENCOUNTER — Encounter
